# Patient Record
Sex: Male | Born: 1940
Health system: Southern US, Community
[De-identification: ages and names within clinical notes are randomized; demographics above are authoritative.]

## PROBLEM LIST (undated history)

## (undated) DIAGNOSIS — I10 Essential (primary) hypertension: Secondary | ICD-10-CM

## (undated) DIAGNOSIS — E785 Hyperlipidemia, unspecified: Secondary | ICD-10-CM

## (undated) DIAGNOSIS — I639 Cerebral infarction, unspecified: Secondary | ICD-10-CM

## (undated) DIAGNOSIS — R0683 Snoring: Secondary | ICD-10-CM

## (undated) DIAGNOSIS — I4891 Unspecified atrial fibrillation: Secondary | ICD-10-CM

## (undated) DIAGNOSIS — Z9289 Personal history of other medical treatment: Secondary | ICD-10-CM

## (undated) DIAGNOSIS — C44211 Basal cell carcinoma of skin of unspecified ear and external auricular canal: Secondary | ICD-10-CM

## (undated) DIAGNOSIS — G459 Transient cerebral ischemic attack, unspecified: Secondary | ICD-10-CM

## (undated) DIAGNOSIS — I499 Cardiac arrhythmia, unspecified: Secondary | ICD-10-CM

## (undated) DIAGNOSIS — I4892 Unspecified atrial flutter: Secondary | ICD-10-CM

## (undated) DIAGNOSIS — I2510 Atherosclerotic heart disease of native coronary artery without angina pectoris: Secondary | ICD-10-CM

## (undated) DIAGNOSIS — IMO0001 Reserved for inherently not codable concepts without codable children: Secondary | ICD-10-CM

## (undated) DIAGNOSIS — M1990 Unspecified osteoarthritis, unspecified site: Secondary | ICD-10-CM

## (undated) HISTORY — DX: Hyperlipidemia, unspecified: E78.5

## (undated) HISTORY — DX: Transient cerebral ischemic attack, unspecified: G45.9

## (undated) HISTORY — DX: Essential (primary) hypertension: I10

## (undated) HISTORY — DX: Unspecified atrial fibrillation: I48.91

## (undated) HISTORY — DX: Basal cell carcinoma of skin of unspecified ear and external auricular canal: C44.211

## (undated) HISTORY — PX: COLONOSCOPY: SHX174

## (undated) HISTORY — PX: CATARACT EXTRACTION, BILATERAL: SHX1313

## (undated) HISTORY — PX: OTHER SURGICAL HISTORY: SHX169

## (undated) HISTORY — DX: Unspecified atrial flutter: I48.92

## (undated) HISTORY — PX: KNEE ARTHROSCOPY: SUR90

## (undated) HISTORY — DX: Personal history of other medical treatment: Z92.89

## (undated) HISTORY — DX: Atherosclerotic heart disease of native coronary artery without angina pectoris: I25.10

## (undated) HISTORY — PX: EYE SURGERY: SHX253

## (undated) HISTORY — PX: HEMORRHOID SURGERY: SHX153

## (undated) HISTORY — DX: Snoring: R06.83

---

## 2001-05-30 ENCOUNTER — Encounter: Admission: RE | Admit: 2001-05-30 | Discharge: 2001-05-30 | Payer: Self-pay | Admitting: Family Medicine

## 2001-05-30 ENCOUNTER — Encounter: Payer: Self-pay | Admitting: Family Medicine

## 2001-06-04 ENCOUNTER — Ambulatory Visit (HOSPITAL_COMMUNITY): Admission: RE | Admit: 2001-06-04 | Discharge: 2001-06-04 | Payer: Self-pay | Admitting: Family Medicine

## 2004-09-28 ENCOUNTER — Inpatient Hospital Stay (HOSPITAL_COMMUNITY): Admission: EM | Admit: 2004-09-28 | Discharge: 2004-09-30 | Payer: Self-pay | Admitting: Emergency Medicine

## 2004-09-29 ENCOUNTER — Encounter (INDEPENDENT_AMBULATORY_CARE_PROVIDER_SITE_OTHER): Payer: Self-pay | Admitting: Cardiology

## 2005-01-07 ENCOUNTER — Emergency Department (HOSPITAL_COMMUNITY): Admission: EM | Admit: 2005-01-07 | Discharge: 2005-01-07 | Payer: Self-pay | Admitting: Emergency Medicine

## 2007-10-02 ENCOUNTER — Ambulatory Visit (HOSPITAL_COMMUNITY): Admission: RE | Admit: 2007-10-02 | Discharge: 2007-10-02 | Payer: Self-pay | Admitting: Cardiology

## 2008-12-19 ENCOUNTER — Inpatient Hospital Stay (HOSPITAL_COMMUNITY): Admission: EM | Admit: 2008-12-19 | Discharge: 2008-12-23 | Payer: Self-pay | Admitting: Emergency Medicine

## 2008-12-19 ENCOUNTER — Ambulatory Visit: Payer: Self-pay | Admitting: Internal Medicine

## 2008-12-22 ENCOUNTER — Encounter: Payer: Self-pay | Admitting: Cardiovascular Disease

## 2009-02-03 DIAGNOSIS — I10 Essential (primary) hypertension: Secondary | ICD-10-CM | POA: Insufficient documentation

## 2009-02-03 DIAGNOSIS — I480 Paroxysmal atrial fibrillation: Secondary | ICD-10-CM | POA: Insufficient documentation

## 2009-02-03 DIAGNOSIS — I4892 Unspecified atrial flutter: Secondary | ICD-10-CM

## 2009-02-05 ENCOUNTER — Ambulatory Visit: Payer: Self-pay | Admitting: Internal Medicine

## 2009-10-25 ENCOUNTER — Telehealth (INDEPENDENT_AMBULATORY_CARE_PROVIDER_SITE_OTHER): Payer: Self-pay | Admitting: *Deleted

## 2010-01-29 ENCOUNTER — Emergency Department (HOSPITAL_COMMUNITY): Admission: EM | Admit: 2010-01-29 | Discharge: 2010-01-29 | Payer: Self-pay | Admitting: Emergency Medicine

## 2010-06-01 ENCOUNTER — Ambulatory Visit (HOSPITAL_COMMUNITY)
Admission: RE | Admit: 2010-06-01 | Discharge: 2010-06-01 | Payer: Self-pay | Source: Home / Self Care | Attending: Otolaryngology | Admitting: Otolaryngology

## 2010-06-06 LAB — HEPATIC FUNCTION PANEL
ALT: 20 U/L (ref 0–53)
AST: 16 U/L (ref 0–37)
Albumin: 4.3 g/dL (ref 3.5–5.2)
Alkaline Phosphatase: 50 U/L (ref 39–117)
Bilirubin, Direct: 0.2 mg/dL (ref 0.0–0.3)
Indirect Bilirubin: 0.9 mg/dL (ref 0.3–0.9)
Total Bilirubin: 1.1 mg/dL (ref 0.3–1.2)
Total Protein: 7 g/dL (ref 6.0–8.3)

## 2010-06-06 LAB — SURGICAL PCR SCREEN
MRSA, PCR: NEGATIVE
Staphylococcus aureus: NEGATIVE

## 2010-06-06 LAB — BASIC METABOLIC PANEL
BUN: 15 mg/dL (ref 6–23)
BUN: 17 mg/dL (ref 6–23)
CO2: 31 mEq/L (ref 19–32)
CO2: 27 mEq/L (ref 19–32)
Calcium: 10.2 mg/dL (ref 8.4–10.5)
Calcium: 9.5 mg/dL (ref 8.4–10.5)
Chloride: 102 mEq/L (ref 96–112)
Chloride: 102 mEq/L (ref 96–112)
Creatinine, Ser: 1.18 mg/dL (ref 0.4–1.5)
Creatinine, Ser: 1.2 mg/dL (ref 0.4–1.5)
GFR calc Af Amer: >60 mL/min (ref >60)
GFR calc Af Amer: >60 mL/min (ref >60)
GFR calc non Af Amer: >60 mL/min (ref >60)
GFR calc non Af Amer: >60 mL/min (ref >60)
Glucose, Bld: 78 mg/dL (ref 70–99)
Glucose, Bld: 109 mg/dL — ABNORMAL HIGH (ref 70–99)
Potassium: 5.9 mEq/L — ABNORMAL HIGH (ref 3.5–5.1)
Potassium: 3.9 mEq/L (ref 3.5–5.1)
Sodium: 139 mEq/L (ref 135–145)
Sodium: 139 mEq/L (ref 135–145)

## 2010-06-06 LAB — CBC
HCT: 46 % (ref 39.0–52.0)
Hemoglobin: 15.2 g/dL (ref 13.0–17.0)
MCH: 31.9 pg (ref 26.0–34.0)
MCHC: 33 g/dL (ref 30.0–36.0)
MCV: 96.6 fL (ref 78.0–100.0)
Platelets: 263 10*3/uL (ref 150–400)
RBC: 4.76 MIL/uL (ref 4.22–5.81)
RDW: 13 % (ref 11.5–15.5)
WBC: 6.1 10*3/uL (ref 4.0–10.5)

## 2010-06-09 NOTE — Op Note (Signed)
NAMEANTRELL, Erik [name]              ACCOUNT NO.:  [id number]  MEDICAL RECORD NO.:  [id number]          PATIENT TYPE:  AMB  LOCATION:  SDS                          FACILITY:  [facility]  PHYSICIAN:  Zola Button T. Lazarus Salines, M.D. DATE OF BIRTH:  1940/06/01  DATE OF PROCEDURE:  06/01/2010 DATE OF DISCHARGE:  06/01/2010                              OPERATIVE REPORT   PREOPERATIVE DIAGNOSIS:  Basal cell carcinoma, right ear.  POSTOPERATIVE DIAGNOSIS:  Basal cell carcinoma, right ear.  PROCEDURE PERFORMED:  Multiple stage reexcision, basal cell carcinoma, right ear.  SURGEON:  Gloris Manchester. Raliegh Scobie, MD  ANESTHESIA:  General orotracheal.  BLOOD LOSS:  Minimal.  COMPLICATIONS:  None.  FINDINGS:  A flat well-healed area consistent with a prior excision with healing by secondary intention in the inferior right conchal bowl.  At the original excision, positive margins were noted hence the indication for today's re-excision.  A generous re-excision was performed through skin cartilage and perichondrium.  Frozen section margins were sent at 11 o'clock, 8 o'clock and 4 o'clock.  The margins were positive at 11 o'clock and 4 o'clock and re-excision were performed at both of these sites.  A repeat margin at 11 and 4 o'clock was negative at 11 and positive 4.  Another re-excision margin was obtained and additional frozen section margins were obtained at 3 o'clock and 4 o'clock, both of which were negative.  PROCEDURE:  With the patient in a comfortable supine position, general orotracheal anesthesia was induced without difficulty.  At an appropriate level, the patient was placed in a slight sitting position with the head rotated to the left for access to the right ear.  A routine time-out was performed and the identifying initials were noted. Xylocaine 1% with 1:100,000 epinephrine, 4.5 mL total was infiltrated deep to the conchal bowl for intraoperative hemostasis.  Several minutes were allowed for this  to take effect.  A sterile preparation and draping with chlorhexidine soap was performed in the standard fashion.  The findings were as described above.  The visible lesion was excised with approximately 6-mm margin at all directions carried down through skin, perichondrium cartilage and through the second layer perichondrium into the deep soft tissues.  The specimen was tagged with orienting sutures and was removed and sent off for permanent interpretation.  Thin frozen section margins at the skin edges were obtained at the 11 o'clock, 8 o'clock and 4 o'clock positions.  These returned as positive at 11 and 4 o'clock and another 4-5 mm excision margin was taken at both of these areas.  Additional frozen section margins were obtained negative at 11 and once again positive at 4 o'clock.  An additional 3-4 mm was excised in this area and two additional frozen section margins at 3 and 4 o'clock position were sampled and were negative by frozen section.  This completed the excision.  The cartilage edges were trimmed to allow more rapid healing. Electrocautery was used on one small arterial pumper at the anterior inferior conchal bowl and on several small oozing sites.  Hemostasis was observed.  The edges of the wound were tucked together in several areas with  buried 4-0 chromic suture, again to encourage more prompt healing. The final defect was roughly 3 x 3 cm.  Decision was made to go ahead and allow this heal by secondary intention again.  Adaptic gauze was placed against the raw surfaces and completely covered with bacitracin ointment.  A cotton ball was placed on top of this to catch any drainage.  At this point, the procedure was completed.  The patient was returned to Anesthesia, awakened, extubated, and transferred to recovery in stable condition.  COMMENTS:  A 70 year old white male status post a primary excision of a biopsy-proven basal cell carcinoma of the right conchal bowl  in the office.  With positive margins, it was elected to come back to the main OR under general anesthesia, at that time, we would be allowed to do frozen section sampling as needed to achieve a complete excision.  This was accomplished as above.  We will await the final pathologic interpretation.  Once again, we will allow this to heal by secondary intention with routine attention to analgesics and wound hygiene.     Gloris Manchester. Lazarus Salines, M.D.     KTW/MEDQ  D:  06/01/2010  T:  06/02/2010  Job:  [id number]  cc:   L. Lupe Carney, M.D.  Electronically Signed by Flo Shanks M.D. on 06/09/2010 12:11:17 PM

## 2010-06-21 NOTE — Progress Notes (Signed)
  Phone Note From Other Clinic   Caller: Wanda[facility] Initial call taken by: KM    Faxed Ablation over to [phone] [facility]  October 25, 2009 3:15 PM

## 2010-07-19 ENCOUNTER — Encounter: Payer: Self-pay | Admitting: Internal Medicine

## 2010-07-26 ENCOUNTER — Telehealth (INDEPENDENT_AMBULATORY_CARE_PROVIDER_SITE_OTHER): Payer: Self-pay | Admitting: *Deleted

## 2010-07-26 ENCOUNTER — Encounter (HOSPITAL_COMMUNITY)
Admission: RE | Admit: 2010-07-26 | Discharge: 2010-07-26 | Disposition: A | Discharge status: Home / Self Care | Payer: Medicare Other | Source: Ambulatory Visit | Attending: Otolaryngology | Admitting: Otolaryngology

## 2010-07-26 DIAGNOSIS — R9431 Abnormal electrocardiogram [ECG] [EKG]: Secondary | ICD-10-CM | POA: Insufficient documentation

## 2010-07-26 DIAGNOSIS — Z01812 Encounter for preprocedural laboratory examination: Secondary | ICD-10-CM | POA: Insufficient documentation

## 2010-07-26 DIAGNOSIS — Z01810 Encounter for preprocedural cardiovascular examination: Secondary | ICD-10-CM | POA: Insufficient documentation

## 2010-07-26 LAB — COMPREHENSIVE METABOLIC PANEL
ALT: 20 U/L (ref 0–53)
AST: 25 U/L (ref 0–37)
Albumin: 3.8 g/dL (ref 3.5–5.2)
Alkaline Phosphatase: 50 U/L (ref 39–117)
BUN: 19 mg/dL (ref 6–23)
CO2: 28 mEq/L (ref 19–32)
Calcium: 9.5 mg/dL (ref 8.4–10.5)
Chloride: 103 mEq/L (ref 96–112)
Creatinine, Ser: 1.07 mg/dL (ref 0.4–1.5)
GFR calc Af Amer: >60 mL/min (ref >60)
GFR calc non Af Amer: >60 mL/min (ref >60)
Glucose, Bld: 95 mg/dL (ref 70–99)
Potassium: 5.5 mEq/L — ABNORMAL HIGH (ref 3.5–5.1)
Sodium: 138 mEq/L (ref 135–145)
Total Bilirubin: 1.7 mg/dL — ABNORMAL HIGH (ref 0.3–1.2)
Total Protein: 6.8 g/dL (ref 6.0–8.3)

## 2010-07-26 LAB — SURGICAL PCR SCREEN
MRSA, PCR: NEGATIVE
Staphylococcus aureus: NEGATIVE

## 2010-07-26 LAB — CBC
HCT: 42.1 % (ref 39.0–52.0)
Hemoglobin: 14.8 g/dL (ref 13.0–17.0)
MCH: 33.5 pg (ref 26.0–34.0)
MCHC: 35.2 g/dL (ref 30.0–36.0)
MCV: 95.2 fL (ref 78.0–100.0)
Platelets: 269 10*3/uL (ref 150–400)
RBC: 4.42 MIL/uL (ref 4.22–5.81)
RDW: 12.3 % (ref 11.5–15.5)
WBC: 8.4 10*3/uL (ref 4.0–10.5)

## 2010-07-28 ENCOUNTER — Ambulatory Visit (HOSPITAL_COMMUNITY)
Admission: RE | Admit: 2010-07-28 | Discharge: 2010-07-28 | Disposition: A | Discharge status: Home / Self Care | Payer: Medicare Other | Source: Ambulatory Visit | Attending: Otolaryngology | Admitting: Otolaryngology

## 2010-07-28 ENCOUNTER — Other Ambulatory Visit: Payer: Self-pay | Admitting: Otolaryngology

## 2010-07-28 DIAGNOSIS — F1010 Alcohol abuse, uncomplicated: Secondary | ICD-10-CM | POA: Insufficient documentation

## 2010-07-28 DIAGNOSIS — I10 Essential (primary) hypertension: Secondary | ICD-10-CM | POA: Insufficient documentation

## 2010-07-28 DIAGNOSIS — K219 Gastro-esophageal reflux disease without esophagitis: Secondary | ICD-10-CM | POA: Insufficient documentation

## 2010-07-28 DIAGNOSIS — C44211 Basal cell carcinoma of skin of unspecified ear and external auricular canal: Secondary | ICD-10-CM | POA: Insufficient documentation

## 2010-07-28 DIAGNOSIS — I4892 Unspecified atrial flutter: Secondary | ICD-10-CM | POA: Insufficient documentation

## 2010-07-28 LAB — BASIC METABOLIC PANEL
BUN: 16 mg/dL (ref 6–23)
CO2: 27 mEq/L (ref 19–32)
Calcium: 9.6 mg/dL (ref 8.4–10.5)
Chloride: 106 mEq/L (ref 96–112)
Creatinine, Ser: 1.14 mg/dL (ref 0.4–1.5)
GFR calc Af Amer: >60 mL/min (ref >60)
GFR calc non Af Amer: >60 mL/min (ref >60)
Glucose, Bld: 99 mg/dL (ref 70–99)
Potassium: 4.9 mEq/L (ref 3.5–5.1)
Sodium: 139 mEq/L (ref 135–145)

## 2010-08-02 NOTE — Op Note (Signed)
Erik [name], Erik [name]              ACCOUNT NO.:  [id number]  MEDICAL RECORD NO.:  [id number]           PATIENT TYPE:  O  LOCATION:  SDSC                         FACILITY:  [facility]  PHYSICIAN:  Sekai Nayak T. Lazarus Salines, M.D. DATE OF BIRTH:  1940-07-25  DATE OF PROCEDURE:  07/28/2010 DATE OF DISCHARGE:                              OPERATIVE REPORT   PREOPERATIVE DIAGNOSIS:  Residual basal cell carcinoma, right pinna.  POSTOPERATIVE DIAGNOSIS:  Residual basal cell carcinoma, right pinna.  PROCEDURE PERFORMED:  Serial re-excision, complex, right conchal bowl lesion with island flap reconstruction.  One-stage was performed today.  SURGEON:  Gloris Manchester. Cherylin Waguespack, MD  ANESTHESIA:  General orotracheal.  BLOOD LOSS:  Minimal.  COMPLICATIONS:  None.  FINDINGS:  Old scar secondary to secondary intention healing in the inferior conchal bowl.  No obvious tumor.  Frozen sections at the margin of the excision at the anterior and posterior skin margins and in several places at the deep margins, all were read as negative.  The permanent final specimen was sent for permanent interpretation, which is pending.  A through-and-through excision from the conchal bowl to the postauricular skin, closed with an island flap.  PROCEDURE:  With the patient in a comfortable supine position, general orotracheal anesthesia was induced without difficulty.  At an appropriate level, the patient was turned 90 degrees away from anesthesia and placed in a semi-sitting position.  Xylocaine 0.5% with 1:200,000 epinephrine, 8 mL total was infiltrated beneath the conchal bowl for intraoperative anesthesia and hemostasis.  Several minutes were allowed for this to take effect.  A sterile preparation and draping of the right ear was accomplished in the standard fashion.  The previous pathology imaging maps were studied.  The lesion itself was studied with no gross recurrent tumor.  The excision was planned and superior and  inferior skin margins were harvested for frozen section. The excision was carried down just below the tragus across the incisura intertragica up to and including the entire antitragus and down into the flesh of the lobule and carried down into the conchal bowl through the cartilage.  The excision was extended through the posterior skin. Margins were taken deeply at the skin and at the soft tissue anteriorly. The specimen was marked with Vicryl sutures and sent for permanent interpretation.  Upon recognizing that there was a full-thickness excision, the posterior skin surface was trimmed up and skin margins were also sent for frozen section.  There was miscommunication between the surgical suite and the pathology suite and the specimens were not initially processed for frozen section. This was recognized and remedied.  After approximately 1 hour and 15 minutes, frozen sections finally did return as all benign.  Hemostasis was observed at this point.  A preparations were made for closure.  It was felt that the anterior portion was going to be left to heal by secondary intention as it approached the external meatus.  More posteriorly, the skin island was planned on the postauricular skin undermined around its edges.  The skin island was sutured to the anterior surface of the pinna circumferentially approximately 2 cm in diameter.  The postauricular  skin on the pinna was then sewn down to the postauricular skin over the mastoid again with 4-0 chromic suture.  This tethered the inferior conchal bowl to some degree, but a good closure with no perforation was accomplished.  An area approximately 1.5 x 0.5-cm was left to close by secondary intention just outside of the external meatus.  Small amount of cautery was used for hemostasis.  At this point, the procedure was completed.  Bacitracin ointment was applied and the cotton ball was placed in the conchal bowl as well as some bacitracin  ointment posteriorly.  The drapes were removed and the patient was returned to Anesthesia, awakened, extubated, and transferred to recovery in stable condition.  COMMENT:  A 70 year old white male with biopsy-proven basal cell carcinoma, underwent a serial excision 2 months ago going through three stages and finally achieving negative frozen section margins.  However, permanent section margin was returned positive at the 4 o'clock position necessitating today's re-excision.  He has a basically morpheaform basal cell carcinoma.  A single excision today with multiple frozen section controls was accomplished and all frozen sections were negative with the describer as above.  Anticipate routine postoperative recovery with attention to ice, elevation, analgesia, and wound care.  Given low anticipated risk of postanesthetic or postsurgical complications, feeling outpatient venue is appropriate.     Gloris Manchester. Lazarus Salines, M.D.     KTW/MEDQ  D:  07/28/2010  T:  07/29/2010  Job:  [id number]  cc:   Doylene Canning. Ladona Ridgel, MD L. Lupe Carney, M.D.  Electronically Signed by Flo Shanks M.D. on 08/02/2010 08:04:33 AM

## 2010-08-02 NOTE — Letter (Signed)
Summary: [facility]  [facility]   Imported By: Marylou Mccoy 07/29/2010 08:41:28  _____________________________________________________________________  External Attachment:    Type:   Image     Comment:   External Document

## 2010-08-02 NOTE — Progress Notes (Signed)
Summary: Records Request  Faxed OV & EKG to [facility] at [facility] ([phone]). Debby Freiberg  July 26, 2010 12:40 PM

## 2010-08-04 LAB — POCT I-STAT, CHEM 8
BUN: 19 mg/dL (ref 6–23)
Calcium, Ion: 1.16 mmol/L (ref 1.12–1.32)
Chloride: 104 mEq/L (ref 96–112)
Creatinine, Ser: 1.4 mg/dL (ref 0.4–1.5)
Glucose, Bld: 112 mg/dL — ABNORMAL HIGH (ref 70–99)
HCT: 45 % (ref 39.0–52.0)
Hemoglobin: 15.3 g/dL (ref 13.0–17.0)
Potassium: 5 mEq/L (ref 3.5–5.1)
Sodium: 138 mEq/L (ref 135–145)
TCO2: 30 mmol/L (ref 0–100)

## 2010-08-04 LAB — POCT CARDIAC MARKERS
CKMB, poc: 2.4 ng/mL (ref 1.0–8.0)
Myoglobin, poc: 79.3 ng/mL (ref 12–200)
Troponin i, poc: <0.05 ng/mL (ref 0.00–0.09)

## 2010-08-04 LAB — MAGNESIUM: Magnesium: 1.8 mg/dL (ref 1.5–2.5)

## 2010-08-27 LAB — BASIC METABOLIC PANEL
BUN: 18 mg/dL (ref 6–23)
CO2: 30 mEq/L (ref 19–32)
Calcium: 8.7 mg/dL (ref 8.4–10.5)
Chloride: 103 mEq/L (ref 96–112)
Creatinine, Ser: 1.3 mg/dL (ref 0.4–1.5)
GFR calc Af Amer: >60 mL/min (ref >60)
GFR calc non Af Amer: 55 mL/min — ABNORMAL LOW (ref >60)
Glucose, Bld: 112 mg/dL — ABNORMAL HIGH (ref 70–99)
Potassium: 4.8 mEq/L (ref 3.5–5.1)
Sodium: 137 mEq/L (ref 135–145)

## 2010-08-27 LAB — CBC
HCT: 35.5 % — ABNORMAL LOW (ref 39.0–52.0)
HCT: 40.9 % (ref 39.0–52.0)
Hemoglobin: 12.3 g/dL — ABNORMAL LOW (ref 13.0–17.0)
Hemoglobin: 14.5 g/dL (ref 13.0–17.0)
MCHC: 34.8 g/dL (ref 30.0–36.0)
MCHC: 35.4 g/dL (ref 30.0–36.0)
MCV: 98.9 fL (ref 78.0–100.0)
MCV: 99.8 fL (ref 78.0–100.0)
Platelets: 222 10*3/uL (ref 150–400)
Platelets: 275 10*3/uL (ref 150–400)
RBC: 3.56 MIL/uL — ABNORMAL LOW (ref 4.22–5.81)
RBC: 4.14 MIL/uL — ABNORMAL LOW (ref 4.22–5.81)
RDW: 13.2 % (ref 11.5–15.5)
RDW: 13.3 % (ref 11.5–15.5)
WBC: 7.1 10*3/uL (ref 4.0–10.5)
WBC: 7.8 10*3/uL (ref 4.0–10.5)

## 2010-08-27 LAB — CARDIAC PANEL(CRET KIN+CKTOT+MB+TROPI)
CK, MB: 2.7 ng/mL (ref 0.3–4.0)
CK, MB: 2.7 ng/mL (ref 0.3–4.0)
Relative Index: INVALID (ref 0.0–2.5)
Relative Index: INVALID (ref 0.0–2.5)
Total CK: 86 U/L (ref 7–232)
Total CK: 91 U/L (ref 7–232)
Troponin I: 0.01 ng/mL (ref 0.00–0.06)
Troponin I: 0.02 ng/mL (ref 0.00–0.06)

## 2010-08-27 LAB — PROTIME-INR
INR: 1 (ref 0.00–1.49)
Prothrombin Time: 13 seconds (ref 11.6–15.2)

## 2010-08-28 LAB — POCT CARDIAC MARKERS: Troponin i, poc: <0.05 ng/mL (ref 0.00–0.09)

## 2010-08-28 LAB — CBC
HCT: 47.4 % (ref 39.0–52.0)
Hemoglobin: 16.5 g/dL (ref 13.0–17.0)
MCHC: 34.8 g/dL (ref 30.0–36.0)
RDW: 13.5 % (ref 11.5–15.5)

## 2010-08-28 LAB — TSH: TSH: 0.977 u[IU]/mL (ref 0.350–4.500)

## 2010-08-28 LAB — DIFFERENTIAL
Basophils Absolute: 0 10*3/uL (ref 0.0–0.1)
Basophils Relative: 1 % (ref 0–1)
Eosinophils Absolute: 0.1 10*3/uL (ref 0.0–0.7)
Eosinophils Relative: 1 % (ref 0–5)
Monocytes Absolute: 0.5 10*3/uL (ref 0.1–1.0)

## 2010-08-28 LAB — TROPONIN I: Troponin I: 0.02 ng/mL (ref 0.00–0.06)

## 2010-08-28 LAB — BASIC METABOLIC PANEL
BUN: 17 mg/dL (ref 6–23)
CO2: 25 mEq/L (ref 19–32)
Calcium: 9.3 mg/dL (ref 8.4–10.5)
Chloride: 101 mEq/L (ref 96–112)
Creatinine, Ser: 1.08 mg/dL (ref 0.4–1.5)
GFR calc Af Amer: >60 mL/min (ref >60)
GFR calc non Af Amer: >60 mL/min (ref >60)
Glucose, Bld: 133 mg/dL — ABNORMAL HIGH (ref 70–99)
Potassium: 4.1 mEq/L (ref 3.5–5.1)
Sodium: 134 mEq/L — ABNORMAL LOW (ref 135–145)

## 2010-08-28 LAB — D-DIMER, QUANTITATIVE: D-Dimer, Quant: <0.22 ug/mL-FEU (ref 0.00–0.48)

## 2010-08-28 LAB — CK TOTAL AND CKMB (NOT AT ARMC): CK, MB: 3.3 ng/mL (ref 0.3–4.0)

## 2010-08-28 LAB — PROTIME-INR: Prothrombin Time: 13.2 seconds (ref 11.6–15.2)

## 2010-09-21 ENCOUNTER — Encounter: Payer: Self-pay | Admitting: Internal Medicine

## 2010-09-22 ENCOUNTER — Encounter: Payer: Self-pay | Admitting: Internal Medicine

## 2010-09-22 ENCOUNTER — Ambulatory Visit (INDEPENDENT_AMBULATORY_CARE_PROVIDER_SITE_OTHER): Payer: Medicare Other | Admitting: Internal Medicine

## 2010-09-22 VITALS — BP 140/80 | HR 72 | Ht 71.0 in | Wt 208.0 lb

## 2010-09-22 DIAGNOSIS — I10 Essential (primary) hypertension: Secondary | ICD-10-CM

## 2010-09-22 DIAGNOSIS — R0789 Other chest pain: Secondary | ICD-10-CM

## 2010-09-22 DIAGNOSIS — I4892 Unspecified atrial flutter: Secondary | ICD-10-CM

## 2010-09-22 DIAGNOSIS — I4891 Unspecified atrial fibrillation: Secondary | ICD-10-CM

## 2010-09-22 NOTE — Progress Notes (Signed)
HPI Mr. Bufkin returns today for followup. He has a history of atrial flutter and is status post catheter ablation. He has atrial flutter ablation which has been fairly well controlled on Rhythmol. He also has hypertension for which he is on medical therapy. The patient complains of increasing dyspnea with exertion. He also has intermittent chest discomfort. He denies syncope. He states that he is unable to do as much activity because of the above symptoms. Today he brings a lot with him which demonstrates that he is out of rhythm some by his report.The patient thinks that his atrial fibrillation has been less now that he is not drinking as much alcohol. No Known Allergies   Current Outpatient Prescriptions  Medication Sig Dispense Refill  . amLODipine (NORVASC) 5 MG tablet Take 5 mg by mouth daily.        Marland Kitchen aspirin 81 MG chewable tablet Chew 81 mg by mouth daily.        Marland Kitchen diltiazem (CARDIZEM) 60 MG tablet Take 60 mg by mouth 3 (three) times daily as needed.        Marland Kitchen lisinopril-hydrochlorothiazide (PRINZIDE,ZESTORETIC) 20-12.5 MG per tablet Take 1 tablet by mouth daily.        Marland Kitchen omeprazole (PRILOSEC) 20 MG capsule Take 20 mg by mouth daily.        . propafenone (RYTHMOL) 225 MG tablet Take 225 mg by mouth every 8 (eight) hours.           Past Medical History  Diagnosis Date  . Atrial flutter   . Dyslipidemia   . Hypertension   . Atrial fibrillation     ROS:   All systems reviewed and negative except as noted in the HPI.   Past Surgical History  Procedure Date  . Knee arthroscopy     bilateral  . Atrial flutter ablation   . Atrial ablation surgery     12/22/2008     No family history on file.   History   Social History  . Marital Status: Married    Spouse Name: N/A    Number of Children: N/A  . Years of Education: N/A   Occupational History  . Not on file.   Social History Main Topics  . Smoking status: Never Smoker   . Smokeless tobacco: Not on file  . Alcohol  Use: 1.0 oz/week    2 drink(s) per week     2 drinks per night  . Drug Use: No  . Sexually Active: Not on file   Other Topics Concern  . Not on file   Social History Narrative  . No narrative on file     BP 140/80  Pulse 72  Ht 5\' 11"  (1.803 m)  Wt 208 lb (94.348 kg)  BMI 29.01 kg/m2  Physical Exam:  Well appearing NAD HEENT: Unremarkable Neck:  No JVD, no thyromegally Lymphatics:  No adenopathy Back:  No CVA tenderness Lungs:  Clear HEART:  Regular bradycardia, no murmurs, no rubs, no clicks Abd:  Flat, positive bowel sounds, no organomegally, no rebound, no guarding Ext:  2 plus pulses, no edema, no cyanosis, no clubbing Skin:  No rashes no nodules Neuro:  CN II through XII intact, motor grossly intact  EKG Sinus bradycardia. Nonspecific T wave abnormality.   Assess/Plan:

## 2010-09-22 NOTE — Assessment & Plan Note (Signed)
He does not appear to have had any symptomatic atrial flutter since his last ablation. Will follow.

## 2010-09-22 NOTE — Assessment & Plan Note (Signed)
His blood pressure appears to be fairly well controlled. I have asked that he maintain a low-salt diet.

## 2010-09-22 NOTE — Patient Instructions (Signed)
Your physician has requested that you have en exercise stress myoview. For further information please visit [email or web address] Please follow instruction sheet, as given.  Will base follow up on study

## 2010-09-22 NOTE — Assessment & Plan Note (Signed)
This appears to be a new problem. I've asked that the patient undergo exercise stress testing. It's been over 6 years since he has had this performed. Because of his antiarrhythmic drug therapy, I do not think a standard stress test will be of value as his medications will affect the results of ischemia evaluation during regular stress testing

## 2010-09-22 NOTE — Assessment & Plan Note (Signed)
He appears to be maintaining sinus rhythm for the most part. He does have symptomatic breakthroughs of atrial fibrillation. He ultimately may require a switch to a different antiarrhythmic drug.

## 2010-09-28 ENCOUNTER — Inpatient Hospital Stay (HOSPITAL_COMMUNITY)
Admission: EM | Admit: 2010-09-28 | Discharge: 2010-09-29 | DRG: 287 | Disposition: A | Discharge status: Home / Self Care | Payer: Medicare Other | Source: Ambulatory Visit | Attending: Cardiology | Admitting: Cardiology

## 2010-09-28 ENCOUNTER — Ambulatory Visit (INDEPENDENT_AMBULATORY_CARE_PROVIDER_SITE_OTHER): Payer: Medicare Other | Admitting: Physician Assistant

## 2010-09-28 ENCOUNTER — Encounter: Payer: Self-pay | Admitting: Physician Assistant

## 2010-09-28 ENCOUNTER — Ambulatory Visit (HOSPITAL_BASED_OUTPATIENT_CLINIC_OR_DEPARTMENT_OTHER): Payer: Medicare Other | Admitting: Radiology

## 2010-09-28 ENCOUNTER — Inpatient Hospital Stay (HOSPITAL_COMMUNITY): Payer: Medicare Other

## 2010-09-28 ENCOUNTER — Encounter: Payer: Self-pay | Admitting: Internal Medicine

## 2010-09-28 VITALS — BP 152/78 | HR 48 | Ht 71.0 in | Wt 208.0 lb

## 2010-09-28 DIAGNOSIS — K219 Gastro-esophageal reflux disease without esophagitis: Secondary | ICD-10-CM | POA: Diagnosis present

## 2010-09-28 DIAGNOSIS — E785 Hyperlipidemia, unspecified: Secondary | ICD-10-CM | POA: Diagnosis present

## 2010-09-28 DIAGNOSIS — I4891 Unspecified atrial fibrillation: Secondary | ICD-10-CM | POA: Diagnosis present

## 2010-09-28 DIAGNOSIS — Z7982 Long term (current) use of aspirin: Secondary | ICD-10-CM

## 2010-09-28 DIAGNOSIS — Z85828 Personal history of other malignant neoplasm of skin: Secondary | ICD-10-CM

## 2010-09-28 DIAGNOSIS — I4892 Unspecified atrial flutter: Secondary | ICD-10-CM

## 2010-09-28 DIAGNOSIS — R0789 Other chest pain: Secondary | ICD-10-CM

## 2010-09-28 DIAGNOSIS — I10 Essential (primary) hypertension: Secondary | ICD-10-CM

## 2010-09-28 DIAGNOSIS — R9439 Abnormal result of other cardiovascular function study: Secondary | ICD-10-CM | POA: Insufficient documentation

## 2010-09-28 DIAGNOSIS — R0602 Shortness of breath: Secondary | ICD-10-CM | POA: Diagnosis present

## 2010-09-28 DIAGNOSIS — R0989 Other specified symptoms and signs involving the circulatory and respiratory systems: Secondary | ICD-10-CM

## 2010-09-28 LAB — CARDIAC PANEL(CRET KIN+CKTOT+MB+TROPI)
CK, MB: 5.1 ng/mL — ABNORMAL HIGH (ref 0.3–4.0)
Relative Index: 3 — ABNORMAL HIGH (ref 0.0–2.5)

## 2010-09-28 LAB — CBC
HCT: 45.8 % (ref 39.0–52.0)
MCV: 96.4 fL (ref 78.0–100.0)
RDW: 12.3 % (ref 11.5–15.5)
WBC: 7.1 10*3/uL (ref 4.0–10.5)

## 2010-09-28 LAB — COMPREHENSIVE METABOLIC PANEL
ALT: 20 U/L (ref 0–53)
AST: 19 U/L (ref 0–37)
Albumin: 4.2 g/dL (ref 3.5–5.2)
Alkaline Phosphatase: 65 U/L (ref 39–117)
Glucose, Bld: 87 mg/dL (ref 70–99)
Potassium: 4.1 mEq/L (ref 3.5–5.1)
Sodium: 138 mEq/L (ref 135–145)
Total Protein: 7.7 g/dL (ref 6.0–8.3)

## 2010-09-28 LAB — PROTIME-INR: INR: 0.96 (ref 0.00–1.49)

## 2010-09-28 LAB — DIFFERENTIAL
Eosinophils Relative: 1 % (ref 0–5)
Lymphs Abs: 1.7 10*3/uL (ref 0.7–4.0)
Monocytes Relative: 8 % (ref 3–12)

## 2010-09-28 MED ORDER — TECHNETIUM TC 99M TETROFOSMIN IV KIT
11.0000 | PACK | Freq: Once | INTRAVENOUS | Status: AC | PRN
  Administered 2010-09-28: 11 via INTRAVENOUS

## 2010-09-28 MED ORDER — REGADENOSON 0.4 MG/5ML IV SOLN
0.4000 mg | Freq: Once | INTRAVENOUS | Status: AC
  Administered 2010-09-28: 0.4 mg via INTRAVENOUS

## 2010-09-28 MED ORDER — TECHNETIUM TC 99M TETROFOSMIN IV KIT
33.0000 | PACK | Freq: Once | INTRAVENOUS | Status: AC | PRN
  Administered 2010-09-28: 33 via INTRAVENOUS

## 2010-09-28 NOTE — Progress Notes (Signed)
[facility] SITE 3 NUCLEAR MED [address] [place] [address] [phone]  Cardiology Nuclear Med Study  Erik [name] is a 70 y.o. male [id number] May 16, 1941   Nuclear Med Background Indication for Stress Test:  Evaluation for Ischemia History: 08/10 Ablation, 05/06 Echo: EF 55-65%  and '07 [facility] Myocardial Perfusion Study: unknown results Cardiac Risk Factors: Family History - CAD, Hypertension and Lipids  Symptoms:  Chest Pain, DOE, Fatigue, Fatigue with Exertion and Palpitations   Nuclear Pre-Procedure Caffeine/Decaff Intake:  7:00pm NPO After: 7:00pm   Lungs:  clear IV 0.9% NS with Angio Cath:  18g  IV Site: R Antecubital  IV Started by:  Stanton Kidney, EMT-P  Chest Size (in):  46 Cup Size: n/a  Height: 5\' 11"  (1.803 m)  Weight:  205 lb (92.987 kg)  BMI:  Body mass index is 28.59 kg/(m^2). Tech Comments:  This patient walked for 6:15 and had chest tightness, numbness, and joint pain 8/10 during the test. DOD Dr. Daiva Nakayama consultated and stated the patient needed to be admitted and cathed. S.[name] to finish that process.    Nuclear Med Study 1 or 2 day study: 1 day  Stress Test Type:  Treadmill/Lexiscan  Reading MD: Marca Ancona, MD  Order Authorizing Provider:  G. Ladona Ridgel, MD  Resting Radionuclide: Technetium 45m Tetrofosmin  Resting Radionuclide Dose: 11 mCi   Stress Radionuclide:  Technetium 78m Tetrofosmin  Stress Radionuclide Dose: 33 mCi           Stress Protocol Rest HR: 51 Stress HR: 93  Rest BP: 129/74 Stress BP: 159/69  Exercise Time (min): 6:15 METS: 6.7   Predicted Max HR: 151 bpm % Max HR: 60.26 bpm Rate Pressure Product: 57846   Dose of Adenosine (mg):  n/a Dose of Lexiscan: 0.4 mg  Dose of Atropine (mg): n/a Dose of Dobutamine: n/a mcg/kg/min (at max HR)  Stress Test Technologist: Milana Na, EMT-P  Nuclear Technologist:  Domenic Polite, CNMT     Rest Procedure:  Myocardial perfusion imaging was performed at  rest 45 minutes following the intravenous administration of Technetium 25m Tetrofosmin. Rest ECG: Sinus Bradycardia  Stress Procedure:  The patient received IV Lexiscan 0.4 mg over 15-seconds with concurrent low level exercise and then Technetium 38m Tetrofosmin was injected at 30-seconds while the patient continued walking one more minute.  There were no significant changes and rare pacs/ occ pvcs with Lexiscan.  Quantitative spect images were obtained after a 45-minute delay. Stress ECG: No significant change from baseline ECG  QPS Raw Data Images:  Normal; no motion artifact; normal heart/lung ratio. Stress Images:  Small apical perfusion defect.  Rest Images:  Small apical perfusion defect, less marked than stress.  Subtraction (SDS):  Small partially reversible apical perfusion defect.  Transient Ischemic Dilatation (Normal <1.22):  0.95 Lung/Heart Ratio (Normal <0.45):  0.30  Quantitative Gated Spect Images QGS EDV:  116 ml QGS ESV:  32 ml QGS cine images:  Normal wall motion. QGS EF: 72%  Impression Exercise Capacity:  Lexiscan with low level exercise. BP Response:  Normal blood pressure response. Clinical Symptoms:  Chest tightness ECG Impression:  No significant ST segment change suggestive of ischemia. Comparison with Prior Nuclear Study: No images to compare  Overall Impression:  Small partially reversible apical perfusion defect may represent a small area of ischemia.  Overall low risk study.  Patient did have chest pain with exercise but no ECG changes.   Benino Korinek [employer]

## 2010-09-28 NOTE — Assessment & Plan Note (Signed)
   Maintaining NSR. 

## 2010-09-28 NOTE — Progress Notes (Addendum)
History of Present Illness: Primary Electrophysiologist:  Dr. Lewayne [name]  Erik [name] is a 70 y.o. male with a h/o AFlutter, s/p RFCA and HTN who recently saw Dr. Ladona [name] for follow up and noted exertional chest pain and dyspnea.  He was set up for a stress myoview today.  He had poor exercise tolerance and had to be switched to a Lexiscan due to chest pain and dyspnea.  His images reveal infero-apical ischemia with EF 72%.  He is still having chest pain and dyspnea and is added on to my schedule for admission to the hospital.    He has had problems with chronic chest pain for several months now.  He does note worsening with exertion.  Most notably, he has significant DOE and really describes NYHA class 3 symptoms.  No orthopnea, PND or edema.  No syncope.  He is still mildly SOB now and is having 1/10 chest pain.  We will put him on O2 and give him SL NTG and have him admitted to [facility] today via ambulance with plans for cath tomorrow.    Past Medical History  Diagnosis Date  . Atrial flutter     s/p RFCA  . Dyslipidemia   . Hypertension   . Atrial fibrillation     Rythmol Rx  . Basal cell carcinoma of ear   . GERD (gastroesophageal reflux disease)     Past Surgical History  Procedure Date  . Knee arthroscopy     bilateral  . Atrial flutter ablation   . Basal cell cancer resection   . Hemorrhoid surgery     Current Outpatient Prescriptions  Medication Sig Dispense Refill  . amLODipine (NORVASC) 5 MG tablet Take 5 mg by mouth daily.        Marland Kitchen aspirin 81 MG chewable tablet Chew 81 mg by mouth daily.        Marland Kitchen diltiazem (CARDIZEM) 60 MG tablet Take 60 mg by mouth 3 (three) times daily as needed.        Marland Kitchen lisinopril-hydrochlorothiazide (PRINZIDE,ZESTORETIC) 20-12.5 MG per tablet Take 1 tablet by mouth daily.        Marland Kitchen omeprazole (PRILOSEC) 20 MG capsule Take 20 mg by mouth daily.        . propafenone (RYTHMOL) 225 MG tablet Take 225 mg by mouth every 8 (eight) hours.         No  current facility-administered medications for this visit.   Facility-Administered Medications Ordered in Other Visits  Medication Dose Route Frequency Provider Last Rate Last Dose  . regadenoson (LEXISCAN) injection SOLN 0.4 mg  0.4 mg Intravenous Once Erik Bunting, MD   0.4 mg at 09/28/10 1410  . technetium tetrofosmin (TC-MYOVIEW) injection 11 milli Curie  11 milli Curie Intravenous Once PRN Erik Ancona, MD   11 milli Curie at 09/28/10 1210  . technetium tetrofosmin (TC-MYOVIEW) injection 33 milli Curie  33 milli Curie Intravenous Once PRN Erik Ancona, MD   33 milli Curie at 09/28/10 1400    No Known Allergies  History  Substance Use Topics  . Smoking status: Former Games developer  . Smokeless tobacco: Never Used  . Alcohol Use: 7.0 oz/week    14 drink(s) per week     2 drinks per night    Family History  Problem Relation Age of Onset  . Hypertension Mother   . Stroke Mother     ROS:  Please see HPI.  He denies fevers, chills, cough, melena, hematochezia, vomiting, diarrhea, dysphagia or odynophagia.  All other systems reviewed and negative.  Vital Signs: BP 152/78  Pulse 48  Ht 5\' 11"  (1.803 m)  Wt 208 lb (94.348 kg)  BMI 29.01 kg/m2  SpO2 98%  PHYSICAL EXAM: Well nourished, well developed, in no acute distress HEENT: normal Neck: no JVD Endocrine: no thyromegaly Lymph: no cervical or supraclavicular adenopathy Vascular: No carotid bruits, bilat. FA 2+ without bruits, DP/PT 2+ bilat Cardiac:  normal S1, S2; RRR; no murmur Lungs:  clear to auscultation bilaterally, no wheezing, rhonchi or rales Abd: soft, nontender, no hepatomegaly, no bruits Ext: no clubbing, cyanosis or edema Skin: warm and dry Neuro:  CNs 2-12 intact, no focal abnormalities noted Psych: normal affect  EKG:  From stress test demonstrates sinus bradycardia, HR 48 without ST-T wave changes during exercise.  ASSESSMENT AND PLAN:    I evaluated this patient with Erik [name].  He had a stress test  today with inferoapical ischemia.  He has ongoing chest pain.    PE Lungs:  Clear COR:  RRR, no murmur Abd:  Positive BS  EXT:  No edema  EKG no acute ST T wave changes  Plan  Agree with all findings as above and plan below. He will be admitted for cardiac cath.  Erik [name]

## 2010-09-28 NOTE — Assessment & Plan Note (Signed)
With his abnormal stress test and ongoing symptoms this is concerning for unstable angina.  He will be placed on O2 and given SL NTG.  He will be admitted to [facility] via ambulance today with plans for cardiac cath tomorrow.  Risks and benefits of cardiac catheterization have been discussed with the patient.  These include bleeding, infection, kidney damage, stroke, heart attack, death.  The patient understands these risks and is willing to proceed.  Continue current medications.  Add high dose statin, heparin, NTG gtt.  Cycle cardiac enzymes.

## 2010-09-28 NOTE — Assessment & Plan Note (Signed)
Continue current medications. 

## 2010-09-28 NOTE — Assessment & Plan Note (Signed)
Check FLP and add statin.

## 2010-09-29 DIAGNOSIS — R079 Chest pain, unspecified: Secondary | ICD-10-CM

## 2010-09-29 LAB — CBC
HCT: 39.4 % (ref 39.0–52.0)
MCH: 33.8 pg (ref 26.0–34.0)
MCHC: 35.5 g/dL (ref 30.0–36.0)
MCV: 95.2 fL (ref 78.0–100.0)
RDW: 12.3 % (ref 11.5–15.5)

## 2010-09-29 LAB — CARDIAC PANEL(CRET KIN+CKTOT+MB+TROPI)
CK, MB: 3.9 ng/mL (ref 0.3–4.0)
Relative Index: 3.1 — ABNORMAL HIGH (ref 0.0–2.5)
Relative Index: 2.9 — ABNORMAL HIGH (ref 0.0–2.5)
Troponin I: <0.3 ng/mL (ref <0.30)
Troponin I: <0.3 ng/mL (ref <0.30)

## 2010-09-29 LAB — LIPID PANEL: VLDL: 50 mg/dL — ABNORMAL HIGH (ref 0–40)

## 2010-09-29 LAB — POCT ACTIVATED CLOTTING TIME: Activated Clotting Time: 111 seconds

## 2010-09-29 NOTE — Progress Notes (Signed)
COPY ROUTED TO DR. Ladona Ridgel.Mirna Mires

## 2010-10-04 ENCOUNTER — Encounter: Payer: Self-pay | Admitting: Internal Medicine

## 2010-10-04 NOTE — Consult Note (Signed)
Erik [name], Erik [name]              ACCOUNT NO.:  [id number]   MEDICAL RECORD NO.:  [id number]          PATIENT TYPE:  INP   LOCATION:  4712                         FACILITY:  [facility]   PHYSICIAN:  Noralyn Pick. Eden Emms, MD, FACCDATE OF BIRTH:  1941-02-24   DATE OF CONSULTATION:  12/22/2008  DATE OF DISCHARGE:                                 CONSULTATION   A 70 year old patient pre-ablation.   Transesophageal echo was done to rule out left atrial appendage clot.   The patient was sedated with 50 mcg fentanyl and 5 mg of Versed.   Using digital technique, an Omniplane probe was advanced into esophagus  without incident.  Left ventricular cavity size and function were  normal.  EF was 60%.  Mitral valve was mildly thickened with mild MR.  There was moderate left atrial enlargement, mild right atrial  enlargement, right ventricle was normal, aortic valve was trileaflet  with mild aortic insufficiency, and aortic root was normal.  The atrial  septum was intact with no evidence of PFO or ASD.  Left atrial appendage  was moderately dilated.  There was trivial spontaneous contrast.  There  was no evidence of thrombus.  Left atrial appendage was well visualized  in orthogonal planes.   Imaging of the aorta showed no significant debris.   FINAL IMPRESSION:  1. No left atrial appendage clot.  2. Normal left ventricle, ejection fraction 60%.  3. Mild mitral regurgitation.  4. Moderate left atrial enlargement and mild right atrial enlargement.  5. Mild aortic insufficiency.  6. No atrial septal defect.  7. No aortic debris.   The patient tolerated the procedure well.  He will proceed with ablation  procedure with Dr. Ladona Ridgel.      Noralyn Pick. Eden Emms, MD, [facility]  Electronically Signed     PCN/MEDQ  D:  12/22/2008  T:  12/23/2008  Job:  [id number]   cc:   Francisca December, M.D.

## 2010-10-04 NOTE — Discharge Summary (Signed)
Erik [name], Erik [name]              ACCOUNT NO.:  [id number]   MEDICAL RECORD NO.:  [id number]          PATIENT TYPE:  INP   LOCATION:  4712                         FACILITY:  [facility]   PHYSICIAN:  Francisca December, M.D.  DATE OF BIRTH:  02-Dec-1940   DATE OF ADMISSION:  12/19/2008  DATE OF DISCHARGE:  12/23/2008                               DISCHARGE SUMMARY   DISCHARGE DIAGNOSES:  1. Paroxysmal atrial flutter.  2. Paroxysmal atrial fibrillation.  3. Hypertension.  4. Dyslipidemia.  5. Status post bilateral knee arthroscopies.   COURSE IN THE HOSPITAL:  Please see the history and physical dictated  December 19, 2008, by Dr. Armanda Magic for details.  Briefly, the patient is  a 70 year old man with a history of prior paroxysmal atrial  fibrillation, who presented to an urgent care because of tachy  palpitation.  He was found to be in atrial flutter and transported to  [facility].  He was subsequently admitted for control of rapid  ventricular response and atrial flutter.  Initially, he was begun on a  Cardizem drip after a bolus of 10 mg with plans for EP consultation.   This was obtained and kindly provided by Dr. Ladona Ridgel on December 21, 2008.  The decision was made to proceed with atrial flutter ablation.  This was  performed without complication on December 22, 2008, and as of the morning  of December 23, 2008, the patient was up, ambulatory, and ready for  discharge.   PERTINENT LABORATORY DATA:  On admission, the hemoglobin was 16.5,  hematocrit 47.4, white blood cell count 7300, platelets 256,000.  Serum  electrolytes were normal.  Blood sugar 133 falling to 112.  BUN and  creatinine normal at 17 and 1.08 respectively.  Cardiac enzymes x3 were  negative.  No other significant laboratory findings.  At the time of  discharge, his hemoglobin had fallen to 12.3 g with a hematocrit of  35.5.  On December 20, 2008, it had been 14.5 and 40.9 respectively.   PROCEDURES PERFORMED:  1.  Transesophageal echocardiogram.  2. Atrial flutter ablation.   DISCHARGE MEDICATIONS:  1. Amlodipine 5 mg p.o. daily.  2. Aspirin 325 mg once daily (increased).  3. Lisinopril/hydrochlorothiazide 20/12.5 one daily.  4. Propafenone 225 mg 3 times daily.   DISCHARGE FOLLOWUP:  The patient has an appointment with myself on  January 13, 2009, at 4:30 p.m. and an appointment with Dr. Ladona Ridgel on  February 05, 2009, at 3:15 p.m.   DISCHARGE INSTRUCTIONS:  The patient is to keep the groin clean and wash  with soap and water daily.  Bandage can be removed later today or  tomorrow.  Apply Band-Aid following.  There are no restrictions on his  activities.  Avoid heavy lifting for 3-5 days that is greater than 20  pounds.      Francisca December, M.D.  Electronically Signed     JHE/MEDQ  D:  12/23/2008  T:  12/23/2008  Job:  [id number]   cc:   Doylene Canning. Ladona Ridgel, MD

## 2010-10-04 NOTE — Op Note (Signed)
Erik [name], Erik [name]              ACCOUNT NO.:  [id number]   MEDICAL RECORD NO.:  [id number]          PATIENT TYPE:  INP   LOCATION:  4712                         FACILITY:  [facility]   PHYSICIAN:  Doylene Canning. Ladona Ridgel, MD    DATE OF BIRTH:  1940/12/16   DATE OF PROCEDURE:  12/22/2008  DATE OF DISCHARGE:                               OPERATIVE REPORT   PROCEDURE PERFORMED:  Electrophysiologic study and RF catheter ablation  of atrial flutter.   INTRODUCTION:  The patient is a 70 year old man who has a history of  AFib that has been well controlled on propafenone therapy, who presented  to the hospital with atrial flutter and rapid ventricular response.  Initially rate control was attempted, but this was quite difficult  secondary to his persistent rapid ventricular response, and he is now  referred for catheter ablation.  Now the patient has undergone  transesophageal echo, which demonstrated no left atrial appendage  thrombus.   PROCEDURE:  After informed consent was obtained, the patient was taken  to the diagnostic EP lab in fasting state.  After usual preparation and  draping, intravenous fentanyl and midazolam was given for sedation along  with intravenous Valium.  A 6-French flexible catheter was inserted  percutaneously into the right jugular vein and advanced to coronary  sinus.  A 7-French 20-pole halo catheter was inserted percutaneously  into the right femoral vein and advanced to the right atrium.  A 5-  French quadripolar catheter was inserted percutaneously in the right  femoral vein and advanced to the His bundle region.  After measurement  of the basic intervals, mapping was carried out demonstrating typical  counterclockwise tricuspid annular reentrant atrial flutter.  At this  point, a 7-French quadripolar ablation catheter was maneuvered into the  atrial flutter isthmus.  A total of 7 RF energy applications were  delivered, which resulted in the termination of atrial  flutter and the  creation of bidirectional isthmus block.  The patient was observed for  approximately 45 minutes.  During this time, rapid ventricular pacing  was carried out from the RV apex demonstrating VA dissociation at 600  milliseconds.  Programmed ventricular stimulation was carried out from  the RV apex demonstrating VA dissociation at 600 milliseconds.  Rapid  atrial pacing was carried out from the coronary sinus and high right  atrium demonstrating an AV Wenckebach cycle length of 450 milliseconds.  During rapid atrial pacing, the PR interval was less than the RR  interval and there was no inducible SVT.  Finally programmed atrial  stimulation was carried out from the coronary sinus and the high right  atrium had a base drive cycle length of 478 milliseconds and stepwise  decreased down to 380 milliseconds where the AV node ERP was observed.  During programmed atrial stimulation, there were no AH jumps and no echo  beats.  At this point, the catheters were removed.  Hemostasis was  assured and the patient was returned to his room in satisfactory  condition.   COMPLICATIONS:  There were no immediate procedure complications.   RESULTS:  A.  Baseline  ECG:  Baseline ECG demonstrates atrial flutter  with a controlled ventricular response.  The atrial flutter cycle length  was 200 milliseconds.  B.  The HV interval was 50 milliseconds.  The QRS duration was 90  milliseconds.  C.  Rapid ventricular pacing:  Rapid ventricular pacing was carried out  following ablation demonstrated VA dissociation of 600 milliseconds.  D.  Programmed ventricular stimulation:  Programmed ventricular  stimulation was carried out from the RV apex demonstrating VA  dissociation at 600 milliseconds.  E.  Rapid atrial pacing:  Rapid atrial pacing was carried out from the  coronary sinus as well as the high right atrium at pacing cycle length  of 500 milliseconds and stepwise decreased down to 450  milliseconds  where AV Wenckebach was observed.  During rapid atrial pacing, the PR  interval was less than the RR interval and there was no inducible SVT.  F.  Programmed atrial stimulation:  Programmed atrial stimulation was  carried out from the coronary sinus and high right atrium at base drive  cycle length of 045 milliseconds.  The S1-S2 intervals stepwise  decreased down to 380 milliseconds where the AV node ERP was observed.  During programmed atrial stimulation, there were no AH jumps and no echo  beats.  G.  Arrhythmias observed:  1. Atrial flutter.  Initiation was present at the time of EP study.      Duration was sustained.  Cycle length was 200 milliseconds.      Termination was catheter ablation.      a.     RF energy application:  Total of 7 RF energy applications       were delivered to the usual atrial flutter isthmus resulting in       creation of atrial flutter isthmus block and with restoration of       sinus rhythm.          I. Mapping:  Mapping of the atrial flutter isthmus              demonstrated a fairly small atrial flutter isthmus with              usual orientation.   CONCLUSION:  The study demonstrates successful electrophysiologic study  and RF catheter ablation of typical atrial flutter with a total of 7 RF  energy applications delivered with usual atrial flutter isthmus  resulting in termination of atrial flutter, restoration of sinus rhythm,  and creation of bidirectional block in the atrial flutter isthmus.      Doylene Canning. Ladona Ridgel, MD  Electronically Signed     GWT/MEDQ  D:  12/23/2008  T:  12/23/2008  Job:  [id number]   cc:   Francisca December, M.D.

## 2010-10-04 NOTE — H&P (Signed)
Erik [name], Erik [name]              ACCOUNT NO.:  [id number]   MEDICAL RECORD NO.:  [id number]          PATIENT TYPE:  EMS   LOCATION:  MAJO                         FACILITY:  [facility]   PHYSICIAN:  Armanda Magic, M.D.     DATE OF BIRTH:  [birth year]   DATE OF ADMISSION:  12/19/2008  DATE OF DISCHARGE:                              HISTORY & PHYSICAL   CHIEF COMPLAINTS:  Palpitations atrial flutter with rapid ventricular  response.   HISTORY OF PRESENT ILLNESS:  This is a 70 year old white male with a  history of hypertension, paroxysmal atrial fibrillation, dyslipidemia,  who has refused to be on Coumadin in the past, who presented to the  emergency room with complaints of palpitations.  Earlier in the day, he  had talked to the cardiologist on-call because he noted his heart rate  was in the 112-120 range and was instructed to Urgent Care where he was  found to be in atrial flutter with rapid ventricular response and was  subsequently sent to the emergency room.  He is asymptomatic except for  feeling a sensation of palpitations.  He is not on Coumadin and has  refused to take in the past.  He is on propafenone.   PAST MEDICAL HISTORY:  1. Paroxysmal atrial fibrillation.  2. Hypertension.  3. Dyslipidemia.  4. The patient states he had a heart cath at some point in the past,      which showed normal coronary arteries, although there is no      documentation of this.   ALLERGIES:  None.   PAST SURGICAL HISTORY:  1. Status post bilateral knee arthroscopies.  2. Laser eye surgery.   SOCIAL HISTORY:  He is married.  He denies tobacco use.  He does drink  alcohol at least 2 drinks per night or more.  He denies any IV drug use.   FAMILY HISTORY:  His mother had a history of AFib with CVA.   MEDICATIONS:  1. Amlodipine 5 mg daily.  2. Aspirin 81 mg a day.  3. Lisinopril HCT 20/12.5 mg daily.  4. Propafenone 225 mg b.i.d.   PHYSICAL EXAMINATION:  VITAL SIGNS:  Blood pressure  116/80, heart rate  112-120, respirations 16.  He is afebrile.  GENERAL:  He is well-developed, well-nourished white male in no acute  distress.  HEENT:  Benign.  NECK:  Supple without lymphadenopathy.  No bruits.  Carotid upstrokes +2  bilaterally.  LUNGS:  Clear to auscultation throughout.  HEART:  Irregularly irregular.  No murmurs, rubs, or gallops.  Normal S1  and S2.  ABDOMEN:  Soft, nontender, nondistended with active bowel sounds.  No  hepatosplenomegaly.  EXTREMITIES:  No cyanosis, erythema, or edema.   LABORATORY DATA:  BMET is pending.  White cell count 7.3, hemoglobin  16.5, hematocrit 47.4, platelet count 256.   Chest x-ray shows no acute disease.  EKG shows atrial flutter with RVR,  heart rate 112-130 beats per minute, cannot rule out old inferior wall  MI.   ASSESSMENT:  1. New-onset atrial flutter of questionable duration, on propafenone.      The  patient is adamant about not being on Coumadin, long term.  At      this point, we are unsure of the duration of his atrial flutter.  2. History of paroxysmal atrial fibrillation.  3. Hypertension.  4. Dyslipidemia.   PLAN:  We will admit to telemetry bed, check cardiac enzymes q.8 h. x3,  start IV Cardizem drip after a bolus of 10 mg and start drip at 5 mg per  hour and titrate to keep heart rate less than 100.  Since the patient  does not want to be on Coumadin and he has a [place] score between 1-2, we  would recommend EP consult to consider ablation of atrial flutter.  In  the meantime, we will start subcu Lovenox per Pharmacy for AFib.  Hold  amlodipine and continue propafenone.      Armanda Magic, M.D.  Electronically Signed     TT/MEDQ  D:  12/19/2008  T:  12/20/2008  Job:  [id number]   cc:   Francisca December, M.D.

## 2010-10-04 NOTE — Consult Note (Signed)
[name], [name]              ACCOUNT NO.:  [id number]   MEDICAL RECORD NO.:  [id number]           PATIENT TYPE:   LOCATION:                                 FACILITY:   PHYSICIAN:  Doylene Canning. Ladona Ridgel, MD    DATE OF BIRTH:  March 12, 1941   DATE OF CONSULTATION:  12/21/2008  DATE OF DISCHARGE:                                 CONSULTATION   REQUESTING PHYSICIAN:  Francisca December, MD   INDICATION FOR CONSULTATION:  Evaluation of atrial flutter in a patient  with history of atrial fibrillation, who has been maintained on  propafenone therapy.   HISTORY OF PRESENT ILLNESS:  The patient is a 70 year old male with a  history of hypertension, paroxysmal AFib, and dyslipidemia.  The patient  has refused Coumadin in the past (his [place] score is really 1).  The  patient presented to the emergency room after having palpitations which  had begun several hours earlier.  Unlike his symptoms when he has atrial  fibrillation, these were fairly regular in his pulse and when he got to  the emergency room, he was found to be in atrial flutter with a rapid  ventricular response.  He was hospitalized and was begun initially on  rate control therapy.  He was continued on his propafenone for AFib.  His atrial flutter persisted and was quite difficult to rate control.  He is now referred for additional evaluation.  He has had no syncope.  The patient does admit to drinking alcohol in excess.  He denies active  chest pain.   His past medical history is also notable for hypertension, dyslipidemia,  and apparently he has normal coronary arteries.  He is status post  bilateral knee arthroscopy in the past.  He also has a history of eye  surgery.   SOCIAL HISTORY:  The patient is married.  He denies tobacco use.  He  drinks several alcoholic beverages per day.  He denies any recreational  drug use.   His family history is notable for mother with a stroke.   REVIEW OF SYSTEMS:  All systems were reviewed and  negative except as  noted in the HPI.   PHYSICAL EXAMINATION:  GENERAL:  He is a pleasant, well-appearing 40-  year-old man.  He looks somewhat younger than his stated age.  VITAL SIGNS:  Blood pressure was 116/82, the pulse was 100 and  irregular, the respirations were 18, temperature was 98.  HEENT:  Normocephalic and atraumatic.  Pupils are equal and round.  Oropharynx moist.  Sclerae anicteric.  NECK:  No jugular distention and no thyromegaly.  Trachea is midline.  Carotid is 2+ and symmetric.  LUNGS:  Clear bilaterally to auscultation.  No wheezes, rales, or  rhonchi are present.  There is no increased work of breathing.  CARDIOVASCULAR:  An irregular tachycardia with normal S1 and S2.  I did  not appreciate any murmurs.  ABDOMEN:  Soft, nontender.  There is no organomegaly.  Bowel sounds are  present.  No rebound or guarding.  EXTREMITIES:  No cyanosis, clubbing, or edema.  The pulses were  2+ and  symmetric.  NEUROLOGIC:  Alert and oriented x3.  Cranial nerves intact.  Strength  5/5 and symmetric.   EKG demonstrates typical atrial flutter with a variable ventricular  response at a rate of 100-110 beats per minute.   IMPRESSION:  1. Atrial flutter occurring, on propafenone.  2. Atrial fibrillation which has been fairly well controlled on      propafenone therapy.  3. Hypertension.   DISCUSSION:  I have discussed treatment options with Erik [name].  His  atrial flutter is sustained.  One option would be to proceed with  discontinue cardioversion.  Second option would be to proceed with  flutter ablation.  We will plan on obtaining a transesophageal echo to  rule out left atrial appendage thrombus and if there is none, it would  be safe to proceed with flutter ablation.  The risks, benefits, goals,  and expectation of the procedure have been discussed with the patient  and he would like to proceed in this fashion.  This will be scheduled at  the earliest possible convenient  time.      Doylene Canning. Ladona Ridgel, MD  Electronically Signed     GWT/MEDQ  D:  12/23/2008  T:  12/23/2008  Job:  [id number]   cc:   Francisca December, M.D.

## 2010-10-05 ENCOUNTER — Ambulatory Visit (INDEPENDENT_AMBULATORY_CARE_PROVIDER_SITE_OTHER): Payer: Medicare Other | Admitting: Physician Assistant

## 2010-10-05 ENCOUNTER — Encounter: Payer: Self-pay | Admitting: Physician Assistant

## 2010-10-05 DIAGNOSIS — R9389 Abnormal findings on diagnostic imaging of other specified body structures: Secondary | ICD-10-CM | POA: Insufficient documentation

## 2010-10-05 DIAGNOSIS — E785 Hyperlipidemia, unspecified: Secondary | ICD-10-CM

## 2010-10-05 DIAGNOSIS — R0602 Shortness of breath: Secondary | ICD-10-CM

## 2010-10-05 DIAGNOSIS — I4892 Unspecified atrial flutter: Secondary | ICD-10-CM

## 2010-10-05 DIAGNOSIS — I4891 Unspecified atrial fibrillation: Secondary | ICD-10-CM

## 2010-10-05 DIAGNOSIS — I2510 Atherosclerotic heart disease of native coronary artery without angina pectoris: Secondary | ICD-10-CM | POA: Insufficient documentation

## 2010-10-05 NOTE — Assessment & Plan Note (Signed)
He is in atrial fibrillation with rapid ventricular rate.  He has symptoms like this every one to 3 months.  It generally last less than 24 hours.  He usually abates with taking diltiazem.  He does have a CHADS2-VASc score of 2 with hypertension and age 70.  I discussed with him the importance of anticoagulation for stroke prevention.  I will place him on Pradaxa 150 mg twice a day with his history of normal renal function.  He will take his diltiazem today.  He will present back in follow up to see the nurse in 2 days to have an EKG.  If he feels worse over the next 48 hours he should go to the emergency room.  I will bring him back in follow up with Dr. Ladona Ridgel in the next couple of weeks.

## 2010-10-05 NOTE — Assessment & Plan Note (Signed)
He has minimal coronary plaque and normal LV function.  His chest symptoms and shortness of breath did not seem to be related to cardiac disease.  He does have a significant smoking history.  His symptoms are quite frustrating for him.  I have recommended that we go ahead and refer him to pulmonology for further evaluation.

## 2010-10-05 NOTE — Patient Instructions (Signed)
Your physician recommends that you schedule a follow-up appointment in: 2-3 WEEKS WITH DR. Ladona Ridgel AS PER SCOTT [name], PA-C  Your physician recommends that you return for lab work in: 2 WEEKS CBC W/DIFF 786.05  Your physician recommends that you return for lab work in: 8 WEEKS FASTING LIVER AND LIPID PANEL 272.4  A chest x-ray takes a picture of the organs and structures inside the chest, including the heart, lungs, and blood vessels 786.05 THE CXR ALSO NEEDS TO HAVE NIPPLE MARKERS AS PER SCOTT [name], PA-C. This test can show several things, including, whether the heart is enlarges; whether fluid is building up in the lungs; and whether pacemaker / defibrillator leads are still in place.   You have been referred to PULMONOLOGY FOR DYSPNEA 786.05  Your physician has recommended you make the following change in your medication: STOP TAKING ASPIRIN; TODAY TAKE A DILTIAZEM 60 MG WHEN YOU GET HOME. START PRAVASTATIN 10 MG 1 TAB @ NIGHT AS PER SCOTT [name], PA-C  CALL us BY Friday TO LET us KNOW IF YOU FEEL LIKE YOU HAVE GONE IN RHYTHM AND IF NOT WE WOULD LIKE FOR YOU TO COME IN Friday 10/07/10 FOR ANOTHER EKG WITH THE NURSE.

## 2010-10-05 NOTE — Assessment & Plan Note (Signed)
He had bilateral shadows and lower lung fields on his chest x-ray.  These looked like nipple shadows.  He needs a repeat chest x-ray with nipple markers to further assess.  I will arrange this.

## 2010-10-05 NOTE — Progress Notes (Signed)
History of Present Illness: Primary Electrophysiologist:  Dr. Lewayne Bunting  Erik [name] is a 70 y.o. male with a h/o AFlutter, s/p RFCA and HTN who recently saw Dr. Ladona Ridgel for follow up and noted exertional chest pain and dyspnea.  He was set up for a stress myoview.  He had poor exercise tolerance and had to be switched to a Lexiscan due to chest pain and dyspnea.  His images revealed infero-apical ischemia with EF 72%.  We admitted him to the hospital for ongoing chest pain.  His cath demonstrated normal LVF with EF 65% and minimal coronary plaque with pLAD 25% and D1 25%.  He presents for follow up.  He continues to have chest discomfort and shortness of breath.  This symptom has gradually gotten worse over the last several years.  He did smoke for 20 years at 1-2 packs per day.  He denies any sudden changes in his symptoms.  He denies any wheezing or cough.  He denies any relation to meals.  He denies dysphagia or odynophagia.  He denies vomiting.  He states that he went out of rhythm around 6 AM this morning.  He did miss all of his medications yesterday which includes Rythmol.  He is supposed to take diltiazem 3 times a day as needed for palpitations.  He has not taken that yet today.  He denies any change in his shortness of breath or chest symptoms with feelings of being out of rhythm.  He denies syncope.  Past Medical History  Diagnosis Date  . Atrial flutter     s/p RFCA  . Dyslipidemia   . Hypertension   . Atrial fibrillation     Rythmol Rx  . Basal cell carcinoma of ear   . GERD (gastroesophageal reflux disease)   . CAD (coronary artery disease)     a. cath 5/12: pLAD 25%, D1 25%, EF 65 % (done 2/2 abnormal myoview with inferapical  ischemia)    Past Surgical History  Procedure Date  . Knee arthroscopy     bilateral  . Atrial flutter ablation   . Basal cell cancer resection   . Hemorrhoid surgery     Current Outpatient Prescriptions  Medication Sig Dispense Refill  .  amLODipine (NORVASC) 5 MG tablet Take 5 mg by mouth daily.        Marland Kitchen diltiazem (CARDIZEM) 60 MG tablet Take 60 mg by mouth 3 (three) times daily as needed.        Marland Kitchen lisinopril-hydrochlorothiazide (PRINZIDE,ZESTORETIC) 20-12.5 MG per tablet Take 1 tablet by mouth daily.        Marland Kitchen omeprazole (PRILOSEC) 20 MG capsule Take 20 mg by mouth daily.        . propafenone (RYTHMOL) 225 MG tablet Take 225 mg by mouth every 8 (eight) hours.        Marland Kitchen DISCONTD: aspirin 81 MG chewable tablet Chew 81 mg by mouth daily.          No Known Allergies  History  Substance Use Topics  . Smoking status: Former Games developer  . Smokeless tobacco: Never Used  . Alcohol Use: 7.0 oz/week    14 drink(s) per week     2 drinks per night    Vital Signs: BP 116/62  Pulse 124  Ht 5\' 11"  (1.803 m)  Wt 203 lb 6.4 oz (92.262 kg)  BMI 28.37 kg/m2  PHYSICAL EXAM: Well nourished, well developed, in no acute distress HEENT: normal Neck: no JVD Cardiac:  normal S1,  S2; Irregularly irregular rhythm Lungs:  clear to auscultation bilaterally, no wheezing, rhonchi or rales Abd: soft, nontender, no hepatomegaly, no bruits Ext: no clubbing, cyanosis or edema Skin: warm and dry Neuro:  CNs 2-12 intact, no focal abnormalities noted  EKG:  Atrial fibrillation, heart rate 105, left axis deviation, no ischemic changes  ASSESSMENT AND PLAN:

## 2010-10-05 NOTE — Assessment & Plan Note (Signed)
With minimal coronary plaque, he really should be on a statin.  I will place him on low-dose pravastatin 10 mg q.h.s. And followup of lipids and LFTs in 6-8 weeks.  Of note his cholesterol was 181, triglycerides 252, HDL 44 and LDL 87 with normal LFTs during his recent hospitalization.

## 2010-10-05 NOTE — Assessment & Plan Note (Signed)
As noted, he has minimal coronary plaque.  I will stop his aspirin given that he is starting on Pradaxa now.

## 2010-10-06 ENCOUNTER — Telehealth: Payer: Self-pay | Admitting: Internal Medicine

## 2010-10-06 NOTE — Telephone Encounter (Signed)
Pt back in rhythm .

## 2010-10-06 NOTE — Telephone Encounter (Signed)
Per pt calling.  Pt went back into rhythm.

## 2010-10-07 ENCOUNTER — Emergency Department (HOSPITAL_COMMUNITY): Payer: Medicare Other

## 2010-10-07 ENCOUNTER — Inpatient Hospital Stay (HOSPITAL_COMMUNITY)
Admission: EM | Admit: 2010-10-07 | Discharge: 2010-10-08 | DRG: 310 | Disposition: A | Discharge status: Home / Self Care | Payer: Medicare Other | Attending: Cardiology | Admitting: Cardiology

## 2010-10-07 DIAGNOSIS — R002 Palpitations: Secondary | ICD-10-CM

## 2010-10-07 DIAGNOSIS — I4891 Unspecified atrial fibrillation: Secondary | ICD-10-CM

## 2010-10-07 DIAGNOSIS — R0789 Other chest pain: Secondary | ICD-10-CM | POA: Diagnosis present

## 2010-10-07 DIAGNOSIS — K219 Gastro-esophageal reflux disease without esophagitis: Secondary | ICD-10-CM | POA: Diagnosis present

## 2010-10-07 DIAGNOSIS — E785 Hyperlipidemia, unspecified: Secondary | ICD-10-CM | POA: Diagnosis present

## 2010-10-07 DIAGNOSIS — Z7982 Long term (current) use of aspirin: Secondary | ICD-10-CM

## 2010-10-07 DIAGNOSIS — I10 Essential (primary) hypertension: Secondary | ICD-10-CM | POA: Diagnosis present

## 2010-10-07 LAB — CBC
HCT: 43 % (ref 39.0–52.0)
Platelets: 251 10*3/uL (ref 150–400)
RBC: 4.44 MIL/uL (ref 4.22–5.81)
RDW: 12.4 % (ref 11.5–15.5)
WBC: 7 10*3/uL (ref 4.0–10.5)

## 2010-10-07 LAB — BASIC METABOLIC PANEL
BUN: 27 mg/dL — ABNORMAL HIGH (ref 6–23)
Calcium: 9.9 mg/dL (ref 8.4–10.5)
GFR calc non Af Amer: 47 mL/min — ABNORMAL LOW (ref >60)
Glucose, Bld: 93 mg/dL (ref 70–99)
Sodium: 138 mEq/L (ref 135–145)

## 2010-10-07 LAB — DIFFERENTIAL
Basophils Absolute: 0.1 10*3/uL (ref 0.0–0.1)
Eosinophils Relative: 1 % (ref 0–5)
Lymphocytes Relative: 35 % (ref 12–46)
Neutro Abs: 3.9 10*3/uL (ref 1.7–7.7)
Neutrophils Relative %: 55 % (ref 43–77)

## 2010-10-07 LAB — CK TOTAL AND CKMB (NOT AT ARMC)
CK, MB: 4.9 ng/mL — ABNORMAL HIGH (ref 0.3–4.0)
Relative Index: 4.2 — ABNORMAL HIGH (ref 0.0–2.5)

## 2010-10-07 LAB — MRSA PCR SCREENING: MRSA by PCR: NEGATIVE

## 2010-10-07 NOTE — Telephone Encounter (Signed)
Pt in ED now with a fib.Erik [name] this am around 5:00.  Was seen here on Weds and called yesterday with problems.  When called back rate was back to normal.  Now in the ED and wife wants to know what will be done.  Had cath last week and it was normal.  A fib is happening a lot and she is very concerned and tearful.

## 2010-10-07 NOTE — Telephone Encounter (Signed)
Dr Antoine Poche saw patient and admitted him per [facility]

## 2010-10-07 NOTE — Telephone Encounter (Signed)
Please call Trish and have someone come down to ED and see him while he is there.

## 2010-10-07 NOTE — Telephone Encounter (Signed)
As per Rosann Auerbach she saw Mr.Juncaj hours ago today. Danielle Rankin

## 2010-10-08 LAB — BASIC METABOLIC PANEL
Chloride: 104 mEq/L (ref 96–112)
GFR calc Af Amer: >60 mL/min (ref >60)
Potassium: 4.1 mEq/L (ref 3.5–5.1)

## 2010-10-09 ENCOUNTER — Telehealth: Payer: Self-pay | Admitting: Cardiology

## 2010-10-09 NOTE — Telephone Encounter (Signed)
Error

## 2010-10-10 ENCOUNTER — Telehealth: Payer: Self-pay | Admitting: Internal Medicine

## 2010-10-10 ENCOUNTER — Telehealth: Payer: Self-pay | Admitting: Nurse Practitioner

## 2010-10-10 NOTE — Telephone Encounter (Signed)
Erik [name] wife called on Saturday night (5/19) stating that pt. Was just d/c'd from hospital on Pradaxa but that pharmacy is requesting prior authorization.  Pt. Has Pradaxa samples that were provided to him in the office prior to admission.  I rec. That pt. Cont. To use his pradaxa samples and to contact the office on Monday morning to obtain authorization for pradaxa.

## 2010-10-10 NOTE — Telephone Encounter (Signed)
Pt aware appointment on 10/27/10 8:45 to see Dr Johney Frame

## 2010-10-10 NOTE — Telephone Encounter (Signed)
Spoke with patient  He is having some nausea (feels like his stomach is upset) with the Pradaxa  Will cx his lab appointment on 10/19/10  And change his appointment from 6/6 with Dr Ladona Ridgel to Dr Johney Frame on 10/27/10 Patient is very confused about his medications and some form that was given to him when he saw Lorin Picket  I have asked him to fill out forms and bring with him when to his appointment

## 2010-10-10 NOTE — Telephone Encounter (Signed)
Per pt calling, does patient need to keep lab appointment since he was recently admit. Refill praxada 150 mg. [facility] [address] [phone].

## 2010-10-13 ENCOUNTER — Encounter: Payer: Self-pay | Admitting: Pulmonary Disease

## 2010-10-14 NOTE — Discharge Summary (Signed)
Erik [name], YEPIZ              ACCOUNT NO.:  [id number]  MEDICAL RECORD NO.:  [id number]           PATIENT TYPE:  I  LOCATION:  2919                         FACILITY:  [facility]  PHYSICIAN:  Hillis Range, MD       DATE OF BIRTH:  05-02-1941  DATE OF ADMISSION:  10/07/2010 DATE OF DISCHARGE:  10/08/2010                              DISCHARGE SUMMARY   ADMISSION DIAGNOSIS:  Paroxysmal atrial fibrillation.  SECONDARY DIAGNOSES: 1. Hypertension. 2. Hyperlipidemia. 3. Hyperlipidemia. 4. Gastroesophageal reflux disease. 5. Atypical chest pain.  PROCEDURES:  None.  CONSULTATIONS:  Electrophysiology.  HISTORY OF PRESENT ILLNESS:  [name] is a pleasant 70 year old gentleman with a history of paroxysmal atrial fibrillation who is admitted with symptomatic atrial fibrillation.  He was initially diagnosed with atrial fibrillation in 2006.  He has failed medical therapy with Rythmol partially due to noncompliance with t.i.d. dosing. He has previously declined Coumadin and Pradaxa therapy.  He now presents with palpitations, fatigue, and shortness of breath.  HOSPITAL COURSE:  [name] was admitted to the Cardiology Service for symptomatic atrial fibrillation.  He appeared to be well rate controlled during his hospital stay.  He was evaluated by Electrophysiology who felt that t.i.d. dosing of Rhythmol was contributing to noncompliance and he was therefore switched to flecainide 100 mg twice daily.  He was observed on telemetry to have no ventricular arrhythmias.  He had rate-controlled atrial fibrillation but did not convert to sinus rhythm.  He was placed on Pradaxa 150 mg twice daily for stroke prevention.  Plans were made that if his atrial fibrillation did not terminate then he can return as an outpatient for elective TEE-guided cardioversion.  I had a long discussion with the patient was had regarding therapeutic strategies for atrial fibrillation including both  medicine and catheter-based therapies long-term.  Risks, benefits and alternatives to atrial fibrillation ablation were also discussed at length.  The patient felt that AFib ablation was a strategy that he would like to pursue.  He will therefore be initiated on Pradaxa for 4 weeks and then return electively in the outpatient setting for afebrile ablation.  The importance of complete alcohol cessation was also discussed at length today.  DISPOSITION:  Home.  DISCHARGE CONDITION:  Good.  DISCHARGE DIET:  Cardiac prudent.  DISCHARGE INSTRUCTIONS:  Increase activities as tolerated.  The patient is instructed to contact our office if he remains in atrial fibrillation next week to have TEE-guided cardioversion pursued at that time.  DISCHARGE MEDICATIONS: 1. Pradaxa 150 mg twice daily. 2. Flecainide 100 mg twice daily. 3. Metrazol 20 mg daily. 4. Lisinopril/hydrochlorothiazide 20/12.5 mg daily. 5. Cardizem 60 mg q.8 h. P.r.n. 6. Aspirin is discontinued. 7. Propafenone is discontinued. 8. Amlodipine 5 mg daily.  FOLLOWUP:  The patient will follow up in my office in 3-4 weeks.  He will contact us if he remains in atrial fibrillation for TEE-guided cardioversion.  He will be scheduled for atrial fibrillation ablation in 4-6 weeks.     Hillis Range, MD     JA/MEDQ  D:  10/08/2010  T:  10/08/2010  Job:  [id number]  cc:  Rollene Rotunda, MD, [facility] Doylene Canning. Ladona Ridgel, MD L. Lupe Carney, M.D.  Electronically Signed by Hillis Range MD on 10/14/2010 05:54:50 PM

## 2010-10-14 NOTE — Consult Note (Signed)
NAMEGONSALO, Erik [name]              ACCOUNT NO.:  [id number]  MEDICAL RECORD NO.:  [id number]           [name] TYPE:  I  LOCATION:  2919                         FACILITY:  [facility]  PHYSICIAN:  Hillis Range, MD       DATE OF BIRTH:  Oct 17, 1940  DATE OF CONSULTATION:  10/07/2010 DATE OF DISCHARGE:                                CONSULTATION   REQUESTING PHYSICIAN:  Rollene Rotunda, MD, [facility]  REASON FOR CONSULTATION:  Refractory atrial fibrillation.  HISTORY OF PRESENT ILLNESS:  Erik [name] is a pleasant 70 year old gentleman with a history of paroxysmal atrial fibrillation who is admitted with symptomatic atrial fibrillation.  Erik [name] reports being initially diagnosed with atrial fibrillation in May 2006.  He was treated initially with metoprolol and Coumadin.  He continued to have intermittent episodes of atrial fibrillation as well as atrial flutter and was subsequently evaluated by Dr. Lewayne Bunting.  He underwent cavotricuspid isthmus ablation by Dr. Lewayne Bunting on December 27, 2008. Despite adequate treatment for atrial flutter, he has continued to have symptomatic episodes of atrial fibrillation.  He was therefore recently evaluated by Dr. Ladona Ridgel and placed on Rhythmol 225 mg t.i.d.  Erik [name] reports ongoing episodes of atrial fibrillation despite Rhythmol and reports symptoms of fatigue, decreased exercise tolerance, palpitations, and shortness of breath.  He admits that he frequently forgets to take doses of Rhythmol which is prescribed for him to take 3 times daily.  He feels that noncompliance to some degree with Rhythmol may be partially responsible for recurrence of episodes.  He has also had difficulty with Coumadin in Erik past due to labile INRs.  Today, he reports symptoms of chest discomfort and palpitations.  He therefore presents for further evaluation and is found to have recurrent atrial fibrillation.  Most recently on May 10 of this year, he underwent  left heart catheterization for atypical chest pain and was found to have no significant coronary artery disease at that time.  His ejection fraction was 65%.  Presently, he is resting comfortably and is without complaint.  PAST MEDICAL HISTORY: 1. Atrial fibrillation and atrial flutter (as above). 2. Hypertension. 3. Hyperlipidemia. 4. GERD. 5. 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  PAST SURGICAL HISTORY: 1. Basal cell carcinoma removed from Erik right ear. 2. Knee surgery. 3. Hemorrhoidal surgery. 4. Eye surgery.  MEDICATIONS:  Reviewed in [facility].  ALLERGIES:  No known drug allergies.  SOCIAL HISTORY:  Erik [name] lives in [place] and is retired from [employer].  He has a remote history of tobacco.  He drinks 2-3 drinks several times per week.  He denies drug use.  FAMILY HISTORY:  Notable for mother who had prior stroke.  REVIEW OF SYSTEMS:  All systems are reviewed and negative except as outlined in Erik HPI above.  PHYSICAL EXAMINATION:  Telemetry reveals atrial fibrillation with an average rate of 66 beats per minute. VITAL SIGNS:  Blood pressure 123/70, heart rate 67, respirations 18, and saturations 96% on room air. GENERAL:  Erik [name] is a well-appearing male in no acute distress.  He is alert and oriented  x3. HEENT:  Normocephalic and atraumatic.  Sclerae clear.  Conjunctivae pink.  Oropharynx clear. NECK:  Supple.  No JVD, lymphadenopathy, or bruits. LUNGS:  Clear to auscultation bilaterally. HEART:  Regular, tachycardic rhythm.  No murmurs, rubs, or gallops. GI:  Soft, nontender, and nondistended.  Positive bowel sounds. EXTREMITIES:  No clubbing, cyanosis, or edema. SKIN:  No ecchymoses or lacerations. MUSCULOSKELETAL:  No deformity or atrophy. PSYCHIATRIC:  Erik [name] does appear to have difficulty with short-term memory recall and has difficulty concentrating during his exam today.  LABORATORY DATA:  Hematocrit 43, platelets  251.  Potassium 4.5 and creatinine 1.49.  Chest x-ray reveals no acute airspace disease.  Echocardiogram from May 11 reveals an ejection fraction of 55-65% with mild asymmetric septal hypertrophy with no significant valvular abnormalities.  His left atrial size is 44 mm.  Left heart catheterization as described above.  IMPRESSION:  Erik [name] is a pleasant 70 year old gentleman with a history of atrial fibrillation and atrial flutter.  He has previously undergone cavotricuspid isthmus ablation by Dr. Ladona Ridgel.  He now presents with symptomatic paroxysmal atrial fibrillation despite medical therapy with Rhythmol.  Therapeutic strategies for atrial fibrillation including both medicine and catheter-based therapies were discussed at length.  I think that we should initiate anticoagulation at this time.  I would therefore start Erik [name] on Pradaxa 150 mg b.i.d.  His creatinine is slightly above baseline at this time and I think we should follow this during his hospital stay to make sure that he is on Erik appropriate dose of Pradaxa.  In addition, I have stopped Rhythmol.  I think with his t.i.d. dosing this may be difficult for Erik [name] who has some difficulties with memory recall.  I would therefore place him on twice daily flecainide 100 mg twice daily.  In addition, we discussed risks, benefits, and alternatives to EP study and radiofrequency ablation for atrial fibrillation.  He understands that Erik risk include but are not limited to stroke, bleeding, vascular damage, perforation, tamponade, damage to Erik heart, esophagus, lungs, and pulmonary vein stenosis.  He accepts these risks and wishes to proceed.  We will therefore plan to discharge Erik [name] and bring him back electively in about 4 weeks once he has been adequately anticoagulated with Pradaxa.  Our office will arrange for TEE prior to Erik ablation and I would like to see Erik [name] in 1 or 2 weeks prior to his  ablation procedure for further outpatient consultation.     Hillis Range, MD     JA/MEDQ  D:  10/07/2010  T:  10/08/2010  Job:  [id number]  cc:   Rollene Rotunda, MD, [facility]el[facility] Fear. Ladona Ridgel, MD  Electronically Signed by Hillis Range MD on 10/14/2010 05:54:45 PM

## 2010-10-17 ENCOUNTER — Other Ambulatory Visit: Payer: Self-pay | Admitting: *Deleted

## 2010-10-17 MED ORDER — DABIGATRAN ETEXILATE MESYLATE 150 MG PO CAPS: 150.0000 mg | ORAL_CAPSULE | Freq: Two times a day (BID) | ORAL | Status: DC

## 2010-10-20 ENCOUNTER — Telehealth: Payer: Self-pay | Admitting: Internal Medicine

## 2010-10-20 NOTE — Telephone Encounter (Signed)
Pt's ablation is scheduled for 11/11/10  Pt aware and will have labs drawn on 6/15  At 8:30

## 2010-10-24 ENCOUNTER — Ambulatory Visit (INDEPENDENT_AMBULATORY_CARE_PROVIDER_SITE_OTHER): Payer: Medicare Other | Admitting: Pulmonary Disease

## 2010-10-24 ENCOUNTER — Encounter: Payer: Self-pay | Admitting: Pulmonary Disease

## 2010-10-24 VITALS — BP 118/70 | HR 46 | Temp 97.7°F | Ht 70.5 in | Wt 204.8 lb

## 2010-10-24 DIAGNOSIS — R0602 Shortness of breath: Secondary | ICD-10-CM

## 2010-10-24 NOTE — Patient Instructions (Signed)
Will set up for breathing tests, and see you back to discuss results.  Will try to find prior studies for comparison.

## 2010-10-24 NOTE — Assessment & Plan Note (Addendum)
The pt has doe that has been progressive over the last one year, and is significantly impacting his QOL.  He has had a recent negative cardiac w/u, but does have atrial arrhythmia that may be impacting things?  He has a h/o tobacco use, and raises the question of possible copd.  His cxr is clear, and his lung fields are clear today on exam.  At this point, he needs to have full pfts for evaluation.  Will see him back to discuss further.

## 2010-10-24 NOTE — Progress Notes (Signed)
  Subjective:    Patient ID: Erik [name], male    DOB: [birth year], 70 y.o.   MRN: [id number]  HPI The pt is a 69y/o male who I have been asked to see for dyspnea.  He has been having breathing issues for 3-4 yrs, and feels his symptoms have been progressive.  The past one year it has been a lot worse.  He feels "dizzy" with the sob at times.  He describes a less than 2 block doe at a moderate pace on flat ground, and will get winded bringing groceries in from the car.  He has the most issues with carrying loads.  He denies any cough, congestion, or mucus.  He has not had issues with LE edema.  He describes the "constant feeling of a softball in center of chest".  His most recent cxr last month was ok, and he has had a negative cath last month as well.  His echo showed mild LA enlargement and nl LV fxn.  He has had pfts in the past, but can't remember when or where.     Review of Systems  Constitutional: Negative for fever and unexpected weight change.  HENT: Negative for ear pain, nosebleeds, congestion, sore throat, rhinorrhea, sneezing, trouble swallowing, dental problem, postnasal drip and sinus pressure.   Eyes: Negative for redness and itching.  Respiratory: Positive for shortness of breath. Negative for cough, chest tightness and wheezing.   Cardiovascular: Positive for chest pain and palpitations. Negative for leg swelling.  Gastrointestinal: Negative for nausea and vomiting.  Genitourinary: Negative for dysuria.  Musculoskeletal: Negative for joint swelling.  Skin: Negative for rash.  Neurological: Negative for headaches.  Hematological: Does not bruise/bleed easily.  Psychiatric/Behavioral: Negative for dysphoric mood. The patient is not nervous/anxious.        Objective:   Physical Exam Constitutional:  Well developed, no acute distress  HENT:  Nares patent without discharge, but narrowed bilat.  Oropharynx without exudate, palate and uvula are normal  Eyes:  Perrla, eomi,  no scleral icterus  Neck:  No JVD, no TMG  Cardiovascular:  Normal rate, regular rhythm, no rubs or gallops.  No murmurs        Intact distal pulses  Pulmonary :  Normal breath sounds, no stridor or respiratory distress   No rales, rhonchi, or wheezing  Abdominal:  Soft, nondistended, bowel sounds present.  No tenderness noted.   Musculoskeletal:  trace lower extremity edema noted.  Lymph Nodes:  No cervical lymphadenopathy noted  Skin:  No cyanosis noted  Neurologic:  Alert, appropriate, moves all 4 extremities without obvious deficit.         Assessment & Plan:

## 2010-10-26 ENCOUNTER — Encounter: Payer: Self-pay | Admitting: *Deleted

## 2010-10-26 ENCOUNTER — Ambulatory Visit (INDEPENDENT_AMBULATORY_CARE_PROVIDER_SITE_OTHER): Payer: Medicare Other | Admitting: Internal Medicine

## 2010-10-26 ENCOUNTER — Ambulatory Visit: Payer: Medicare Other | Admitting: Internal Medicine

## 2010-10-26 ENCOUNTER — Encounter: Payer: Self-pay | Admitting: Internal Medicine

## 2010-10-26 ENCOUNTER — Other Ambulatory Visit (INDEPENDENT_AMBULATORY_CARE_PROVIDER_SITE_OTHER): Payer: Medicare Other | Admitting: *Deleted

## 2010-10-26 VITALS — BP 115/70 | HR 49 | Ht 71.0 in | Wt 203.0 lb

## 2010-10-26 DIAGNOSIS — I10 Essential (primary) hypertension: Secondary | ICD-10-CM

## 2010-10-26 DIAGNOSIS — E785 Hyperlipidemia, unspecified: Secondary | ICD-10-CM

## 2010-10-26 DIAGNOSIS — I4891 Unspecified atrial fibrillation: Secondary | ICD-10-CM

## 2010-10-26 DIAGNOSIS — R0989 Other specified symptoms and signs involving the circulatory and respiratory systems: Secondary | ICD-10-CM

## 2010-10-26 NOTE — Assessment & Plan Note (Signed)
Stable No change required today  

## 2010-10-26 NOTE — Patient Instructions (Signed)

## 2010-10-26 NOTE — Assessment & Plan Note (Signed)
The patient has symptomatic paroxysmal atrial fibrillation.  He has failed medical therapy with rhythmol and flecainide.  He is appropriately anticoagulated with pradaxa.   Therapeutic strategies for afib including medicine and ablation were discussed in detail with the patient today. Risk, benefits, and alternatives to EP study and radiofrequency ablation for afib were also discussed in detail today. These risks include but are not limited to stroke, bleeding, vascular damage, tamponade, perforation, damage to the esophagus, lungs, and other structures, pulmonary vein stenosis, worsening renal function, and death. The patient understands these risk and wishes to proceed.  We will therefore proceed with catheter ablation at the next available time.

## 2010-10-26 NOTE — Progress Notes (Signed)
The patient presents today for routine electrophysiology followup.  Since last being seen by me at [facility], the patient reports doing very well.  He continues to have occasional episodes of afib despite medical therapy with flecainide.  He has previously failed medical therapy with rhythmol.  He is tolerating pradaxa.  Today, he denies symptoms of palpitations, chest pain, shortness of breath, orthopnea, PND, lower extremity edema, dizziness, presyncope, syncope, or neurologic sequela.  The patient feels that he is tolerating medications without difficulties and is otherwise without complaint today.   Past Medical History  Diagnosis Date  . Atrial flutter     s/p RFCA  by GT  . Dyslipidemia   . Hypertension   . Atrial fibrillation     Paroxysmal  . Basal cell carcinoma of ear   . GERD (gastroesophageal reflux disease)   . CAD (coronary artery disease)     a. cath 5/12: pLAD 25%, D1 25%, EF 65 % (done 2/2 abnormal myoview with inferapical  ischemia)   Past Surgical History  Procedure Date  . Knee arthroscopy     bilateral  . Atrial flutter ablation     by GT  . Basal cell cancer resection   . Hemorrhoid surgery     Current Outpatient Prescriptions  Medication Sig Dispense Refill  . dabigatran (PRADAXA) 150 MG CAPS Take 1 capsule (150 mg total) by mouth every 12 (twelve) hours.  60 capsule  3  . diltiazem (CARDIZEM) 60 MG tablet Take 60 mg by mouth 3 (three) times daily as needed.       . flecainide (TAMBOCOR) 100 MG tablet Take 100 mg by mouth 2 (two) times daily.       Marland Kitchen lisinopril-hydrochlorothiazide (PRINZIDE,ZESTORETIC) 20-12.5 MG per tablet Take 1 tablet by mouth daily.        Marland Kitchen NITROSTAT 0.4 MG SL tablet as needed.      Marland Kitchen omeprazole (PRILOSEC) 20 MG capsule Take 20 mg by mouth daily.        Marland Kitchen DISCONTD: amLODipine (NORVASC) 5 MG tablet Take 5 mg by mouth daily.          No Known Allergies  History   Social History  . Marital Status: Married    Spouse Name: N/A   Number of Children: 1  . Years of Education: N/A   Occupational History  . 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 Smoking status: Former Smoker -- 1.5 packs/day for 20 years    Types: Cigarettes    Quit date: 05/23/1975  . Smokeless tobacco: Never Used  . Alcohol Use: 7.0 oz/week    14 drink(s) per week     2 drinks per night  . Drug Use: No  . Sexually Active: Not on file   Other Topics Concern  . Not on file   Social History Narrative  . No narrative on file    Family History  Problem Relation Age of Onset  . Hypertension Mother   . Stroke Mother   . Cancer Father   . Arthritis Mother     ROS-  All systems are reviewed and are negative except as outlined in the HPI above  Physical Exam: Filed Vitals:   10/26/10 0839  BP: 115/70  Pulse: 49  Height: 5\' 11"  (1.803 m)  Weight: 203 lb (92.08 kg)    GEN- The patient is well appearing, alert and oriented x 3 today.   Head- normocephalic,  atraumatic Eyes-  Sclera clear, conjunctiva pink Ears- hearing intact Oropharynx- clear Neck- supple, no JVP Lymph- no cervical lymphadenopathy Lungs- Clear to ausculation bilaterally, normal work of breathing Heart- Regular rate and rhythm, no murmurs, rubs or gallops, PMI not laterally displaced GI- soft, NT, ND, + BS Extremities- no clubbing, cyanosis, or edema MS- no significant deformity or atrophy Skin- no rash or lesion Psych- euthymic mood, full affect Neuro- strength and sensation are intact  Assessment and Plan:

## 2010-10-27 ENCOUNTER — Encounter: Payer: Self-pay | Admitting: Internal Medicine

## 2010-10-27 ENCOUNTER — Ambulatory Visit: Payer: Medicare Other | Admitting: Internal Medicine

## 2010-10-27 ENCOUNTER — Other Ambulatory Visit: Payer: Medicare Other | Admitting: *Deleted

## 2010-10-27 NOTE — Telephone Encounter (Signed)
error 

## 2010-10-31 ENCOUNTER — Ambulatory Visit (HOSPITAL_COMMUNITY)
Admission: RE | Admit: 2010-10-31 | Discharge: 2010-10-31 | Disposition: A | Discharge status: Home / Self Care | Payer: Medicare Other | Source: Ambulatory Visit | Attending: Pulmonary Disease | Admitting: Pulmonary Disease

## 2010-10-31 DIAGNOSIS — R0602 Shortness of breath: Secondary | ICD-10-CM | POA: Insufficient documentation

## 2010-11-03 ENCOUNTER — Encounter: Payer: Self-pay | Admitting: Pulmonary Disease

## 2010-11-04 ENCOUNTER — Telehealth: Payer: Self-pay | Admitting: Internal Medicine

## 2010-11-04 ENCOUNTER — Other Ambulatory Visit (INDEPENDENT_AMBULATORY_CARE_PROVIDER_SITE_OTHER): Payer: Medicare Other | Admitting: *Deleted

## 2010-11-04 DIAGNOSIS — R0602 Shortness of breath: Secondary | ICD-10-CM

## 2010-11-04 DIAGNOSIS — I4891 Unspecified atrial fibrillation: Secondary | ICD-10-CM

## 2010-11-04 LAB — CBC WITH DIFFERENTIAL/PLATELET
Basophils Relative: 0.9 % (ref 0.0–3.0)
Eosinophils Relative: 1.4 % (ref 0.0–5.0)
HCT: 41.5 % (ref 39.0–52.0)
Lymphs Abs: 1.9 10*3/uL (ref 0.7–4.0)
MCV: 98 fl (ref 78.0–100.0)
Monocytes Absolute: 0.5 10*3/uL (ref 0.1–1.0)
Platelets: 237 10*3/uL (ref 150.0–400.0)
WBC: 5.7 10*3/uL (ref 4.5–10.5)

## 2010-11-04 LAB — BASIC METABOLIC PANEL
BUN: 17 mg/dL (ref 6–23)
Creatinine, Ser: 1.2 mg/dL (ref 0.4–1.5)
GFR: 65.51 mL/min (ref >60.00)
Potassium: 5 mEq/L (ref 3.5–5.1)

## 2010-11-04 LAB — PROTIME-INR: Prothrombin Time: 11.3 s (ref 10.2–12.4)

## 2010-11-04 NOTE — Telephone Encounter (Signed)
Walk In pt Form " Pt Dropped off Pt Assistance Forms" sent to Message Nurse  11/04/10/km

## 2010-11-07 MED ORDER — FLECAINIDE ACETATE 100 MG PO TABS: 100.0000 mg | ORAL_TABLET | Freq: Two times a day (BID) | ORAL | Status: DC

## 2010-11-07 NOTE — Telephone Encounter (Signed)
Flecainide 100 mg. [facility] [address]

## 2010-11-08 ENCOUNTER — Encounter: Payer: Self-pay | Admitting: Pulmonary Disease

## 2010-11-08 ENCOUNTER — Ambulatory Visit (INDEPENDENT_AMBULATORY_CARE_PROVIDER_SITE_OTHER): Payer: Medicare Other | Admitting: Pulmonary Disease

## 2010-11-08 ENCOUNTER — Telehealth: Payer: Self-pay | Admitting: Internal Medicine

## 2010-11-08 VITALS — BP 94/54 | HR 53 | Temp 98.1°F | Ht 71.0 in | Wt 206.0 lb

## 2010-11-08 DIAGNOSIS — R0602 Shortness of breath: Secondary | ICD-10-CM

## 2010-11-08 NOTE — Telephone Encounter (Signed)
Left message with the street address of the pharmacy we sent prescription to

## 2010-11-08 NOTE — Assessment & Plan Note (Signed)
The pt has normal lung exam, clear cxr, and now normal pfts.  I do not see anything obvious from a pulmonary standpoint to cause his significant doe.  He is scheduled for an ablation procedure this week, and I think we should see how he feels after that.  If he continues to have doe that is greatly interfering with his QOL, would proceed with CPST.  The pt is agreeable to this approach.

## 2010-11-08 NOTE — Progress Notes (Signed)
  Subjective:    Patient ID: Erik [name], male    DOB: 06-May-1941, 70 y.o.   MRN: [id number]  HPI The pt comes in today for f/u of his recent pfts, for evaluation of doe.  He was found to have no obstruction, no restriction, and a normal DLCO.  I have reviewed the study at length with him, and answered all of his questions.    Review of Systems  Constitutional: Negative for fever and unexpected weight change.  HENT: Negative for ear pain, nosebleeds, congestion, sore throat, rhinorrhea, sneezing, trouble swallowing, dental problem, postnasal drip and sinus pressure.   Eyes: Negative for redness and itching.  Respiratory: Positive for chest tightness and shortness of breath. Negative for cough and wheezing.   Cardiovascular: Negative for palpitations and leg swelling.  Gastrointestinal: Negative for nausea and vomiting.  Genitourinary: Negative for dysuria.  Musculoskeletal: Negative for joint swelling.  Skin: Negative for rash.  Neurological: Positive for headaches.  Hematological: Bruises/bleeds easily.  Psychiatric/Behavioral: Negative for dysphoric mood. The patient is not nervous/anxious.        Objective:   Physical Exam Wd male in nad Chest clear LE without edema, no cyanosis  Alert, oriented, moves all 4        Assessment & Plan:

## 2010-11-08 NOTE — Telephone Encounter (Signed)
Pt was on cardizem dr allred changed it to flecinide  to [facility] [facility] there needs today prior to his surgery pt told wife he requested this again yesterday, i don't see that call

## 2010-11-08 NOTE — Patient Instructions (Signed)
Let's see how your breathing is doing after your ablation procedure this week.  If you are not satisfied with how things are going, please call so we can set up for a cardiopulmonary exercise test. Work on weight loss and conditioning.

## 2010-11-09 NOTE — Cardiovascular Report (Signed)
  NAMEWARD, BOISSONNEAULT NO.:  [id number]  MEDICAL RECORD NO.:  [id number]           PATIENT TYPE:  LOCATION:                                 FACILITY:  PHYSICIAN:  Rollene Rotunda, MD, FACCDATE OF BIRTH:  January 19, 1941  DATE OF PROCEDURE:  09/29/2010 DATE OF DISCHARGE:                           CARDIAC CATHETERIZATION   PRIMARY CARE PROVIDER:  Elsworth Soho, MD  CARDIOLOGIST:  Doylene Canning. Ladona Ridgel, MD  PROCEDURE:  Left heart catheterization/coronary arteriography.  INDICATIONS:  Evaluate the patient with chest pain and apical ischemia on Cardiolite.  PROCEDURE NOTE:  Left heart catheterization was performed via the right femoral artery.  The vessel was cannulated using anterior wall puncture. A #5-French arterial sheath was inserted via the modified Seldinger technique.  Preformed Judkins and pigtail catheter were placed.  The patient tolerated the procedure well and left the lab in stable condition.  RESULTS:  Hemodynamics:  LV 123/12, AO 120/66. Coronaries:  Left main was normal.  The LAD had a long proximal 25% stenosis.  First diagonal had proximal long 25% stenosis.  The circumflex was normal.  First obtuse marginal was small and normal.  The second obtuse marginal was moderate sized and normal.  The right coronary artery was dominant and normal.  There was a PDA which was small and normal.  The posterolateral was small and normal.  Left ventriculogram was obtained in the RAO projection.  The EF was 65% with normal wall motion.  CONCLUSION:  Minimal coronary plaque.  PLAN:  No further cardiovascular testing is suggested.  The patient will follow up with his primary care physician for evaluation of nonanginal chest discomfort.     Rollene Rotunda, MD, [facility]     JH/MEDQ  D:  09/29/2010  T:  09/30/2010  Job:  [id number]  cc:   L. Lupe Carney, M.D. Doylene Canning. Ladona Ridgel, MD  Electronically Signed by Rollene Rotunda MD [facility] on 11/09/2010 11:39:28 AM

## 2010-11-09 NOTE — Discharge Summary (Signed)
  [name], [name]              ACCOUNT NO.:  [id number]  MEDICAL RECORD NO.:  [id number]           PATIENT TYPE:  O  LOCATION:  [id number]                     FACILITY:  [facility]  PHYSICIAN:  Rollene Rotunda, MD, FACCDATE OF BIRTH:  [birth year]  DATE OF ADMISSION:  09/28/2010 DATE OF DISCHARGE:                              DISCHARGE SUMMARY   PROCEDURES: 1. Cardiac catheterization. 2. Coronary arteriogram. 3. Left ventriculogram.  PRIMARY FINAL DISCHARGE DIAGNOSIS:  Chest pain and shortness of breath, no significant coronary artery disease at catheterization.  SECONDARY DIAGNOSES: 1. History of atrial flutter, status post radiofrequency catheter     ablation. 2. 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 3. History of atrial fibrillation, on Rythmol. 4. Hypertension. 5. Dyslipidemia. 6. Gastroesophageal reflux disease. 7. Status post eye surgery. 8. Family history of cerebrovascular accident in his mother.  TIME AT DISCHARGE:  33 minutes.  HOSPITAL COURSE:  [name] is a 70 year old male with no previous history of critical coronary artery disease.  He had chest pain and was seen in the office.  A stress test showed inferoapical ischemia and he was having recurrent chest pain, so he was admitted for further evaluation.  Cardiac enzymes were negative for MI except for a minimally elevated initial CK-MB of 169/5.1 with an index of 3.0.  A lipid profile showed triglycerides 252, HDL 44, LDL 87.  TSH was within normal limits.  A chest x-ray was completed, but results are not available at the time of dictation.  On Sep 29, 2010, he was taken to the Cath Lab.  He had 25% lesions in the LAD and diagonal.  He had no significant disease in any other arteries and his EF was 65%.  Dr. Antoine Poche felt that he had minimal coronary plaque and no further cardiac workup was indicated for chest pain at this time.  Postprocedure, Mr.  [name] is ambulating without chest pain or shortness of breath.  He is considered stable for discharge, to follow up as an outpatient.  DISCHARGE INSTRUCTIONS: 1. His activity level to be increased gradually. 2. He is to call our office for problems with the cath site. 3. He is encouraged to stick to a low-sodium heart-healthy diet. 4. He is to follow up with Tereso Newcomer for Dr. Ladona Ridgel on Oct 05, 2010     at 2:30 and with Dr. Clovis Riley as needed.  DISCHARGE MEDICATIONS: 1. Lisinopril HCT 20/12.5 daily. 2. Propafenone 225 mg t.i.d. 3. Amlodipine 5 mg a day. 4. Aspirin 81 mg a day. 5. Omeprazole 20 mg a day. 6. Cardizem 60 mg q.8 h. p.r.n. as prior to admission.     Theodore Demark, PA-C   ______________________________ Rollene Rotunda, MD, [facility]    RB/MEDQ  D:  09/29/2010  T:  09/30/2010  Job:  [id number]  cc:   L. Lupe Carney, M.D.  Electronically Signed by Theodore Demark PA-C on 10/19/2010 11:21:39 AM Electronically Signed by Rollene Rotunda MD [facility] on 11/09/2010 11:39:34 AM

## 2010-11-09 NOTE — H&P (Signed)
Erik [name], Erik [name]              ACCOUNT NO.:  [id number]  MEDICAL RECORD NO.:  [id number]           PATIENT TYPE:  I  LOCATION:  2919                         FACILITY:  [facility]  PHYSICIAN:  Rollene Rotunda, MD, FACCDATE OF BIRTH:  16-Mar-1941  DATE OF ADMISSION:  10/07/2010 DATE OF DISCHARGE:                             HISTORY & PHYSICAL   PRIMARY CARE PHYSICIAN:  L. Lupe Carney, MD  PRIMARY CARDIOLOGIST:  Doylene Canning. Ladona Ridgel, MD  CHIEF COMPLAINT:  Palpitations/atrial fibrillation.  HISTORY OF PRESENT ILLNESS:  Erik [name] is a 70 year old male with a history of PAF.  He was diagnosed also with atrial flutter in 2010 and had an ablation for this.  Since then, he has been maintained on the Rythmol, but notes frequent brief tachy palpitations.  The episodes last from seconds to hours, but always resolve.  He has a p.r.n. prescription for diltiazem 60 mg which he takes occasionally.  On Oct 05, 2010, he felt his heart out of rhythm.  He was seen in the office then and was noted to be in AFib with a rate of 105.  He was encouraged to take his diltiazem as prescribed and follow up in the office today with an EKG to be sure he was back in rhythm.  He took his extra diltiazem several times yesterday, and at one point he documented his heart rate to be in the 50s but knew it was still irregular.  He has had some dizziness as well as a jittery feeling.  He has been a little weak with exertion.  He has had a headache.  He has had some additional dyspnea on exertion over his baseline, and when he is very short of breath and breathing very fast, he will feel some numbness in his thighs.  He has chest pain for which he had to a cardiac catheterization on Sep 29, 2010, that showed nonobstructive disease and that chest pain has never resolved.  He feels his shortness of breath and chest pain are at their previous baseline now.  PAST MEDICAL HISTORY: 1. History of PAF. 2. History of chest  pain with cardiac catheterization on Sep 29, 2010,     showing LAD and first diagonal 25%, no other significant disease     and EF 65% with normal wall motion. 3. Anticoagulation with aspirin only secondary to the patient refusing     Coumadin in the past secondary to increased bleeding risk and     problems with managing it when he was on it before. 4. Atrial flutter status post radiofrequency catheter ablation. 5. Hypertension. 6. Dyslipidemia. 7. Gastroesophageal reflux disease. 8. Family history of atrial fibrillation and CVA in his mother.  SURGICAL HISTORY:  He is status post removal of a basal cell carcinoma from his right ear as well as knee surgery, hemorrhoid surgery, eye surgery, and cardiac cath.  ALLERGIES:  No known drug allergies.  MEDICATIONS: 1. Lisinopril HCT 20/12.5 daily. 2. Propafenone 225 mg t.i.d. 3. Amlodipine 5 mg a day. 4. Aspirin 81 mg a day. 5. Omeprazole 20 mg a day. 6. Cardizem 60 mg q.8 h. p.r.n. 7. Pravachol  10 mg daily. 8. Pradaxa 150 mg b.i.d. (not filled yet), prescriptions given to him     on Oct 05, 2010, by [facility].  SOCIAL HISTORY:  He lives in [place] with his significant other.  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  He has a greater than 30 pack-year history of tobacco use but quit more than 30 years ago.  He drinks one or two drinks a day.  He denies drug use.  FAMILY HISTORY:  His mother died at 12 with a history of AFib and a CVA and his father died at 35 with cancer but no heart disease.  He has one sister and she has no heart disease.  REVIEW OF SYSTEMS:  He has occasional arthralgias.  He has had some problems with reflux symptoms but no melena.  The chest pain is a central chest pain that is substernal and a pressure and feels like "brick" on his chest.  It is constant and never resolved.  He has chronic dyspnea on exertion and feels that he gets very short of breath with significant exertion.   Full 14-point review of systems is otherwise negative except as stated in the HPI.  PHYSICAL EXAMINATION:  VITAL SIGNS:  Temperature is 97.8, blood pressure 108/56, heart rate 111, respiratory rate 20, O2 saturation 94% on room air. GENERAL:  He is a well-developed, well-nourished white male in no acute distress at rest. HEENT:  Normal. NECK:  There is no lymphadenopathy, thyromegaly, bruit, or JVD noted. CVA:  Heart is irregular in rate and rhythm with an S1 and S2 and no significant murmur, rub, or gallop is noted.  Distal pulses are intact in all four extremities. LUNGS:  Clear to auscultation bilaterally. SKIN:  No rashes or lesions are noted. ABDOMEN:  Soft and nontender with active bowel sounds. EXTREMITIES:  There is no cyanosis, clubbing, or edema noted. MUSCULOSKELETAL:  There is no joint deformity or effusions and no spine or CVA tenderness. NEUROLOGIC:  He is alert and oriented.  Cranial nerves II through XII grossly intact.  Chest x-ray two-view repeated because of previous nipple shadows was without significant abnormalities.  EKG is atrial fibrillation, rate 96, with no acute ischemic changes.  LABORATORY VALUES:  Sodium 138, potassium 4.5, chloride 100, CO2 28, BUN 27, creatinine 1.49, glucose 93.  Hemoglobin 15.1, hematocrit 43, WBC 7.0, platelets 251.  CK-MB 118/4.9 with an index of 4.2 and a troponin-I of less than 0.3.  IMPRESSION:  Erik [name] was seen today by Dr. Antoine Poche, the patient evaluated and the data reviewed.  He has recurrent palpitations that are atrial fibrillation but no new chest pain.  He has no new shortness of breath.  He is failing propafenone and he has not wanted to take Coumadin in the past because when he was on it previously he had problems maintaining a therapeutic level.  He was prescribed Pradaxa but never got the prescription filled because he did not know the cost and was told it was high.    Atrial fibrillation:  Consider  Tikosyn versus atrial fibrillation ablation.  Dr. Johney Frame has been contacted and asked to see the patient for consideration of different options.  The propafenone has been discontinued.  He is being started on heparin.  He has been rate controlled with intravenous Cardizem.  His BUN and creatinine are slightly elevated today compared to precatheterization levels from Sep 28, 2010.  He is being hydrated and we will follow his renal  function closely.  His systolic blood pressure dropped briefly into the 80s after being started on the Cardizem, but he has responded well to IV fluids and his HCT as well as his lisinopril are on hold.     Theodore Demark, PA-C   ______________________________ Rollene Rotunda, MD, [facility]    RB/MEDQ  D:  10/07/2010  T:  10/07/2010  Job:  [id number]  Electronically Signed by Theodore Demark PA-C on 10/13/2010 11:27:49 AM Electronically Signed by Rollene Rotunda MD [facility] on 11/09/2010 11:39:42 AM

## 2010-11-10 ENCOUNTER — Ambulatory Visit (HOSPITAL_COMMUNITY)
Admission: RE | Admit: 2010-11-10 | Discharge: 2010-11-12 | Disposition: A | Discharge status: Home / Self Care | Payer: Medicare Other | Source: Ambulatory Visit | Attending: Cardiovascular Disease | Admitting: Cardiovascular Disease

## 2010-11-10 ENCOUNTER — Encounter: Payer: Self-pay | Admitting: Pulmonary Disease

## 2010-11-10 DIAGNOSIS — F1010 Alcohol abuse, uncomplicated: Secondary | ICD-10-CM | POA: Insufficient documentation

## 2010-11-10 DIAGNOSIS — I4891 Unspecified atrial fibrillation: Secondary | ICD-10-CM

## 2010-11-10 DIAGNOSIS — I10 Essential (primary) hypertension: Secondary | ICD-10-CM | POA: Insufficient documentation

## 2010-11-10 DIAGNOSIS — Z23 Encounter for immunization: Secondary | ICD-10-CM | POA: Insufficient documentation

## 2010-11-11 ENCOUNTER — Ambulatory Visit (HOSPITAL_COMMUNITY): Admission: RE | Admit: 2010-11-11 | Payer: Medicare Other | Source: Ambulatory Visit | Admitting: Internal Medicine

## 2010-11-11 LAB — POCT ACTIVATED CLOTTING TIME
Activated Clotting Time: 287 seconds
Activated Clotting Time: 165 seconds

## 2010-11-12 ENCOUNTER — Inpatient Hospital Stay (HOSPITAL_COMMUNITY)
Admission: EM | Admit: 2010-11-12 | Discharge: 2010-11-14 | DRG: 069 | Disposition: A | Discharge status: Home / Self Care | Payer: Medicare Other | Attending: Internal Medicine | Admitting: Internal Medicine

## 2010-11-12 ENCOUNTER — Emergency Department (HOSPITAL_COMMUNITY): Payer: Medicare Other

## 2010-11-12 DIAGNOSIS — Z85828 Personal history of other malignant neoplasm of skin: Secondary | ICD-10-CM

## 2010-11-12 DIAGNOSIS — I4891 Unspecified atrial fibrillation: Secondary | ICD-10-CM | POA: Diagnosis present

## 2010-11-12 DIAGNOSIS — I2510 Atherosclerotic heart disease of native coronary artery without angina pectoris: Secondary | ICD-10-CM | POA: Diagnosis present

## 2010-11-12 DIAGNOSIS — G459 Transient cerebral ischemic attack, unspecified: Principal | ICD-10-CM | POA: Diagnosis present

## 2010-11-12 DIAGNOSIS — Z23 Encounter for immunization: Secondary | ICD-10-CM

## 2010-11-12 DIAGNOSIS — K219 Gastro-esophageal reflux disease without esophagitis: Secondary | ICD-10-CM | POA: Diagnosis present

## 2010-11-12 DIAGNOSIS — I10 Essential (primary) hypertension: Secondary | ICD-10-CM | POA: Diagnosis present

## 2010-11-12 DIAGNOSIS — Z7901 Long term (current) use of anticoagulants: Secondary | ICD-10-CM

## 2010-11-12 DIAGNOSIS — F1010 Alcohol abuse, uncomplicated: Secondary | ICD-10-CM | POA: Diagnosis present

## 2010-11-12 DIAGNOSIS — E785 Hyperlipidemia, unspecified: Secondary | ICD-10-CM | POA: Diagnosis present

## 2010-11-12 DIAGNOSIS — Z79899 Other long term (current) drug therapy: Secondary | ICD-10-CM

## 2010-11-12 LAB — URINALYSIS, ROUTINE W REFLEX MICROSCOPIC
Ketones, ur: NEGATIVE mg/dL
Leukocytes, UA: NEGATIVE
Nitrite: NEGATIVE
pH: 5.5 (ref 5.0–8.0)

## 2010-11-12 LAB — CBC
HCT: 38.4 % — ABNORMAL LOW (ref 39.0–52.0)
Platelets: 207 10*3/uL (ref 150–400)
RDW: 11.8 % (ref 11.5–15.5)
WBC: 11.2 10*3/uL — ABNORMAL HIGH (ref 4.0–10.5)

## 2010-11-12 LAB — COMPREHENSIVE METABOLIC PANEL
Alkaline Phosphatase: 45 U/L (ref 39–117)
BUN: 14 mg/dL (ref 6–23)
CO2: 25 mEq/L (ref 19–32)
Chloride: 99 mEq/L (ref 96–112)
Creatinine, Ser: 1.13 mg/dL (ref 0.50–1.35)
GFR calc non Af Amer: >60 mL/min (ref >60)
Glucose, Bld: 108 mg/dL — ABNORMAL HIGH (ref 70–99)
Total Bilirubin: 1 mg/dL (ref 0.3–1.2)

## 2010-11-12 LAB — PROTIME-INR
INR: 1.2 (ref 0.00–1.49)
Prothrombin Time: 15.5 seconds — ABNORMAL HIGH (ref 11.6–15.2)

## 2010-11-12 LAB — DIFFERENTIAL
Basophils Absolute: 0 10*3/uL (ref 0.0–0.1)
Basophils Relative: 0 % (ref 0–1)
Eosinophils Absolute: 0 10*3/uL (ref 0.0–0.7)
Eosinophils Relative: 0 % (ref 0–5)
Lymphocytes Relative: 15 % (ref 12–46)

## 2010-11-12 LAB — APTT: aPTT: 48 seconds — ABNORMAL HIGH (ref 24–37)

## 2010-11-13 ENCOUNTER — Inpatient Hospital Stay (HOSPITAL_COMMUNITY): Payer: Medicare Other

## 2010-11-13 DIAGNOSIS — I4891 Unspecified atrial fibrillation: Secondary | ICD-10-CM

## 2010-11-13 LAB — HEMOGLOBIN A1C
Hgb A1c MFr Bld: 5.8 % — ABNORMAL HIGH (ref <5.7)
Mean Plasma Glucose: 120 mg/dL — ABNORMAL HIGH (ref <117)

## 2010-11-13 LAB — APTT: aPTT: 60 seconds — ABNORMAL HIGH (ref 24–37)

## 2010-11-13 LAB — LIPID PANEL: Cholesterol: 158 mg/dL (ref 0–200)

## 2010-11-13 MED ORDER — GADOBENATE DIMEGLUMINE 529 MG/ML IV SOLN
20.0000 mL | Freq: Once | INTRAVENOUS | Status: AC
  Administered 2010-11-13: 20 mL via INTRAVENOUS

## 2010-11-13 NOTE — Discharge Summary (Signed)
  Erik [name], COATS NO.:  [id number]  MEDICAL RECORD NO.:  [id number]  LOCATION:  2918                         FACILITY:  [facility]  PHYSICIAN:  Hillis Range, MD       DATE OF BIRTH:  July 07, 1940  DATE OF ADMISSION:  11/10/2010 DATE OF DISCHARGE:  11/12/2010                              DISCHARGE SUMMARY   ADMISSION DIAGNOSIS:  Atrial fibrillation.  SECONDARY DIAGNOSES: 1. Hypertension. 2. Gastroesophageal reflux disease. 3. Moderate alcohol consumption. 4. Hyperlipidemia.  PROCEDURES:  EP study and radiofrequency ablation for atrial fibrillation.  CONSULTATION:  None.  HISTORY OF PRESENT ILLNESS:  Erik [name] is a pleasant 70 year old gentleman with a history of paroxysmal atrial fibrillation.  He previously underwent cavotricuspid isthmus ablation by Dr. Ladona Ridgel. Unfortunately, he has developed recurrent symptomatic atrial fibrillation.  He has failed medical therapy with Rythmol and diltiazem. He now presents for EP study and radiofrequency ablation of atrial fibrillation.  HOSPITAL COURSE:  Informed written consent was obtained and the patient was brought to the electrophysiology lab in the fasting state.  He presented to the electrophysiology lab in normal sinus rhythm.  He underwent successful electrical isolation and anatomical encircling of the right superior pulmonary vein, right middle pulmonary vein, right inferior pulmonary vein, left superior pulmonary vein, and left inferior pulmonary vein using radiofrequency current.  He also underwent electrical isolation of the superior vena cava.  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  Isoproterenol was infused and no arrhythmias were observed.  The patient was therefore observed overnight without early apparent complication.  At time of discharge, the patient was alert, ambulatory and otherwise without complaint.  DISPOSITION:   Home.  DISCHARGE CONDITION:  Good.  DISCHARGE DIET:  Cardiac prudent.  DISCHARGE INSTRUCTIONS:  Increase activities as tolerated.  Complete alcohol cessation was also advised.  DISCHARGE MEDICATIONS: 1. Pradaxa 150 mg twice daily. 2. Flecainide 100 mg twice daily. 3. Omeprazole 20 mg daily. 4. Lisinopril/hydrochlorothiazide 20/12.5 mg daily. 5. Diltiazem 60 mg q.8 h p.r.n. 6. Amlodipine 5 mg daily.  FOLLOWUP:  The patient will follow up with Dr. Johney Frame in 12 weeks.     Hillis Range, MD     JA/MEDQ  D:  11/12/2010  T:  11/12/2010  Job:  [id number]  cc:   Doylene Canning. Ladona Ridgel, MD L. Lupe Carney, M.D.  Electronically Signed by Hillis Range MD on 11/13/2010 04:55:26 PM

## 2010-11-13 NOTE — Op Note (Signed)
NAMEARISTON, GRANDISON NO.:  [id number]  MEDICAL RECORD NO.:  [id number]  LOCATION:  2918                         FACILITY:  [facility]  PHYSICIAN:  Hillis Range, MD       DATE OF BIRTH:  Dec 11, 1940  DATE OF PROCEDURE: DATE OF DISCHARGE:                              OPERATIVE REPORT   SURGEON:  Hillis Range, MD  PREPROCEDURE DIAGNOSIS:  Paroxysmal atrial fibrillation.  POSTPROCEDURE DIAGNOSIS:  Paroxysmal atrial fibrillation.  PROCEDURES: 1. Comprehensive electrophysiologic study. 2. Coronary sinus pacing and recording. 3. Three-D mapping of supraventricular tachycardia. 4. Radiofrequency ablation of supraventricular tachycardia. 5. Arterial blood pressure monitoring. 6. Intracardiac echocardiography. 7. Transseptal puncture. 8. Pulmonary vein venography. 9. Transseptal puncture on intact septum. 10.Isoproterenol infusion. 11.Catheter placement within the superior vena cava.  INTRODUCTION:  Mr. Gell is a pleasant 70 year old gentleman with a history of symptomatic paroxysmal atrial fibrillation.  He previously underwent a cavotricuspid isthmus ablation by Dr. Ladona Ridgel. Unfortunately, he has developed recurrent symptomatic atrial fibrillation.  He has failed medical therapy with Rythmol and diltiazem. He therefore presents today for EP study and radiofrequency ablation.  DESCRIPTION OF PROCEDURE:  Informed written consent was obtained and the patient was brought to the electrophysiology lab in the fasting state. He was adequately sedated with intravenous medications as outlined in the anesthesia report.  The patient's right and left groins were prepped and draped in the usual sterile fashion by the EP lab staff.  Using a percutaneous Seldinger technique, two 7-French and one 8-French hemostasis sheaths were placed in the right common femoral vein.  A 4- [place] hemostasis sheath was placed in the right common femoral artery for blood pressure monitoring.   An 11-French hemostasis sheath was placed in the left common femoral vein.  A 7-French decapolar coronary sinus catheter was introduced through the right common femoral vein and advanced into the coronary sinus for recording and pacing from this location.  A 6-French quadripolar Josephson catheter was introduced through the right common femoral vein and advanced into the right ventricle for recording and pacing.  This catheter was then pulled back to the His bundle location.  The patient presented to the electrophysiology lab in normal sinus rhythm.  His PR interval measured 257 milliseconds with a QRS duration of 101 milliseconds and a QT interval of 480 milliseconds.  His AH interval measured 150 milliseconds with an HV interval of 54 milliseconds.  Ventricular pacing was performed which revealed VA dissociation when pacing at a basic cycle length of 600 milliseconds.  A 10-French Biosense Webster AcuNav intracardiac echocardiography catheter was introduced through the left common femoral vein and advanced into the right atrium.  Intracardiac echocardiography was performed which revealed a moderate-sized left atrium.  The patient was noted to have moderate-sized pulmonary veins with no evidence of pulmonary vein stenosis.  A very small right middle pulmonary vein was also observed arising off a separate trunk just below the right superior pulmonary vein.  The middle right common femoral vein sheath was exchanged for an 8.5 [place] SL2 transseptal sheath and transseptal access was achieved in a standard fashion using a Brockenbrough needle under biplane fluoroscopy with intracardiac echocardiography confirmation of the transseptal puncture.  The patient was noted to have a rather thick fossa ovalis, however, transseptal access was achieved without difficulty.  Once transseptal access was achieved, heparin was administered intravenously and intra-arterially in order to maintain an ACT of  greater than 350 seconds throughout the procedure.  A 6-French multipurpose angiographic catheter with guidewire was introduced through the transseptal sheath and positioned over the mouth of the pulmonary veins.  Pulmonary venograms were performed by hand injection of nonionic contrast and demonstrated moderate-sized pulmonary veins with no evidence of pulmonary vein stenosis.  The angiographic catheter was then removed.  The His bundle catheter was removed and in its place a 3.5 mm Biosense Webster EZ steer ThermoCool ablation catheter was advanced into the right atrium.  The transseptal sheath was pulled back into the IVC over a guidewire.  The ablation catheter was advanced across the transseptal hole using the wire as a guide.  The transseptal sheath was then re-advanced over the guidewire into the left atrium.  A duodecapolar circular mapping catheter was introduced through the transseptal sheath and positioned over the mouth of the pulmonary veins.  Three-dimensional electroanatomical mapping was performed using CARTO technology.  CARTO-Sound was used to register an intracardiac echocardiography image to the left atrium.  The patient then underwent successful sequential electrical isolation and anatomical encircling of the right superior pulmonary vein, right middle pulmonary vein, right inferior pulmonary vein, left superior pulmonary vein, and left inferior pulmonary vein using radiofrequency current with the circular mapping catheter as a guide.  Following ablation, isoproterenol was infused up to 20 mcg per minute with no inducible atrial tachycardia, atrial fibrillation, or atrial flutter observed. Isoproterenol was therefore allowed to wash out.  The circular mapping catheter and ablation catheters were both pulled back into the right atrium.  The circular mapping catheter was then positioned within the superior vena cava just at the junction of the SVC and right atrium.   A series of radiofrequency applications were delivered in a circular fashion at the junction of the superior vena cava and right atrium in order to isolate the superior vena cava.  Prior to each ablation lesion pacing was performed from the distal ablation electrode at maximum output with fluoroscopic visualization to confirm that diaphragmatic stimulation was not present.  No lesions were delivered over areas where diaphragmatic stimulation could be observed.  In addition, fluoroscopic visualization of the diaphragm was performed during ablation.  In this fashion, the superior vena cava was successfully electrically isolated. Differential atrial pacing was then performed from the low lateral right atrium which confirmed complete bidirectional cavotricuspid isthmus block from a prior ablation procedure with a stimulus to earliest atrial activation recorded bidirectional across the isthmus measuring 170 milliseconds.  Following ablation, the AH interval measured 141 milliseconds with an HV interval of 59 milliseconds.  Rapid atrial pacing was performed which revealed an AV Wenckebach cycle length of 470 milliseconds.  Atrial pacing was continued down to a cycle length of 300 milliseconds with no sustained arrhythmias observed.  The procedure was therefore considered completed.  All catheters were removed and the sheaths were aspirated and flushed.  The patient was transferred to the recovery area for sheath removal per protocol.  A limited bedside transthoracic echocardiogram revealed no pericardial effusion.  There were no early apparent complications.  CONCLUSIONS: 1. Sinus rhythm upon presentation. 2. Successful electrical isolation and anatomical encircling of the     right superior pulmonary vein, right middle pulmonary vein, right     inferior pulmonary vein, left  superior pulmonary vein, and left     inferior pulmonary veins using radiofrequency current. 3. Electrical isolation  of the superior vena cava was performed. 4. Cavotricuspid isthmus block was confirmed to be present from a     prior ablation procedure. 5. No sustained arrhythmias following ablation both on and off of     isoproterenol. 6. No early apparent complications.     Hillis Range, MD     JA/MEDQ  D:  11/11/2010  T:  11/12/2010  Job:  [id number]  cc:   Doylene Canning. Ladona Ridgel, MD L. Lupe Carney, M.D.  Electronically Signed by Hillis Range MD on 11/13/2010 04:55:23 PM

## 2010-11-14 ENCOUNTER — Other Ambulatory Visit: Payer: Medicare Other | Admitting: *Deleted

## 2010-11-14 DIAGNOSIS — G459 Transient cerebral ischemic attack, unspecified: Secondary | ICD-10-CM

## 2010-11-16 MED ORDER — GADOBENATE DIMEGLUMINE 529 MG/ML IV SOLN: 20.0000 mL | Freq: Once | INTRAVENOUS | Status: DC

## 2010-11-18 ENCOUNTER — Ambulatory Visit (INDEPENDENT_AMBULATORY_CARE_PROVIDER_SITE_OTHER): Payer: Medicare Other | Admitting: Internal Medicine

## 2010-11-18 ENCOUNTER — Encounter: Payer: Self-pay | Admitting: Internal Medicine

## 2010-11-18 ENCOUNTER — Other Ambulatory Visit: Payer: Medicare Other | Admitting: *Deleted

## 2010-11-18 VITALS — BP 114/68 | HR 101 | Resp 16 | Ht 71.0 in | Wt 202.0 lb

## 2010-11-18 DIAGNOSIS — G459 Transient cerebral ischemic attack, unspecified: Secondary | ICD-10-CM | POA: Insufficient documentation

## 2010-11-18 DIAGNOSIS — Z8673 Personal history of transient ischemic attack (TIA), and cerebral infarction without residual deficits: Secondary | ICD-10-CM | POA: Insufficient documentation

## 2010-11-18 DIAGNOSIS — I4891 Unspecified atrial fibrillation: Secondary | ICD-10-CM

## 2010-11-18 NOTE — Patient Instructions (Signed)
Your physician recommends that you schedule a follow-up appointment in: 3 weeks with Dr. Allred  

## 2010-11-18 NOTE — Assessment & Plan Note (Signed)
Doing well now s/p ablation.  He is having ERAF.   We will continue flecainide and diltiazem which he takes prn.   Continue ASA and pradaxa. The importance of compliance with medicines and ETOH avoidance were stressed today.

## 2010-11-18 NOTE — Assessment & Plan Note (Signed)
Stable No change required today  

## 2010-11-18 NOTE — Progress Notes (Signed)
The patient presents today for post op followup.  He underwent uneventful PVI 11/11/10.  Unfortunately, he had TIA with expressive aphasia 11/12/10.  CT/MRI were unrevealing and symptoms completely resolved within 45 minutes.  He was observed and did well.  He did have ERAF during his hospitalization.  ASA has been added to his pradaxa.  He reports doing well since that time.  HE reports occasional palpitations with afib. Today, he denies symptoms of chest pain, shortness of breath, orthopnea, PND, lower extremity edema, dizziness, presyncope, syncope, or neurologic sequela.  The patient feels that he is tolerating medications without difficulties and is otherwise without complaint today.   Past Medical History  Diagnosis Date  . Atrial flutter     s/p RFCA  by GT  . Dyslipidemia   . Hypertension   . Atrial fibrillation     Paroxysmal, s/p PVI 11/11/10  . Basal cell carcinoma of ear   . GERD (gastroesophageal reflux disease)   . CAD (coronary artery disease)     a. cath 5/12: pLAD 25%, D1 25%, EF 65 % (done 2/2 abnormal myoview with inferapical  ischemia)  . TIA (transient ischemic attack)     11/12/10 post afib ablation   Past Surgical History  Procedure Date  . Knee arthroscopy     bilateral  . Atrial flutter ablation     by GT  . Basal cell cancer resection   . Hemorrhoid surgery     Current Outpatient Prescriptions  Medication Sig Dispense Refill  . acetaminophen (TYLENOL) 325 MG tablet Take 650 mg by mouth every 6 (six) hours as needed.        Marland Kitchen amLODipine (NORVASC) 5 MG tablet Take 5 mg by mouth daily.        Marland Kitchen aspirin 81 MG tablet Take 81 mg by mouth daily.        . dabigatran (PRADAXA) 150 MG CAPS Take 1 capsule (150 mg total) by mouth every 12 (twelve) hours.  60 capsule  3  . diltiazem (CARDIZEM) 60 MG tablet Take 60 mg by mouth 3 (three) times daily as needed.       . flecainide (TAMBOCOR) 100 MG tablet Take 1 tablet (100 mg total) by mouth 2 (two) times daily.  60 tablet   12  . lisinopril-hydrochlorothiazide (PRINZIDE,ZESTORETIC) 20-12.5 MG per tablet Take 1 tablet by mouth daily.        Marland Kitchen NITROSTAT 0.4 MG SL tablet as needed.      Marland Kitchen omeprazole (PRILOSEC) 20 MG capsule Take 20 mg by mouth daily.        Marland Kitchen DISCONTD: amLODipine (NORVASC) 5 MG tablet Take 5 mg by mouth daily.         No current facility-administered medications for this visit.   Facility-Administered Medications Ordered in Other Visits  Medication Dose Route Frequency Provider Last Rate Last Dose  . gadobenate dimeglumine (MULTIHANCE) injection 20 mL  20 mL Intravenous Once Medication Radiologist        No Known Allergies  History   Social History  . Marital Status: Married    Spouse Name: N/A    Number of Children: 1  . Years of Education: N/A   Occupational History  . 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 Smoking status: Former Smoker -- 1.5 packs/day for 20 years    Types: Cigarettes    Quit date: 05/23/1975  . Smokeless tobacco: Never Used  . Alcohol  Use: 7.0 oz/week    14 drink(s) per week     2 drinks per night  . Drug Use: No  . Sexually Active: Not on file   Other Topics Concern  . Not on file   Social History Narrative  . No narrative on file    Family History  Problem Relation Age of Onset  . Hypertension Mother   . Stroke Mother   . Cancer Father   . Arthritis Mother    Physical Exam: Filed Vitals:   11/18/10 0840  BP: 114/68  Pulse: 101  Resp: 16  Height: 5\' 11"  (1.803 m)  Weight: 202 lb (91.627 kg)    GEN- The patient is well appearing, alert and oriented x 3 today.   Head- normocephalic, atraumatic Eyes-  Sclera clear, conjunctiva pink Ears- hearing intact Oropharynx- clear Neck- supple, no JVP Lymph- no cervical lymphadenopathy Lungs- Clear to ausculation bilaterally, normal work of breathing Heart- irregular rate and rhythm, no murmurs, rubs or gallops, PMI not laterally displaced GI- soft,  NT, ND, + BS Extremities- no clubbing, cyanosis, or edema, groins without hematoma/ bruit MS- no significant deformity or atrophy Skin- no rash or lesion Psych- euthymic mood, full affect Neuro- strength and sensation are intact  ekg today reveals afib, V rate 100 bpm, otherwise unremarkable  Assessment and Plan:

## 2010-11-18 NOTE — Assessment & Plan Note (Signed)
No residual defects Continue ASA and pradaxa.

## 2010-11-22 NOTE — Progress Notes (Signed)
Addended by: Judithe Modest D on: 11/22/2010 12:02 PM   Modules accepted: Orders

## 2010-12-01 ENCOUNTER — Encounter: Payer: Self-pay | Admitting: Internal Medicine

## 2010-12-07 ENCOUNTER — Encounter: Payer: Self-pay | Admitting: Internal Medicine

## 2010-12-07 ENCOUNTER — Ambulatory Visit (INDEPENDENT_AMBULATORY_CARE_PROVIDER_SITE_OTHER): Payer: Medicare Other | Admitting: Internal Medicine

## 2010-12-07 DIAGNOSIS — R0602 Shortness of breath: Secondary | ICD-10-CM

## 2010-12-07 DIAGNOSIS — I4891 Unspecified atrial fibrillation: Secondary | ICD-10-CM

## 2010-12-07 NOTE — Assessment & Plan Note (Signed)
Unclear etiology This preceeds his ablation Cath 5/12 reveals no obstructive CAD Consider CPX upon return.

## 2010-12-07 NOTE — Patient Instructions (Signed)
Your physician recommends that you schedule a follow-up appointment in: 2 months with Dr Allred  

## 2010-12-07 NOTE — Progress Notes (Signed)
The patient presents today for post op followup.  He has done well since his last visit to our clinic.  His afib has resolved.  He denies any further neurologic concerns.  He continues to have SOB and atypical chest pains.  He reports that this is stable and has not worsened over the past 2 years.  He underwent cath 5/12 which revealed nonobstructive disease. Today, he denies symptoms of orthopnea, PND, lower extremity edema, dizziness, presyncope, syncope, or neurologic sequela.  The patient feels that he is tolerating medications without difficulties and is otherwise without complaint today.   Past Medical History  Diagnosis Date  . Atrial flutter     s/p RFCA  by GT  . Dyslipidemia   . Hypertension   . Atrial fibrillation     Paroxysmal, s/p PVI 11/11/10  . Basal cell carcinoma of ear   . GERD (gastroesophageal reflux disease)   . CAD (coronary artery disease)     a. cath 5/12: pLAD 25%, D1 25%, EF 65 % (done 2/2 abnormal myoview with inferapical  ischemia)  . TIA (transient ischemic attack)     11/12/10 post afib ablation   Past Surgical History  Procedure Date  . Knee arthroscopy     bilateral  . Atrial flutter ablation     by GT  . Basal cell cancer resection   . Hemorrhoid surgery     Current Outpatient Prescriptions  Medication Sig Dispense Refill  . acetaminophen (TYLENOL) 325 MG tablet Take 650 mg by mouth every 6 (six) hours as needed.        Marland Kitchen amLODipine (NORVASC) 5 MG tablet Take 5 mg by mouth daily.        Marland Kitchen aspirin 81 MG tablet Take 81 mg by mouth daily.        . dabigatran (PRADAXA) 150 MG CAPS Take 1 capsule (150 mg total) by mouth every 12 (twelve) hours.  60 capsule  3  . diltiazem (CARDIZEM) 60 MG tablet Take 60 mg by mouth 3 (three) times daily as needed.       . flecainide (TAMBOCOR) 100 MG tablet Take 1 tablet (100 mg total) by mouth 2 (two) times daily.  60 tablet  12  . lisinopril-hydrochlorothiazide (PRINZIDE,ZESTORETIC) 20-12.5 MG per tablet Take 1 tablet  by mouth daily.        Marland Kitchen NITROSTAT 0.4 MG SL tablet as needed.      Marland Kitchen omeprazole (PRILOSEC) 20 MG capsule Take 20 mg by mouth daily.         No current facility-administered medications for this visit.   Facility-Administered Medications Ordered in Other Visits  Medication Dose Route Frequency Provider Last Rate Last Dose  . gadobenate dimeglumine (MULTIHANCE) injection 20 mL  20 mL Intravenous Once Medication Radiologist        No Known Allergies  History   Social History  . Marital Status: Married    Spouse Name: N/A    Number of Children: 1  . Years of Education: N/A   Occupational History  . 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 Smoking status: Former Smoker -- 1.5 packs/day for 20 years    Types: Cigarettes    Quit date: 05/23/1975  . Smokeless tobacco: Never Used  . Alcohol Use: 7.0 oz/week    14 drink(s) per week     2 drinks per night  . Drug Use: No  . Sexually Active: Not on file  Other Topics Concern  . Not on file   Social History Narrative  . No narrative on file    Family History  Problem Relation Age of Onset  . Hypertension Mother   . Stroke Mother   . Cancer Father   . Arthritis Mother    Physical Exam: Filed Vitals:   12/07/10 1102  BP: 113/69  Pulse: 48  Height: 5\' 11"  (1.803 m)  Weight: 202 lb (91.627 kg)    GEN- The patient is well appearing, alert and oriented x 3 today.   Head- normocephalic, atraumatic Eyes-  Sclera clear, conjunctiva pink Ears- hearing intact Oropharynx- clear Neck- supple, no JVP Lymph- no cervical lymphadenopathy Lungs- Clear to ausculation bilaterally, normal work of breathing Heart- regular rate and rhythm, no murmurs, rubs or gallops, PMI not laterally displaced GI- soft, NT, ND, + BS Extremities- no clubbing, cyanosis, or edema MS- no significant deformity or atrophy Skin- no rash or lesion Psych- euthymic mood, full affect Neuro- strength and sensation  are intact  ekg today reveals sinus bradycardia 48 bpm, otherwise normal ekg  Assessment and Plan:

## 2010-12-07 NOTE — Assessment & Plan Note (Signed)
Doing well s/p ablation 6/12 without further episodes No changes as this time We may stop flecainide if no further afib upon return Continue pradaxa.

## 2010-12-12 NOTE — Discharge Summary (Signed)
Erik, [name] NO.:  [id number]  MEDICAL RECORD NO.:  [id number]  LOCATION:  3004                         FACILITY:  [facility]  PHYSICIAN:  Hillis Range, MD       DATE OF BIRTH:  October 30, 1940  DATE OF ADMISSION:  11/12/2010 DATE OF DISCHARGE:  11/14/2010                              DISCHARGE SUMMARY   PRIMARY CARE PHYSICIAN:  L. Lupe Carney, MD  ELECTROPHYSIOLOGIST:  Hillis Range, MD  PRIMARY DIAGNOSIS:  Transient ischemic attack.  SECONDARY DIAGNOSES: 1. Atrial fibrillation, status post atrial fibrillation ablation on November 12, 2010 by Dr. Johney Frame. 2. Atrial flutter - status post atrial flutter ablation in August 2010     by Dr. Ladona Ridgel. 3. Anticoagulation with Pradaxa. 4. Hypertension. 5. GERD. 6. Dyslipidemia. 7. Basal cell carcinoma of the ear. 8. Status post catheterization in May 2012,     which demonstrated ejection fraction of 65% with nonobstructive     coronary artery disease.  ALLERGIES:  The patient has no known drug allergies.  PROCEDURES THIS ADMISSION: 1. MRI and MRA of the brain and head on November 12, 2010.  This     demonstrated no evidence of acute infarction or other reversible     processes.  MRA of the neck demonstrated normal anatomy without any     stenosis or other vascular pathology.  LABORATORY DATA THIS ADMISSION:  Hemoglobin A1c 5.8.  Total cholesterol 158, triglycerides 105, HDL 48, LDL of 89.  White count 11.2, hemoglobin 13.3, platelets 207.  Potassium 4.2, BUN 14, creatinine 1.13, AST 12, ALT 11.  BRIEF HISTORY OF PRESENT ILLNESS:  Mr. Cuny is a 70 year old male with a longstanding history of atrial arrhythmias status post flutter ablation in 2010 by Dr. Ladona Ridgel who subsequently developed atrial fibrillation.  He failed medical therapy with Rythmol.  He was tried on flecainide and continued to have occasional episodes of atrial fibrillation despite this therapy.  He was then anticoagulated with Pradaxa.  The  patient was evaluated by Dr. Johney Frame in the outpatient setting for treatment options of his atrial fibrillation.  Atrial fibrillation ablation was recommended and carried out on November 11, 2010, by Dr. Johney Frame.  The patient was monitored overnight and was discharged home on the morning of November 12, 2010.  On the afternoon of November 12, 2010, the patient developed transient expressive aphasia and presented to [facility] Emergency Room for evaluation.  HOSPITAL COURSE:  The patient was admitted for observation on November 12, 2010, of his expressive aphasia.  It was felt that he had a TIA. Neurology was consulted who recommended workup with MRI and MRA.  This was obtained and demonstrated no new acute abnormality.  The patient's symptoms of expressive aphasia resolved by the time he was evaluated in the emergency room and did not return during hospitalization.  The patient was examined by Dr. Johney Frame on November 15, 2010.  He did have some transient hypotension.  Dr. Johney Frame did a limited transthoracic echocardiogram, which demonstrated with no effusion and no wall motion abnormalities. Plan is we will hold the patient's lisinopril/hydrochlorothiazide for 3 days and then resume.  The patient also demonstrated recurrent atrial  fibrillation post ablation.  Dr. Johney Frame recommended resuming previous cardiac medications and adding aspirin 81 mg daily.  He was considered to be stable for discharge on November 14, 2010 with early follow up with Dr. Johney Frame.  DISCHARGE INSTRUCTIONS: 1. Increase activity slowly. 2. Keep incisions clean and dry.  FOLLOWUP APPOINTMENTS:  Dr. Johney Frame on November 18, 2010, at 8:30 a.m. with a BMET at that time.  DISCHARGE MEDICATIONS: 1. Aspirin 81 mg daily - this is a new prescription for the patient. 2. Tylenol 325 mg 2 tablets every 4 hours as needed. 3. Nitroglycerin sublingual 0.4 mg as needed. 4. Amlodipine 5 mg daily. 5. Diltiazem 60 mg every hours. 6. Lisinopril/hydrochlorothiazide  20/12.5 mg 1 tablet daily - the     patient should hold this medication for 3 days. 7. Omeprazole 20 mg daily. 8. Pradaxa 150 mg twice daily. 9. Flecainide 100 mg twice daily.  DISPOSITION:  The patient was seen and examined by Dr. Johney Frame on November 15, 2010, considered stable for discharge.  DURATION OF DISCHARGE ENCOUNTER:  35 minutes.     Gypsy Balsam, RN,BSN   ______________________________ Hillis Range, MD    AS/MEDQ  D:  11/14/2010  T:  11/15/2010  Job:  [id number]  cc:   L. Lupe Carney, M.D. Doylene Canning. Ladona Ridgel, MD  Electronically Signed by Gypsy Balsam RNBSN on 11/17/2010 02:36:29 PM Electronically Signed by Hillis Range MD on 12/12/2010 09:57:49 AM

## 2010-12-23 ENCOUNTER — Encounter: Payer: Self-pay | Admitting: Internal Medicine

## 2010-12-29 ENCOUNTER — Other Ambulatory Visit: Payer: Self-pay | Admitting: *Deleted

## 2010-12-29 MED ORDER — AMLODIPINE BESYLATE 5 MG PO TABS: 5.0000 mg | ORAL_TABLET | Freq: Every day | ORAL | Status: DC

## 2011-02-01 ENCOUNTER — Ambulatory Visit (INDEPENDENT_AMBULATORY_CARE_PROVIDER_SITE_OTHER): Payer: Medicare Other | Admitting: Internal Medicine

## 2011-02-01 ENCOUNTER — Encounter: Payer: Self-pay | Admitting: Internal Medicine

## 2011-02-01 DIAGNOSIS — I10 Essential (primary) hypertension: Secondary | ICD-10-CM

## 2011-02-01 DIAGNOSIS — R0602 Shortness of breath: Secondary | ICD-10-CM

## 2011-02-01 DIAGNOSIS — I4891 Unspecified atrial fibrillation: Secondary | ICD-10-CM

## 2011-02-01 MED ORDER — LISINOPRIL 20 MG PO TABS: 20.0000 mg | ORAL_TABLET | Freq: Every day | ORAL | Status: DC

## 2011-02-01 NOTE — Assessment & Plan Note (Signed)
Given dizziness, will stop hctz He will follow his BP

## 2011-02-01 NOTE — Progress Notes (Signed)
The patient presents today for EP followup.  He has done well since his last visit to our clinic.  He has had no further afib.  He denies any further neurologic concerns.  He continues to have SOB and atypical chest pains.  He reports that this is stable and has not worsened over the past 2 years.  He underwent cath 5/12 which revealed nonobstructive disease.  He has noticed frequent orthostatic dizziness. Today, he denies symptoms of orthopnea, PND, lower extremity edema, presyncope, syncope, or neurologic sequela.  The patient feels that he is tolerating medications without difficulties and is otherwise without complaint today.   Past Medical History  Diagnosis Date  . Atrial flutter     s/p RFCA  by GT  . Dyslipidemia   . Hypertension   . Atrial fibrillation     Paroxysmal, s/p PVI 11/11/10  . Basal cell carcinoma of ear   . GERD (gastroesophageal reflux disease)   . CAD (coronary artery disease)     a. cath 5/12: pLAD 25%, D1 25%, EF 65 % (done 2/2 abnormal myoview with inferapical  ischemia)  . TIA (transient ischemic attack)     11/12/10 post afib ablation   Past Surgical History  Procedure Date  . Knee arthroscopy     bilateral  . Atrial flutter ablation     by GT  . Basal cell cancer resection   . Hemorrhoid surgery     Current Outpatient Prescriptions  Medication Sig Dispense Refill  . acetaminophen (TYLENOL) 325 MG tablet Take 650 mg by mouth every 6 (six) hours as needed.        Marland Kitchen amLODipine (NORVASC) 5 MG tablet Take 1 tablet (5 mg total) by mouth daily.  30 tablet  6  . aspirin 81 MG tablet Take 81 mg by mouth daily.        . dabigatran (PRADAXA) 150 MG CAPS Take 1 capsule (150 mg total) by mouth every 12 (twelve) hours.  60 capsule  3  . diltiazem (CARDIZEM) 60 MG tablet Take 60 mg by mouth 3 (three) times daily as needed.       Marland Kitchen NITROSTAT 0.4 MG SL tablet as needed.      Marland Kitchen omeprazole (PRILOSEC) 20 MG capsule Take 20 mg by mouth daily.        Marland Kitchen lisinopril  (PRINIVIL,ZESTRIL) 20 MG tablet Take 1 tablet (20 mg total) by mouth daily.  30 tablet  11   No current facility-administered medications for this visit.   Facility-Administered Medications Ordered in Other Visits  Medication Dose Route Frequency Provider Last Rate Last Dose  . gadobenate dimeglumine (MULTIHANCE) injection 20 mL  20 mL Intravenous Once Medication Radiologist        No Known Allergies  History   Social History  . Marital Status: Married    Spouse Name: N/A    Number of Children: 1  . Years of Education: N/A   Occupational History  . 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 Smoking status: Former Smoker -- 1.5 packs/day for 20 years    Types: Cigarettes    Quit date: 05/23/1975  . Smokeless tobacco: Never Used  . Alcohol Use: 7.0 oz/week    14 drink(s) per week     2 drinks per night  . Drug Use: No  . Sexually Active: Not on file   Other Topics Concern  . Not on file   Social History  Narrative  . No narrative on file    Family History  Problem Relation Age of Onset  . Hypertension Mother   . Stroke Mother   . Cancer Father   . Arthritis Mother    Physical Exam: Filed Vitals:   02/01/11 1007  BP: 118/78  Pulse: 52  Height: 5\' 11"  (1.803 m)  Weight: 206 lb (93.441 kg)    GEN- The patient is well appearing, alert and oriented x 3 today.   Head- normocephalic, atraumatic Eyes-  Sclera clear, conjunctiva pink Ears- hearing intact Oropharynx- clear Neck- supple, no JVP Lymph- no cervical lymphadenopathy Lungs- Clear to ausculation bilaterally, normal work of breathing Heart- regular rate and rhythm, no murmurs, rubs or gallops, PMI not laterally displaced GI- soft, NT, ND, + BS Extremities- no clubbing, cyanosis, or edema MS- no significant deformity or atrophy Skin- no rash or lesion Psych- euthymic mood, full affect Neuro- strength and sensation are intact  ekg today reveals sinus bradycardia  52 bpm, PR 204, LAD, otherwise normal ekg  Assessment and Plan:

## 2011-02-01 NOTE — Assessment & Plan Note (Signed)
Unclear etiology This preceeds his ablation Cath 5/12 reveals no obstructive CAD Consider CPX, will defer further management to Dr Ladona Ridgel.

## 2011-02-01 NOTE — Patient Instructions (Signed)
Your physician recommends that you schedule a follow-up appointment in: 3 months with Dr Ladona Ridgel  Your physician has recommended you make the following change in your medication: stop Flecainide and Stop HCTZ  Will still need to take Lisinopril 20 mg daiy

## 2011-02-01 NOTE — Assessment & Plan Note (Signed)
Doing well s/p PVI without recurrence of afib Stop flecainide today Continue pradaxa

## 2011-02-07 ENCOUNTER — Encounter: Payer: Self-pay | Admitting: Pulmonary Disease

## 2011-02-07 ENCOUNTER — Ambulatory Visit (INDEPENDENT_AMBULATORY_CARE_PROVIDER_SITE_OTHER): Payer: Medicare Other | Admitting: Pulmonary Disease

## 2011-02-07 VITALS — BP 92/60 | HR 60 | Temp 97.5°F | Ht 71.0 in | Wt 208.0 lb

## 2011-02-07 DIAGNOSIS — R0602 Shortness of breath: Secondary | ICD-10-CM

## 2011-02-07 NOTE — Patient Instructions (Signed)
Will schedule for cardiopulmonary exercise test.  Will call you once results available.

## 2011-02-07 NOTE — Progress Notes (Signed)
  Subjective:    Patient ID: Erik [name], male    DOB: [birth year], 70 y.o.   MRN: [id number]  HPI The patient comes in today for followup of persistent dyspnea.  He has had x-rays and pulmonary function studies with no obvious pulmonary abnormalities being found.  He has been evaluated by cardiology, and they have not found a specific etiology for his dyspnea.  He comes in today for further evaluation.  He continues to have significant dyspnea on exertion that is impacting his quality of life, but denies any cough or congestion.  He does admit recurrence of his atrial fibrillation starting yesterday.   Review of Systems  Constitutional: Negative for fever and unexpected weight change.  HENT: Negative for ear pain, nosebleeds, congestion, sore throat, rhinorrhea, sneezing, trouble swallowing, dental problem, postnasal drip and sinus pressure.   Eyes: Negative for redness and itching.  Respiratory: Positive for chest tightness, shortness of breath and wheezing. Negative for cough.   Cardiovascular: Negative for palpitations and leg swelling.  Gastrointestinal: Negative for nausea and vomiting.  Genitourinary: Negative for dysuria.  Musculoskeletal: Negative for joint swelling.  Skin: Negative for rash.  Neurological: Negative for headaches.  Hematological: Does not bruise/bleed easily.  Psychiatric/Behavioral: Negative for dysphoric mood. The patient is not nervous/anxious.        Objective:   Physical Exam Well-developed male in no acute distress Nose without discharge or purulence noted Chest totally clear to auscultation, no wheezes Cardiac with regular rate and rhythm Lower extremities without edema, no cyanosis Alert and orient, moves all 4 extremities.       Assessment & Plan:

## 2011-02-07 NOTE — Assessment & Plan Note (Signed)
The patient has persistent dyspnea on exertion of unknown etiology.  He has had a negative pulmonary and cardiac workup thus far, and therefore I would recommend a cardiopulmonary exercise test.  Patient is agreeable.  Will arrange followup once the results are available.

## 2011-02-08 ENCOUNTER — Telehealth: Payer: Self-pay | Admitting: Internal Medicine

## 2011-02-08 NOTE — Telephone Encounter (Signed)
Pt saw Dr. Johney Frame on the 12th and was taken off flecinide and 4days later he went back into a-fib and he started back on his flecinide and he saw Dr. Shelle Iron and patient is set up for a CPX test nest week just wanted to let you know what was going on.

## 2011-02-15 ENCOUNTER — Ambulatory Visit (HOSPITAL_COMMUNITY): Payer: Medicare Other | Attending: Pulmonary Disease

## 2011-02-15 DIAGNOSIS — R0609 Other forms of dyspnea: Secondary | ICD-10-CM | POA: Insufficient documentation

## 2011-02-15 DIAGNOSIS — R1310 Dysphagia, unspecified: Secondary | ICD-10-CM | POA: Insufficient documentation

## 2011-02-15 DIAGNOSIS — R0602 Shortness of breath: Secondary | ICD-10-CM

## 2011-02-15 DIAGNOSIS — R0989 Other specified symptoms and signs involving the circulatory and respiratory systems: Secondary | ICD-10-CM | POA: Insufficient documentation

## 2011-02-28 DIAGNOSIS — R0602 Shortness of breath: Secondary | ICD-10-CM

## 2011-04-25 ENCOUNTER — Telehealth: Payer: Self-pay | Admitting: Internal Medicine

## 2011-04-25 ENCOUNTER — Ambulatory Visit (INDEPENDENT_AMBULATORY_CARE_PROVIDER_SITE_OTHER): Payer: Medicare Other | Admitting: Internal Medicine

## 2011-04-25 ENCOUNTER — Encounter: Payer: Self-pay | Admitting: Internal Medicine

## 2011-04-25 DIAGNOSIS — I4589 Other specified conduction disorders: Secondary | ICD-10-CM

## 2011-04-25 DIAGNOSIS — I4891 Unspecified atrial fibrillation: Secondary | ICD-10-CM

## 2011-04-25 DIAGNOSIS — I10 Essential (primary) hypertension: Secondary | ICD-10-CM

## 2011-04-25 NOTE — Progress Notes (Signed)
HPI [name] returns today for followup. He is a 70 yo man with a h/o atrial flutter s/p ablation, with a history of shortness of breath and documented chronotropic incompetence. The patient underwent catheter ablation of atrial fibrillation as well. Initially he was off of flecainide, but this drug was restarted when he returned to atrial flutter ablation. The patient has been stable since. He has had no palpitations. He does have dyspnea with exertion and under cardiopulmonary stress testing was found to have a maximum heart rate of 85 beats per minute. Since then the patient has improved. His heart failure symptoms are now class II. 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 He denies syncope or peripheral edema. He denies cough or hemoptysis. No Known Allergies   Current Outpatient Prescriptions  Medication Sig Dispense Refill  . acetaminophen (TYLENOL) 325 MG tablet Take 650 mg by mouth every 6 (six) hours as needed.        Marland Kitchen amLODipine (NORVASC) 5 MG tablet Take 1 tablet (5 mg total) by mouth daily.  30 tablet  6  . dabigatran (PRADAXA) 150 MG CAPS Take 1 capsule (150 mg total) by mouth every 12 (twelve) hours.  60 capsule  3  . flecainide (TAMBOCOR) 100 MG tablet Take 100 mg by mouth daily.        Marland Kitchen lisinopril (PRINIVIL,ZESTRIL) 20 MG tablet Take 1 tablet (20 mg total) by mouth daily.  30 tablet  11  . NITROSTAT 0.4 MG SL tablet as needed.       No current facility-administered medications for this visit.   Facility-Administered Medications Ordered in Other Visits  Medication Dose Route Frequency Provider Last Rate Last Dose  . gadobenate dimeglumine (MULTIHANCE) injection 20 mL  20 mL Intravenous Once Medication Radiologist         Past Medical History  Diagnosis Date  . Atrial flutter     s/p RFCA  by GT  . Dyslipidemia   . Hypertension   . Atrial fibrillation     Paroxysmal, s/p PVI 11/11/10  . Basal cell carcinoma of ear   . GERD (gastroesophageal  reflux disease)   . CAD (coronary artery disease)     a. cath 5/12: pLAD 25%, D1 25%, EF 65 % (done 2/2 abnormal myoview with inferapical  ischemia)  . TIA (transient ischemic attack)     11/12/10 post afib ablation    ROS:   All systems reviewed and negative except as noted in the HPI.   Past Surgical History  Procedure Date  . Knee arthroscopy     bilateral  . Atrial flutter ablation     by GT  . Basal cell cancer resection   . Hemorrhoid surgery      Family History  Problem Relation Age of Onset  . Hypertension Mother   . Stroke Mother   . Cancer Father   . Arthritis Mother      History   Social History  . Marital Status: Married    Spouse Name: N/A    Number of Children: 1  . Years of Education: N/A   Occupational History  . 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 Smoking status: Former Smoker -- 1.5 packs/day for 20 years    Types: Cigarettes    Quit date: 05/23/1975  . Smokeless tobacco: Never Used  . Alcohol Use: 7.0 oz/week    14 drink(s) per week  2 drinks per night  . Drug Use: No  . Sexually Active: Not on file   Other Topics Concern  . Not on file   Social History Narrative  . No narrative on file     BP 110/60  Pulse 56  Ht 5\' 11"  (1.803 m)  Wt 93.441 kg (206 lb)  BMI 28.73 kg/m2  Physical Exam:  Well appearing 70 year old man, NAD HEENT: Unremarkable Neck:  No JVD, no thyromegally Lymphatics:  No adenopathy Back:  No CVA tenderness Lungs:  Clear with no wheezes, rales, or rhonchi. HEART:  Regular rate rhythm, no murmurs, no rubs, no clicks Abd:  soft, positive bowel sounds, no organomegally, no rebound, no guarding Ext:  2 plus pulses, no edema, no cyanosis, no clubbing Skin:  No rashes no nodules Neuro:  CN II through XII intact, motor grossly intact  Assess/Plan:

## 2011-04-25 NOTE — Patient Instructions (Signed)
Your physician wants you to follow-up in: 6 months with Dr Taylor You will receive a reminder letter in the mail two months in advance. If you don't receive a letter, please call our office to schedule the follow-up appointment.  

## 2011-04-25 NOTE — Telephone Encounter (Signed)
New problem Pt said he wanted to talk you about Dr Teddy Spike test results. He forgot to ask while he was here.

## 2011-04-25 NOTE — Telephone Encounter (Signed)
Called patient back and let know Dr Ladona Ridgel wants to do nothing at this point because he is doing so well.  If things change he is to call us and we can discuss options at that time

## 2011-04-26 ENCOUNTER — Encounter: Payer: Self-pay | Admitting: Internal Medicine

## 2011-04-26 DIAGNOSIS — I4589 Other specified conduction disorders: Secondary | ICD-10-CM | POA: Insufficient documentation

## 2011-04-26 NOTE — Assessment & Plan Note (Signed)
Today, the patient appears to be minimally symptomatic. I have recommended a period of watchful waiting.

## 2011-04-26 NOTE — Assessment & Plan Note (Signed)
The patient appears to be maintaining sinus rhythm after catheter ablation and resumption of flecainide therapy. He denies palpitations. We'll continue his current medications.

## 2011-04-26 NOTE — Assessment & Plan Note (Signed)
Blood pressure today is well controlled. He will maintain a low-sodium diet and continue his medical therapy

## 2011-05-04 ENCOUNTER — Other Ambulatory Visit: Payer: Self-pay | Admitting: Family Medicine

## 2011-05-19 ENCOUNTER — Other Ambulatory Visit: Payer: Self-pay

## 2011-05-19 MED ORDER — DABIGATRAN ETEXILATE MESYLATE 150 MG PO CAPS: 150.0000 mg | ORAL_CAPSULE | Freq: Two times a day (BID) | ORAL | Status: DC

## 2011-05-19 NOTE — Telephone Encounter (Signed)
Requested Prescriptions   Signed Prescriptions Disp Refills  . dabigatran (PRADAXA) 150 MG CAPS 60 capsule 6    Sig: Take 1 capsule (150 mg total) by mouth every 12 (twelve) hours.    Authorizing Provider: Lewayne Bunting    Ordering User: Lacie Scotts   E-scribe to [facility].

## 2011-08-15 ENCOUNTER — Other Ambulatory Visit: Payer: Self-pay | Admitting: *Deleted

## 2011-08-15 MED ORDER — AMLODIPINE BESYLATE 5 MG PO TABS: 5.0000 mg | ORAL_TABLET | Freq: Every day | ORAL | Status: DC

## 2011-10-25 ENCOUNTER — Telehealth: Payer: Self-pay | Admitting: Internal Medicine

## 2011-10-25 DIAGNOSIS — I4891 Unspecified atrial fibrillation: Secondary | ICD-10-CM

## 2011-10-25 MED ORDER — FLECAINIDE ACETATE 100 MG PO TABS: 100.0000 mg | ORAL_TABLET | Freq: Two times a day (BID) | ORAL | Status: DC

## 2011-10-25 NOTE — Telephone Encounter (Signed)
New msg Pt called and said his heart rate has gone up today to 124. He wants to talk to a nurse.

## 2011-10-25 NOTE — Telephone Encounter (Signed)
Pt calls today b/c he has been "feeling terrible" since Sunday. Sunday:  "heart was jumping all over the place" Monday:         BP 86/47                  HR 61-81  "feel terrible" Tuesday:        BP 96/77 & 96/67    HR 114-115 Wednesday:   BP 94/75 & 98/76    HR 122-124  Pt has not taken flecainide, amlodipine, or lisinopril today. Dr. Ladona Ridgel has reviewed and recommended pt take 2 Flecainide this am and 1 this pm Starting tomorrow: 100 mg Flecainide bid Stop starting today: Amlodipine Stop starting today: Lisinopril  Pt is aware and has taken 2 Flecainide (200mg ) while he was on the phone with me. New prescription called in. Pt also due for 59month follow-up with Dr. Ladona Ridgel. Appt scheduled for 11/17/11 at 1000am. Pt is aware. Advised pt is continued to feel worse to call 911. Mylo Red RN

## 2011-10-27 ENCOUNTER — Telehealth: Payer: Self-pay | Admitting: Internal Medicine

## 2011-10-27 NOTE — Telephone Encounter (Signed)
Patient called states he is still having fast heart beat 112 to 123 beats/min.B/P low ranging 102/72,111/89,88/70.States he is tired no energy.Spoke with Dr.Taylor he advised to continue Flecainide 100 mg twice daily,hold Amlodipine,decrease Lisinopril 20 mg 1/2 tablet daily.Dr.Taylor advised if heart beat continues to be elevated go to [facility] ER.States patient's heart beat is out of rhythm and only option is to shock heart back in rhythm.Keep scheduled appointment with Dr.Taylor 11/17/11.

## 2011-10-27 NOTE — Telephone Encounter (Signed)
Fu msg Pt said his heart rate is still high today. He wants to know what else he can do

## 2011-10-30 ENCOUNTER — Emergency Department (HOSPITAL_COMMUNITY)
Admission: EM | Admit: 2011-10-30 | Discharge: 2011-10-30 | Disposition: A | Discharge status: Home / Self Care | Payer: Medicare Other | Attending: Emergency Medicine | Admitting: Emergency Medicine

## 2011-10-30 ENCOUNTER — Encounter (HOSPITAL_COMMUNITY): Payer: Self-pay | Admitting: Emergency Medicine

## 2011-10-30 ENCOUNTER — Other Ambulatory Visit: Payer: Self-pay

## 2011-10-30 ENCOUNTER — Emergency Department (HOSPITAL_COMMUNITY): Payer: Medicare Other

## 2011-10-30 DIAGNOSIS — I2510 Atherosclerotic heart disease of native coronary artery without angina pectoris: Secondary | ICD-10-CM | POA: Insufficient documentation

## 2011-10-30 DIAGNOSIS — I480 Paroxysmal atrial fibrillation: Secondary | ICD-10-CM | POA: Insufficient documentation

## 2011-10-30 DIAGNOSIS — Z79899 Other long term (current) drug therapy: Secondary | ICD-10-CM | POA: Insufficient documentation

## 2011-10-30 DIAGNOSIS — Z7901 Long term (current) use of anticoagulants: Secondary | ICD-10-CM | POA: Insufficient documentation

## 2011-10-30 DIAGNOSIS — I4892 Unspecified atrial flutter: Secondary | ICD-10-CM | POA: Insufficient documentation

## 2011-10-30 DIAGNOSIS — K219 Gastro-esophageal reflux disease without esophagitis: Secondary | ICD-10-CM | POA: Insufficient documentation

## 2011-10-30 DIAGNOSIS — G459 Transient cerebral ischemic attack, unspecified: Secondary | ICD-10-CM | POA: Insufficient documentation

## 2011-10-30 DIAGNOSIS — Z85828 Personal history of other malignant neoplasm of skin: Secondary | ICD-10-CM | POA: Insufficient documentation

## 2011-10-30 DIAGNOSIS — I10 Essential (primary) hypertension: Secondary | ICD-10-CM | POA: Insufficient documentation

## 2011-10-30 DIAGNOSIS — E785 Hyperlipidemia, unspecified: Secondary | ICD-10-CM | POA: Insufficient documentation

## 2011-10-30 DIAGNOSIS — Z8673 Personal history of transient ischemic attack (TIA), and cerebral infarction without residual deficits: Secondary | ICD-10-CM | POA: Insufficient documentation

## 2011-10-30 LAB — BASIC METABOLIC PANEL
CO2: 21 mEq/L (ref 19–32)
Calcium: 9.5 mg/dL (ref 8.4–10.5)
Creatinine, Ser: 1.3 mg/dL (ref 0.50–1.35)
GFR calc non Af Amer: 54 mL/min — ABNORMAL LOW (ref >90)
Glucose, Bld: 99 mg/dL (ref 70–99)

## 2011-10-30 LAB — CBC
Hemoglobin: 16 g/dL (ref 13.0–17.0)
MCH: 32.5 pg (ref 26.0–34.0)
MCHC: 34.5 g/dL (ref 30.0–36.0)
MCV: 94.1 fL (ref 78.0–100.0)
RBC: 4.93 MIL/uL (ref 4.22–5.81)

## 2011-10-30 LAB — CARDIAC PANEL(CRET KIN+CKTOT+MB+TROPI): Relative Index: INVALID (ref 0.0–2.5)

## 2011-10-30 NOTE — Discharge Instructions (Signed)
Dr. Jenel Lucks office will call you tomorrow morning with an appointment time for your echocardiogram.

## 2011-10-30 NOTE — ED Notes (Signed)
Pt received to POD 2 with c/o palpitations onset last Sunday with shortness of breath on exertion, chest heaviness, dizziness. Pt is A/A/O x 4, skin is warm and dry, respiration is even and unlabored. Pt was changed into a hospital, attached to the cardiac monitor. Dr. Karma Ganja is at the bedside.

## 2011-10-30 NOTE — ED Provider Notes (Signed)
History     CSN: [id number]  Arrival date & time 10/30/11  4098   First MD Initiated Contact with Patient 10/30/11 [phone]      Chief Complaint  Patient presents with  . Palpitations    (Consider location/radiation/quality/duration/timing/severity/associated sxs/prior treatment) HPI Pt with hx of atrial flutter presents with c/o palpitations.  He has been measuring HR and last week it was 120s.  Yesterday and today has been in 90s.  He does feel generally fatigued.  No fever/chills.  No sob or nausea. Feels heart pounding "thumping and jumping".  Feels HR increase with exertion.  Pt was instructed by cardiologist to increase his flecainide dose, but he states this has not helped at all.  Did not take any of his meds yet today.  There are no other associated systemic symptoms.  There are no other alleviating or modifying factors.   Past Medical History  Diagnosis Date  . Atrial flutter     s/p RFCA  by GT  . Dyslipidemia   . Hypertension   . Atrial fibrillation     Paroxysmal, s/p PVI 11/11/10  . Basal cell carcinoma of ear   . GERD (gastroesophageal reflux disease)   . CAD (coronary artery disease)     a. cath 5/12: pLAD 25%, D1 25%, EF 65 % (done 2/2 abnormal myoview with inferapical  ischemia)  . TIA (transient ischemic attack)     11/12/10 post afib ablation    Past Surgical History  Procedure Date  . Knee arthroscopy     bilateral  . Atrial flutter ablation     by GT  . Basal cell cancer resection   . Hemorrhoid surgery   . Eye surgery   . Tee without cardioversion 10/31/2011    Procedure: TRANSESOPHAGEAL ECHOCARDIOGRAM (TEE);  Surgeon: Dolores Patty, MD;  Location: [facility] ENDOSCOPY;  Service: Cardiovascular;  Laterality: N/A;  . Cardioversion 10/31/2011    Procedure: CARDIOVERSION;  Surgeon: Dolores Patty, MD;  Location: [facility] ENDOSCOPY;  Service: Cardiovascular;  Laterality: N/A;    Family History  Problem Relation Age of Onset  . Hypertension Mother   . Stroke  Mother   . Cancer Father   . Arthritis Mother     History  Substance Use Topics  . Smoking status: Former Smoker -- 1.5 packs/day for 20 years    Types: Cigarettes    Quit date: 05/23/1975  . Smokeless tobacco: Never Used  . Alcohol Use: 7.0 oz/week    14 drink(s) per week     2 drinks per night      Review of Systems ROS reviewed and all otherwise negative except for mentioned in HPI  Allergies  Review of patient's allergies indicates no known allergies.  Home Medications   Current Outpatient Rx  Name Route Sig Dispense Refill  . ACETAMINOPHEN 325 MG PO TABS Oral Take 650 mg by mouth every 6 (six) hours as needed. For pain    . DABIGATRAN ETEXILATE MESYLATE 150 MG PO CAPS Oral Take 1 capsule (150 mg total) by mouth every 12 (twelve) hours. 60 capsule 6  . FLECAINIDE ACETATE 100 MG PO TABS Oral Take 1 tablet (100 mg total) by mouth 2 (two) times daily. 60 tablet 6  . LISINOPRIL 20 MG PO TABS Oral Take 10 mg by mouth 2 (two) times daily.    Marland Kitchen NITROSTAT 0.4 MG SL SUBL  as needed.      BP 134/89  Pulse 98  Temp 98.2 F (36.8 C) (Oral)  Resp 16  SpO2 98% Vitals reviewed Physical Exam Physical Examination: General appearance - alert, well appearing, and in no distress Mental status - alert, oriented to person, place, and time Eyes - no conjunctival injection or scleral icterus Mouth - mucous membranes moist, pharynx normal without lesions Chest - clear to auscultation, no wheezes, rales or rhonchi, symmetric air entry Heart - normal rate, regular rhythm, normal S1, S2, no murmurs, rubs, clicks or gallops Abdomen - soft, nontender, nondistended, no masses or organomegaly Extremities - peripheral pulses normal, no pedal edema, no clubbing or cyanosis Skin - normal coloration and turgor, no rashes  ED Course  Procedures (including critical care time)   Date: 10/30/2011  Rate: 93  Rhythm: atrial flutter with variable AV block  QRS Axis: right  Intervals:  indeterminate  ST/T Wave abnormalities: indeterminate  Conduction Disutrbances:none  Narrative Interpretation:   Old EKG Reviewed: changes noted  9:52 AM d/w [facility] cardiology- they will see patient for consultation in the ED  4:03 PM continuing to await cardiology consult- repaged Cardmaster at approx 2:30pm and she is aware of patient and states he is on their list.  Pt continues to remain stable.   Labs Reviewed  BASIC METABOLIC PANEL - Abnormal; Notable for the following:    GFR calc non Af Amer 54 (*)     GFR calc Af Amer 63 (*)     All other components within normal limits  CARDIAC PANEL(CRET KIN+CKTOT+MB+TROPI) - Abnormal; Notable for the following:    CK, MB 4.6 (*)     All other components within normal limits  CBC  MAGNESIUM  LAB REPORT - SCANNED   No results found.   1. Atrial flutter       MDM  Pt presenting with c/o palpitations.  He has a hx of atrial flutter- states he has been feeling weak and is very aware of his palpitations and changes in heart rate.  Labs, EKG obtained.  Pt normotensive and having no chest pain and denies syncope.  Cardiology consulted and disposition will be based upon their recommendations.          Ethelda Chick, MD 11/01/11 1050

## 2011-10-30 NOTE — Consult Note (Signed)
 ELECTROPHYSIOLOGY CONSULT NOTE  Patient ID: Erik [name] MRN: [id number], DOB/AGE: 01/21/1941   Admit date: 10/30/2011 Date of Consult: 10/30/2011  Primary Physician: Lewis Mitchell, MD Primary Electrophysiologist: Gregg Taylor, MD Reason for Consultation: Palpitations  History of Present Illness Erik [name] is a 70 year old gentleman with history of atrial flutter s/p CTI ablation by Dr [name], atrial fibrillation s/p PVI June 2012 by Dr Jearlean [name] and HTN who presents to the ED with palpitations. He reports frequent palpitations, feeling "thumping and jumping" in his chest x 1 week. He has measured his pulse and states it was running as high as 124. He reports fatigue but no other associated symptoms. He denies chest pain or shortness of breath. He denies dizziness, near syncope or syncope. He was instructed to increase his flecainide to 100mg BID but this has not helped. He is not certain that he has been fully compliant with pradaxa recently.  Past Medical History  Diagnosis Date  . Atrial flutter     s/p RFCA  by GT  . Dyslipidemia   . Hypertension   . Atrial fibrillation     Paroxysmal, s/p PVI 11/11/10  . Basal cell carcinoma of ear   . GERD (gastroesophageal reflux disease)   . CAD (coronary artery disease)     a. cath 5/12: pLAD 25%, D1 25%, EF 65 % (done 2/2 abnormal myoview with inferapical  ischemia)  . TIA (transient ischemic attack)     11/12/10 post afib ablation    Past Surgical History  Procedure Date  . Knee arthroscopy     bilateral  . Atrial flutter ablation     by GT  . Basal cell cancer resection   . Hemorrhoid surgery     Allergies/Intolerances No Known Allergies  Current Home Medications    acetaminophen 325 MG tablet   Commonly known as: TYLENOL      dabigatran 150 MG Caps   Commonly known as: PRADAXA   Take 1 capsule (150 mg total) by mouth every 12 (twelve) hours.      flecainide 100 MG tablet   Commonly known as: TAMBOCOR   Take 1 tablet  (100 mg total) by mouth 2 (two) times daily.      lisinopril 20 MG tablet   Commonly known as: PRINIVIL,ZESTRIL      nitroglycerin 0.4 MG SL tablet   Generic drug: nitroGLYCERIN      Family History Problem Relation Age of Onset  . Hypertension Mother   . Stroke Mother   . Cancer Father   . Arthritis Mother     Social History Social History  . Marital Status: Married    Spouse Name: N/A    Number of Children: 1  . Years of Education: N/A   Occupational History  . 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 Smoking status: Former Smoker -- 1.5 packs/day for 20 years    Types: Cigarettes    Quit date: 05/23/1975  . Smokeless tobacco: Never Used  . Alcohol Use: 7.0 oz/week    14 drink(s) per week     2 drinks per night  . Drug Use: No  . Sexually Active: Not on file   Review of Systems General:  No chills, fever, night sweats or weight changes  Cardiovascular:  No chest pain, dyspnea on exertion, edema, orthopnea, palpitations, paroxysmal nocturnal dyspnea Dermatological: No rash, lesions or masses Respiratory: No cough, dyspnea Urologic: No hematuria,   dysuria Abdominal:   No nausea, vomiting, diarrhea, bright red blood per rectum, melena, or hematemesis Neurologic:  No visual changes, weakness, changes in mental status All other systems reviewed and are otherwise negative except as noted above.  Physical Exam Blood pressure 138/90, pulse 91, temperature 98.2 F (36.8 C), temperature source Oral, resp. rate 10, SpO2 99.00%.  General: Well developed, well appearing 70 year old male in no acute distress. HEENT: Normocephalic, atraumatic. EOMs intact. Sclera nonicteric. Oropharynx clear.  Neck: Supple without bruits. No JVD. Lungs:  Respirations regular and unlabored, CTA bilaterally. Heart: iRRR. S1, S2 present. No murmurs, rub, S3 or S4. Abdomen: Soft, non-tender, non-distended. BS present x 4 quadrants. No hepatosplenomegaly.    Extremities: No clubbing, cyanosis or edema. DP/PT/Radials 2+ and equal bilaterally. Psych: Normal affect. Neuro: Alert and oriented X 3. Moves all extremities spontaneously.   Labs  Basename 10/30/11 1014  CKTOTAL 88  CKMB 4.6*  TROPONINI <0.30   Lab Results  Component Value Date   WBC 7.5 10/30/2011   HGB 16.0 10/30/2011   HCT 46.4 10/30/2011   MCV 94.1 10/30/2011   PLT 277 10/30/2011    Lab 10/30/11 1014  NA 135  K 4.4  CL 101  CO2 21  BUN 22  CREATININE 1.30  CALCIUM 9.5  PROT --  BILITOT --  ALKPHOS --  ALT --  AST --  GLUCOSE 99   No components found with this basename: MAGNESIUM No results found for this basename: TSH,T4TOTAL,FREET3,T3FREE,THYROIDAB in the last 72 hours  Radiology/Studies Dg Chest 2 View  10/30/2011  *RADIOLOGY REPORT*  Clinical Data: Palpitations.  CHEST - 2 VIEW  Comparison: 10/07/2010  Findings: The lungs are clear without focal infiltrate, edema, pneumothorax or pleural effusion. The cardiopericardial silhouette is within normal limits for size. Imaged bony structures of the thorax are intact.  IMPRESSION: Stable.  No acute cardiopulmonary process.  Original Report Authenticated By: ERIC A. MANSELL, M.D.   12-lead ECG shows atypical atrial flutter  Assessment and Plan 1. Atypical atrial flutter - symptomatic; on flecainide and Pradaxa; discussed treatment options with Erik [name]; consider DCCV to restore sinus rhythm  Signed, EDMISTEN, BROOKE, PA-C 10/30/2011, 1:55 PM   I have seen, examined the patient, and reviewed the above assessment and plan.  The patient is well known to me.  He now presents with tachypalpitations and is in atypical atrial flutter with a controlled ventricular rate (90s).  He is "not certain" that he has been fully compliant with his pradaxa.  He is clear that he took it yesterday but thinks he may have missed several doses over the past few weeks.  Given his history of TIA and questionable compliance with pradaxa  recently, I think that he will require TEE prior to cardioversion.  Unfortunately, our endoscopy lab has closed for the day. I will therefore have him scheduled for elective TEE guided cardioversion tomorrow. Risks of the procedure are discussed and he is willing to proceed.  He will continue pradaxa and flecainide in the interim.  I have been very clear that he should take both doses of pradaxa today and also take his dose as scheduled tomorrow am. As he is clinically very stable and wants to go home, he can be safely discharged from the ER this evening.  My office will call with the timing of his TEE guided cardioversion in the am.  He is instructed to be NPO after midnight.  I anticipate that he will continue pradaxa and flecainide 100mg BID   following cardioversion tomorrow.  Co Sign: Eames Dibiasio, MD 10/30/2011 4:30 PM  

## 2011-10-30 NOTE — ED Provider Notes (Signed)
Dr. Johney Frame has seen the patient and plans to cardiovert him. However, he may have missed a dose of the back since we will be discharged and have a TEE done as an outpatient tomorrow.  Dione Booze, MD 10/30/11 [phone]

## 2011-10-30 NOTE — ED Notes (Signed)
Palpitations x 8 days  Cards  Dr is taylor has hx of a fib has had some sob.

## 2011-10-30 NOTE — ED Notes (Addendum)
Dr. Johney Frame came to see the patient. Patient took 1 tablet of his Pradaxa per Dr. Eliot Ford order.

## 2011-10-31 ENCOUNTER — Encounter (HOSPITAL_COMMUNITY): Payer: Self-pay

## 2011-10-31 ENCOUNTER — Other Ambulatory Visit: Payer: Self-pay | Admitting: Cardiology

## 2011-10-31 ENCOUNTER — Encounter (HOSPITAL_COMMUNITY)
Admission: AD | Disposition: A | Discharge status: Hospice | Payer: Self-pay | Source: Ambulatory Visit | Attending: Internal Medicine

## 2011-10-31 ENCOUNTER — Ambulatory Visit (HOSPITAL_COMMUNITY)
Admission: AD | Admit: 2011-10-31 | Discharge: 2011-10-31 | Disposition: A | Discharge status: Hospice | Payer: Medicare Other | Source: Ambulatory Visit | Attending: Internal Medicine | Admitting: Internal Medicine

## 2011-10-31 ENCOUNTER — Encounter (HOSPITAL_COMMUNITY): Payer: Self-pay | Admitting: Critical Care Medicine

## 2011-10-31 DIAGNOSIS — Z8673 Personal history of transient ischemic attack (TIA), and cerebral infarction without residual deficits: Secondary | ICD-10-CM | POA: Insufficient documentation

## 2011-10-31 DIAGNOSIS — I4892 Unspecified atrial flutter: Secondary | ICD-10-CM

## 2011-10-31 DIAGNOSIS — E785 Hyperlipidemia, unspecified: Secondary | ICD-10-CM | POA: Insufficient documentation

## 2011-10-31 DIAGNOSIS — I2510 Atherosclerotic heart disease of native coronary artery without angina pectoris: Secondary | ICD-10-CM | POA: Insufficient documentation

## 2011-10-31 DIAGNOSIS — K219 Gastro-esophageal reflux disease without esophagitis: Secondary | ICD-10-CM | POA: Insufficient documentation

## 2011-10-31 DIAGNOSIS — I10 Essential (primary) hypertension: Secondary | ICD-10-CM | POA: Insufficient documentation

## 2011-10-31 HISTORY — PX: TEE WITHOUT CARDIOVERSION: SHX5443

## 2011-10-31 HISTORY — PX: CARDIOVERSION: SHX1299

## 2011-10-31 SURGERY — ECHOCARDIOGRAM, TRANSESOPHAGEAL
Anesthesia: Moderate Sedation

## 2011-10-31 SURGERY — CARDIOVERSION
Anesthesia: Monitor Anesthesia Care

## 2011-10-31 MED ORDER — FENTANYL CITRATE 0.05 MG/ML IJ SOLN
INTRAMUSCULAR | Status: DC | PRN
  Administered 2011-10-31 (×3): 25 ug via INTRAVENOUS

## 2011-10-31 MED ORDER — BUTAMBEN-TETRACAINE-BENZOCAINE 2-2-14 % EX AERO
INHALATION_SPRAY | CUTANEOUS | Status: DC | PRN
  Administered 2011-10-31: 2 via TOPICAL

## 2011-10-31 MED ORDER — MIDAZOLAM HCL 10 MG/2ML IJ SOLN
INTRAMUSCULAR | Status: AC
  Filled 2011-10-31: qty 2

## 2011-10-31 MED ORDER — FENTANYL CITRATE 0.05 MG/ML IJ SOLN
INTRAMUSCULAR | Status: AC
  Filled 2011-10-31: qty 2

## 2011-10-31 MED ORDER — SODIUM CHLORIDE 0.45 % IV SOLN: INTRAVENOUS | Status: DC

## 2011-10-31 MED ORDER — MIDAZOLAM HCL 10 MG/2ML IJ SOLN: 10.0000 mg | Freq: Once | INTRAMUSCULAR | Status: DC

## 2011-10-31 MED ORDER — MIDAZOLAM HCL 10 MG/2ML IJ SOLN
INTRAMUSCULAR | Status: DC | PRN
  Administered 2011-10-31 (×4): 2 mg via INTRAVENOUS

## 2011-10-31 MED ORDER — BENZOCAINE 20 % MT SOLN
1.0000 "application " | OROMUCOSAL | Status: DC | PRN
  Filled 2011-10-31: qty 57

## 2011-10-31 MED ORDER — FENTANYL CITRATE 0.05 MG/ML IJ SOLN: 250.0000 ug | Freq: Once | INTRAMUSCULAR | Status: DC

## 2011-10-31 NOTE — CV Procedure (Signed)
     DIRECT CURRENT CARDIOVERSION  NAME:  Erik [name]   MRN: [id number] DOB:  [birth year]   ADMIT DATE: 10/31/2011   INDICATIONS: Atrial flutter   PROCEDURE:   Informed consent was obtained prior to the procedure. The risks, benefits and alternatives for the procedure were discussed and the patient comprehended these risks. Once an appropriate time out was taken, the patient had the defibrillator pads placed in the anterior and posterior position.  TEE confirmed no evidence of LAA clot.   Once an appropriate level of sedation was achieved, the patient received a single biphasic, synchronized 150J shock with prompt conversion to sinus rhythm. No apparent complications.   CONLCUSION:   1.  Succesful DC-CV

## 2011-10-31 NOTE — CV Procedure (Addendum)
    TRANSESOPHAGEAL ECHOCARDIOGRAM   NAME:  Erik [name]   MRN: [id number] DOB:  1940/10/25   ADMIT DATE: 10/31/2011  INDICATIONS:   PROCEDURE:   Informed consent was obtained prior to the procedure. The risks, benefits and alternatives for the procedure were discussed and the patient comprehended these risks.  Risks include, but are not limited to, cough, sore throat, vomiting, nausea, somnolence, esophageal and stomach trauma or perforation, bleeding, low blood pressure, aspiration, pneumonia, infection, trauma to the teeth and death.    After a procedural time-out, the patient was given 11 mg versed and 100 mcg fentanyl for moderate sedation.  The oropharynx was anesthetized cetacaine spray.  The transesophageal probe was inserted in the esophagus and stomach without difficulty and multiple views were obtained.      COMPLICATIONS:    There were no immediate complications.  FINDINGS:  LEFT VENTRICLE: EF = 50-55% No regional wall motion abnormalities.   RIGHT VENTRICLE: Normal  LEFT ATRIUM: Dilated  LEFT ATRIAL APPENDAGE: No clot. Emptying velocity 105 cm/s  RIGHT ATRIUM: Normal  AORTIC VALVE:  Trileaflet. No AI/AS  MITRAL VALVE:  Normal. Mild MR  TRICUSPID VALVE: Normal No TR  PULMONIC VALVE: Not well visualized  INTERATRIAL SEPTUM: Mildly anuerysmal. No PFO by Doppler or bubble study  PERICARDIUM: Trivial effusion   DESCENDING AORTA: Mild plaque

## 2011-10-31 NOTE — Interval H&P Note (Signed)
History and Physical Interval Note:  10/31/2011 3:46 PM  Coda C Tirone  has presented today for surgery, with the diagnosis of a-flutter  The various methods of treatment have been discussed with the patient and family. After consideration of risks, benefits and other options for treatment, the patient has consented to  Procedure(s) (LRB): TRANSESOPHAGEAL ECHOCARDIOGRAM (TEE) (N/A) CARDIOVERSION (N/A) as a surgical intervention .  The patients' history has been reviewed, patient examined, no change in status, stable for surgery.  I have reviewed the patients' chart and labs.  Questions were answered to the patient's satisfaction.    Patient reports taking Pradaxa bid for at least past 2 days. Missed dose this am but we gave him a dose about 3 hours prior to procedure. Discussed need for ongoing bid therapy for minimum of one month post procedure to minimize risk of stroke.  Jomel Whittlesey

## 2011-10-31 NOTE — H&P (View-Only) (Signed)
ELECTROPHYSIOLOGY CONSULT NOTE  Patient ID: Erik [name] MRN: [id number], DOB/AGE: 1940-11-26   Admit date: 10/30/2011 Date of Consult: 10/30/2011  Primary Physician: Melba Coon, MD Primary Electrophysiologist: Lewayne Bunting, MD Reason for Consultation: Palpitations  History of Present Illness Erik [name] is a 71 year old gentleman with history of atrial flutter s/p CTI ablation by Dr Ladona Ridgel, atrial fibrillation s/p PVI June 2012 by Dr Johney Frame and HTN who presents to the ED with palpitations. He reports frequent palpitations, feeling "thumping and jumping" in his chest x 1 week. He has measured his pulse and states it was running as high as 124. He reports fatigue but no other associated symptoms. He denies chest pain or shortness of breath. He denies dizziness, near syncope or syncope. He was instructed to increase his flecainide to 100mg  BID but this has not helped. He is not certain that he has been fully compliant with pradaxa recently.  Past Medical History  Diagnosis Date  . Atrial flutter     s/p RFCA  by GT  . Dyslipidemia   . Hypertension   . Atrial fibrillation     Paroxysmal, s/p PVI 11/11/10  . Basal cell carcinoma of ear   . GERD (gastroesophageal reflux disease)   . CAD (coronary artery disease)     a. cath 5/12: pLAD 25%, D1 25%, EF 65 % (done 2/2 abnormal myoview with inferapical  ischemia)  . TIA (transient ischemic attack)     11/12/10 post afib ablation    Past Surgical History  Procedure Date  . Knee arthroscopy     bilateral  . Atrial flutter ablation     by GT  . Basal cell cancer resection   . Hemorrhoid surgery     Allergies/Intolerances No Known Allergies  Current Home Medications    acetaminophen 325 MG tablet   Commonly known as: TYLENOL      dabigatran 150 MG Caps   Commonly known as: PRADAXA   Take 1 capsule (150 mg total) by mouth every 12 (twelve) hours.      flecainide 100 MG tablet   Commonly known as: TAMBOCOR   Take 1 tablet  (100 mg total) by mouth 2 (two) times daily.      lisinopril 20 MG tablet   Commonly known as: PRINIVIL,ZESTRIL      nitroglycerin 0.4 MG SL tablet   Generic drug: nitroGLYCERIN      Family History Problem Relation Age of Onset  . Hypertension Mother   . Stroke Mother   . Cancer Father   . Arthritis Mother     Social History Social History  . Marital Status: Married    Spouse Name: N/A    Number of Children: 1  . Years of Education: N/A   Occupational History  . 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 Smoking status: Former Smoker -- 1.5 packs/day for 20 years    Types: Cigarettes    Quit date: 05/23/1975  . Smokeless tobacco: Never Used  . Alcohol Use: 7.0 oz/week    14 drink(s) per week     2 drinks per night  . Drug Use: No  . Sexually Active: Not on file   Review of Systems General:  No chills, fever, night sweats or weight changes  Cardiovascular:  No chest pain, dyspnea on exertion, edema, orthopnea, palpitations, paroxysmal nocturnal dyspnea Dermatological: No rash, lesions or masses Respiratory: No cough, dyspnea Urologic: No hematuria,  dysuria Abdominal:   No nausea, vomiting, diarrhea, bright red blood per rectum, melena, or hematemesis Neurologic:  No visual changes, weakness, changes in mental status All other systems reviewed and are otherwise negative except as noted above.  Physical Exam Blood pressure 138/90, pulse 91, temperature 98.2 F (36.8 C), temperature source Oral, resp. rate 10, SpO2 99.00%.  General: Well developed, well appearing 71 year old male in no acute distress. HEENT: Normocephalic, atraumatic. EOMs intact. Sclera nonicteric. Oropharynx clear.  Neck: Supple without bruits. No JVD. Lungs:  Respirations regular and unlabored, CTA bilaterally. Heart: iRRR. S1, S2 present. No murmurs, rub, S3 or S4. Abdomen: Soft, non-tender, non-distended. BS present x 4 quadrants. No hepatosplenomegaly.    Extremities: No clubbing, cyanosis or edema. DP/PT/Radials 2+ and equal bilaterally. Psych: Normal affect. Neuro: Alert and oriented X 3. Moves all extremities spontaneously.   Labs  Basename 10/30/11 1014  CKTOTAL 88  CKMB 4.6*  TROPONINI <0.30   Lab Results  Component Value Date   WBC 7.5 10/30/2011   HGB 16.0 10/30/2011   HCT 46.4 10/30/2011   MCV 94.1 10/30/2011   PLT 277 10/30/2011    Lab 10/30/11 1014  NA 135  K 4.4  CL 101  CO2 21  BUN 22  CREATININE 1.30  CALCIUM 9.5  PROT --  BILITOT --  ALKPHOS --  ALT --  AST --  GLUCOSE 99   No components found with this basename: MAGNESIUM No results found for this basename: TSH,T4TOTAL,FREET3,T3FREE,THYROIDAB in the last 72 hours  Radiology/Studies Dg Chest 2 View  10/30/2011  *RADIOLOGY REPORT*  Clinical Data: Palpitations.  CHEST - 2 VIEW  Comparison: 10/07/2010  Findings: The lungs are clear without focal infiltrate, edema, pneumothorax or pleural effusion. The cardiopericardial silhouette is within normal limits for size. Imaged bony structures of the thorax are intact.  IMPRESSION: Stable.  No acute cardiopulmonary process.  Original Report Authenticated By: ERIC A. MANSELL, M.D.   12-lead ECG shows atypical atrial flutter  Assessment and Plan 1. Atypical atrial flutter - symptomatic; on flecainide and Pradaxa; discussed treatment options with Erik [name]; consider DCCV to restore sinus rhythm  Signed, EDMISTEN, BROOKE, PA-C 10/30/2011, 1:55 PM   I have seen, examined the patient, and reviewed the above assessment and plan.  The patient is well known to me.  He now presents with tachypalpitations and is in atypical atrial flutter with a controlled ventricular rate (90s).  He is "not certain" that he has been fully compliant with his pradaxa.  He is clear that he took it yesterday but thinks he may have missed several doses over the past few weeks.  Given his history of TIA and questionable compliance with pradaxa  recently, I think that he will require TEE prior to cardioversion.  Unfortunately, our endoscopy lab has closed for the day. I will therefore have him scheduled for elective TEE guided cardioversion tomorrow. Risks of the procedure are discussed and he is willing to proceed.  He will continue pradaxa and flecainide in the interim.  I have been very clear that he should take both doses of pradaxa today and also take his dose as scheduled tomorrow am. As he is clinically very stable and wants to go home, he can be safely discharged from the ER this evening.  My office will call with the timing of his TEE guided cardioversion in the am.  He is instructed to be NPO after midnight.  I anticipate that he will continue pradaxa and flecainide 100mg  BID  following cardioversion tomorrow.  Co Sign: Hillis Range, MD 10/30/2011 4:30 PM

## 2011-10-31 NOTE — Progress Notes (Signed)
  Echocardiogram Echocardiogram Transesophageal has been performed.  Jorje Guild [facility] 10/31/2011, 4:29 PM

## 2011-10-31 NOTE — Interval H&P Note (Signed)
History and Physical Interval Note:  10/31/2011 1:15 PM  Erik [name]  has presented today for surgery, with the diagnosis of a-flutter  The various methods of treatment have been discussed with the patient and family. After consideration of risks, benefits and other options for treatment, the patient has consented to  Procedure(s) (LRB): TRANSESOPHAGEAL ECHOCARDIOGRAM (TEE) (N/A) CARDIOVERSION (N/A) as a surgical intervention .  The patients' history has been reviewed, patient examined, no change in status, stable for surgery.  I have reviewed the patients' chart and labs.  Questions were answered to the patient's satisfaction.     Katielynn Horan

## 2011-11-01 ENCOUNTER — Encounter (HOSPITAL_COMMUNITY): Payer: Self-pay | Admitting: Internal Medicine

## 2011-11-17 ENCOUNTER — Ambulatory Visit (INDEPENDENT_AMBULATORY_CARE_PROVIDER_SITE_OTHER): Payer: Medicare Other | Admitting: Internal Medicine

## 2011-11-17 ENCOUNTER — Encounter: Payer: Self-pay | Admitting: Internal Medicine

## 2011-11-17 VITALS — BP 127/73 | HR 73 | Ht 71.0 in | Wt 200.0 lb

## 2011-11-17 DIAGNOSIS — I10 Essential (primary) hypertension: Secondary | ICD-10-CM

## 2011-11-17 DIAGNOSIS — I4891 Unspecified atrial fibrillation: Secondary | ICD-10-CM

## 2011-11-17 NOTE — Patient Instructions (Addendum)
Your physician wants you to follow-up in: 6 months with Dr Taylor You will receive a reminder letter in the mail two months in advance. If you don't receive a letter, please call our office to schedule the follow-up appointment.  

## 2011-11-17 NOTE — Progress Notes (Signed)
HPI Mr. Grandmaison returns today for followup. He is a very pleasant 71 year old man with a history of paroxysmal atrial fibrillation who is been fairly well controlled with flecainide. He is anticoagulated. The patient has multiple complaints. When he moves his head a certain way, his vision changes. His wife who is with him today states that he snores. He has chronic dyspnea. No Known Allergies   Current Outpatient Prescriptions  Medication Sig Dispense Refill  . acetaminophen (TYLENOL) 325 MG tablet Take 650 mg by mouth every 6 (six) hours as needed. For pain      . dabigatran (PRADAXA) 150 MG CAPS Take 1 capsule (150 mg total) by mouth every 12 (twelve) hours.  60 capsule  6  . flecainide (TAMBOCOR) 100 MG tablet Take 1 tablet (100 mg total) by mouth 2 (two) times daily.  60 tablet  6  . lisinopril (PRINIVIL,ZESTRIL) 20 MG tablet Take 10 mg by mouth 2 (two) times daily.      Marland Kitchen NITROSTAT 0.4 MG SL tablet as needed.       No current facility-administered medications for this visit.   Facility-Administered Medications Ordered in Other Visits  Medication Dose Route Frequency Provider Last Rate Last Dose  . gadobenate dimeglumine (MULTIHANCE) injection 20 mL  20 mL Intravenous Once Medication Radiologist, MD         Past Medical History  Diagnosis Date  . Atrial flutter     s/p RFCA  by GT  . Dyslipidemia   . Hypertension   . Atrial fibrillation     Paroxysmal, s/p PVI 11/11/10  . Basal cell carcinoma of ear   . GERD (gastroesophageal reflux disease)   . CAD (coronary artery disease)     a. cath 5/12: pLAD 25%, D1 25%, EF 65 % (done 2/2 abnormal myoview with inferapical  ischemia)  . TIA (transient ischemic attack)     11/12/10 post afib ablation    ROS:   All systems reviewed and negative except as noted in the HPI.   Past Surgical History  Procedure Date  . Knee arthroscopy     bilateral  . Atrial flutter ablation     by GT  . Basal cell cancer resection   . Hemorrhoid  surgery   . Eye surgery   . Tee without cardioversion 10/31/2011    Procedure: TRANSESOPHAGEAL ECHOCARDIOGRAM (TEE);  Surgeon: Dolores Patty, MD;  Location: [facility] ENDOSCOPY;  Service: Cardiovascular;  Laterality: N/A;  . Cardioversion 10/31/2011    Procedure: CARDIOVERSION;  Surgeon: Dolores Patty, MD;  Location: [facility] ENDOSCOPY;  Service: Cardiovascular;  Laterality: N/A;     Family History  Problem Relation Age of Onset  . Hypertension Mother   . Stroke Mother   . Cancer Father   . Arthritis Mother      History   Social History  . Marital Status: Married    Spouse Name: N/A    Number of Children: 1  . Years of Education: N/A   Occupational History  . 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 Smoking status: Former Smoker -- 1.5 packs/day for 20 years    Types: Cigarettes    Quit date: 05/23/1975  . Smokeless tobacco: Never Used  . Alcohol Use: 7.0 oz/week    14 drink(s) per week     2 drinks per night  . Drug Use: No  . Sexually Active: Not on file   Other Topics Concern  .  Not on file   Social History Narrative  . No narrative on file     BP 127/73  Pulse 73  Ht 5\' 11"  (1.803 m)  Wt 200 lb (90.719 kg)  BMI 27.89 kg/m2  Physical Exam:  Well appearing NAD HEENT: Unremarkable Neck:  No JVD, no thyromegally Lungs:  Clear with no wheezes, rales, or rhonchi. HEART:  Regular rate rhythm, no murmurs, no rubs, no clicks Abd:  soft, positive bowel sounds, no organomegally, no rebound, no guarding Ext:  2 plus pulses, no edema, no cyanosis, no clubbing Skin:  No rashes no nodules Neuro:  CN II through XII intact, motor grossly intact  EKG Normal sinus rhythm.  Assess/Plan:

## 2011-11-17 NOTE — Assessment & Plan Note (Signed)
His blood pressure appears to be well-controlled. I have encouraged him to maintain a low-sodium diet however he refuses.

## 2011-11-17 NOTE — Assessment & Plan Note (Signed)
He is maintaining normal sinus rhythm. He will continue flecainide. He'll continue systemic anticoagulation. I've recommended a sleep study but he refuses. I've asked the patient to stop drinking alcohol but he refuses.

## 2011-12-26 ENCOUNTER — Other Ambulatory Visit: Payer: Self-pay

## 2011-12-26 MED ORDER — DABIGATRAN ETEXILATE MESYLATE 150 MG PO CAPS: 150.0000 mg | ORAL_CAPSULE | Freq: Two times a day (BID) | ORAL | Status: DC

## 2012-02-22 ENCOUNTER — Other Ambulatory Visit: Payer: Self-pay | Admitting: *Deleted

## 2012-02-22 MED ORDER — LISINOPRIL 20 MG PO TABS: 20.0000 mg | ORAL_TABLET | Freq: Every day | ORAL | Status: DC

## 2012-02-27 ENCOUNTER — Other Ambulatory Visit: Payer: Self-pay | Admitting: Dermatology

## 2012-07-15 ENCOUNTER — Other Ambulatory Visit: Payer: Self-pay | Admitting: Cardiology

## 2012-07-15 DIAGNOSIS — I4891 Unspecified atrial fibrillation: Secondary | ICD-10-CM

## 2012-07-15 MED ORDER — FLECAINIDE ACETATE 100 MG PO TABS: 100.0000 mg | ORAL_TABLET | Freq: Two times a day (BID) | ORAL | Status: DC

## 2012-08-11 ENCOUNTER — Other Ambulatory Visit: Payer: Self-pay | Admitting: Internal Medicine

## 2012-09-26 ENCOUNTER — Telehealth: Payer: Self-pay | Admitting: Internal Medicine

## 2012-09-26 NOTE — Telephone Encounter (Signed)
New problem    Pt is having knee sx on 5/23 and need to know if he can come off his Pradaxa. Please fax info to [phone] Attn: Lynden Ang and call pt also.

## 2012-09-26 NOTE — Telephone Encounter (Signed)
Will forward to [facility] and Dr. Ladona Ridgel for ok.

## 2012-09-30 NOTE — Telephone Encounter (Signed)
New problem    Upcoming knee surgery     Please advise on praxada 150 mg .    Dr. Turner Daniels office # [phone]  / attention Olegario Messier blum [phone]- fax number

## 2012-09-30 NOTE — Telephone Encounter (Signed)
Message sent to Dr.Taylor for advice.

## 2012-10-01 ENCOUNTER — Telehealth: Payer: Self-pay | Admitting: Internal Medicine

## 2012-10-01 NOTE — Telephone Encounter (Signed)
Received request from Nurse.   To: Dr.Rowan Fax number: [phone] Attention:Kathy Obie Dredge He may Hold Pradaxa Prior to Surgery   10/01/12/KM

## 2012-10-01 NOTE — Telephone Encounter (Signed)
Faxed to Kathy  

## 2012-10-01 NOTE — Telephone Encounter (Signed)
He may stop Pradaxa the day prior to surgery and restart the day after surgery with history of TIA.

## 2012-10-02 ENCOUNTER — Telehealth: Payer: Self-pay | Admitting: Internal Medicine

## 2012-10-02 NOTE — Telephone Encounter (Signed)
Patients wife aware

## 2012-10-02 NOTE — Telephone Encounter (Signed)
Follow-up:    Patient's wife was calling in about the patient's medication instructions for his upcoming knee surgery.  Please call back.

## 2012-10-02 NOTE — Telephone Encounter (Signed)
lmom with Dr Lubertha Basque recommendations of holding the Pradaxa the day prior to surgery and restarting the day after

## 2012-10-07 ENCOUNTER — Telehealth: Payer: Self-pay

## 2012-10-07 NOTE — Telephone Encounter (Signed)
Noted the instructions from GT was faxed to [facility] by nurse Lovey Newcomer as follows:  Deliah Boston, RN at 10/01/2012 10:35 AM   Status: Signed            Faxed to Neville Route, MD at 10/01/2012 9:51 AM    Status: Signed             He may stop Pradaxa the day prior to surgery and restart the day after surgery with history of TIA.        Charna Elizabeth, LPN at 1/61/0960 3:32 PM    Status: Signed             Message sent to Dr.Taylor for advice.        Sherrilyn Rist at 09/30/2012 3:23 PM    Status: Signed             New problem  Upcoming knee surgery  Please advise on praxada 150 mg .  Dr. Turner Daniels office # [phone] / attention Olegario Messier blum [phone]- fax number        Left a message with this nurse extension to speak to Olegario Messier to advise instructions that was faxed, also noted pt wife made aware of instructions as well via KL

## 2012-10-07 NOTE — Telephone Encounter (Signed)
Olegario Messier called and states that patient is scheduled for surgery Left knee othroscopy on 10/11/2012, choice anesthesia-MAC.    Requesting patient be off of Pradaxia this week.  Please advise.  Olegario Messier call back [phone].

## 2012-10-08 NOTE — Telephone Encounter (Signed)
Spoke to Erik [name] and gave instructions, she requested we fax her the instructions to [phone], faxed office notes with instructions to office manually

## 2012-10-10 NOTE — Telephone Encounter (Signed)
lmom to return my call

## 2012-10-10 NOTE — Telephone Encounter (Signed)
Spoke with patient's wife, she understand how to hold medication

## 2012-10-10 NOTE — Telephone Encounter (Signed)
Follow-up:    Patient's wife called in because the patient is upset about his medication and wanted her to call in to double check to make sure he can be off of his medication.  Please call back.

## 2012-10-10 NOTE — Telephone Encounter (Signed)
Follow-up:    Patient's wife returned your call.  She is in a meeting please call back at about 10:00am.  Please call back.

## 2012-10-17 ENCOUNTER — Other Ambulatory Visit: Payer: Self-pay | Admitting: *Deleted

## 2012-10-17 DIAGNOSIS — I4891 Unspecified atrial fibrillation: Secondary | ICD-10-CM

## 2012-10-17 MED ORDER — LISINOPRIL 20 MG PO TABS: 20.0000 mg | ORAL_TABLET | Freq: Every day | ORAL | Status: DC

## 2012-10-17 MED ORDER — FLECAINIDE ACETATE 100 MG PO TABS: 100.0000 mg | ORAL_TABLET | Freq: Two times a day (BID) | ORAL | Status: DC

## 2012-10-17 NOTE — Telephone Encounter (Signed)
Fax Received. Refill Completed. Deandrae Wajda Chowoe (R.M.A)   

## 2012-11-12 ENCOUNTER — Encounter: Payer: Self-pay | Admitting: Physician Assistant

## 2012-11-12 ENCOUNTER — Ambulatory Visit (INDEPENDENT_AMBULATORY_CARE_PROVIDER_SITE_OTHER): Payer: Medicare Other | Admitting: Physician Assistant

## 2012-11-12 VITALS — BP 132/80 | HR 49 | Ht 71.0 in | Wt 213.0 lb

## 2012-11-12 DIAGNOSIS — R0989 Other specified symptoms and signs involving the circulatory and respiratory systems: Secondary | ICD-10-CM

## 2012-11-12 DIAGNOSIS — I10 Essential (primary) hypertension: Secondary | ICD-10-CM

## 2012-11-12 DIAGNOSIS — I4891 Unspecified atrial fibrillation: Secondary | ICD-10-CM

## 2012-11-12 DIAGNOSIS — R0683 Snoring: Secondary | ICD-10-CM

## 2012-11-12 DIAGNOSIS — I2510 Atherosclerotic heart disease of native coronary artery without angina pectoris: Secondary | ICD-10-CM

## 2012-11-12 LAB — CBC WITH DIFFERENTIAL/PLATELET
Basophils Absolute: 0 10*3/uL (ref 0.0–0.1)
Eosinophils Relative: 0.7 % (ref 0.0–5.0)
Hemoglobin: 15.8 g/dL (ref 13.0–17.0)
Lymphocytes Relative: 28.4 % (ref 12.0–46.0)
Monocytes Relative: 8.1 % (ref 3.0–12.0)
Neutro Abs: 5 10*3/uL (ref 1.4–7.7)
RBC: 4.7 Mil/uL (ref 4.22–5.81)
RDW: 12.4 % (ref 11.5–14.6)
WBC: 8 10*3/uL (ref 4.5–10.5)

## 2012-11-12 LAB — BASIC METABOLIC PANEL
BUN: 22 mg/dL (ref 6–23)
GFR: 63.25 mL/min (ref >60.00)
Potassium: 5.1 mEq/L (ref 3.5–5.1)
Sodium: 137 mEq/L (ref 135–145)

## 2012-11-12 MED ORDER — DABIGATRAN ETEXILATE MESYLATE 150 MG PO CAPS: 150.0000 mg | ORAL_CAPSULE | Freq: Two times a day (BID) | ORAL | Status: DC

## 2012-11-12 NOTE — Patient Instructions (Addendum)
LABS TODAY:  CBC & BMET  Your physician has recommended that you have a sleep study. This test records several body functions during sleep, including: brain activity, eye movement, oxygen and carbon dioxide blood levels, heart rate and rhythm, breathing rate and rhythm, the flow of air through your mouth and nose, snoring, body muscle movements, and chest and belly movement.  Your physician wants you to follow-up in: 6 months with Dr. Ladona Ridgel.  You will receive a reminder letter in the mail two months in advance. If you don't receive a letter, please call our office to schedule the follow-up appointment.

## 2012-11-12 NOTE — Progress Notes (Signed)
[address] [place], [place]  [address] Phone: [phone] Fax:  [phone]  Date:  11/12/2012   ID:  Erik [name], DOB 05/10/1941, MRN [id number]  PCP:  Benita Stabile, MD  Cardiologist:  Dr. Lewayne Bunting     History of Present Illness: Erik [name] is a 72 y.o. male who returns for f/u.    He has h/o AFib/Flutter, s/p RFCA and HTN.  Lexiscan Myoview 5/12:  infero-apical ischemia with EF 72%.  He was admitted to the hospital for ongoing chest pain. LHC 5/12:  normal LVF with EF 65% and minimal coronary plaque with pLAD 25% and D1 25%.  He has failed Rhythmol and flecainide Rx.  He ultimately underwent PVI 10/2010 with Dr. Hillis Range.  He had TIA symptoms 24 hours later that completely resolved.  He underwent TEE-DCCV for AFlutter with RVR in 10/2011.  He has been kept on flecainide.  TEE 10/2011:  EF 50-55%, mild MR, LAE, atrial septal aneurysm, trivial eff.  Last seen by Dr. Lewayne Bunting in 10/2011.    Since last seen, he is doing well.  Has an occasional "flutter."  This has been a persistent symptom for years. He has chronic DOE.  This is unchanged.  Actually, he feels it is improving.  He describes NYHA Class II-IIb symptoms.  No orthopnea, PND, edema.  No syncope.  No rapid palpitations.    Labs (6/12):  K 4.2, Cr 1.13, ALT 11, LDL 89, TSH 1.345 Labs (6/13):  K 4.4, Cr 1.30, Hgb 16  Wt Readings from Last 3 Encounters:  11/12/12 213 lb (96.616 kg)  11/17/11 200 lb (90.719 kg)  10/31/11 205 lb (92.987 kg)     Past Medical History  Diagnosis Date  . Atrial flutter     s/p RFCA  by GT  . Dyslipidemia   . Hypertension   . Atrial fibrillation     Paroxysmal, s/p PVI 11/11/10  . Basal cell carcinoma of ear   . GERD (gastroesophageal reflux disease)   . CAD (coronary artery disease)     a. cath 5/12: pLAD 25%, D1 25%, EF 65 % (done 2/2 abnormal myoview with inferapical  ischemia)  . TIA (transient ischemic attack)     11/12/10 post afib ablation     Current Outpatient Prescriptions  Medication Sig Dispense Refill  . acetaminophen (TYLENOL) 325 MG tablet Take 650 mg by mouth every 6 (six) hours as needed. For pain      . flecainide (TAMBOCOR) 100 MG tablet Take 1 tablet (100 mg total) by mouth 2 (two) times daily.  60 tablet  1  . lisinopril (PRINIVIL,ZESTRIL) 20 MG tablet Take 1 tablet (20 mg total) by mouth daily.  30 tablet  1  . NITROSTAT 0.4 MG SL tablet as needed.      Marland Kitchen PRADAXA 150 MG CAPS take 1 capsule by mouth every 12 hours  60 capsule  6   No current facility-administered medications for this visit.   Facility-Administered Medications Ordered in Other Visits  Medication Dose Route Frequency Provider Last Rate Last Dose  . gadobenate dimeglumine (MULTIHANCE) injection 20 mL  20 mL Intravenous Once Medication Radiologist, MD        Allergies:   No Known Allergies  Social History:  The patient  reports that he quit smoking about 37 years ago. His smoking use included Cigarettes. He has a 30 pack-year smoking history. He has never used smokeless tobacco. He reports that he drinks about  7.0 ounces of alcohol per week. He reports that he does not use illicit drugs.   ROS:  Please see the history of present illness.   No melena, hematochezia.  He reports a snoring hx.   All other systems reviewed and negative.   PHYSICAL EXAM: VS:  BP 132/80  Pulse 49  Ht 5\' 11"  (1.803 m)  Wt 213 lb (96.616 kg)  BMI 29.72 kg/m2 Well nourished, well developed, in no acute distress HEENT: normal Neck: no JVD at 90 degrees Cardiac:  normal S1, S2; RRR; no murmur Lungs:  clear to auscultation bilaterally, no wheezing, rhonchi or rales Abd: soft, nontender, no hepatomegaly Ext: no edema Skin: warm and dry Neuro:  CNs 2-12 intact, no focal abnormalities noted  EKG:  Sinus bradycardia, HR 49, PR 218, left axis deviation, no significant change when compared to prior tracing  ASSESSMENT AND PLAN:  1. Atrial Fibrillation/Flutter:   Maintaining NSR.  Continue Flecainide and Pradaxa.  Check BMET and CBC. 2. CAD:  No angina.  Minimal plaque on prior cardiac cath.  No ASA as he is on Pradaxa. 3. Hypertension:  Controlled.  4. Hyperlipidemia:  He is off statin Rx for unclear reasons.  5. Snoring:  With atrial arrhythmias, OSA should be ruled out.  This has been recommended by Dr. Lewayne Bunting in the past.  Patient has previously refused but is now agreeable.  Arrange sleep study.  6. Disposition:  F/u with Dr. Lewayne Bunting in 6 mos.   Signed, Tereso Newcomer, PA-C  11/12/2012 12:53 PM

## 2012-11-14 ENCOUNTER — Telehealth: Payer: Self-pay | Admitting: *Deleted

## 2012-11-14 NOTE — Telephone Encounter (Signed)
Message copied by Burnell Blanks on Thu Nov 14, 2012  8:35 AM ------      Message from: [place], [place] T      Created: Tue Nov 12, 2012  5:13 PM       Labs ok      Continue with current treatment plan.      Tereso Newcomer, PA-C        11/12/2012 5:13 PM ------

## 2012-11-14 NOTE — Telephone Encounter (Signed)
Advised patient of lab results  

## 2012-12-09 ENCOUNTER — Ambulatory Visit (HOSPITAL_BASED_OUTPATIENT_CLINIC_OR_DEPARTMENT_OTHER): Payer: Medicare Other | Attending: Physician Assistant | Admitting: Radiology

## 2012-12-09 VITALS — Ht 71.0 in | Wt 208.0 lb

## 2012-12-09 DIAGNOSIS — G4733 Obstructive sleep apnea (adult) (pediatric): Secondary | ICD-10-CM

## 2012-12-09 DIAGNOSIS — R259 Unspecified abnormal involuntary movements: Secondary | ICD-10-CM | POA: Insufficient documentation

## 2012-12-09 DIAGNOSIS — R0683 Snoring: Secondary | ICD-10-CM

## 2012-12-09 DIAGNOSIS — G4730 Sleep apnea, unspecified: Secondary | ICD-10-CM | POA: Insufficient documentation

## 2012-12-09 DIAGNOSIS — G4710 Hypersomnia, unspecified: Secondary | ICD-10-CM | POA: Insufficient documentation

## 2012-12-09 DIAGNOSIS — I4891 Unspecified atrial fibrillation: Secondary | ICD-10-CM

## 2012-12-12 DIAGNOSIS — R259 Unspecified abnormal involuntary movements: Secondary | ICD-10-CM

## 2012-12-12 DIAGNOSIS — G4710 Hypersomnia, unspecified: Secondary | ICD-10-CM

## 2012-12-12 DIAGNOSIS — G4730 Sleep apnea, unspecified: Secondary | ICD-10-CM

## 2012-12-13 NOTE — Procedures (Signed)
[name], Erik [name]              ACCOUNT NO.:  [id number]  MEDICAL RECORD NO.:  [id number]          PATIENT TYPE:  OUT  LOCATION:  SLEEP CENTER                 FACILITY[facility]  [facility]  PHYSICIAN:  Barbaraann Share, MD,FCCPDATE OF BIRTH:  09/05/1940  DATE OF STUDY:  12/09/2012                           NOCTURNAL POLYSOMNOGRAM  REFERRING PHYSICIAN:  Tereso Newcomer, PA-C  LOCATION:  Sleep Lab.  INDICATION FOR STUDY:  Hypersomnia with sleep apnea.  EPWORTH SLEEPINESS SCORE:  3.  MEDICATIONS:  SLEEP ARCHITECTURE:  The patient had a total sleep time of 289 minutes with decreased slow wave sleep and adequate quantity of REM.  Sleep onset latency was prolonged at 102 minutes and REM onset was normal at 39 minutes.  Sleep efficiency was moderately reduced at 71%.  RESPIRATORY DATA:  The patient was found to have 1 central apnea, no obstructive apneas, and no obstructive hypopneas, giving him an apnea- hypopnea index of only 0.2 events per hour.  There was moderate snoring noted throughout.  The patient did not meet split night criteria secondary to the small numbers of events.  OXYGEN DATA:  There was transient O2 desaturation as low as 93% with the patient's obstructive events.  CARDIAC DATA:  No clinically significant arrhythmias were noted.  MOVEMENTS/PARASOMNIAS:  The patient was found to have 299 leg jerks, with 5 per hour resulting in arousal or awakening.  There were no abnormal behaviors seen.  IMPRESSION/RECOMMENDATIONS: 1. Small numbers of events, which do not meet the AHI criteria for     sleep-disordered breathing.  The patient did have moderate snoring     and should be encouraged to work on weight loss as well as     positional therapy. 2. Very large numbers of leg jerks, with 5 per hour resulting in     arousal or awakening.  This is very suspicious for     a primary movement disorder of sleep such as the periodic limb     movement disorder or the restless legs syndrome.   Clinical     correlation is suggested.     Barbaraann Share, MD,FCCP Diplomate, American Board of Sleep Medicine    [facility]  D:  12/12/2012 14:53:00  T:  12/13/2012 03:02:01  Job:  [id number]

## 2012-12-20 ENCOUNTER — Encounter: Payer: Self-pay | Admitting: Physician Assistant

## 2012-12-20 ENCOUNTER — Telehealth: Payer: Self-pay | Admitting: Physician Assistant

## 2012-12-20 NOTE — Telephone Encounter (Signed)
Sleep study neg for significant OSA. He did have a lot of leg jerking during sleep. May have restless leg syndrome. Recommend he f/u with PCP for evaluation and management of possible restless leg syndrome.   Tereso Newcomer, PA-C   12/20/2012 4:35 PM

## 2012-12-23 ENCOUNTER — Other Ambulatory Visit: Payer: Self-pay

## 2012-12-23 DIAGNOSIS — I4891 Unspecified atrial fibrillation: Secondary | ICD-10-CM

## 2012-12-23 MED ORDER — FLECAINIDE ACETATE 100 MG PO TABS: 100.0000 mg | ORAL_TABLET | Freq: Two times a day (BID) | ORAL | Status: DC

## 2012-12-24 ENCOUNTER — Other Ambulatory Visit: Payer: Self-pay | Admitting: *Deleted

## 2012-12-24 MED ORDER — LISINOPRIL 20 MG PO TABS: 20.0000 mg | ORAL_TABLET | Freq: Every day | ORAL | Status: DC

## 2012-12-25 NOTE — Telephone Encounter (Signed)
Received call back from patient Sleep study results given.Patient advised to follow up with PCP about possible restless leg syndrome.

## 2012-12-25 NOTE — Telephone Encounter (Signed)
Patient called no answer.LMTC. 

## 2012-12-25 NOTE — Telephone Encounter (Signed)
Hi Ladies,  I am out of the office all week over here at [facility]. Would someone see if they can call this pt with results and recommendations. I thank you all for all the help you all have given while I have been out of the office.

## 2013-04-23 ENCOUNTER — Telehealth: Payer: Self-pay | Admitting: *Deleted

## 2013-04-23 ENCOUNTER — Telehealth: Payer: Self-pay | Admitting: Internal Medicine

## 2013-04-23 NOTE — Telephone Encounter (Signed)
Patient called for pradaxa samples. Samples provided to patient.

## 2013-04-23 NOTE — Telephone Encounter (Signed)
ERROR

## 2013-06-04 ENCOUNTER — Emergency Department (HOSPITAL_COMMUNITY): Payer: Medicare Other

## 2013-06-04 ENCOUNTER — Encounter (HOSPITAL_COMMUNITY): Payer: Self-pay | Admitting: Emergency Medicine

## 2013-06-04 ENCOUNTER — Emergency Department (HOSPITAL_COMMUNITY)
Admission: EM | Admit: 2013-06-04 | Discharge: 2013-06-04 | Disposition: A | Discharge status: Home / Self Care | Payer: Medicare Other | Attending: Emergency Medicine | Admitting: Emergency Medicine

## 2013-06-04 DIAGNOSIS — W1809XA Striking against other object with subsequent fall, initial encounter: Secondary | ICD-10-CM | POA: Insufficient documentation

## 2013-06-04 DIAGNOSIS — Z87891 Personal history of nicotine dependence: Secondary | ICD-10-CM | POA: Insufficient documentation

## 2013-06-04 DIAGNOSIS — W19XXXA Unspecified fall, initial encounter: Secondary | ICD-10-CM

## 2013-06-04 DIAGNOSIS — Z862 Personal history of diseases of the blood and blood-forming organs and certain disorders involving the immune mechanism: Secondary | ICD-10-CM | POA: Insufficient documentation

## 2013-06-04 DIAGNOSIS — I4891 Unspecified atrial fibrillation: Secondary | ICD-10-CM | POA: Insufficient documentation

## 2013-06-04 DIAGNOSIS — I10 Essential (primary) hypertension: Secondary | ICD-10-CM | POA: Insufficient documentation

## 2013-06-04 DIAGNOSIS — Z7901 Long term (current) use of anticoagulants: Secondary | ICD-10-CM | POA: Insufficient documentation

## 2013-06-04 DIAGNOSIS — Y939 Activity, unspecified: Secondary | ICD-10-CM | POA: Insufficient documentation

## 2013-06-04 DIAGNOSIS — Z8639 Personal history of other endocrine, nutritional and metabolic disease: Secondary | ICD-10-CM | POA: Insufficient documentation

## 2013-06-04 DIAGNOSIS — Y929 Unspecified place or not applicable: Secondary | ICD-10-CM | POA: Insufficient documentation

## 2013-06-04 DIAGNOSIS — I2510 Atherosclerotic heart disease of native coronary artery without angina pectoris: Secondary | ICD-10-CM | POA: Insufficient documentation

## 2013-06-04 DIAGNOSIS — Z8673 Personal history of transient ischemic attack (TIA), and cerebral infarction without residual deficits: Secondary | ICD-10-CM | POA: Insufficient documentation

## 2013-06-04 DIAGNOSIS — S2239XA Fracture of one rib, unspecified side, initial encounter for closed fracture: Secondary | ICD-10-CM

## 2013-06-04 DIAGNOSIS — Z85828 Personal history of other malignant neoplasm of skin: Secondary | ICD-10-CM | POA: Insufficient documentation

## 2013-06-04 DIAGNOSIS — Z8719 Personal history of other diseases of the digestive system: Secondary | ICD-10-CM | POA: Insufficient documentation

## 2013-06-04 DIAGNOSIS — S2249XA Multiple fractures of ribs, unspecified side, initial encounter for closed fracture: Secondary | ICD-10-CM | POA: Insufficient documentation

## 2013-06-04 DIAGNOSIS — Z79899 Other long term (current) drug therapy: Secondary | ICD-10-CM | POA: Insufficient documentation

## 2013-06-04 LAB — CBC
HEMATOCRIT: 45.2 % (ref 39.0–52.0)
Hemoglobin: 15.5 g/dL (ref 13.0–17.0)
MCH: 33.3 pg (ref 26.0–34.0)
MCHC: 34.3 g/dL (ref 30.0–36.0)
MCV: 97 fL (ref 78.0–100.0)
PLATELETS: 249 10*3/uL (ref 150–400)
RBC: 4.66 MIL/uL (ref 4.22–5.81)
RDW: 12.4 % (ref 11.5–15.5)
WBC: 13.8 10*3/uL — AB (ref 4.0–10.5)

## 2013-06-04 LAB — POCT I-STAT, CHEM 8
BUN: 19 mg/dL (ref 6–23)
CALCIUM ION: 1.21 mmol/L (ref 1.13–1.30)
Chloride: 102 mEq/L (ref 96–112)
Creatinine, Ser: 1 mg/dL (ref 0.50–1.35)
Glucose, Bld: 94 mg/dL (ref 70–99)
HEMATOCRIT: 49 % (ref 39.0–52.0)
HEMOGLOBIN: 16.7 g/dL (ref 13.0–17.0)
POTASSIUM: 5.1 meq/L (ref 3.7–5.3)
Sodium: 139 mEq/L (ref 137–147)
TCO2: 28 mmol/L (ref 0–100)

## 2013-06-04 LAB — SAMPLE TO BLOOD BANK

## 2013-06-04 LAB — PROTIME-INR
INR: 1.05 (ref 0.00–1.49)
Prothrombin Time: 13.5 seconds (ref 11.6–15.2)

## 2013-06-04 LAB — CG4 I-STAT (LACTIC ACID): Lactic Acid, Venous: 1.23 mmol/L (ref 0.5–2.2)

## 2013-06-04 MED ORDER — HYDROCODONE-ACETAMINOPHEN 5-325 MG PO TABS: 1.0000 | ORAL_TABLET | ORAL | Status: DC | PRN

## 2013-06-04 MED ORDER — HYDROCODONE-ACETAMINOPHEN 5-325 MG PO TABS
1.0000 | ORAL_TABLET | Freq: Once | ORAL | Status: DC
  Filled 2013-06-04: qty 1

## 2013-06-04 MED ORDER — MORPHINE SULFATE 4 MG/ML IJ SOLN
4.0000 mg | Freq: Once | INTRAMUSCULAR | Status: AC
  Administered 2013-06-04: 4 mg via INTRAVENOUS
  Filled 2013-06-04: qty 1

## 2013-06-04 MED ORDER — IOHEXOL 300 MG/ML  SOLN
100.0000 mL | Freq: Once | INTRAMUSCULAR | Status: AC | PRN
Start: 2013-06-04 — End: 2013-06-04
  Administered 2013-06-04: 100 mL via INTRAVENOUS

## 2013-06-04 MED ORDER — SODIUM CHLORIDE 0.9 % IV BOLUS (SEPSIS)
1000.0000 mL | Freq: Once | INTRAVENOUS | Status: AC
  Administered 2013-06-04: 1000 mL via INTRAVENOUS

## 2013-06-04 MED ORDER — HYDROMORPHONE HCL PF 1 MG/ML IJ SOLN
1.0000 mg | INTRAMUSCULAR | Status: DC | PRN
  Filled 2013-06-04: qty 1

## 2013-06-04 MED ORDER — HYDROCODONE-ACETAMINOPHEN 5-325 MG PO TABS
2.0000 | ORAL_TABLET | Freq: Once | ORAL | Status: AC
  Administered 2013-06-04: 2 via ORAL
  Filled 2013-06-04: qty 1

## 2013-06-04 MED ORDER — ONDANSETRON HCL 4 MG/2ML IJ SOLN
4.0000 mg | Freq: Once | INTRAMUSCULAR | Status: AC
  Administered 2013-06-04: 4 mg via INTRAVENOUS
  Filled 2013-06-04: qty 2

## 2013-06-04 NOTE — ED Notes (Signed)
Pt slipped and fell on ice this am. Hit back of head and left side. Pt have excruciating pain to left rib cage. Pain with deep breath.

## 2013-06-04 NOTE — ED Notes (Signed)
Remains in triage waiting area.  Updated on wait for treatment room. 

## 2013-06-04 NOTE — ED Notes (Signed)
Pt given IS, and demonstrated use. At pt bedside.

## 2013-06-04 NOTE — ED Provider Notes (Signed)
CSN: [id number]     Arrival date & time 06/04/13  1237 History   First MD Initiated Contact with Patient 06/04/13 1540     Chief Complaint  Patient presents with  . Fall  . Rib Injury   (Consider location/radiation/quality/duration/timing/severity/associated sxs/prior Treatment) Patient is a 73 y.o. male presenting with fall and trauma.  Fall Associated symptoms include chest pain (posterior chest wall pain). Pertinent negatives include no abdominal pain, chills, coughing, fever, headaches, nausea, neck pain, rash, sore throat or vomiting.  Trauma Mechanism of injury: fall Injury location: head/neck and torso Injury location detail: L chest and back   Fall:      Fall occurred: down stairs      Height of fall: 3      Impact surface: concrete      Suspicion of alcohol use: no      Suspicion of drug use: no  EMS/PTA data:      Ambulatory at scene: yes      Blood loss: none      Responsiveness: alert      Loss of consciousness: no  Current symptoms:      Associated symptoms:            Reports chest pain (posterior chest wall pain).            Denies abdominal pain, back pain, headache, loss of consciousness, nausea, neck pain, seizures and vomiting.    Past Medical History  Diagnosis Date  . Atrial flutter     s/p RFCA  by GT  . Dyslipidemia   . Hypertension   . Atrial fibrillation     Paroxysmal, s/p PVI 11/11/10  . Basal cell carcinoma of ear   . GERD (gastroesophageal reflux disease)   . CAD (coronary artery disease)     a. cath 5/12: pLAD 25%, D1 25%, EF 65 % (done 2/2 abnormal myoview with inferapical  ischemia)  . TIA (transient ischemic attack)     11/12/10 post afib ablation  . Snoring     a. sleep study 11/2012 neg for OSA; increased leg movement suspicious for primary movement d/o (ie restless leg syndrome)    Past Surgical History  Procedure Laterality Date  . Knee arthroscopy      bilateral  . Atrial flutter ablation      by GT  . Basal cell cancer  resection    . Hemorrhoid surgery    . Eye surgery    . Tee without cardioversion  10/31/2011    Procedure: TRANSESOPHAGEAL ECHOCARDIOGRAM (TEE);  Surgeon: Jolaine Artist, MD;  Location: [facility] ENDOSCOPY;  Service: Cardiovascular;  Laterality: N/A;  . Cardioversion  10/31/2011    Procedure: CARDIOVERSION;  Surgeon: Jolaine Artist, MD;  Location: [facility] ENDOSCOPY;  Service: Cardiovascular;  Laterality: N/A;   Family History  Problem Relation Age of Onset  . Hypertension Mother   . Stroke Mother   . Cancer Father   . Arthritis Mother    History  Substance Use Topics  . Smoking status: Former Smoker -- 1.50 packs/day for 20 years    Types: Cigarettes    Quit date: 05/23/1975  . Smokeless tobacco: Never Used  . Alcohol Use: 7.0 oz/week    14 drink(s) per week     Comment: 2 drinks per night    Review of Systems  Constitutional: Negative for fever and chills.  HENT: Negative for sore throat.   Eyes: Negative for pain.  Respiratory: Negative for cough and shortness of breath.  Cardiovascular: Positive for chest pain (posterior chest wall pain).  Gastrointestinal: Negative for nausea, vomiting and abdominal pain.  Genitourinary: Negative for dysuria and flank pain.  Musculoskeletal: Negative for back pain and neck pain.  Skin: Negative for rash.  Neurological: Negative for seizures, loss of consciousness and headaches.    Allergies  Review of patient's allergies indicates no known allergies.  Home Medications   Current Outpatient Rx  Name  Route  Sig  Dispense  Refill  . acetaminophen (TYLENOL) 325 MG tablet   Oral   Take 650 mg by mouth every 6 (six) hours as needed. For pain         . Chlorpheniramine-DM (CORICIDIN COUGH/COLD) 4-30 MG TABS   Oral   Take 1 tablet by mouth daily as needed (for cold).         . dabigatran (PRADAXA) 150 MG CAPS   Oral   Take 1 capsule (150 mg total) by mouth every 12 (twelve) hours.   180 capsule   3   . flecainide (TAMBOCOR) 100  MG tablet   Oral   Take 1 tablet (100 mg total) by mouth 2 (two) times daily.   60 tablet   5   . lisinopril (PRINIVIL,ZESTRIL) 20 MG tablet   Oral   Take 1 tablet (20 mg total) by mouth daily.   30 tablet   6     NEED APPOINTMENT [id number]   . HYDROcodone-acetaminophen (NORCO/VICODIN) 5-325 MG per tablet   Oral   Take 1-2 tablets by mouth every 4 (four) hours as needed for moderate pain or severe pain.   90 tablet   0   . NITROSTAT 0.4 MG SL tablet   Sublingual   Place 0.4 mg under the tongue as needed for chest pain.           BP 141/67  Pulse 56  Temp(Src) 98 F (36.7 C) (Oral)  Resp 16  SpO2 95% Physical Exam  Constitutional: He is oriented to person, place, and time. He appears well-developed and well-nourished. No distress.  HENT:  Head: Normocephalic and atraumatic.    Eyes: Pupils are equal, round, and reactive to light.  Neck: Normal range of motion.  Cardiovascular: Normal rate and regular rhythm.   Pulmonary/Chest: Effort normal and breath sounds normal. No respiratory distress. He exhibits bony tenderness (R posterior chest).  Abdominal: Soft. He exhibits no distension. There is no tenderness.  Musculoskeletal: Normal range of motion.       Cervical back: He exhibits no bony tenderness and no deformity.       Thoracic back: He exhibits no bony tenderness and no deformity.       Lumbar back: He exhibits no bony tenderness and no deformity.  Neurological: He is alert and oriented to person, place, and time.  Skin: Skin is warm. He is not diaphoretic.    ED Course  Procedures (including critical care time) Labs Review Labs Reviewed  CBC - Abnormal; Notable for the following:    WBC 13.8 (*)    All other components within normal limits  PROTIME-INR  POCT I-STAT, CHEM 8  CG4 I-STAT (LACTIC ACID)  SAMPLE TO BLOOD BANK   Imaging Review Dg Ribs Unilateral W/chest Left  06/04/2013   CLINICAL DATA:  Fall.  Rib pain.  EXAM: LEFT RIBS AND CHEST -  3+ VIEW  COMPARISON:  10/30/2011  FINDINGS: Tears of the left lateral sixth and seventh ribs, mildly displaced. No underlying lung contusion. No pneumothorax or pleural effusion  is evident. There is mild left lung base reticular opacity that is likely atelectasis.  IMPRESSION: Mildly displaced fractures of the left lateral sixth and seventh ribs. No pneumothorax, pleural effusion or lung contusion.   Electronically Signed   By: Lajean Manes M.D.   On: 06/04/2013 13:51   Ct Head Wo Contrast  06/04/2013   CLINICAL DATA:  Patient slipped and fell on ice this morning hitting the back of this had an the left side.  EXAM: CT HEAD WITHOUT CONTRAST  CT CERVICAL SPINE WITHOUT CONTRAST  TECHNIQUE: Multidetector CT imaging of the head and cervical spine was performed following the standard protocol without intravenous contrast. Multiplanar CT image reconstructions of the cervical spine were also generated.  COMPARISON:  11/12/2010  FINDINGS: CT HEAD FINDINGS  The ventricles are normal in size and configuration.  There are no parenchymal masses or mass effect. No evidence of a cortical infarct. Minimal white matter hypoattenuation is noted consistent with chronic microvascular ischemic change.  No extra-axial masses or abnormal fluid collections.  There is no intracranial hemorrhage.  No skull fracture. Visualized sinuses and mastoid air cells are clear.  CT CERVICAL SPINE FINDINGS  No fracture or spondylolisthesis. Moderate loss of disc height at C5-C6 and mild to moderate loss of disc height at C6-C7. Remaining cervical discs are well preserved. Mild facet degenerative changes are noted. There is no significant central stenosis or neural foraminal narrowing.  Surrounding soft tissues are unremarkable.  There is a small pneumothorax at the left apex. No rib fracture is seen on the included field of view.  IMPRESSION: HEAD CT:  No acute intracranial abnormality.  No skull fracture.  CERVICAL CT:  No cervical fracture or  spondylolisthesis.  Small left apical pneumothorax.   Electronically Signed   By: Lajean Manes M.D.   On: 06/04/2013 16:56   Ct Chest W Contrast  06/04/2013   CLINICAL DATA:  73 year old who fell on ice earlier today, striking the back of the head and the left side. Severe left chest pain. Rib fractures on earlier chest imaging.  EXAM: CT CHEST, ABDOMEN, AND PELVIS WITH CONTRAST  TECHNIQUE: Multidetector CT imaging of the chest, abdomen and pelvis was performed following the standard protocol during bolus administration of intravenous contrast.  CONTRAST:  133mL OMNIPAQUE IOHEXOL 300 MG/ML  SOLN IV.  COMPARISON:  No prior CT.  FINDINGS: CT CHEST FINDINGS  Bone window images demonstrate mildly displaced fractures involving the left posterolateral 6th and 7th ribs. No other fractures identified involving the bony thorax. Mild diffuse thoracic spondylosis.  No evidence of left pneumothorax. Small left pleural effusion/hemothorax. Atelectasis in the left lower lobe and lingula. Dependent atelectasis in the right lower lobe, as expected. Mild dependent atelectasis in the posterior right middle lobe. No evidence of pulmonary contusion.  No evidence of mediastinal hematoma. Heart mildly enlarged with left ventricular enlargement. No visible coronary atherosclerosis. No visible atherosclerosis involving the thoracic aorta.  No significant mediastinal, hilar, or axillary lymphadenopathy. Visualized thyroid gland unremarkable.  CT ABDOMEN AND PELVIS FINDINGS  Bone window images demonstrate no fractures involving the lumbar spine or the bony pelvis. Moderate to severe multifactorial spinal stenosis at L4-5. Ankylosis of the left sacroiliac joint. Mild degenerative changes in both hips.  No evidence of acute traumatic injury to the abdominal or pelvic visceral. Mild diffuse hepatic steatosis and subcentimeter cyst in the posterior segment right lobe of liver; no significant hepatic parenchymal abnormality. Normal appearing  spleen, pancreas, adrenal glands, and gallbladder. No biliary ductal  dilation. Mild cortical thinning involving both kidneys, subcentimeter cyst in the anterior mid right kidney, but no significant abnormalities otherwise involving either kidney. Mild distal abdominal aortic atherosclerosis without aneurysm. No significant lymphadenopathy.  Stomach decompressed and unremarkable. Normal appearing small bowel. Sigmoid colon diverticulosis without evidence of acute diverticulitis. Moderate to large stool burden in the ascending colon and cecum. Normal appendix in the right upper pelvis. No free intraperitoneal fluid or blood.  Urinary bladder decompressed and unremarkable. Moderate to marked prostate gland enlargement, the gland measuring approximately 5.6 x 5.8 x 5.4 cm. Normal seminal vesicles. Phleboliths low in the left side of the pelvis.  IMPRESSION: CHEST:  1. Fractures involving the left posterolateral 6th and 7th ribs. 2. No pneumothorax. 3. Very small left pleural effusion/hemothorax. 4. No evidence of lung contusion. Mild left lower lobe and lingular atelectasis. Expected dependent atelectasis in the right lower lobe and right middle lobe. 5. No evidence of mediastinal hematoma. 6. Mild cardiomegaly with left ventricular enlargement.  ABDOMEN/PELVIS:  1. No acute traumatic injury to the abdominal or pelvic visceral. 2. No fractures involving the lumbar spine or bony pelvis. 3. Mild diffuse hepatic steatosis. 4. Mild cortical thinning involving both kidneys. 5. Sigmoid colon diverticulosis without evidence of acute diverticulitis. 6. Moderate to marked prostate gland enlargement. 7. Moderate to severe multifactorial spinal stenosis at L4-5.   Electronically Signed   By: Evangeline Dakin M.D.   On: 06/04/2013 19:09   Ct Cervical Spine Wo Contrast  06/04/2013   CLINICAL DATA:  Patient slipped and fell on ice this morning hitting the back of this had an the left side.  EXAM: CT HEAD WITHOUT CONTRAST  CT  CERVICAL SPINE WITHOUT CONTRAST  TECHNIQUE: Multidetector CT imaging of the head and cervical spine was performed following the standard protocol without intravenous contrast. Multiplanar CT image reconstructions of the cervical spine were also generated.  COMPARISON:  11/12/2010  FINDINGS: CT HEAD FINDINGS  The ventricles are normal in size and configuration.  There are no parenchymal masses or mass effect. No evidence of a cortical infarct. Minimal white matter hypoattenuation is noted consistent with chronic microvascular ischemic change.  No extra-axial masses or abnormal fluid collections.  There is no intracranial hemorrhage.  No skull fracture. Visualized sinuses and mastoid air cells are clear.  CT CERVICAL SPINE FINDINGS  No fracture or spondylolisthesis. Moderate loss of disc height at C5-C6 and mild to moderate loss of disc height at C6-C7. Remaining cervical discs are well preserved. Mild facet degenerative changes are noted. There is no significant central stenosis or neural foraminal narrowing.  Surrounding soft tissues are unremarkable.  There is a small pneumothorax at the left apex. No rib fracture is seen on the included field of view.  IMPRESSION: HEAD CT:  No acute intracranial abnormality.  No skull fracture.  CERVICAL CT:  No cervical fracture or spondylolisthesis.  Small left apical pneumothorax.   Electronically Signed   By: Lajean Manes M.D.   On: 06/04/2013 16:56   Ct Abdomen Pelvis W Contrast  06/04/2013   CLINICAL DATA:  73 year old who fell on ice earlier today, striking the back of the head and the left side. Severe left chest pain. Rib fractures on earlier chest imaging.  EXAM: CT CHEST, ABDOMEN, AND PELVIS WITH CONTRAST  TECHNIQUE: Multidetector CT imaging of the chest, abdomen and pelvis was performed following the standard protocol during bolus administration of intravenous contrast.  CONTRAST:  133mL OMNIPAQUE IOHEXOL 300 MG/ML  SOLN IV.  COMPARISON:  No  prior CT.  FINDINGS: CT  CHEST FINDINGS  Bone window images demonstrate mildly displaced fractures involving the left posterolateral 6th and 7th ribs. No other fractures identified involving the bony thorax. Mild diffuse thoracic spondylosis.  No evidence of left pneumothorax. Small left pleural effusion/hemothorax. Atelectasis in the left lower lobe and lingula. Dependent atelectasis in the right lower lobe, as expected. Mild dependent atelectasis in the posterior right middle lobe. No evidence of pulmonary contusion.  No evidence of mediastinal hematoma. Heart mildly enlarged with left ventricular enlargement. No visible coronary atherosclerosis. No visible atherosclerosis involving the thoracic aorta.  No significant mediastinal, hilar, or axillary lymphadenopathy. Visualized thyroid gland unremarkable.  CT ABDOMEN AND PELVIS FINDINGS  Bone window images demonstrate no fractures involving the lumbar spine or the bony pelvis. Moderate to severe multifactorial spinal stenosis at L4-5. Ankylosis of the left sacroiliac joint. Mild degenerative changes in both hips.  No evidence of acute traumatic injury to the abdominal or pelvic visceral. Mild diffuse hepatic steatosis and subcentimeter cyst in the posterior segment right lobe of liver; no significant hepatic parenchymal abnormality. Normal appearing spleen, pancreas, adrenal glands, and gallbladder. No biliary ductal dilation. Mild cortical thinning involving both kidneys, subcentimeter cyst in the anterior mid right kidney, but no significant abnormalities otherwise involving either kidney. Mild distal abdominal aortic atherosclerosis without aneurysm. No significant lymphadenopathy.  Stomach decompressed and unremarkable. Normal appearing small bowel. Sigmoid colon diverticulosis without evidence of acute diverticulitis. Moderate to large stool burden in the ascending colon and cecum. Normal appendix in the right upper pelvis. No free intraperitoneal fluid or blood.  Urinary bladder  decompressed and unremarkable. Moderate to marked prostate gland enlargement, the gland measuring approximately 5.6 x 5.8 x 5.4 cm. Normal seminal vesicles. Phleboliths low in the left side of the pelvis.  IMPRESSION: CHEST:  1. Fractures involving the left posterolateral 6th and 7th ribs. 2. No pneumothorax. 3. Very small left pleural effusion/hemothorax. 4. No evidence of lung contusion. Mild left lower lobe and lingular atelectasis. Expected dependent atelectasis in the right lower lobe and right middle lobe. 5. No evidence of mediastinal hematoma. 6. Mild cardiomegaly with left ventricular enlargement.  ABDOMEN/PELVIS:  1. No acute traumatic injury to the abdominal or pelvic visceral. 2. No fractures involving the lumbar spine or bony pelvis. 3. Mild diffuse hepatic steatosis. 4. Mild cortical thinning involving both kidneys. 5. Sigmoid colon diverticulosis without evidence of acute diverticulitis. 6. Moderate to marked prostate gland enlargement. 7. Moderate to severe multifactorial spinal stenosis at L4-5.   Electronically Signed   By: Evangeline Dakin M.D.   On: 06/04/2013 19:09    EKG Interpretation   None       MDM   1. Fall   2. Rib fractures    73 yo M presents s/p fall down stairs, landing on his L back, head.   Patient on pradaxa. Will obtain CT head/neck given meds, age, trauma.   Scans demonstrate rib fractures. Initially, there was small pneumothorax on CT cervical spine films. Because of this and the rib fractures, concern for additional injuries. CT abd/pelvis/chest performed, with no other acute pathologies. On CT chest, pneumothorax already gone. The patient's only traumatic injuries were 2 consecutive rib fractures. The pneumothorax resolves in our between CT scans. I spoke with the trauma surgeon on call, and asked if a small pneumothorax now resolved what necessitated admission. I agree with his assessment, which is that the pneumothorax has resolved, and the patient has good  incentive spirometry numbers inadequate pain  control, he may be discharged to home with close followup and constrict return precautions. I am in agreement with this plan. I also spoke with the radiologist confirmed that there was no evidence of further pneumothorax on the CT chest.  Upon reassessment of the patient, he feels this pain is better controlled. He is able to pull approximately 1500 cc on his incentive spirometer. He is able to cough, though with some pain when he coughs. I discussed at length with the patient the importance of taking adequate pain medication to be able to cough and to pull over 1500 cc on maintenance spirometer. The patient and the wife acknowledged this as a goal. I discussed with the patient strict return precautions, including fever, nausea, vomiting, increasing chest pain, hemoptysis. Patient agrees to return to the emergency room if he has increasing concerning symptoms. The wife also in agreement with this plan. Patient is discharged to home in stable condition with prescription for narcotic pain medications. Strict return precautions given. Patient was seen and evaluated by my attending physician, Dr. Stark Jock  Patient seen and evaluated by myself and my attending, Dr. Stark Jock.     Freddi Che, MD 06/05/13 [phone]

## 2013-06-06 NOTE — ED Provider Notes (Signed)
I saw and evaluated the patient, reviewed the resident's note and I agree with the findings and plan.  Patient is a 73 year old male who presents after a fall from slipping on ice.  He complains of severe pain in the left chest.  He also struck the back of his head and takes pradaxa.  He denies headache.    On exam, vitals are stable and he is afebrile.  Heart is rrr and lungs are clear and equal.  Abdomen is benign and neuro exam is non-focal.  Workup reveals rib fractures but no ptx.  He was also sent for ct of the head and neck given his anticoagulant use.  These were negative with the exception of what appeared to be a small apical ptx. As he continues with severe pain, additional studies were obtained, including ct of the chest, abd, and pelvis.  These showed no additional injuries and the ptx seen on the c-spine ct was not visible on the chest ct.  This was discussed with trauma who feels as though the patient is appropriate for discharge.      Veryl Speak, MD 06/06/13 [phone]

## 2013-07-21 ENCOUNTER — Other Ambulatory Visit: Payer: Self-pay

## 2013-07-21 DIAGNOSIS — I4891 Unspecified atrial fibrillation: Secondary | ICD-10-CM

## 2013-07-21 MED ORDER — FLECAINIDE ACETATE 100 MG PO TABS: 100.0000 mg | ORAL_TABLET | Freq: Two times a day (BID) | ORAL | Status: DC

## 2013-09-03 ENCOUNTER — Other Ambulatory Visit: Payer: Self-pay

## 2013-09-03 MED ORDER — LISINOPRIL 20 MG PO TABS: 20.0000 mg | ORAL_TABLET | Freq: Every day | ORAL | Status: DC

## 2013-11-25 ENCOUNTER — Other Ambulatory Visit: Payer: Self-pay | Admitting: *Deleted

## 2013-11-25 MED ORDER — DABIGATRAN ETEXILATE MESYLATE 150 MG PO CAPS: 150.0000 mg | ORAL_CAPSULE | Freq: Two times a day (BID) | ORAL | Status: DC

## 2013-12-15 ENCOUNTER — Other Ambulatory Visit: Payer: Self-pay | Admitting: Internal Medicine

## 2014-01-01 ENCOUNTER — Telehealth: Payer: Self-pay | Admitting: Physician Assistant

## 2014-01-01 ENCOUNTER — Other Ambulatory Visit: Payer: Self-pay

## 2014-01-01 ENCOUNTER — Other Ambulatory Visit: Payer: Self-pay | Admitting: Physician Assistant

## 2014-01-01 MED ORDER — DABIGATRAN ETEXILATE MESYLATE 150 MG PO CAPS: 150.0000 mg | ORAL_CAPSULE | Freq: Two times a day (BID) | ORAL | Status: DC

## 2014-01-01 NOTE — Telephone Encounter (Signed)
Talked to the [employer], given patient 60 tabs of pradaxa which would last him 30 days for a-fib. Patient will need follow up with Dr. Lovena Le if need more refill.  Hilbert Corrigan PA Pager: [phone]

## 2014-01-02 ENCOUNTER — Telehealth: Payer: Self-pay | Admitting: *Deleted

## 2014-01-02 NOTE — Telephone Encounter (Signed)
pt notified about needing f/u per Brynda Rim. PA, Pt asked why, I explained this would be a yrly f/u as well as further refills since not seen since last year. Pt said ok to 9/2 3:40 SW/GT.Marland KitchenMarland Kitchen

## 2014-01-02 NOTE — Telephone Encounter (Signed)
Please arrange FU with Dr. Cristopher Peru or me. Richardson Dopp, PA-C   01/02/2014 1:13 AM

## 2014-01-18 ENCOUNTER — Encounter: Payer: Self-pay | Admitting: *Deleted

## 2014-01-21 ENCOUNTER — Ambulatory Visit (INDEPENDENT_AMBULATORY_CARE_PROVIDER_SITE_OTHER): Payer: Medicare Other | Admitting: Physician Assistant

## 2014-01-21 ENCOUNTER — Encounter: Payer: Self-pay | Admitting: Physician Assistant

## 2014-01-21 VITALS — BP 137/67 | HR 56 | Ht 71.0 in | Wt 209.0 lb

## 2014-01-21 DIAGNOSIS — I4891 Unspecified atrial fibrillation: Secondary | ICD-10-CM

## 2014-01-21 DIAGNOSIS — R0602 Shortness of breath: Secondary | ICD-10-CM

## 2014-01-21 DIAGNOSIS — I482 Chronic atrial fibrillation, unspecified: Secondary | ICD-10-CM

## 2014-01-21 DIAGNOSIS — K921 Melena: Secondary | ICD-10-CM

## 2014-01-21 DIAGNOSIS — I10 Essential (primary) hypertension: Secondary | ICD-10-CM

## 2014-01-21 DIAGNOSIS — I2510 Atherosclerotic heart disease of native coronary artery without angina pectoris: Secondary | ICD-10-CM

## 2014-01-21 NOTE — Progress Notes (Signed)
Cardiology Office Note    Date:  01/21/2014   ID:  Erik [name], DOB [birth year], MRN [id number]  PCP:  Donnie Coffin, MD  Cardiologist:  Dr. Cristopher Peru     History of Present Illness: Erik [name] is a 73 y.o. male with a hx of AFib/Flutter, s/p RFCA and HTN.    He has failed Rhythmol and flecainide Rx. He ultimately underwent PVI 10/2010 with Dr. Thompson Grayer. He had TIA symptoms 24 hours later that completely resolved. He underwent TEE-DCCV for AFlutter with RVR in 10/2011. He has been kept on flecainide.  Last seen here 10/2012.  He returns for FU.    He has a long hx of dyspnea ("5-10 years") that is largely unchanged.  He is NYHA 2b-3.  He had a CPX test in 2012.  This suggested chronotropic incompetence.  However, when seen back by Dr. Cristopher Peru his symptoms were improved.   His dyspnea seems to be worse at some times than others.  He fell in 05/2013 and broke several ribs.  He continues to have L chest pain related to this.  He denies orthopnea, PND, edema.  He denies syncope.     Studies:  - LHC 5/12: normal LVF with EF 65% and minimal coronary plaque with pLAD 25% and D1 25%.  - TEE 10/2011: EF 50-55%, mild MR, LAE, atrial septal aneurysm, trivial eff.  Carlton Adam Myoview 5/12: infero-apical ischemia with EF 72%.   Recent Labs/Images: 06/04/2013: Creatinine 1.00; Hemoglobin 16.7; Potassium 5.1   Wt Readings from Last 3 Encounters:  01/21/14 209 lb (94.802 kg)  12/09/12 208 lb (94.348 kg)  11/12/12 213 lb (96.616 kg)     Past Medical History  Diagnosis Date  . Atrial flutter     s/p RFCA  by GT  . Dyslipidemia   . Hypertension   . Atrial fibrillation     Paroxysmal, s/p PVI 11/11/10  . Basal cell carcinoma of ear   . GERD (gastroesophageal reflux disease)   . CAD (coronary artery disease)     a. cath 5/12: pLAD 25%, D1 25%, EF 65 % (done 2/2 abnormal myoview with inferapical  ischemia)  . TIA (transient ischemic attack)     11/12/10 post afib ablation  .  Snoring     a. sleep study 11/2012 neg for OSA; increased leg movement suspicious for primary movement d/o (ie restless leg syndrome)     Current Outpatient Prescriptions  Medication Sig Dispense Refill  . dabigatran (PRADAXA) 150 MG CAPS capsule Take 1 capsule (150 mg total) by mouth every 12 (twelve) hours.  60 capsule  0  . flecainide (TAMBOCOR) 100 MG tablet take 1 tablet by mouth twice a day PLEASE MAKE APPOINTMENT FOR FURTHER REFILLS  60 tablet  0  . lisinopril (PRINIVIL,ZESTRIL) 20 MG tablet Take 1 tablet (20 mg total) by mouth daily.  30 tablet  3  . NITROSTAT 0.4 MG SL tablet Place 0.4 mg under the tongue as needed for chest pain.        No current facility-administered medications for this visit.   Facility-Administered Medications Ordered in Other Visits  Medication Dose Route Frequency Provider Last Rate Last Dose  . gadobenate dimeglumine (MULTIHANCE) injection 20 mL  20 mL Intravenous Once Medication Radiologist, MD         Allergies:   Review of patient's allergies indicates no known allergies.   Social History:  The patient  reports that he quit smoking about 38 years ago. His  smoking use included Cigarettes. He has a 30 pack-year smoking history. He has never used smokeless tobacco. He reports that he drinks about 7 ounces of alcohol per week. He reports that he does not use illicit drugs.   Family History:  The patient's family history includes Arthritis in his mother; Cancer in his father; Hypertension in his mother; Stroke in his mother. There is no history of Heart attack.   ROS:  Please see the history of present illness.   He notes recent episode of BRBPR.  He has a lot of constipation.   All other systems reviewed and negative.   PHYSICAL EXAM: VS:  BP 137/67  Pulse 56  Ht 5\' 11"  (1.803 m)  Wt 209 lb (94.802 kg)  BMI 29.16 kg/m2 Well nourished, well developed, in no acute distress HEENT: normal Neck: no JVD Cardiac:  normal S1, S2; RRR; no murmur Lungs:   clear to auscultation bilaterally, no wheezing, rhonchi or rales Abd: soft, nontender, no hepatomegaly Ext:  Trace bilateral LE edema Skin: warm and dry Neuro:  CNs 2-12 intact, no focal abnormalities noted  EKG:  Sinus brady, HR 56, NSSTTW changes     ASSESSMENT AND PLAN:  1. Shortness of breath: This has been a chronic symptom for quite some time. He has had multiple tests in the past. There may be some slight worsening of his symptoms over time. He does not look acutely volume overloaded on exam. I will check a basic metabolic panel, BNP. I will also arrange an echocardiogram to assess his right heart pressures and diastolic function.  Plan follow up with Dr. Lovena Le. There was a suggestion of chronotropic incompetence in the past. Question if he requires further testing. I will defer this to Dr. Lovena Le. 2. Chronic atrial fibrillation: Maintaining NSR. Continue flecainide, Pradaxa. Check follow up CBC, basic metabolic panel. 3. Hematochezia: Patient had recent bright red blood per rectum. Check CBC. Followup with primary care. 4. Coronary artery disease: He had minimal CAD a cardiac catheterization in the recent past. No angina. He is not on aspirin as he is on Pradaxa. 5. HYPERTENSION: Controlled.    Disposition:  FU with Dr. Cristopher Peru in 3 mos.    Signed, Versie Starks, MHS 01/21/2014 4:06 PM    [facility] [place], [place], [place]  [address] Phone: [phone]; Fax: [phone]

## 2014-01-21 NOTE — Patient Instructions (Signed)
Your physician recommends that you continue on your current medications as directed. Please refer to the Current Medication list given to you today.  LAB WORK TODAY; BMET, CBC W/DIFF, BNP  Your physician has requested that you have an echocardiogram. Echocardiography is a painless test that uses sound waves to create images of your heart. It provides your doctor with information about the size and shape of your heart and how well your heart's chambers and valves are working. This procedure takes approximately one hour. There are no restrictions for this procedure.  PER SCOTT [name], PAC FOR YOU TO FOLLOW UP WITH PRIMARY CARE ABOUT BLOOD IN YOUR STOOL  Your physician recommends that you schedule a follow-up appointment in: 05/05/14 @ 2:15 WITH DR. Lovena Le

## 2014-01-22 ENCOUNTER — Other Ambulatory Visit: Payer: Self-pay | Admitting: Internal Medicine

## 2014-01-22 LAB — CBC WITH DIFFERENTIAL/PLATELET
Basophils Absolute: 0 10*3/uL (ref 0.0–0.1)
Basophils Relative: 0.3 % (ref 0.0–3.0)
EOS PCT: 1.1 % (ref 0.0–5.0)
Eosinophils Absolute: 0.1 10*3/uL (ref 0.0–0.7)
HEMATOCRIT: 44.5 % (ref 39.0–52.0)
HEMOGLOBIN: 15.1 g/dL (ref 13.0–17.0)
LYMPHS PCT: 25 % (ref 12.0–46.0)
Lymphs Abs: 2.1 10*3/uL (ref 0.7–4.0)
MCHC: 34 g/dL (ref 30.0–36.0)
MCV: 97.9 fl (ref 78.0–100.0)
MONOS PCT: 6.5 % (ref 3.0–12.0)
Monocytes Absolute: 0.5 10*3/uL (ref 0.1–1.0)
NEUTROS ABS: 5.6 10*3/uL (ref 1.4–7.7)
Neutrophils Relative %: 67.1 % (ref 43.0–77.0)
Platelets: 302 10*3/uL (ref 150.0–400.0)
RBC: 4.55 Mil/uL (ref 4.22–5.81)
RDW: 13 % (ref 11.5–15.5)
WBC: 8.3 10*3/uL (ref 4.0–10.5)

## 2014-01-22 LAB — BRAIN NATRIURETIC PEPTIDE: Pro B Natriuretic peptide (BNP): 84 pg/mL (ref 0.0–100.0)

## 2014-01-22 LAB — BASIC METABOLIC PANEL
BUN: 25 mg/dL — ABNORMAL HIGH (ref 6–23)
CALCIUM: 9.6 mg/dL (ref 8.4–10.5)
CO2: 30 mEq/L (ref 19–32)
Chloride: 101 mEq/L (ref 96–112)
Creatinine, Ser: 1.2 mg/dL (ref 0.4–1.5)
GFR: 62.44 mL/min (ref >60.00)
Glucose, Bld: 80 mg/dL (ref 70–99)
POTASSIUM: 5.1 meq/L (ref 3.5–5.1)
SODIUM: 136 meq/L (ref 135–145)

## 2014-01-23 ENCOUNTER — Telehealth: Payer: Self-pay | Admitting: *Deleted

## 2014-01-23 ENCOUNTER — Other Ambulatory Visit: Payer: Self-pay

## 2014-01-23 MED ORDER — LISINOPRIL 20 MG PO TABS: 20.0000 mg | ORAL_TABLET | Freq: Every day | ORAL | Status: DC

## 2014-01-23 NOTE — Telephone Encounter (Signed)
pt notified about lab results with verbal understanding after explaining to pt sevral times of what the lab test meant.

## 2014-01-27 ENCOUNTER — Encounter: Payer: Self-pay | Admitting: Physician Assistant

## 2014-01-27 ENCOUNTER — Telehealth: Payer: Self-pay | Admitting: *Deleted

## 2014-01-27 ENCOUNTER — Ambulatory Visit (HOSPITAL_COMMUNITY): Payer: Medicare Other | Attending: Physician Assistant

## 2014-01-27 DIAGNOSIS — I059 Rheumatic mitral valve disease, unspecified: Secondary | ICD-10-CM | POA: Diagnosis not present

## 2014-01-27 DIAGNOSIS — I2510 Atherosclerotic heart disease of native coronary artery without angina pectoris: Secondary | ICD-10-CM | POA: Diagnosis not present

## 2014-01-27 DIAGNOSIS — I079 Rheumatic tricuspid valve disease, unspecified: Secondary | ICD-10-CM | POA: Diagnosis not present

## 2014-01-27 DIAGNOSIS — I517 Cardiomegaly: Secondary | ICD-10-CM | POA: Diagnosis not present

## 2014-01-27 DIAGNOSIS — C4491 Basal cell carcinoma of skin, unspecified: Secondary | ICD-10-CM | POA: Insufficient documentation

## 2014-01-27 DIAGNOSIS — I4891 Unspecified atrial fibrillation: Secondary | ICD-10-CM | POA: Insufficient documentation

## 2014-01-27 DIAGNOSIS — E785 Hyperlipidemia, unspecified: Secondary | ICD-10-CM | POA: Insufficient documentation

## 2014-01-27 DIAGNOSIS — G459 Transient cerebral ischemic attack, unspecified: Secondary | ICD-10-CM | POA: Diagnosis not present

## 2014-01-27 DIAGNOSIS — R0602 Shortness of breath: Secondary | ICD-10-CM | POA: Diagnosis not present

## 2014-01-27 DIAGNOSIS — I4892 Unspecified atrial flutter: Secondary | ICD-10-CM | POA: Diagnosis not present

## 2014-01-27 DIAGNOSIS — Z87891 Personal history of nicotine dependence: Secondary | ICD-10-CM | POA: Diagnosis not present

## 2014-01-27 DIAGNOSIS — I10 Essential (primary) hypertension: Secondary | ICD-10-CM | POA: Diagnosis not present

## 2014-01-27 NOTE — Progress Notes (Signed)
2D Echo completed. 01/27/2014

## 2014-01-27 NOTE — Telephone Encounter (Signed)
pt notified about echo results, EF 55-60 % normal LV function, normal diastolic function. Pt said he has been SOB of a for a long time now. I advised maybe call PCP to discuss further. Pt said ok and thank you.

## 2014-02-10 ENCOUNTER — Other Ambulatory Visit: Payer: Self-pay | Admitting: Family Medicine

## 2014-02-23 ENCOUNTER — Other Ambulatory Visit: Payer: Self-pay

## 2014-02-23 MED ORDER — DABIGATRAN ETEXILATE MESYLATE 150 MG PO CAPS: 150.0000 mg | ORAL_CAPSULE | Freq: Two times a day (BID) | ORAL | Status: DC

## 2014-02-23 MED ORDER — FLECAINIDE ACETATE 100 MG PO TABS: 100.0000 mg | ORAL_TABLET | Freq: Two times a day (BID) | ORAL | Status: DC

## 2014-04-20 ENCOUNTER — Encounter (HOSPITAL_COMMUNITY): Payer: Self-pay | Admitting: Emergency Medicine

## 2014-04-20 ENCOUNTER — Emergency Department (HOSPITAL_COMMUNITY)
Admission: EM | Admit: 2014-04-20 | Discharge: 2014-04-20 | Disposition: A | Discharge status: Home / Self Care | Payer: Medicare Other | Attending: Emergency Medicine | Admitting: Emergency Medicine

## 2014-04-20 ENCOUNTER — Emergency Department (HOSPITAL_COMMUNITY): Payer: Medicare Other

## 2014-04-20 ENCOUNTER — Telehealth: Payer: Self-pay | Admitting: Internal Medicine

## 2014-04-20 DIAGNOSIS — Z79899 Other long term (current) drug therapy: Secondary | ICD-10-CM | POA: Insufficient documentation

## 2014-04-20 DIAGNOSIS — I480 Paroxysmal atrial fibrillation: Secondary | ICD-10-CM | POA: Diagnosis not present

## 2014-04-20 DIAGNOSIS — I2510 Atherosclerotic heart disease of native coronary artery without angina pectoris: Secondary | ICD-10-CM | POA: Insufficient documentation

## 2014-04-20 DIAGNOSIS — Z87891 Personal history of nicotine dependence: Secondary | ICD-10-CM | POA: Diagnosis not present

## 2014-04-20 DIAGNOSIS — R002 Palpitations: Secondary | ICD-10-CM | POA: Diagnosis present

## 2014-04-20 DIAGNOSIS — Z8673 Personal history of transient ischemic attack (TIA), and cerebral infarction without residual deficits: Secondary | ICD-10-CM | POA: Diagnosis not present

## 2014-04-20 DIAGNOSIS — Z9889 Other specified postprocedural states: Secondary | ICD-10-CM | POA: Insufficient documentation

## 2014-04-20 DIAGNOSIS — Z8719 Personal history of other diseases of the digestive system: Secondary | ICD-10-CM | POA: Diagnosis not present

## 2014-04-20 DIAGNOSIS — Z85828 Personal history of other malignant neoplasm of skin: Secondary | ICD-10-CM | POA: Insufficient documentation

## 2014-04-20 DIAGNOSIS — I4892 Unspecified atrial flutter: Secondary | ICD-10-CM | POA: Insufficient documentation

## 2014-04-20 DIAGNOSIS — I10 Essential (primary) hypertension: Secondary | ICD-10-CM | POA: Insufficient documentation

## 2014-04-20 DIAGNOSIS — Z7902 Long term (current) use of antithrombotics/antiplatelets: Secondary | ICD-10-CM | POA: Insufficient documentation

## 2014-04-20 DIAGNOSIS — Z8639 Personal history of other endocrine, nutritional and metabolic disease: Secondary | ICD-10-CM | POA: Insufficient documentation

## 2014-04-20 LAB — CBC WITH DIFFERENTIAL/PLATELET
Basophils Absolute: 0.1 10*3/uL (ref 0.0–0.1)
Basophils Relative: 1 % (ref 0–1)
Eosinophils Absolute: 0.1 10*3/uL (ref 0.0–0.7)
Eosinophils Relative: 1 % (ref 0–5)
HCT: 46.3 % (ref 39.0–52.0)
Hemoglobin: 15.9 g/dL (ref 13.0–17.0)
Lymphocytes Relative: 24 % (ref 12–46)
Lymphs Abs: 1.8 10*3/uL (ref 0.7–4.0)
MCH: 33.1 pg (ref 26.0–34.0)
MCHC: 34.3 g/dL (ref 30.0–36.0)
MCV: 96.5 fL (ref 78.0–100.0)
Monocytes Absolute: 0.8 10*3/uL (ref 0.1–1.0)
Monocytes Relative: 11 % (ref 3–12)
Neutro Abs: 4.8 10*3/uL (ref 1.7–7.7)
Neutrophils Relative %: 63 % (ref 43–77)
Platelets: 232 10*3/uL (ref 150–400)
RBC: 4.8 MIL/uL (ref 4.22–5.81)
RDW: 12.3 % (ref 11.5–15.5)
WBC: 7.5 10*3/uL (ref 4.0–10.5)

## 2014-04-20 LAB — BASIC METABOLIC PANEL
Anion gap: 15 (ref 5–15)
BUN: 16 mg/dL (ref 6–23)
CO2: 23 mEq/L (ref 19–32)
Calcium: 8.8 mg/dL (ref 8.4–10.5)
Chloride: 105 mEq/L (ref 96–112)
Creatinine, Ser: 0.91 mg/dL (ref 0.50–1.35)
GFR calc Af Amer: >90 mL/min (ref >90)
GFR calc non Af Amer: 82 mL/min — ABNORMAL LOW (ref >90)
Glucose, Bld: 110 mg/dL — ABNORMAL HIGH (ref 70–99)
Potassium: 4.2 mEq/L (ref 3.7–5.3)
Sodium: 143 mEq/L (ref 137–147)

## 2014-04-20 LAB — I-STAT TROPONIN, ED: Troponin i, poc: 0 ng/mL (ref 0.00–0.08)

## 2014-04-20 MED ORDER — ACETAMINOPHEN 500 MG PO TABS
1000.0000 mg | ORAL_TABLET | Freq: Once | ORAL | Status: AC
  Administered 2014-04-20: 1000 mg via ORAL
  Filled 2014-04-20: qty 2

## 2014-04-20 NOTE — Telephone Encounter (Signed)
New msg   Patient's wife Bryna Colander calling, states she was instructed to call to follow up with Claiborne Billings. Please contact at [phone].

## 2014-04-20 NOTE — Discharge Instructions (Signed)
Return here as needed. Follow up with your Cardiologist for a recheck.

## 2014-04-20 NOTE — ED Provider Notes (Signed)
CSN: [id number]     Arrival date & time 04/20/14  0706 History   First MD Initiated Contact with Patient 04/20/14 0732     Chief Complaint  Patient presents with  . Palpitations     (Consider location/radiation/quality/duration/timing/severity/associated sxs/prior Treatment) HPI Patient presents to the emergency department with a complaint of palpitations that started around 2 AM the patient states that just like his atrial fibrillation.  The patient states that he took his flecainide and nitroglycerin.  The patient states now his symptoms have totally resolved.  Patient states that he did have some tight feeling in his chest during that episode patient that the shortness of breath, nausea, vomiting, diaphoresis, abdominal pain, weakness, dizziness, blurred vision, back pain, neck pain, fever, cough, rash, near syncope or syncope.  The patient states that nothing seemed to make his condition, better or worse Past Medical History  Diagnosis Date  . Atrial flutter     s/p RFCA  by GT  . Dyslipidemia   . Hypertension   . Atrial fibrillation     Paroxysmal, s/p PVI 11/11/10  . Basal cell carcinoma of ear   . GERD (gastroesophageal reflux disease)   . CAD (coronary artery disease)     a. cath 5/12: pLAD 25%, D1 25%, EF 65 % (done 2/2 abnormal myoview with inferapical  ischemia)  . TIA (transient ischemic attack)     11/12/10 post afib ablation  . Snoring     a. sleep study 11/2012 neg for OSA; increased leg movement suspicious for primary movement d/o (ie restless leg syndrome)   . Hx of echocardiogram     Echo (9/15):  Mild LVH, EF 55-60%, no RWMA, normal diast function, mild MR, mod LAE, mildly increased pulmonary pressures (PASP 30 mmHg).   Past Surgical History  Procedure Laterality Date  . Knee arthroscopy      bilateral  . Atrial flutter ablation      by GT  . Basal cell cancer resection    . Hemorrhoid surgery    . Eye surgery    . Tee without cardioversion  10/31/2011   Procedure: TRANSESOPHAGEAL ECHOCARDIOGRAM (TEE);  Surgeon: Jolaine Artist, MD;  Location: [facility] ENDOSCOPY;  Service: Cardiovascular;  Laterality: N/A;  . Cardioversion  10/31/2011    Procedure: CARDIOVERSION;  Surgeon: Jolaine Artist, MD;  [facility] ENDOSCOPY;  Service: Cardiovascular;  Laterality: N/A;   Family History  Problem Relation Age of Onset  . Hypertension Mother   . Stroke Mother   . Cancer Father   . Arthritis Mother   . Heart attack Neg Hx    History  Substance Use Topics  . Smoking status: Former Smoker -- 1.50 packs/day for 20 years    Types: Cigarettes    Quit date: 05/23/1975  . Smokeless tobacco: Never Used  . Alcohol Use: 7.0 oz/week    14 drink(s) per week     Comment: 2 drinks per night    Review of Systems All other systems negative except as documented in the HPI. All pertinent positives and negatives as reviewed in the HPI.   Allergies  Review of patient's allergies indicates no known allergies.  Home Medications   Prior to Admission medications   Medication Sig Start Date End Date Taking? Authorizing Provider  dabigatran (PRADAXA) 150 MG CAPS capsule Take 1 capsule (150 mg total) by mouth every 12 (twelve) hours. 02/23/14  Yes Evans Lance, MD  flecainide (TAMBOCOR) 100 MG tablet Take 1 tablet (100 mg total)  by mouth 2 (two) times daily. 02/23/14  Yes Evans Lance, MD  NITROSTAT 0.4 MG SL tablet Place 0.4 mg under the tongue as needed for chest pain.  10/08/10  Yes Historical Provider, MD  lisinopril (PRINIVIL,ZESTRIL) 20 MG tablet Take 1 tablet (20 mg total) by mouth daily. Patient not taking: Reported on 04/20/2014 01/23/14   Evans Lance, MD   BP 109/71 mmHg  Pulse 50  Temp(Src) 97.6 F (36.4 C) (Oral)  Resp 18  Ht 5\' 11"  (1.803 m)  Wt 200 lb (90.719 kg)  BMI 27.91 kg/m2  SpO2 97% Physical Exam  Constitutional: He is oriented to person, place, and time. He appears well-developed and well-nourished. No distress.  HENT:  Head:  Normocephalic and atraumatic.  Mouth/Throat: Oropharynx is clear and moist.  Eyes: Pupils are equal, round, and reactive to light.  Neck: Normal range of motion. Neck supple.  Cardiovascular: Normal rate, regular rhythm and normal heart sounds.  Exam reveals no gallop and no friction rub.   No murmur heard. Pulmonary/Chest: Effort normal and breath sounds normal. No respiratory distress.  Musculoskeletal: Normal range of motion. He exhibits no edema.  Neurological: He is alert and oriented to person, place, and time. He exhibits normal muscle tone. Coordination normal.  Skin: Skin is warm and dry. No rash noted. No erythema.  Nursing note and vitals reviewed.   ED Course  Procedures (including critical care time) Labs Review Labs Reviewed  BASIC METABOLIC PANEL - Abnormal; Notable for the following:    Glucose, Bld 110 (*)    GFR calc non Af Amer 82 (*)    All other components within normal limits  CBC WITH DIFFERENTIAL  Randolm Idol, ED    Imaging Review Dg Chest 2 View  04/20/2014   CLINICAL DATA:  Atrial fibrillation.  EXAM: CHEST  2 VIEW  COMPARISON:  Chest radiograph June 04, 2013 and chest CT June 04, 2013  FINDINGS: There is scarring in the left base region. There is no edema or consolidation. The heart size and pulmonary vascularity are normal. No adenopathy. There is a small hiatal hernia. There is displaced rib fractures on the left, stable.  IMPRESSION: Scarring left base. No edema or consolidation. Small hiatal hernia. Old displaced rib fractures on left.   Electronically Signed   By: Lowella Grip M.D.   On: 04/20/2014 08:26     EKG Interpretation   Date/Time:  Monday April 20 2014 07:09:50 EST Ventricular Rate:  64 PR Interval:  196 QRS Duration: 82 QT Interval:  406 QTC Calculation: 418 R Axis:   -47 Text Interpretation:  Normal sinus rhythm Left axis deviation Confirmed by  Ashok Cordia  MD, Lennette Bihari ([id number]) on 04/20/2014 7:24:27 AM     Patient has  been stable here in the emergency department and monitored over several hours.  He does not have any dictations or signs of atrial fibrillation is uncontrolled.  The patient is advised to follow-up with his cardiologist to return here as needed.   Brent General, PA-C 04/20/14 [place], MD 04/20/14 [phone]

## 2014-04-20 NOTE — Telephone Encounter (Signed)
Spoke with with patients wife, he was seen in the ER today for Afib.  He had missed several doses of Flecainide during the week and went out of rhythm today.  He told her that she needed to take him to the ER.  He took Diltiazem from 2009 today.  I have asked her to call the office from now on instead of going to the ER.  It did not seem as though he was going very fast and that we may have been able to treat him as an out patient and avoid the ER charge.  She says he had not missed any of his blood thinner so he could have taken extra Flecainide and perhaps converted at home without going to the ER.  She will call next time and was appreciative of my call.  He is feeling fine and had converted by the time he saw the MD in ER

## 2014-04-20 NOTE — ED Notes (Signed)
Pt with palpitations and hx of afib; pt sts feels same; pt denies SOB or CP; pt sts took cardiazem and nitro at home

## 2014-04-20 NOTE — ED Notes (Signed)
Declined W/C at D/C and was escorted to lobby by RN. 

## 2014-04-30 ENCOUNTER — Telehealth: Payer: Self-pay | Admitting: Internal Medicine

## 2014-04-30 NOTE — Telephone Encounter (Signed)
Advised patient to keep follow up as scheduled

## 2014-04-30 NOTE — Telephone Encounter (Signed)
New msg  Pt informed of 3 month f/u appt on 05/05/14, pt wants to know if it is necessary to come because he has spoken with nurse about afib a few days ago. Please contact at [phone]

## 2014-05-05 ENCOUNTER — Encounter: Payer: Self-pay | Admitting: Internal Medicine

## 2014-05-05 ENCOUNTER — Ambulatory Visit (INDEPENDENT_AMBULATORY_CARE_PROVIDER_SITE_OTHER): Payer: Medicare Other | Admitting: Internal Medicine

## 2014-05-05 VITALS — BP 180/84 | HR 54 | Ht 71.0 in | Wt 212.2 lb

## 2014-05-05 DIAGNOSIS — I483 Typical atrial flutter: Secondary | ICD-10-CM

## 2014-05-05 DIAGNOSIS — I482 Chronic atrial fibrillation, unspecified: Secondary | ICD-10-CM

## 2014-05-05 DIAGNOSIS — I10 Essential (primary) hypertension: Secondary | ICD-10-CM

## 2014-05-05 DIAGNOSIS — G459 Transient cerebral ischemic attack, unspecified: Secondary | ICD-10-CM

## 2014-05-05 MED ORDER — NITROGLYCERIN 0.4 MG SL SUBL: 0.4000 mg | SUBLINGUAL_TABLET | SUBLINGUAL | Status: DC | PRN

## 2014-05-05 NOTE — Assessment & Plan Note (Signed)
His systolic blood pressure is elevated today. The patient states that he takes it at home regularly, and at home, his blood pressure has been well-controlled. For this reason, we will not change his blood pressure medications at this time, he is encouraged to maintain a low-sodium diet.

## 2014-05-05 NOTE — Assessment & Plan Note (Signed)
The patient had a breakthrough episode of what sounds like atrial fibrillation as he was off of his medications. He has restarted flecainide and systemic anticoagulation. He has had no recurrent palpitations. He will continue his current medical therapy. I've warned the patient not to stop his medicines. In addition, he is encouraged to refrain from alcohol use, which she has had problems with in the past.

## 2014-05-05 NOTE — Assessment & Plan Note (Signed)
He is status post ablation, with no recurrent atrial flutter.

## 2014-05-05 NOTE — Assessment & Plan Note (Signed)
He will continue his systemic anticoagulation. He has no evidence of bleeding.

## 2014-05-05 NOTE — Progress Notes (Signed)
HPI Erik [name] returns today for followup. He is a pleasant 73 yo man with a h/o HTN, PAF, remote h/o atrial flutter, s/p ablation, h/o atrial fib, s/p ablation, and non-compliance. He stopped his medication and developed recurrent palpitations with a RVR and presented to the ED where his symptoms resolved just before obtaining an ECG. He has done well in the interim. He is certain that at home his blood pressure is well controlled and denies any heart failure symptoms. 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 No syncope. No Known Allergies   Current Outpatient Prescriptions  Medication Sig Dispense Refill  . dabigatran (PRADAXA) 150 MG CAPS capsule Take 1 capsule (150 mg total) by mouth every 12 (twelve) hours. 60 capsule 6  . flecainide (TAMBOCOR) 100 MG tablet Take 1 tablet (100 mg total) by mouth 2 (two) times daily. 60 tablet 6  . nitroGLYCERIN (NITROSTAT) 0.4 MG SL tablet Place 1 tablet (0.4 mg total) under the tongue every 5 (five) minutes as needed for chest pain (MAX 3 TABLETS). 25 tablet 11   No current facility-administered medications for this visit.   Facility-Administered Medications Ordered in Other Visits  Medication Dose Route Frequency Provider Last Rate Last Dose  . gadobenate dimeglumine (MULTIHANCE) injection 20 mL  20 mL Intravenous Once Medication Radiologist, MD         Past Medical History  Diagnosis Date  . Atrial flutter     s/p RFCA  by GT  . Dyslipidemia   . Hypertension   . Atrial fibrillation     Paroxysmal, s/p PVI 11/11/10  . Basal cell carcinoma of ear   . GERD (gastroesophageal reflux disease)   . CAD (coronary artery disease)     a. cath 5/12: pLAD 25%, D1 25%, EF 65 % (done 2/2 abnormal myoview with inferapical  ischemia)  . TIA (transient ischemic attack)     11/12/10 post afib ablation  . Snoring     a. sleep study 11/2012 neg for OSA; increased leg movement suspicious for primary movement d/o (ie restless leg syndrome)   . Hx of  echocardiogram     Echo (9/15):  Mild LVH, EF 55-60%, no RWMA, normal diast function, mild MR, mod LAE, mildly increased pulmonary pressures (PASP 30 mmHg).    ROS:   All systems reviewed and negative except as noted in the HPI.   Past Surgical History  Procedure Laterality Date  . Knee arthroscopy      bilateral  . Atrial flutter ablation      by GT  . Basal cell cancer resection    . Hemorrhoid surgery    . Eye surgery    . Tee without cardioversion  10/31/2011    Procedure: TRANSESOPHAGEAL ECHOCARDIOGRAM (TEE);  Surgeon: Jolaine Artist, MD;  Location: [facility] ENDOSCOPY;  Service: Cardiovascular;  Laterality: N/A;  . Cardioversion  10/31/2011    Procedure: CARDIOVERSION;  Surgeon: Jolaine Artist, MD;  Location: [facility] ENDOSCOPY;  Service: Cardiovascular;  Laterality: N/A;     Family History  Problem Relation Age of Onset  . Hypertension Mother   . Stroke Mother   . Cancer Father   . Arthritis Mother   . Heart attack Neg Hx      History   Social History  . Marital Status: Married    Spouse Name: N/A    Number of Children: 1  . Years of Education: N/A   Occupational History  . 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 Smoking status: Former Smoker -- 1.50 packs/day for 20 years    Types: Cigarettes    Quit date: 05/23/1975  . Smokeless tobacco: Never Used  . Alcohol Use: 7.0 oz/week    14 drink(s) per week     Comment: 2 drinks per night  . Drug Use: No  . Sexual Activity: Not on file   Other Topics Concern  . Not on file   Social History Narrative     BP 180/84 mmHg  Pulse 54  Ht 5\' 11"  (1.803 m)  Wt 212 lb 3.2 oz (96.253 kg)  BMI 29.61 kg/m2  Physical Exam:  Well appearing 73 year old man, NAD HEENT: Unremarkable Neck:  No JVD, no thyromegally Lymphatics:  No adenopathy Back:  No CVA tenderness Lungs:  Clear, clear with no wheezes, rales, or rhonchi. HEART:  Regular rate rhythm, no murmurs, no rubs,  no clicks Abd:  soft, positive bowel sounds, no organomegally, no rebound, no guarding Ext:  2 plus pulses, no edema, no cyanosis, no clubbing Skin:  No rashes no nodules Neuro:  CN II through XII intact, motor grossly intact  EKG - normal sinus rhythm with sinus bradycardia and left axis deviation.   Assess/Plan:

## 2014-05-05 NOTE — Patient Instructions (Signed)
Your physician wants you to follow-up in: 6 months wit Dr. Knox Saliva will receive a reminder letter in the mail two months in advance. If you don't receive a letter, please call our office to schedule the follow-up appointment.  Check your Blood pressure at home if systolic Blood pressure > 135 call us at [phone]

## 2014-05-07 ENCOUNTER — Telehealth: Payer: Self-pay | Admitting: Physician Assistant

## 2014-05-07 NOTE — Telephone Encounter (Signed)
Patient with h/o HTN, PAF and a-flutter s/p ablation for both paged after hour cardiology for SBP ranging in 150-200s. I have instructed the patient to restarted on his lisinopril 10 mg BID (20mg  daily, split in half, take in twice a day) if SBP >160. It appears even when he saw Dr. Lovena Le 2 days ago, his BP was in 180s. I have instructed him to document daily BP, if consistently high, will permanently restart BP. For now, he says he has enough lisinopril left over from before.  Hilbert Corrigan PA Pager: [phone]

## 2014-06-25 ENCOUNTER — Other Ambulatory Visit: Payer: Self-pay

## 2014-06-25 MED ORDER — DABIGATRAN ETEXILATE MESYLATE 150 MG PO CAPS: 150.0000 mg | ORAL_CAPSULE | Freq: Two times a day (BID) | ORAL | Status: DC

## 2014-06-25 MED ORDER — FLECAINIDE ACETATE 100 MG PO TABS: 100.0000 mg | ORAL_TABLET | Freq: Two times a day (BID) | ORAL | Status: DC

## 2014-08-07 ENCOUNTER — Ambulatory Visit
Admission: RE | Admit: 2014-08-07 | Discharge: 2014-08-07 | Disposition: A | Discharge status: Home / Self Care | Payer: Medicare Other | Source: Ambulatory Visit | Attending: Physician Assistant | Admitting: Physician Assistant

## 2014-08-07 ENCOUNTER — Other Ambulatory Visit: Payer: Self-pay | Admitting: Physician Assistant

## 2014-08-07 DIAGNOSIS — R0602 Shortness of breath: Secondary | ICD-10-CM

## 2014-09-01 ENCOUNTER — Ambulatory Visit (INDEPENDENT_AMBULATORY_CARE_PROVIDER_SITE_OTHER): Payer: Medicare Other | Admitting: Pulmonary Disease

## 2014-09-01 ENCOUNTER — Encounter (INDEPENDENT_AMBULATORY_CARE_PROVIDER_SITE_OTHER): Payer: Self-pay

## 2014-09-01 ENCOUNTER — Encounter: Payer: Self-pay | Admitting: Pulmonary Disease

## 2014-09-01 VITALS — BP 106/70 | HR 64 | Temp 97.1°F | Ht 71.0 in | Wt 212.0 lb

## 2014-09-01 DIAGNOSIS — R0600 Dyspnea, unspecified: Secondary | ICD-10-CM

## 2014-09-01 DIAGNOSIS — G458 Other transient cerebral ischemic attacks and related syndromes: Secondary | ICD-10-CM

## 2014-09-01 DIAGNOSIS — G4761 Periodic limb movement disorder: Secondary | ICD-10-CM | POA: Diagnosis not present

## 2014-09-01 DIAGNOSIS — R0683 Snoring: Secondary | ICD-10-CM | POA: Diagnosis not present

## 2014-09-01 DIAGNOSIS — I480 Paroxysmal atrial fibrillation: Secondary | ICD-10-CM

## 2014-09-01 DIAGNOSIS — I2510 Atherosclerotic heart disease of native coronary artery without angina pectoris: Secondary | ICD-10-CM

## 2014-09-01 DIAGNOSIS — R0602 Shortness of breath: Secondary | ICD-10-CM | POA: Diagnosis not present

## 2014-09-01 DIAGNOSIS — I10 Essential (primary) hypertension: Secondary | ICD-10-CM

## 2014-09-01 MED ORDER — PRAMIPEXOLE DIHYDROCHLORIDE 0.25 MG PO TABS: ORAL_TABLET | ORAL | Status: DC

## 2014-09-01 NOTE — Patient Instructions (Signed)
Today we updated your med list in our EPIC system...    Continue your current medications the same...  We decided to try to improve your breathing & allow for a good deep breath by using a "lung exerciser">    In this regard> use the INCENTIVE SPIROMETER every 2-3H during the day (and more often if sitting down watching TV- eg. every hour, or during the commercials).  I implore you to increase your exercise in a formal exercise program (on your own, at the Y, etc) by walking every day and gradually increasing the speed 7 distance...  We are also prescribing MIRAPEX to try about 1-2H prior to bedtime>    Start w/ one tab & increase to 2 tabs after 2 weeks...  Let's plan a follow up visit in 1 month or so.Marland KitchenMarland Kitchen

## 2014-09-20 NOTE — Progress Notes (Signed)
Subjective:    Patient ID: Erik [name], male    DOB: 1941-03-20, 74 y.o.   MRN: [id number]  HPI 74 y/o WM referred by Dr. Donnie Coffin for pulmonary evaluation>>   ~  October 24, 2010:  Pulm Consult w/ [facility]:  The pt is a 74y/o male who I have been asked to see for dyspnea. He has been having breathing issues for 3-4 yrs, and feels his symptoms have been progressive. The past one year it has been a lot worse. He feels "dizzy" with the sob at times. He describes a less than 2 block doe at a moderate pace on flat ground, and will get winded bringing groceries in from the car. He has the most issues with carrying loads. He denies any cough, congestion, or mucus. He has not had issues with LE edema. He describes the "constant feeling of a softball in center of chest". His most recent cxr last month was ok, and he has had a negative cath last month as well. His echo showed mild LA enlargement and nl LV fxn. He has had pfts in the past, but can't remember when or where. Full PFTs showed> WNL including lung volumes and diffusion...  ~  February 07, 2011:  f/u OV w/ [facility]:  The patient comes in today for followup of persistent dyspnea.  He has had x-rays and pulmonary function studies with no obvious pulmonary abnormalities being found.  He has been evaluated by cardiology, and they have not found a specific etiology for his dyspnea.  He comes in today for further evaluation.  He continues to have significant dyspnea on exertion that is impacting his quality of life, but denies any cough or congestion.  He does admit recurrence of his atrial fibrillation starting yesterday.  Cardiopulmonary stress test showed> an inadeq HR response to exercise (chronotropic incompetence)...  ~  September 01, 2014:  Pulm Consult w/ SN:  Pt referred by DrMitchell for on-going symptom of SOB "I can't utilize all my oxygen" states it's been present for yrs and getting worse he thinks... 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  The pt's hx is rather vague & seems similar to prev complaints documented by DrClance in 2012and by Cariology over the yrs (prev notes by DrClance & Cards- all reviewed)...  EXAM reveals Afeb, VSS, O2sat=98% on RA at rest;  HEENT- neg, Mallampati2;  Chest- clear w/o w/r/r, no incr WOB;  Heart- RR w/o m/r/g;  Ext- neg w/o c/c/e...    (NOTE: I have personally reviewed the XRay & Scan images in Epic, and interpreted the PFTs & other studies below)  Cardiac Nuclear study 09/28/10 showed a small partially reversible apical perfusion defect thought to be ischemic; norm wall mption & EF=72%, he had chest  tightness/ CP but no EKG changes...  Cardiac Cath 09/28/10 showed mild non-obstructive CAD w/ 25% stenosis in LAD & Diagonal, EF=65%  Full PFTs 10/31/10 showed FVC=3.79 (85%), FEV1=2.87 (88%), %1sec=76, mid-flows were wnl at 94% predicted; Lung volumes (Rv & TLC) were wnl; DLCO was wnl as well...   Sleep study 12/09/12> mod snoring but no signif OSA; he did have signif leg jerks w/ 5/hr resulting in arousal (c/w PLM disorder or RLS)  CPST 02/15/11 showed inadeq HR response to exercise, chronotropic incompetence & he developed ?AV block & junctional rhythm...  CT Chest 06/04/13 (fell on ice) showed fx left 6th & 7th ribs, sm left effusion vs hemothorax, atelectasis, mild cardiomeg, no  adenopathy...  CXR 08/07/14 showed mild cardiomeg, mult old left rib fxs, sl incr markings w/o acute changes, small HH, multilevelo DDD...  Spirometry 09/01/14 showed FVC=2.84 (62%), FEV1=2.33 (67%), %1sec=82, mid-flows were wnl at 80% predicted; c/w mod restriction but no obstructive dis demonstrated...  Ambulatory Oxygen saturation test> O2sat=96% on RA at rest w/ pulse=52/min;  He walked 3 laps w/ lowest O2 sat=91% w/ pulse=84/min...   IMP/PLAN>>  There does not appear to have been any recent developments to explain DrMitchell's concern as to why the pt suddenly started c/o dyspnea again; by description his SOB seems similar to that described in 2012; his symptoms have surely been multifactorial in etiology w/ cardiac issues as the primary concern given his neg CXR & prev normal PFTs & norm oxygen saturations; now his spirometry appears sl restricted, he has known chronotropic incompetence, PAF, hx Aflutter etc; he is also quite anxious w/ a number of family issues... Regarding his prev work up & data collected- he did not have OSA but had signif leg jerks w/ arousals & this was neveraddressed- I rec trial of MIRAPEX 0.25mg  1-2 Qhs;  In addition he is instructed to concentrate on good deep breaths and given an Chiropodist for this purpose;  Finally we discussed the need for a gradual exercise program...    Past Medical History  Diagnosis Date   Followed by DrTaylor- last seen 12/15 for HBP, PAF, Hx AFlutter s/p ablation, and non-compliance (he stopped meds w/ breakthrough symptoms, using Etoh) & placed back on Lisinopril20, Flecainide100Bid, Pradaxa150Bid...   . Atrial flutter >>      s/p RFCA  by GT  . Dyslipidemia   . Hypertension >>   . Atrial fibrillation >>     Paroxysmal, s/p PVI 11/11/10  . Basal cell carcinoma of ear   . GERD (gastroesophageal reflux disease)   . CAD (coronary artery disease) >>     a. cath 5/12: pLAD 25%, D1 25%, EF 65 % (done 2/2 abnormal myoview with  inferapical  ischemia)  . TIA (transient ischemic attack)     11/12/10 post afib ablation  . Snoring     a. sleep study 11/2012 neg for OSA; increased leg movement suspicious for primary movement d/o (ie restless leg syndrome)   . Hx of echocardiogram     Echo (9/15):  Mild LVH, EF 55-60%, no RWMA, normal diast function, mild MR, mod LAE, mildly increased pulmonary pressures (PASP 30 mmHg).   Past Surgical History  Procedure Laterality Date  . Knee arthroscopy      bilateral  . Atrial flutter ablation      by GT  . Basal cell cancer resection    . Hemorrhoid surgery    . Eye surgery    . Tee without cardioversion  10/31/2011  Procedure: TRANSESOPHAGEAL ECHOCARDIOGRAM (TEE);  Surgeon: Jolaine Artist, MD;  Location: [facility] ENDOSCOPY;  Service: Cardiovascular;  Laterality: N/A;  . Cardioversion  10/31/2011    Procedure: CARDIOVERSION;  Surgeon: Jolaine Artist, MD;  Location: [facility] ENDOSCOPY;  Service: Cardiovascular;  Laterality: N/A;    Patient's Medications >> as of 09/01/14  New Prescriptions  Previous Medications   DABIGATRAN (PRADAXA) 150 MG CAPS CAPSULE    Take 1 capsule (150 mg total) by mouth every 12 (twelve) hours.   FLECAINIDE (TAMBOCOR) 100 MG TABLET    Take 1 tablet (100 mg total) by mouth 2 (two) times daily.   LISINOPRIL (PRINIVIL,ZESTRIL) 20 MG TABLET    10 mg 2 (two) times daily.   NITROGLYCERIN (NITROSTAT) 0.4 MG SL TABLET    Place 1 tablet (0.4 mg total) under the tongue every 5 (five) minutes as needed for chest pain (MAX 3 TABLETS).  Modified Medications   No medications on file  Discontinued Medications   No medications on file   No Known Allergies   Family History  Problem Relation Age of Onset  . Hypertension Mother   . Stroke Mother   . Heart disease Mother   . Arthritis Mother   . Heart attack Neg Hx    History   Social History  . Marital Status: Divorced    Spouse Name: N/A  . Number of Children: 1  . Years of Education: N/A   Occupational  History  . 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 Smoking status: Former Smoker -- 1.50 packs/day for 20 years    Types: Cigarettes    Quit date: 05/23/1975  . Smokeless tobacco: Never Used  . Alcohol Use: 7.0 oz/week    14 drink(s) per week     Comment: 2 drinks per night  . Drug Use: No  . Sexual Activity: Not on file   Other Topics Concern  . Not on file   Social History Narrative    Current Medications, Allergies, Past Medical History, Past Surgical History, Family History, and Social History were reviewed in [employer] record.   Review of Systems  Constitutional: Negative for fever and unexpected weight change.  HENT: Negative for congestion, dental problem, ear pain, nosebleeds, postnasal drip, rhinorrhea, sinus pressure, sneezing, sore throat and trouble swallowing.   Eyes: Negative for redness and itching.  Respiratory: Positive for chest tightness, shortness of breath and wheezing. Negative for cough.   Cardiovascular: Negative for palpitations and leg swelling.  Gastrointestinal: Negative for nausea and vomiting.  Genitourinary: Negative for dysuria.  Musculoskeletal: Negative for joint swelling.  Skin: Negative for rash.  Neurological: Negative for headaches.  Hematological: Does not bruise/bleed easily.  Psychiatric/Behavioral: Negative for dysphoric mood. The patient is not nervous/anxious.       Objective:   Physical Exam  Vital Signs:  Reviewed...  General:  WD, WN,  y/o WM in NAD; alert & oriented; pleasant & cooperative... HEENT:  [place]/AT; Conjunctiva- pink, Sclera- nonicteric, EOM-wnl, PERRLA, EACs-clear, TMs-wnl; NOSE-clear; THROAT-clear & wnl. Neck:  Supple w/ full ROM; no JVD; normal carotid impulses w/o bruits; no thyromegaly or nodules palpated; no lymphadenopathy. Chest:  Clear to P & A; without wheezes, rales, or rhonchi heard. Heart:  Regular Rhythm; norm S1 & S2 without murmurs,  rubs, or gallops detected. Abdomen:  Soft & nontender- no guarding or rebound; normal bowel sounds; no organomegaly or masses palpated. Ext:  Normal ROM; without deformities  or arthritic changes; no varicose veins, venous insuffic, or edema;  Pulses intact w/o bruits. Neuro:  CNs II-XII intact; motor testing normal; sensory testing normal; gait normal & balance OK. Derm:  No lesions noted; no rash etc. Lymph:  No cervical, supraclavicular, axillary, or inguinal adenopathy palpated.     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 Regarding his prev work up & data collected- he did not have OSA but had signif leg jerks w/ arousals & this was never addressed- I rec trial of MIRAPEX 0.25mg  1-2 Qhs;  In addition he is instructed to concentrate on good deep breaths and given an Chiropodist for this purpose;  Finally we discussed the need for a gradual exercise program.   Patient's Medications  New Prescriptions   PRAMIPEXOLE (MIRAPEX) 0.25 MG TABLET    Take 1-2 tablets by mouth at bedtime as directed by physician.  Previous Medications   DABIGATRAN (PRADAXA) 150 MG CAPS CAPSULE    Take 1 capsule (150 mg total) by mouth every 12 (twelve) hours.   FLECAINIDE (TAMBOCOR) 100 MG TABLET    Take 1 tablet (100 mg total) by mouth 2 (two) times daily.    LISINOPRIL (PRINIVIL,ZESTRIL) 20 MG TABLET    10 mg 2 (two) times daily.   NITROGLYCERIN (NITROSTAT) 0.4 MG SL TABLET    Place 1 tablet (0.4 mg total) under the tongue every 5 (five) minutes as needed for chest pain (MAX 3 TABLETS).  Modified Medications   No medications on file  Discontinued Medications   No medications on file

## 2014-10-01 ENCOUNTER — Ambulatory Visit (INDEPENDENT_AMBULATORY_CARE_PROVIDER_SITE_OTHER): Payer: Medicare Other | Admitting: Pulmonary Disease

## 2014-10-01 ENCOUNTER — Encounter: Payer: Self-pay | Admitting: Pulmonary Disease

## 2014-10-01 VITALS — BP 142/76 | HR 57 | Temp 97.1°F | Wt 213.6 lb

## 2014-10-01 DIAGNOSIS — I10 Essential (primary) hypertension: Secondary | ICD-10-CM

## 2014-10-01 DIAGNOSIS — G4761 Periodic limb movement disorder: Secondary | ICD-10-CM

## 2014-10-01 DIAGNOSIS — R0600 Dyspnea, unspecified: Secondary | ICD-10-CM | POA: Diagnosis not present

## 2014-10-01 DIAGNOSIS — I480 Paroxysmal atrial fibrillation: Secondary | ICD-10-CM

## 2014-10-01 DIAGNOSIS — I2510 Atherosclerotic heart disease of native coronary artery without angina pectoris: Secondary | ICD-10-CM

## 2014-10-01 DIAGNOSIS — I2583 Coronary atherosclerosis due to lipid rich plaque: Secondary | ICD-10-CM

## 2014-10-01 NOTE — Progress Notes (Signed)
Subjective:    Patient ID: Erik [name], male    DOB: December 18, 1940, 74 y.o.   MRN: [id number]  HPI 74 y/o WM referred by Dr. Donnie Coffin for pulmonary evaluation>>   ~  October 24, 2010:  Pulm Consult w/ [facility]:  The pt is a 74y/o male who I have been asked to see for dyspnea. He has been having breathing issues for 3-4 yrs, and feels his symptoms have been progressive. The past one year it has been a lot worse. He feels "dizzy" with the sob at times. He describes a less than 2 block doe at a moderate pace on flat ground, and will get winded bringing groceries in from the car. He has the most issues with carrying loads. He denies any cough, congestion, or mucus. He has not had issues with LE edema. He describes the "constant feeling of a softball in center of chest". His most recent cxr last month was ok, and he has had a negative cath last month as well. His echo showed mild LA enlargement and nl LV fxn. He has had pfts in the past, but can't remember when or where. Full PFTs showed> WNL including lung volumes and diffusion...  ~  February 07, 2011:  f/u OV w/ [facility]:  The patient comes in today for followup of persistent dyspnea.  He has had x-rays and pulmonary function studies with no obvious pulmonary abnormalities being found.  He has been evaluated by cardiology, and they have not found a specific etiology for his dyspnea.  He comes in today for further evaluation.  He continues to have significant dyspnea on exertion that is impacting his quality of life, but denies any cough or congestion.  He does admit recurrence of his atrial fibrillation starting yesterday.  Cardiopulmonary stress test showed> an inadeq HR response to exercise (chronotropic incompetence)...  ~  September 01, 2014:  Pulm Consult w/ SN:  Pt referred by DrMitchell for on-going symptom of SOB "I can't utilize all my oxygen" states it's been present for yrs and getting worse he thinks... 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  The pt's hx is rather vague & seems similar to prev complaints documented by DrClance in 2012and by Cariology over the yrs (prev notes by DrClance & Cards- all reviewed)...  EXAM reveals Afeb, VSS, O2sat=98% on RA at rest;  HEENT- neg, Mallampati2;  Chest- clear w/o w/r/r, no incr WOB;  Heart- RR w/o m/r/g;  Ext- neg w/o c/c/e...    (NOTE: I have personally reviewed the XRay & Scan images in Epic, and interpreted the PFTs & other studies below)  Cardiac Nuclear study 09/28/10 showed a small partially reversible apical perfusion defect thought to be ischemic; norm wall mption & EF=72%, he had chest  tightness/ CP but no EKG changes...  Cardiac Cath 09/28/10 showed mild non-obstructive CAD w/ 25% stenosis in LAD & Diagonal, EF=65%  Full PFTs 10/31/10 showed FVC=3.79 (85%), FEV1=2.87 (88%), %1sec=76, mid-flows were wnl at 94% predicted; Lung volumes (Rv & TLC) were wnl; DLCO was wnl as well...   Sleep study 12/09/12> mod snoring but no signif OSA; he did have signif leg jerks w/ 5/hr resulting in arousal (c/w PLM disorder or RLS)  CPST 02/15/11 showed inadeq HR response to exercise, chronotropic incompetence & he developed ?AV block & junctional rhythm...  CT Chest 06/04/13 (fell on ice) showed fx left 6th & 7th ribs, sm left effusion vs hemothorax, atelectasis, mild cardiomeg, no  adenopathy...  CXR 08/07/14 showed mild cardiomeg, mult old left rib fxs, sl incr markings w/o acute changes, small HH, multilevelo DDD...  Spirometry 09/01/14 showed FVC=2.84 (62%), FEV1=2.33 (67%), %1sec=82, mid-flows were wnl at 80% predicted; c/w mod restriction but no obstructive dis demonstrated...  Ambulatory Oxygen saturation test> O2sat=96% on RA at rest w/ pulse=52/min;  He walked 3 laps w/ lowest O2 sat=91% w/ pulse=84/min...   IMP/PLAN>>  There does not appear to have been any recent developments to explain DrMitchell's concern as to why the pt suddenly started c/o dyspnea again; by description his SOB seems similar to that described in 2012; his symptoms have surely been multifactorial in etiology w/ cardiac issues as the primary concern given his neg CXR & prev normal PFTs & norm oxygen saturations; now his spirometry appears sl restricted, he has known chronotropic incompetence, PAF, hx Aflutter etc; he is also quite anxious w/ a number of family issues... Regarding his prev work up & data collected- he did not have OSA but had signif leg jerks w/ arousals & this was never addressed- I rec trial of MIRAPEX 0.25mg  1-2 Qhs;  In addition he is instructed to concentrate on good deep breaths and given an Chiropodist for this purpose;  Finally we discussed the need for a gradual exercise program...  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    Past Medical  History  Diagnosis Date   Followed by DrTaylor- last seen 12/15 for HBP, PAF, Hx AFlutter s/p ablation, and non-compliance (he stopped meds w/ breakthrough symptoms, using Etoh) & placed back on Lisinopril20, Flecainide100Bid, Pradaxa150Bid...   . Atrial flutter >>      s/p RFCA  by GT  . Dyslipidemia   . Hypertension >>   . Atrial fibrillation >>     Paroxysmal, s/p PVI 11/11/10  . Basal cell carcinoma of ear   . GERD (gastroesophageal reflux disease)   . CAD (coronary artery disease) >>     a. cath 5/12: pLAD 25%, D1 25%, EF 65 % (done 2/2 abnormal myoview with inferapical  ischemia)  . TIA (transient ischemic attack)     11/12/10 post afib ablation  . Snoring  a. sleep study 11/2012 neg for OSA; increased leg movement suspicious for primary movement d/o (ie restless leg syndrome)   . Hx of echocardiogram     Echo (9/15):  Mild LVH, EF 55-60%, no RWMA, normal diast function, mild MR, mod LAE, mildly increased pulmonary pressures (PASP 30 mmHg).   Past Surgical History  Procedure Laterality Date  . Knee arthroscopy      bilateral  . Atrial flutter ablation      by GT  . Basal cell cancer resection    . Hemorrhoid surgery    . Eye surgery    . Tee without cardioversion  10/31/2011    Procedure: TRANSESOPHAGEAL ECHOCARDIOGRAM (TEE);  Surgeon: Jolaine Artist, MD;  Location: [facility] ENDOSCOPY;  Service: Cardiovascular;  Laterality: N/A;  . Cardioversion  10/31/2011    Procedure: CARDIOVERSION;  Surgeon: Jolaine Artist, MD;  Location: [facility] ENDOSCOPY;  Service: Cardiovascular;  Laterality: N/A;    Outpatient Encounter Prescriptions as of 10/01/2014  Medication Sig  . dabigatran (PRADAXA) 150 MG CAPS capsule Take 1 capsule (150 mg total) by mouth every 12 (twelve) hours.  . flecainide (TAMBOCOR) 100 MG tablet Take 1 tablet (100 mg total) by mouth 2 (two) times daily.  Marland Kitchen lisinopril (PRINIVIL,ZESTRIL) 20 MG tablet 10 mg 2 (two) times daily.  . nitroGLYCERIN (NITROSTAT) 0.4 MG SL tablet  Place 1 tablet (0.4 mg total) under the tongue every 5 (five) minutes as needed for chest pain (MAX 3 TABLETS).  . pramipexole (MIRAPEX) 0.25 MG tablet Take 1-2 tablets by mouth at bedtime as directed by physician.    No Known Allergies   Current Medications, Allergies, Past Medical History, Past Surgical History, Family History, and Social History were reviewed in [employer] record.   Review of Systems  Constitutional: Negative for fever and unexpected weight change.  HENT: Negative for congestion, dental problem, ear pain, nosebleeds, postnasal drip, rhinorrhea, sinus pressure, sneezing, sore throat and trouble swallowing.   Eyes: Negative for redness and itching.  Respiratory: Positive for chest tightness, shortness of breath and wheezing. Negative for cough.   Cardiovascular: Negative for palpitations and leg swelling.  Gastrointestinal: Negative for nausea and vomiting.  Genitourinary: Negative for dysuria.  Musculoskeletal: Negative for joint swelling.  Skin: Negative for rash.  Neurological: Negative for headaches.  Hematological: Does not bruise/bleed easily.  Psychiatric/Behavioral: Negative for dysphoric mood. The patient is not nervous/anxious.       Objective:   Physical Exam  Vital Signs:  Reviewed...  General:  WD, WN, 74 y/o WM in NAD; alert & oriented; pleasant & cooperative... HEENT:  [place]/AT; Conjunctiva- pink, Sclera- nonicteric, EOM-wnl, PERRLA, EACs-clear, TMs-wnl; NOSE-clear; THROAT-clear & wnl. Neck:  Supple w/ full ROM; no JVD; normal carotid impulses w/o bruits; no thyromegaly or nodules palpated; no lymphadenopathy. Chest:  Clear to P & A; without wheezes, rales, or rhonchi heard. Heart:  Regular Rhythm; norm S1 & S2 without murmurs, rubs, or gallops detected. Abdomen:  Soft & nontender- no guarding or rebound; normal bowel sounds; no organomegaly or masses palpated. Ext:  Normal ROM; without deformities or arthritic changes; no  varicose veins, venous insuffic, or edema;  Pulses intact w/o bruits. Neuro:  CNs II-XII intact; motor testing normal; sensory testing normal; gait normal & balance OK. Derm:  No lesions noted; no rash etc. Lymph:  No cervical, supraclavicular, axillary, or inguinal adenopathy palpated.     Assessment & Plan:    IMP >>  Dyspnea> multifactorial R/O pulmonary restriction PLMD w/ sleep  disruption Cardiac arrhythmias w/ chronotropic incompetence Non-obstructive CAD HBP   PLAN >>  He refused the Mirapex but says his breathing is better w/ exercise at home; he declines meds for the PLMD and will continue exercise at home;  Asked to call for any interval problems, otherw we plan ROV in 3 mo & consider recheck PFT at that time...   Patient's Medications  New Prescriptions   No medications on file  Previous Medications   DABIGATRAN (PRADAXA) 150 MG CAPS CAPSULE    Take 1 capsule (150 mg total) by mouth every 12 (twelve) hours.   FLECAINIDE (TAMBOCOR) 100 MG TABLET    Take 1 tablet (100 mg total) by mouth 2 (two) times daily.   LISINOPRIL (PRINIVIL,ZESTRIL) 20 MG TABLET    10 mg 2 (two) times daily.   NITROGLYCERIN (NITROSTAT) 0.4 MG SL TABLET    Place 1 tablet (0.4 mg total) under the tongue every 5 (five) minutes as needed for chest pain (MAX 3 TABLETS).  Modified Medications   No medications on file  Discontinued Medications   PRAMIPEXOLE (MIRAPEX) 0.25 MG TABLET    Take 1-2 tablets by mouth at bedtime as directed by physician.

## 2014-10-01 NOTE — Patient Instructions (Signed)
Today we updated your med list in our EPIC system...    Continue your current medications the same...  We removed the Mirapex from your med...    If you want to consider a diff med for your leg movement disorder (it disrupts your deep sllep stage)- please let me know...  Keep up the great job w/ your exercise program!!!  Call for any questions...  Let's plan a follow up visit in 34months.Marland KitchenMarland Kitchen

## 2014-12-13 ENCOUNTER — Other Ambulatory Visit: Payer: Self-pay | Admitting: Internal Medicine

## 2014-12-14 ENCOUNTER — Other Ambulatory Visit: Payer: Self-pay

## 2014-12-14 MED ORDER — LISINOPRIL 20 MG PO TABS: 10.0000 mg | ORAL_TABLET | Freq: Two times a day (BID) | ORAL | Status: DC

## 2014-12-30 ENCOUNTER — Telehealth: Payer: Self-pay | Admitting: Internal Medicine

## 2014-12-30 NOTE — Telephone Encounter (Signed)
New message     Request for surgical clearance:  1. What type of surgery is being performed?  Eyelid surgery  2. When is this surgery scheduled? 01-04-15  Are there any medications that need to be held prior to surgery and how long? prodaxa-----can it be held if so how long? 3. Name of physician performing surgery? Dr Lorina Rabon  4. What is your office phone and fax number? Fax [phone]

## 2014-12-30 NOTE — Telephone Encounter (Signed)
Pt has a CrCl of 97 mL/min.  He has a CHADS score of 3, including a history of TIA after an aflutter ablation.  Of note, he has stopped his medications on his own for an extended period of time and had no further issues.  If holding anticoagulation is required, would hold Pradaxa for 1 days based on renal function.  Will fax to Dr. Linton Rump office.

## 2015-01-04 ENCOUNTER — Ambulatory Visit: Payer: Medicare Other | Admitting: Pulmonary Disease

## 2015-01-07 ENCOUNTER — Other Ambulatory Visit: Payer: Self-pay | Admitting: Internal Medicine

## 2015-01-07 ENCOUNTER — Other Ambulatory Visit: Payer: Self-pay | Admitting: *Deleted

## 2015-01-07 MED ORDER — FLECAINIDE ACETATE 100 MG PO TABS
100.0000 mg | ORAL_TABLET | Freq: Two times a day (BID) | ORAL | Status: DC
Start: 2015-01-07 — End: 2015-02-16

## 2015-01-07 MED ORDER — DABIGATRAN ETEXILATE MESYLATE 150 MG PO CAPS: 150.0000 mg | ORAL_CAPSULE | Freq: Two times a day (BID) | ORAL | Status: DC

## 2015-01-12 ENCOUNTER — Telehealth: Payer: Self-pay | Admitting: Internal Medicine

## 2015-01-12 MED ORDER — LISINOPRIL 20 MG PO TABS: 10.0000 mg | ORAL_TABLET | Freq: Two times a day (BID) | ORAL | Status: DC

## 2015-01-12 NOTE — Telephone Encounter (Signed)
New message     Pt called wanting to know why he only got 15 lisinopril pills.  Per refill nurse, pt is overdue for a follow up.  Patient states he is leaving Friday am for [place] and will be gone until sept 20th or so.  Refill nurse could not extend medication. Please call pt and let him know if he can get more rx

## 2015-01-12 NOTE — Telephone Encounter (Signed)
Spoke with patient and let him know I would refill his medication for him today.  Erik [name] is going to call him and schedule his follow up appointment. He says he never received a reminder letter for appointment

## 2015-01-13 ENCOUNTER — Ambulatory Visit (INDEPENDENT_AMBULATORY_CARE_PROVIDER_SITE_OTHER)
Admission: RE | Admit: 2015-01-13 | Discharge: 2015-01-13 | Disposition: A | Discharge status: Home / Self Care | Payer: Medicare Other | Source: Ambulatory Visit | Attending: Pulmonary Disease | Admitting: Pulmonary Disease

## 2015-01-13 ENCOUNTER — Encounter: Payer: Self-pay | Admitting: Pulmonary Disease

## 2015-01-13 ENCOUNTER — Ambulatory Visit (INDEPENDENT_AMBULATORY_CARE_PROVIDER_SITE_OTHER): Payer: Medicare Other | Admitting: Pulmonary Disease

## 2015-01-13 VITALS — BP 116/84 | HR 52 | Temp 98.3°F | Wt 213.4 lb

## 2015-01-13 DIAGNOSIS — I2510 Atherosclerotic heart disease of native coronary artery without angina pectoris: Secondary | ICD-10-CM | POA: Diagnosis not present

## 2015-01-13 DIAGNOSIS — I2583 Coronary atherosclerosis due to lipid rich plaque: Secondary | ICD-10-CM

## 2015-01-13 DIAGNOSIS — R0600 Dyspnea, unspecified: Secondary | ICD-10-CM

## 2015-01-13 DIAGNOSIS — I10 Essential (primary) hypertension: Secondary | ICD-10-CM

## 2015-01-13 DIAGNOSIS — G4761 Periodic limb movement disorder: Secondary | ICD-10-CM | POA: Diagnosis not present

## 2015-01-13 DIAGNOSIS — I480 Paroxysmal atrial fibrillation: Secondary | ICD-10-CM

## 2015-01-13 DIAGNOSIS — I4589 Other specified conduction disorders: Secondary | ICD-10-CM

## 2015-01-13 DIAGNOSIS — G459 Transient cerebral ischemic attack, unspecified: Secondary | ICD-10-CM

## 2015-01-13 NOTE — Patient Instructions (Signed)
Today we updated your med list in our EPIC system...    Continue your current medications the same...  Today we rechecked your pulmonary function test and your CXR...    We will contact you w/ the results when available...   Keep up the good work w/ your progressive exercise program!!!  Good luck w/ the planned trip to [place]- be careful & take your time, don't push too hard...  Call for any questions...  Let's plan a follow up visit in 18mo, sooner if needed for problems.Marland KitchenMarland Kitchen

## 2015-01-13 NOTE — Progress Notes (Signed)
Subjective:    Patient ID: Erik [name], male    DOB: 07/07/1940, 74 y.o.   MRN: [id number]  HPI 74 y/o WM referred by Dr. Donnie Coffin for pulmonary evaluation>>   ~  October 24, 2010:  Pulm Consult w/ [facility]:  The pt is a 74y/o male who I have been asked to see for dyspnea. He has been having breathing issues for 3-4 yrs, and feels his symptoms have been progressive. The past one year it has been a lot worse. He feels "dizzy" with the sob at times. He describes a less than 2 block doe at a moderate pace on flat ground, and will get winded bringing groceries in from the car. He has the most issues with carrying loads. He denies any cough, congestion, or mucus. He has not had issues with LE edema. He describes the "constant feeling of a softball in center of chest". His most recent cxr last month was ok, and he has had a negative cath last month as well. His echo showed mild LA enlargement and nl LV fxn. He has had pfts in the past, but can't remember when or where. Full PFTs showed> WNL including lung volumes and diffusion...  ~  February 07, 2011:  f/u OV w/ [facility]:  The patient comes in today for followup of persistent dyspnea.  He has had x-rays and pulmonary function studies with no obvious pulmonary abnormalities being found.  He has been evaluated by cardiology, and they have not found a specific etiology for his dyspnea.  He comes in today for further evaluation.  He continues to have significant dyspnea on exertion that is impacting his quality of life, but denies any cough or congestion.  He does admit recurrence of his atrial fibrillation starting yesterday.  Cardiopulmonary stress test showed> an inadeq HR response to exercise (chronotropic incompetence)...  ~  September 01, 2014:  Pulm Consult w/ SN:  Pt referred by DrMitchell for on-going symptom of SOB "I can't utilize all my oxygen" states it's been present for yrs and getting worse he thinks... 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  The pt's hx is rather vague & seems similar to prev complaints documented by DrClance in 2012and by Cariology over the yrs (prev notes by DrClance & Cards- all reviewed)...  EXAM reveals Afeb, VSS, O2sat=98% on RA at rest;  HEENT- neg, Mallampati2;  Chest- clear w/o w/r/r, no incr WOB;  Heart- RR w/o m/r/g;  Ext- neg w/o c/c/e...    (NOTE: I have personally reviewed the XRay & Scan images in Epic, and interpreted the PFTs & other studies below)  Cardiac Nuclear study 09/28/10 showed a small partially reversible apical perfusion defect thought to be ischemic; norm wall mption & EF=72%, he had chest  tightness/ CP but no EKG changes...  Cardiac Cath 09/28/10 showed mild non-obstructive CAD w/ 25% stenosis in LAD & Diagonal, EF=65%  Full PFTs 10/31/10 showed FVC=3.79 (85%), FEV1=2.87 (88%), %1sec=76, mid-flows were wnl at 94% predicted; Lung volumes (Rv & TLC) were wnl; DLCO was wnl as well...   Sleep study 12/09/12> mod snoring but no signif OSA; he did have signif leg jerks w/ 5/hr resulting in arousal (c/w PLM disorder or RLS)  CPST 02/15/11 showed inadeq HR response to exercise, chronotropic incompetence & he developed ?AV block & junctional rhythm...  CT Chest 06/04/13 (fell on ice) showed fx left 6th & 7th ribs, sm left effusion vs hemothorax, atelectasis, mild cardiomeg, no  adenopathy...  CXR 08/07/14 showed mild cardiomeg, mult old left rib fxs, sl incr markings w/o acute changes, small HH, multilevelo DDD...  Spirometry 09/01/14 showed FVC=2.84 (62%), FEV1=2.33 (67%), %1sec=82, mid-flows were wnl at 80% predicted; c/w mod restriction but no obstructive dis demonstrated...  Ambulatory Oxygen saturation test 09/01/14> O2sat=96% on RA at rest w/ pulse=52/min;  He walked 3 laps w/ lowest O2 sat=91% w/ pulse=84/min...   IMP/PLAN>>  There does not appear to have been any recent developments to explain DrMitchell's concern as to why the pt suddenly started c/o dyspnea again; by description his SOB seems similar to that described in 2012; his symptoms have surely been multifactorial in etiology w/ cardiac issues as the primary concern given his neg CXR & prev normal PFTs & norm oxygen saturations; now his spirometry appears sl restricted, he has known chronotropic incompetence, PAF, hx Aflutter etc; he is also quite anxious w/ a number of family issues... Regarding his prev work up & data collected- he did not have OSA but had signif leg jerks w/ arousals & this was never addressed- I rec trial of MIRAPEX 0.25mg  1-2 Qhs;  In addition he is instructed to concentrate on good deep breaths and given an Chiropodist for this purpose;  Finally we discussed the need for a gradual exercise program...  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  ~   January 13, 2015:  49mo ROV w/ SN >> Mohd returns for f/u appt prior to planned hunting trip to [place];  He tells me that he has been walking- started slowly & gradually built up & improved he says; he's been using an incline on his treadmill, and hiking using a backpack full on rocks?!?  He has been using his incentive spirometer & "gets it all the way up" he says;  He is vague about his stamina, how he is doing w/ his exercise program, etc;  He recently saw Ortho & reports that he was given shots in his knees and shoulder;  He also recently had right eyelid surg by DrZaldivar...     Dyspnea is multifactorial> CPST 9/12 showed chronotropic incompetence, mild restriction on spirometry after he fell 7 fx  several left ribs; too sedentary & asked to incr exercise program...    Restrictive lung disease by Spirometry ?etiology> NOTE- Full PFT^s were wnl 10/2010; mild restriction 08/2014 on Spirometry; CTChest 06/10/13 (fell on ice) showed fx left 6th & 7th ribs, sm left effusion vs hemothorax, atelectasis, mild cardiomeg, no adenopathy...    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    Hx fall on ice 05/2013 w/ several fx left ribs> aware    CARDS> followed by DrTaylor for HBP, PAF, HxAFlutter- s/p ablation, hx non-compliance w/ meds; on Pradaxa150Bid, Flecainide100Bid, Lisinopril20-1/2Bid EXAM showed Afeb, VSS, O2sat=99% on RA at rest;  HEENT- neg, Mallampati2;  Chest- clear w/o w/r/r, no incr WOB;  Heart- RR w/o m/r/g;  Ext- neg w/o c/c/e...   CXR 01/13/15 showed mild cardiomegaly, essentially clear lungs w/ several left rib fxs, DDD in Tspine, NAD...   Spirometry 01/13/15> FVC=2.95 (64%), FEV1=2.40 (69%), %1sec=81, mid-flows wnl at 88% predicted...   IMP/PLAN>>  Errin is asked to be cautious w/ his planned hunting trip- he has had dyspnea on his treadmill &  walking around town, things will NOT be easier in [place]; no obstructive lung dis 7 no indication for inhalers, other meds; sked to continue his exercise program, use of IS, etc;  He needs to continue regular follow up w/ CARDS (he is currently overdue for his 28mo check up)...    Past Medical History  Diagnosis Date   Followed by DrTaylor- last seen 12/15 for HBP, PAF, Hx AFlutter s/p ablation, and non-compliance (he stopped meds w/ breakthrough symptoms, using Etoh) & placed back on Lisinopril20, Flecainide100Bid, Pradaxa150Bid...   . Atrial flutter >>      s/p RFCA  by GT  . Dyslipidemia   . Hypertension >>   . Atrial fibrillation >>     Paroxysmal, s/p PVI 11/11/10  . Basal cell carcinoma of ear   . GERD (gastroesophageal reflux disease)   . CAD (coronary artery disease) >>     a. cath 5/12: pLAD 25%, D1 25%, EF 65 % (done 2/2 abnormal myoview with inferapical  ischemia)  . TIA (transient ischemic attack)     11/12/10 post afib ablation  . Snoring     a. sleep study 11/2012 neg for OSA; increased leg movement suspicious for primary movement d/o (ie restless leg syndrome)   . Hx of echocardiogram     Echo (9/15):  Mild LVH, EF 55-60%, no RWMA, normal diast function, mild MR, mod LAE, mildly increased pulmonary pressures (PASP 30 mmHg).   Past Surgical History  Procedure Laterality Date  . Knee arthroscopy      bilateral  . Atrial flutter ablation      by GT  . Basal cell cancer resection    . Hemorrhoid surgery    . Eye surgery    . Tee without cardioversion  10/31/2011    Procedure: TRANSESOPHAGEAL ECHOCARDIOGRAM (TEE);  Surgeon: Jolaine Artist, MD;  Location: [facility] ENDOSCOPY;  Service: Cardiovascular;  Laterality: N/A;  . Cardioversion  10/31/2011    Procedure: CARDIOVERSION;  Surgeon: Jolaine Artist, MD;  Location: [facility] ENDOSCOPY;  Service: Cardiovascular;  Laterality: N/A;    Outpatient Encounter Prescriptions as of 01/13/2015  Medication Sig  . dabigatran (PRADAXA) 150  MG CAPS capsule Take 1 capsule (150 mg total) by mouth every 12 (  twelve) hours.  . flecainide (TAMBOCOR) 100 MG tablet Take 1 tablet (100 mg total) by mouth 2 (two) times daily.  Marland Kitchen lisinopril (PRINIVIL,ZESTRIL) 20 MG tablet Take 0.5 tablets (10 mg total) by mouth 2 (two) times daily.  . nitroGLYCERIN (NITROSTAT) 0.4 MG SL tablet Place 1 tablet (0.4 mg total) under the tongue every 5 (five) minutes as needed for chest pain (MAX 3 TABLETS).   Facility-Administered Encounter Medications as of 01/13/2015  Medication  . gadobenate dimeglumine (MULTIHANCE) injection 20 mL    No Known Allergies   Current Medications, Allergies, Past Medical History, Past Surgical History, Family History, and Social History were reviewed in [employer] record.   Review of Systems  Constitutional: Negative for fever and unexpected weight change.  HENT: Negative for congestion, dental problem, ear pain, nosebleeds, postnasal drip, rhinorrhea, sinus pressure, sneezing, sore throat and trouble swallowing.   Eyes: Negative for redness and itching.  Respiratory: Positive for chest tightness, shortness of breath and wheezing. Negative for cough.   Cardiovascular: Negative for palpitations and leg swelling.  Gastrointestinal: Negative for nausea and vomiting.  Genitourinary: Negative for dysuria.  Musculoskeletal: Negative for joint swelling.  Skin: Negative for rash.  Neurological: Negative for headaches.  Hematological: Does not bruise/bleed easily.  Psychiatric/Behavioral: Negative for dysphoric mood. The patient is not nervous/anxious.       Objective:   Physical Exam  Vital Signs:  Reviewed...  General:  WD, WN, 74 y/o WM in NAD; alert & oriented; pleasant & cooperative... HEENT:  [place]/AT; Conjunctiva- pink, Sclera- nonicteric, EOM-wnl, PERRLA, EACs-clear, TMs-wnl; NOSE-clear; THROAT-clear & wnl. Neck:  Supple w/ full ROM; no JVD; normal carotid impulses w/o bruits; no thyromegaly or  nodules palpated; no lymphadenopathy. Chest:  Clear to P & A; without wheezes, rales, or rhonchi heard. Heart:  Regular Rhythm; norm S1 & S2 without murmurs, rubs, or gallops detected. Abdomen:  Soft & nontender- no guarding or rebound; normal bowel sounds; no organomegaly or masses palpated. Ext:  Normal ROM; without deformities or arthritic changes; no varicose veins, venous insuffic, or edema;  Pulses intact w/o bruits. Neuro:  CNs II-XII intact; motor testing normal; sensory testing normal; gait normal & balance OK. Derm:  No lesions noted; no rash etc. Lymph:  No cervical, supraclavicular, axillary, or inguinal adenopathy palpated.     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 8/16> Deshay is asked to be cautious w/ his planned hunting trip- he has had dyspnea on his treadmill & walking around town, things will NOT be easier in [place]; no obstructive lung dis 7 no indication for inhalers, other meds; sked to continue his exercise program, use of IS, etc;  He needs to continue regular follow up w/ CARDS (he is currently overdue for his 49mo check up)   Patient's Medications  New Prescriptions   No medications on file  Previous Medications   DABIGATRAN (PRADAXA) 150 MG CAPS CAPSULE    Take 1 capsule (150 mg total) by mouth every 12 (twelve) hours.   FLECAINIDE (TAMBOCOR) 100 MG TABLET    Take 1  tablet (100 mg total) by mouth 2 (two) times daily.   LISINOPRIL (PRINIVIL,ZESTRIL) 20 MG TABLET    Take 0.5 tablets (10 mg total) by mouth 2 (two) times daily.   NITROGLYCERIN (NITROSTAT) 0.4  MG SL TABLET    Place 1 tablet (0.4 mg total) under the tongue every 5 (five) minutes as needed for chest pain (MAX 3 TABLETS).  Modified Medications   No medications on file  Discontinued Medications   No medications on file

## 2015-02-12 ENCOUNTER — Telehealth: Payer: Self-pay | Admitting: Internal Medicine

## 2015-02-12 MED ORDER — DILTIAZEM HCL 30 MG PO TABS: ORAL_TABLET | ORAL | Status: DC

## 2015-02-12 NOTE — Telephone Encounter (Addendum)
BP 117/78 HR 100.  He woke up with a "fluttering" feeling in his heart.  His normal resting HR is 50.  He has not missed any doses of medications.  He had some old Cardizem but has not taken.  I will call in Cardizem 30 mg ---take 1-2 tablets as needed for fast HR.  He will call back next week if not better and I will have him seen  Aware and agrees with plan

## 2015-02-12 NOTE — Telephone Encounter (Signed)
Pt is out of Rhythm since about 6a, pls advise [phone] has appt 9-27 with Lovena Le

## 2015-02-16 ENCOUNTER — Encounter: Payer: Self-pay | Admitting: Internal Medicine

## 2015-02-16 ENCOUNTER — Ambulatory Visit (INDEPENDENT_AMBULATORY_CARE_PROVIDER_SITE_OTHER): Payer: Medicare Other | Admitting: Internal Medicine

## 2015-02-16 ENCOUNTER — Other Ambulatory Visit: Payer: Self-pay

## 2015-02-16 VITALS — BP 140/72 | HR 55 | Ht 71.0 in | Wt 209.8 lb

## 2015-02-16 DIAGNOSIS — I10 Essential (primary) hypertension: Secondary | ICD-10-CM | POA: Diagnosis not present

## 2015-02-16 DIAGNOSIS — F1010 Alcohol abuse, uncomplicated: Secondary | ICD-10-CM | POA: Diagnosis not present

## 2015-02-16 DIAGNOSIS — I482 Chronic atrial fibrillation, unspecified: Secondary | ICD-10-CM

## 2015-02-16 MED ORDER — FLECAINIDE ACETATE 100 MG PO TABS: 100.0000 mg | ORAL_TABLET | Freq: Two times a day (BID) | ORAL | Status: DC

## 2015-02-16 MED ORDER — DABIGATRAN ETEXILATE MESYLATE 150 MG PO CAPS: 150.0000 mg | ORAL_CAPSULE | Freq: Two times a day (BID) | ORAL | Status: DC

## 2015-02-16 NOTE — Patient Instructions (Signed)
Medication Instructions:  Your physician recommends that you continue on your current medications as directed. Please refer to the Current Medication list given to you today.   Labwork: None ordered   Testing/Procedures: None ordered   Follow-Up: Your physician wants you to follow-up in: 12 months with Dr Knox Saliva will receive a reminder letter in the mail two months in advance. If you don't receive a letter, please call our office to schedule the follow-up appointment.   Any Other Special Instructions Will Be Listed Below (If Applicable).  If heart racing---Start by taking extra Cardizem, if in an hour still racing take the Flecainide

## 2015-02-16 NOTE — Assessment & Plan Note (Signed)
His blood pressure is better today. I suspect it has been high, especially when he consumes ETOH in excess.

## 2015-02-16 NOTE — Assessment & Plan Note (Signed)
He has had some recurrent palpitations. He is a very difficult historian and it is difficult for me to know exactly how much atrial fib he is having. I have asked that he take an extra cardizem and if still out of rhythm, he may take and extra flecainide, up to 3 tabs a day.

## 2015-02-16 NOTE — Assessment & Plan Note (Signed)
He is trying to reduce his consumption but is clearly drinking too much.

## 2015-02-16 NOTE — Progress Notes (Signed)
HPI Erik [name] returns today for followup. He is a pleasant 74 yo man with a h/o HTN, PAF, remote h/o atrial flutter, s/p ablation, h/o atrial fib, s/p ablation, and non-compliance. He went out to [place] and could not walk [place] the [place] 8000 [place]. He has also admitted to using ETOH in excess although he has cut back.  No syncope. He has had recurrent palpitations.   No Known Allergies   Current Outpatient Prescriptions  Medication Sig Dispense Refill  . acetaminophen (TYLENOL) 325 MG tablet Take 650 mg by mouth every 6 (six) hours as needed (pain). Alternates with extra strength Tylenol    . acetaminophen (TYLENOL) 500 MG tablet Take 500 mg by mouth every 6 (six) hours as needed (pain).    . dabigatran (PRADAXA) 150 MG CAPS capsule Take 1 capsule (150 mg total) by mouth every 12 (twelve) hours. 30 capsule 0  . diltiazem (CARDIZEM) 30 MG tablet Take 1-2 tablets by mouth every 6 hours as needed for fast heartrates 60 tablet 1  . flecainide (TAMBOCOR) 100 MG tablet Take 1 tablet (100 mg total) by mouth 2 (two) times daily. 30 tablet 0  . lisinopril (PRINIVIL,ZESTRIL) 20 MG tablet Take 0.5 tablets (10 mg total) by mouth 2 (two) times daily. 45 tablet 1  . LOTEMAX 0.5 % GEL Place 1 drop into the right eye 2 (two) times daily.  0  . nitroGLYCERIN (NITROSTAT) 0.4 MG SL tablet Place 1 tablet (0.4 mg total) under the tongue every 5 (five) minutes as needed for chest pain (MAX 3 TABLETS). 25 tablet 11   No current facility-administered medications for this visit.   Facility-Administered Medications Ordered in Other Visits  Medication Dose Route Frequency Erik [name] Last Rate Last Dose  . gadobenate dimeglumine (MULTIHANCE) injection 20 mL  20 mL Intravenous Once Medication Radiologist, MD         Past Medical History  Diagnosis Date  . Atrial flutter     s/p RFCA  by GT  . Dyslipidemia   . Hypertension   . Atrial fibrillation     Paroxysmal, s/p PVI 11/11/10  . Basal cell  carcinoma of ear   . GERD (gastroesophageal reflux disease)   . CAD (coronary artery disease)     a. cath 5/12: pLAD 25%, D1 25%, EF 65 % (done 2/2 abnormal myoview with inferapical  ischemia)  . TIA (transient ischemic attack)     11/12/10 post afib ablation  . Snoring     a. sleep study 11/2012 neg for OSA; increased leg movement suspicious for primary movement d/o (ie restless leg syndrome)   . Hx of echocardiogram     Echo (9/15):  Mild LVH, EF 55-60%, no RWMA, normal diast function, mild MR, mod LAE, mildly increased pulmonary pressures (PASP 30 mmHg).  Marland Kitchen HTN (hypertension)     ROS:   All systems reviewed and negative except as noted in the HPI.   Past Surgical History  Procedure Laterality Date  . Knee arthroscopy      bilateral  . Atrial flutter ablation      by GT  . Basal cell cancer resection    . Hemorrhoid surgery    . Eye surgery    . Tee without cardioversion  10/31/2011    Procedure: TRANSESOPHAGEAL ECHOCARDIOGRAM (TEE);  Surgeon: Jolaine Artist, MD;  Location: [facility] ENDOSCOPY;  Service: Cardiovascular;  Laterality: N/A;  . Cardioversion  10/31/2011    Procedure: CARDIOVERSION;  Surgeon: Shaune Pascal  Bensimhon, MD;  Location: [facility] ENDOSCOPY;  Service: Cardiovascular;  Laterality: N/A;     Family History  Problem Relation Age of Onset  . Hypertension Mother   . Stroke Mother   . Heart disease Mother   . Arthritis Mother   . Heart attack Neg Hx      Social History   Social History  . Marital Status: Divorced    Spouse Name: N/A  . Number of Children: 1  . Years of Education: N/A   Occupational History  . 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 Smoking status: Former Smoker -- 1.50 packs/day for 20 years    Types: Cigarettes    Quit date: 05/23/1975  . Smokeless tobacco: Never Used  . Alcohol Use: 7.0 oz/week    14 drink(s) per week     Comment: 2 drinks per night  . Drug Use: No  . Sexual Activity: Not on  file   Other Topics Concern  . Not on file   Social History Narrative     BP 140/72 mmHg  Pulse 55  Ht 5\' 11"  (1.803 m)  Wt 209 lb 12.8 oz (95.165 kg)  BMI 29.27 kg/m2  Physical Exam:  Well appearing 74 year old man, NAD HEENT: Unremarkable Neck:  6 cm JVD, no thyromegally Lymphatics:  No adenopathy Back:  No CVA tenderness Lungs:  Clear, with no wheezes, rales, or rhonchi. HEART:  Regular rate rhythm, no murmurs, no rubs, no clicks Abd:  soft, positive bowel sounds, no organomegally, no rebound, no guarding Ext:  2 plus pulses, no edema, no cyanosis, no clubbing Skin:  No rashes no nodules Neuro:  CN II through XII intact, motor grossly intact  EKG - normal sinus rhythm with sinus bradycardia and left axis deviation.   Assess/Plan:

## 2015-02-18 ENCOUNTER — Telehealth: Payer: Self-pay | Admitting: Internal Medicine

## 2015-02-18 NOTE — Telephone Encounter (Signed)
Spoke with patient's wife and let her know to take his morning medications as usual.  Also advised he needs to decrease his ETOH intake as this has a lot to do with the problem.  She says she has told him this also

## 2015-02-18 NOTE — Telephone Encounter (Signed)
New message     Heart went out of rhythm last night.  He did not take his medication last night before going to bed.  At 4am he took his flecainide, at 6:30 he took his diltiazem.  Now that he is off schedule, can he take his morning medication?

## 2015-02-20 ENCOUNTER — Telehealth: Payer: Self-pay | Admitting: Cardiology

## 2015-02-20 NOTE — Telephone Encounter (Signed)
Pt in a fib, has taken one po dilt 30 mg, I have asked her to have him to take in an hour another 30 mg dilt.  His HR is 108.  Reassured that as long as the HR is less than 110 and he has no SOB or chest pain he should be fine.  Though if symptoms occur to call us.  Family was agreeable.

## 2015-02-21 ENCOUNTER — Encounter (HOSPITAL_COMMUNITY): Payer: Self-pay | Admitting: Emergency Medicine

## 2015-02-21 ENCOUNTER — Emergency Department (HOSPITAL_COMMUNITY)
Admission: EM | Admit: 2015-02-21 | Discharge: 2015-02-21 | Disposition: A | Discharge status: Home / Self Care | Payer: Medicare Other | Attending: Emergency Medicine | Admitting: Emergency Medicine

## 2015-02-21 DIAGNOSIS — Z79899 Other long term (current) drug therapy: Secondary | ICD-10-CM | POA: Diagnosis not present

## 2015-02-21 DIAGNOSIS — Z87891 Personal history of nicotine dependence: Secondary | ICD-10-CM | POA: Insufficient documentation

## 2015-02-21 DIAGNOSIS — R002 Palpitations: Secondary | ICD-10-CM | POA: Diagnosis present

## 2015-02-21 DIAGNOSIS — Z8639 Personal history of other endocrine, nutritional and metabolic disease: Secondary | ICD-10-CM | POA: Diagnosis not present

## 2015-02-21 DIAGNOSIS — Z85828 Personal history of other malignant neoplasm of skin: Secondary | ICD-10-CM | POA: Diagnosis not present

## 2015-02-21 DIAGNOSIS — I10 Essential (primary) hypertension: Secondary | ICD-10-CM | POA: Insufficient documentation

## 2015-02-21 DIAGNOSIS — Z8673 Personal history of transient ischemic attack (TIA), and cerebral infarction without residual deficits: Secondary | ICD-10-CM | POA: Diagnosis not present

## 2015-02-21 DIAGNOSIS — I480 Paroxysmal atrial fibrillation: Secondary | ICD-10-CM | POA: Diagnosis not present

## 2015-02-21 DIAGNOSIS — G478 Other sleep disorders: Secondary | ICD-10-CM | POA: Insufficient documentation

## 2015-02-21 DIAGNOSIS — Z8719 Personal history of other diseases of the digestive system: Secondary | ICD-10-CM | POA: Insufficient documentation

## 2015-02-21 DIAGNOSIS — I2510 Atherosclerotic heart disease of native coronary artery without angina pectoris: Secondary | ICD-10-CM | POA: Insufficient documentation

## 2015-02-21 LAB — CBC
HEMATOCRIT: 46.9 % (ref 39.0–52.0)
HEMOGLOBIN: 16 g/dL (ref 13.0–17.0)
MCH: 33.1 pg (ref 26.0–34.0)
MCHC: 34.1 g/dL (ref 30.0–36.0)
MCV: 96.9 fL (ref 78.0–100.0)
Platelets: 255 10*3/uL (ref 150–400)
RBC: 4.84 MIL/uL (ref 4.22–5.81)
RDW: 12.7 % (ref 11.5–15.5)
WBC: 7.9 10*3/uL (ref 4.0–10.5)

## 2015-02-21 LAB — BASIC METABOLIC PANEL
ANION GAP: 7 (ref 5–15)
BUN: 21 mg/dL — ABNORMAL HIGH (ref 6–20)
CO2: 27 mmol/L (ref 22–32)
Calcium: 9.6 mg/dL (ref 8.9–10.3)
Chloride: 104 mmol/L (ref 101–111)
Creatinine, Ser: 1.36 mg/dL — ABNORMAL HIGH (ref 0.61–1.24)
GFR calc Af Amer: 58 mL/min — ABNORMAL LOW (ref >60)
GFR, EST NON AFRICAN AMERICAN: 50 mL/min — AB (ref >60)
GLUCOSE: 114 mg/dL — AB (ref 65–99)
Potassium: 5 mmol/L (ref 3.5–5.1)
Sodium: 138 mmol/L (ref 135–145)

## 2015-02-21 MED ORDER — VALACYCLOVIR HCL 500 MG PO TABS: 1000.0000 mg | ORAL_TABLET | Freq: Once | ORAL | Status: DC

## 2015-02-21 MED ORDER — FLECAINIDE ACETATE 50 MG PO TABS
50.0000 mg | ORAL_TABLET | Freq: Once | ORAL | Status: AC
  Administered 2015-02-21: 50 mg via ORAL
  Filled 2015-02-21: qty 1

## 2015-02-21 NOTE — Discharge Instructions (Signed)
Please read and follow all provided instructions.  Your diagnoses today include:  1. Paroxysmal atrial fibrillation (HCC)     Tests performed today include:  An EKG of your heart  Blood counts and electrolytes  Vital signs. See below for your results today.   Medications prescribed:   None  Take any prescribed medications only as directed.  Follow-up instructions: Please follow-up with your primary care provider as soon as you can for further evaluation of your symptoms.   Increase your dose of flecinide to 150mg  twice a day.   Return instructions:  SEEK IMMEDIATE MEDICAL ATTENTION IF:  You have an attack of chest pain lasting longer than usual, despite rest and treatment with the medications your caregiver has prescribed.   You wake from sleep with chest pain or shortness of breath.  You feel dizzy or faint.  You have any other emergent concerns regarding your health.  Your vital signs today were: BP 118/85 mmHg   Pulse 88   Resp 15   SpO2 97% If your blood pressure (BP) was elevated above 135/85 this visit, please have this repeated by your doctor within one month. --------------

## 2015-02-21 NOTE — ED Provider Notes (Signed)
CSN: [id number]     Arrival date & time 02/21/15  6073 History   First MD Initiated Contact with Patient 02/21/15 [phone]     Chief Complaint  Patient presents with  . Irregular Heart Beat    (Consider location/radiation/quality/duration/timing/severity/associated sxs/prior Treatment) HPI Comments: Patient with history of paroxysmal atrial fibrillation on Pradaxa, diltiazem, flecanide, previous ablations, heavy EtOH use -- presents c/o palpitations and sensation of racing heart. This has been ongoing for the past 3 days. Patient denies chest pain. He has shortness of breath but is at baseline. He has had some mild lightheadedness with standing but no syncope. No symptoms of infection. Patient called cardiologist yesterday and was told to continue with diltiazem and seek medical attention if heart rate was above 110. Maximum heart rate yesterday was 117. Patient last took two 30 milligram diltiazem tablets at 5:20 AM today. Patient states that the palpitation symptoms are making him feel uncomfortable which is why he decided to come to the hospital. The onset of this condition was acute. The course is constant. Aggravating factors: none. Alleviating factors: none.    The history is provided by the patient and medical records.    Past Medical History  Diagnosis Date  . Atrial flutter ([place])     s/p RFCA  by GT  . Dyslipidemia   . Hypertension   . Atrial fibrillation (HCC)     Paroxysmal, s/p PVI 11/11/10  . Basal cell carcinoma of ear   . GERD (gastroesophageal reflux disease)   . CAD (coronary artery disease)     a. cath 5/12: pLAD 25%, D1 25%, EF 65 % (done 2/2 abnormal myoview with inferapical  ischemia)  . TIA (transient ischemic attack)     11/12/10 post afib ablation  . Snoring     a. sleep study 11/2012 neg for OSA; increased leg movement suspicious for primary movement d/o (ie restless leg syndrome)   . Hx of echocardiogram     Echo (9/15):  Mild LVH, EF 55-60%, no RWMA, normal diast  function, mild MR, mod LAE, mildly increased pulmonary pressures (PASP 30 mmHg).  Marland Kitchen HTN (hypertension)    Past Surgical History  Procedure Laterality Date  . Knee arthroscopy      bilateral  . Atrial flutter ablation      by GT  . Basal cell cancer resection    . Hemorrhoid surgery    . Eye surgery    . Tee without cardioversion  10/31/2011    Procedure: TRANSESOPHAGEAL ECHOCARDIOGRAM (TEE);  Surgeon: Jolaine Artist, MD;  Location: [facility] ENDOSCOPY;  Service: Cardiovascular;  Laterality: N/A;  . Cardioversion  10/31/2011    Procedure: CARDIOVERSION;  Surgeon: Jolaine Artist, MD;  Location: [facility] ENDOSCOPY;  Service: Cardiovascular;  Laterality: N/A;   Family History  Problem Relation Age of Onset  . Hypertension Mother   . Stroke Mother   . Heart disease Mother   . Arthritis Mother   . Heart attack Neg Hx    Social History  Substance Use Topics  . Smoking status: Former Smoker -- 1.50 packs/day for 20 years    Types: Cigarettes    Quit date: 05/23/1975  . Smokeless tobacco: Never Used  . Alcohol Use: 7.0 oz/week    14 drink(s) per week     Comment: 2 drinks per night    Review of Systems  Constitutional: Negative for fever.  HENT: Negative for rhinorrhea and sore throat.   Eyes: Negative for redness.  Respiratory: Positive for shortness  of breath (unchanged from baseline). Negative for cough.   Cardiovascular: Positive for palpitations. Negative for chest pain.  Gastrointestinal: Negative for nausea, vomiting, abdominal pain and diarrhea.  Genitourinary: Negative for dysuria.  Musculoskeletal: Negative for myalgias.  Skin: Negative for rash.  Neurological: Positive for light-headedness. Negative for headaches.  Psychiatric/Behavioral: Positive for sleep disturbance.      Allergies  Review of patient's allergies indicates no known allergies.  Home Medications   Prior to Admission medications   Medication Sig Start Date End Date Taking? Authorizing Provider   acetaminophen (TYLENOL) 325 MG tablet Take 650 mg by mouth every 6 (six) hours as needed (pain). Alternates with extra strength Tylenol    Historical Provider, MD  acetaminophen (TYLENOL) 500 MG tablet Take 500 mg by mouth every 6 (six) hours as needed (pain).    Historical Provider, MD  dabigatran (PRADAXA) 150 MG CAPS capsule Take 1 capsule (150 mg total) by mouth every 12 (twelve) hours. 02/16/15   Evans Lance, MD  diltiazem (CARDIZEM) 30 MG tablet Take 1-2 tablets by mouth every 6 hours as needed for fast heartrates 02/12/15   Evans Lance, MD  flecainide (TAMBOCOR) 100 MG tablet Take 1 tablet (100 mg total) by mouth 2 (two) times daily. 02/16/15   Evans Lance, MD  lisinopril (PRINIVIL,ZESTRIL) 20 MG tablet Take 0.5 tablets (10 mg total) by mouth 2 (two) times daily. 01/12/15   Thompson Grayer, MD  LOTEMAX 0.5 % GEL Place 1 drop into the right eye 2 (two) times daily. 02/15/15   Historical Provider, MD  nitroGLYCERIN (NITROSTAT) 0.4 MG SL tablet Place 1 tablet (0.4 mg total) under the tongue every 5 (five) minutes as needed for chest pain (MAX 3 TABLETS). 05/05/14   Evans Lance, MD   BP 123/79 mmHg  Pulse 98  Resp 20  SpO2 100%   Physical Exam  Constitutional: He appears well-developed and well-nourished.  HENT:  Head: Normocephalic and atraumatic.  Mouth/Throat: Mucous membranes are normal. Mucous membranes are not dry.  Eyes: Conjunctivae are normal. Right eye exhibits no discharge. Left eye exhibits no discharge.  Neck: Trachea normal and normal range of motion. Neck supple. Normal carotid pulses and no JVD present. No muscular tenderness present. Carotid bruit is not present. No tracheal deviation present.  Cardiovascular: Normal rate, S1 normal, S2 normal, normal heart sounds and intact distal pulses.  An irregular rhythm present. Exam reveals no distant heart sounds and no decreased pulses.   No murmur heard. Pulmonary/Chest: Effort normal and breath sounds normal. No  respiratory distress. He has no wheezes. He exhibits no tenderness.  Abdominal: Soft. Normal aorta and bowel sounds are normal. There is no tenderness. There is no rebound and no guarding.  Musculoskeletal: He exhibits no edema.  Neurological: He is alert.  Skin: Skin is warm and dry. He is not diaphoretic. No cyanosis. No pallor.  Psychiatric: He has a normal mood and affect.  Nursing note and vitals reviewed.   ED Course  Procedures (including critical care time) Labs Review Labs Reviewed  BASIC METABOLIC PANEL - Abnormal; Notable for the following:    Glucose, Bld 114 (*)    BUN 21 (*)    Creatinine, Ser 1.36 (*)    GFR calc non Af Amer 50 (*)    GFR calc Af Amer 58 (*)    All other components within normal limits  CBC    Imaging Review No results found. I have personally reviewed and evaluated these images and  lab results as part of my medical decision-making.   EKG Interpretation   Date/Time:  Sunday February 21 2015 08:57:13 EDT Ventricular Rate:  106 PR Interval:    QRS Duration: 110 QT Interval:  362 QTC Calculation: 480 R Axis:   -59 Text Interpretation:  recurrent  Atrial flutter with variable A-V block  Left axis deviation Inferior infarct , age undetermined Confirmed by  Maryan Rued  MD, WHITNEY ([id number]) on 02/21/2015 9:58:21 AM       9:24 AM Patient seen and examined. Work-up initiated. HR stays between 90-110.   Vital signs reviewed and are as follows: BP 123/79 mmHg  Pulse 98  Resp 20  SpO2 100%  11:55 AM Labs reviewed. Patient discussed with Dr. Maryan Rued. Spoke with Dr. Haroldine Laws. Reccs: Inc flecanide to 150mg  bid. Offer cardioversion here if patient has not missed any doses of his anticoagulation.  I gave patient and wife these options. He would prefer to try increasing the medication and following up with Dr. Lovena Le this week. He declines cardioversion here. Additional 50mg  ordered here to get patient up to 150mg  and will start 150mg  tonight. Dr.  Haroldine Laws will make Dr. Lovena Le aware that patient was here.   Patient urged to return with worsening symptoms or other concerns. Encouraged to return with elevated heart rate, worsening symptoms, chest pain or shortness of breath. Patient/wife verbalized understanding and agrees with plan.    MDM   Final diagnoses:  Paroxysmal atrial fibrillation ([place])   Patient with symptomatic but rate controlled paroxysmal A. fib. Cardiology consult and treatment as above. Patient does not wish to be cardioverted today. Discharge to home with increased work and I dose and plans for follow-up this coming week. Return instructions as above.    Carlisle Cater, PA-C 02/21/15 [place], MD 02/21/15 [id number]

## 2015-02-21 NOTE — ED Notes (Signed)
Dysrhythmia since last weekend; seen at PCP on Tuesday and started back on diltiazem. Palpitations have continued. Did not sleep well last night; took diltiazem at 0520. Denies a change in events today, but states he is tired of feeling this way, so came to ED for help. Denies CP, N/V, dizziness, or symptoms at this time.

## 2015-02-21 NOTE — ED Notes (Signed)
Patient used urinal and sample is on bedside table; visitor at bedside

## 2015-02-21 NOTE — ED Notes (Signed)
Patient given an urinal to use; visitor standing outside of door for patient privacy

## 2015-02-22 ENCOUNTER — Encounter: Payer: Self-pay | Admitting: Internal Medicine

## 2015-02-22 ENCOUNTER — Other Ambulatory Visit: Payer: Self-pay

## 2015-02-22 ENCOUNTER — Telehealth: Payer: Self-pay | Admitting: Internal Medicine

## 2015-02-22 NOTE — Telephone Encounter (Signed)
Erik [name] at 02/22/2015 10:34 AM     Status: Signed       Expand All Collapse All   New message      Pt was seen at ER yesterday. His medication was changed. Pt seemed confused this am. Wife want to talk to nurse to see if it was his medication making him confused. Please advise       Will add on to see Dr Lovena Le tomorrow at 2:00pm

## 2015-02-22 NOTE — Telephone Encounter (Signed)
This encounter was created in error - please disregard.

## 2015-02-22 NOTE — Telephone Encounter (Addendum)
Per ER:   Pt with hx of PAF on flecainide who presents with afib for the last 4 days. Pt missed a dose of anti-arrhythmic and now not converting to SR. Pt does nto tolerate a.fib well. However rate controlled at this time and labs without acute findings. Pt given option to cardiovert vs increase flecanide per Dr. Enid Derry and pt chose to increase meds and f/u with Dr. Lovena Le.  Will discuss with Dr Lovena Le and call patient back tomorrow

## 2015-02-22 NOTE — Telephone Encounter (Signed)
New Message       Pt's wife calling stating that the pt's heart has been out of rhythm since Thursday and they spoke to the on call doctor over the weekend. They were told to call and get an appt w/ Dr. Lovena Le as soon as possible. Dr. Tanna Furry first available appt is in November. Please call back and advise.

## 2015-02-22 NOTE — Telephone Encounter (Signed)
New message      Pt was seen at ER yesterday.  His medication was changed.  Pt seemed confused this am.  Wife want to talk to nurse to see if it was his medication making him confused.  Please advise

## 2015-02-23 ENCOUNTER — Encounter: Payer: Self-pay | Admitting: Internal Medicine

## 2015-02-23 ENCOUNTER — Ambulatory Visit (INDEPENDENT_AMBULATORY_CARE_PROVIDER_SITE_OTHER): Payer: Medicare Other | Admitting: Internal Medicine

## 2015-02-23 VITALS — BP 128/78 | HR 58 | Ht 71.0 in | Wt 211.8 lb

## 2015-02-23 DIAGNOSIS — I482 Chronic atrial fibrillation, unspecified: Secondary | ICD-10-CM

## 2015-02-23 DIAGNOSIS — F1010 Alcohol abuse, uncomplicated: Secondary | ICD-10-CM | POA: Diagnosis not present

## 2015-02-23 DIAGNOSIS — I10 Essential (primary) hypertension: Secondary | ICD-10-CM

## 2015-02-23 DIAGNOSIS — I2510 Atherosclerotic heart disease of native coronary artery without angina pectoris: Secondary | ICD-10-CM | POA: Diagnosis not present

## 2015-02-23 DIAGNOSIS — I2583 Coronary atherosclerosis due to lipid rich plaque: Secondary | ICD-10-CM

## 2015-02-23 NOTE — Patient Instructions (Addendum)
Medication Instructions:  Your physician recommends that you continue on your current medications as directed. Please refer to the Current Medication list given to you today.   Labwork: Your physician recommends that you return for lab work in: 1 week for a BMP/MAG    Testing/Procedures: None ordered   Follow-Up: Your physician recommends that you schedule a follow-up appointment in: 1 week for an EKG  Your physician recommends that you schedule a follow-up appointment in: 3 months with Dr Lovena Le    Any Other Special Instructions Will Be Listed Below (If Applicable).

## 2015-02-24 NOTE — Assessment & Plan Note (Signed)
He denies anginal symptoms. He will continue his current meds.  

## 2015-02-24 NOTE — Assessment & Plan Note (Signed)
He will be continued to encourage him to stop drinking. Will follow.

## 2015-02-24 NOTE — Assessment & Plan Note (Signed)
He is in NSR on flecainide. I have asked him not to miss his meds and stop ETOH. Because of his increase in dose of flecainide, he will need to return in a week or two and have an ECG.

## 2015-02-24 NOTE — Progress Notes (Signed)
HPI Mr. Lafata returns today for followup. He is a pleasant 74 yo man with a h/o HTN, PAF, remote h/o atrial flutter, s/p ablation, h/o atrial fib, s/p ablation, and non-compliance. He . I saw the patient several days ago and he was having some breakthroughs of his atrial fib, especially when he was drinking ETOH. He was encouraged not to miss his medications and stop drinking which he refuses to do. He had recurrent atrial fib and was seen in the ED. He was told that he can increase his flecainide by taking 150 bid. He has done this although he still admits to occaissionally missing his medications and drinking too much.    No Known Allergies   Current Outpatient Prescriptions  Medication Sig Dispense Refill  . acetaminophen (TYLENOL) 325 MG tablet Take 650 mg by mouth every 6 (six) hours as needed (pain). Alternates with extra strength Tylenol    . acetaminophen (TYLENOL) 500 MG tablet Take 500 mg by mouth every 6 (six) hours as needed (pain).    . dabigatran (PRADAXA) 150 MG CAPS capsule Take 1 capsule (150 mg total) by mouth every 12 (twelve) hours. 30 capsule 11  . diltiazem (CARDIZEM) 30 MG tablet Take 1-2 tablets by mouth every 6 hours as needed for fast heartrates 60 tablet 1  . flecainide (TAMBOCOR) 100 MG tablet Take 150 mg by mouth 2 (two) times daily.    Marland Kitchen lisinopril (PRINIVIL,ZESTRIL) 20 MG tablet Take 0.5 tablets (10 mg total) by mouth 2 (two) times daily. 45 tablet 1  . LOTEMAX 0.5 % GEL Place 1 drop into the right eye 2 (two) times daily.  0  . neomycin-polymyxin b-dexamethasone (MAXITROL) 3.5-10000-0.1 OINT apply to SUTURES 2 TIMES A DAY FOR 3 DAYS THEN DISCONTINUE AND USE VASELINE  0  . nitroGLYCERIN (NITROSTAT) 0.4 MG SL tablet Place 1 tablet (0.4 mg total) under the tongue every 5 (five) minutes as needed for chest pain (MAX 3 TABLETS). 25 tablet 11   No current facility-administered medications for this visit.   Facility-Administered Medications Ordered in Other  Visits  Medication Dose Route Frequency Provider Last Rate Last Dose  . gadobenate dimeglumine (MULTIHANCE) injection 20 mL  20 mL Intravenous Once Medication Radiologist, MD         Past Medical History  Diagnosis Date  . Atrial flutter ([place])     s/p RFCA  by GT  . Dyslipidemia   . Hypertension   . Atrial fibrillation (HCC)     Paroxysmal, s/p PVI 11/11/10  . Basal cell carcinoma of ear   . GERD (gastroesophageal reflux disease)   . CAD (coronary artery disease)     a. cath 5/12: pLAD 25%, D1 25%, EF 65 % (done 2/2 abnormal myoview with inferapical  ischemia)  . TIA (transient ischemic attack)     11/12/10 post afib ablation  . Snoring     a. sleep study 11/2012 neg for OSA; increased leg movement suspicious for primary movement d/o (ie restless leg syndrome)   . Hx of echocardiogram     Echo (9/15):  Mild LVH, EF 55-60%, no RWMA, normal diast function, mild MR, mod LAE, mildly increased pulmonary pressures (PASP 30 mmHg).  Marland Kitchen HTN (hypertension)     ROS:   All systems reviewed and negative except as noted in the HPI.   Past Surgical History  Procedure Laterality Date  . Knee arthroscopy      bilateral  . Atrial flutter ablation  by GT  . Basal cell cancer resection    . Hemorrhoid surgery    . Eye surgery    . Tee without cardioversion  10/31/2011    Procedure: TRANSESOPHAGEAL ECHOCARDIOGRAM (TEE);  Surgeon: Jolaine Artist, MD;  [facility] ENDOSCOPY;  Service: Cardiovascular;  Laterality: N/A;  . Cardioversion  10/31/2011    Procedure: CARDIOVERSION;  Surgeon: Jolaine Artist, MD;  Location: [facility] ENDOSCOPY;  Service: Cardiovascular;  Laterality: N/A;     Family History  Problem Relation Age of Onset  . Hypertension Mother   . Stroke Mother   . Heart disease Mother   . Arthritis Mother   . Heart attack Neg Hx      Social History   Social History  . Marital Status: Divorced    Spouse Name: N/A  . Number of Children: 1  . Years of Education: N/A    Occupational History  . 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 Smoking status: Former Smoker -- 1.50 packs/day for 20 years    Types: Cigarettes    Quit date: 05/23/1975  . Smokeless tobacco: Never Used  . Alcohol Use: 7.0 oz/week    14 drink(s) per week     Comment: 2 drinks per night  . Drug Use: No  . Sexual Activity: Not on file   Other Topics Concern  . Not on file   Social History Narrative     BP 128/78 mmHg  Pulse 58  Ht 5\' 11"  (1.803 m)  Wt 211 lb 12.8 oz (96.072 kg)  BMI 29.55 kg/m2  Physical Exam:  Well appearing 74 year old man, NAD HEENT: Unremarkable Neck:  6 cm JVD, no thyromegally Lymphatics:  No adenopathy Back:  No CVA tenderness Lungs:  Clear, with no wheezes, rales, or rhonchi. HEART:  Regular rate rhythm, no murmurs, no rubs, no clicks Abd:  soft, positive bowel sounds, no organomegally, no rebound, no guarding Ext:  2 plus pulses, no edema, no cyanosis, no clubbing Skin:  No rashes no nodules Neuro:  CN II through XII intact, motor grossly intact  EKG - normal sinus rhythm with sinus bradycardia and left axis deviation.   Assess/Plan:

## 2015-02-24 NOTE — Assessment & Plan Note (Signed)
HIs blood pressure is controlled. He will continue his current meds.

## 2015-03-01 ENCOUNTER — Other Ambulatory Visit (INDEPENDENT_AMBULATORY_CARE_PROVIDER_SITE_OTHER): Payer: Medicare Other | Admitting: *Deleted

## 2015-03-01 ENCOUNTER — Ambulatory Visit (INDEPENDENT_AMBULATORY_CARE_PROVIDER_SITE_OTHER): Payer: Medicare Other

## 2015-03-01 DIAGNOSIS — I482 Chronic atrial fibrillation, unspecified: Secondary | ICD-10-CM

## 2015-03-01 DIAGNOSIS — I2109 ST elevation (STEMI) myocardial infarction involving other coronary artery of anterior wall: Secondary | ICD-10-CM

## 2015-03-01 DIAGNOSIS — I10 Essential (primary) hypertension: Secondary | ICD-10-CM

## 2015-03-01 LAB — BASIC METABOLIC PANEL
BUN: 18 mg/dL (ref 7–25)
CHLORIDE: 103 mmol/L (ref 98–110)
CO2: 26 mmol/L (ref 20–31)
Calcium: 9.2 mg/dL (ref 8.6–10.3)
Creat: 1.24 mg/dL — ABNORMAL HIGH (ref 0.70–1.18)
Glucose, Bld: 81 mg/dL (ref 65–99)
POTASSIUM: 4.4 mmol/L (ref 3.5–5.3)
SODIUM: 138 mmol/L (ref 135–146)

## 2015-03-01 LAB — MAGNESIUM: MAGNESIUM: 1.8 mg/dL (ref 1.5–2.5)

## 2015-03-01 NOTE — Progress Notes (Signed)
1.) Reason for visit: EKG  2.) Name of MD requesting visit: Dr Lovena Le  3.) H&P: The pt was seen in ED on 10/2 for elevated HR of 117 bpm.  4.) ROS related to problem: The pt was advised at the ED on 10/2 to increase Flecainide from 100 mg bid to 150 mg bid due to elevated HR of 117 bpm. The pt had f/u with Dr Lovena Le 2 days later on 10/4 and was advised to come to the office today for EKG.  5.) Assessment and plan per MD: EKG obtained and given to Dr Marlou Porch (DOD) for his review. Per Dr Marlou Porch QRS is ok and the pt is advised to continue current medications. Also, the pt asked if he can drink 1 glass of wine per week. Per Dr Marlou Porch the pt is advised that he may drink 1 glass of wine weekly.

## 2015-03-01 NOTE — Addendum Note (Signed)
Addended by: Eulis Foster on: 03/01/2015 10:23 AM   Modules accepted: Orders

## 2015-03-01 NOTE — Progress Notes (Signed)
1 

## 2015-03-12 ENCOUNTER — Telehealth: Payer: Self-pay | Admitting: Internal Medicine

## 2015-03-12 DIAGNOSIS — I482 Chronic atrial fibrillation, unspecified: Secondary | ICD-10-CM

## 2015-03-12 MED ORDER — FLECAINIDE ACETATE 150 MG PO TABS: 150.0000 mg | ORAL_TABLET | Freq: Two times a day (BID) | ORAL | Status: DC

## 2015-03-12 MED ORDER — FLECAINIDE ACETATE 100 MG PO TABS: 150.0000 mg | ORAL_TABLET | Freq: Two times a day (BID) | ORAL | Status: DC

## 2015-03-12 NOTE — Telephone Encounter (Signed)
New problem    Pt need new prescription for Flecainide 150mg . Pt was told to take 150mg  but when her received his prescription it was only 100mg . Please advise.

## 2015-03-12 NOTE — Telephone Encounter (Signed)
Sent in updated rx, with correct dosage, to pharmacy. They thanked me for helping with this.

## 2015-05-11 ENCOUNTER — Other Ambulatory Visit: Payer: Self-pay | Admitting: Internal Medicine

## 2015-05-27 ENCOUNTER — Encounter: Payer: Self-pay | Admitting: Internal Medicine

## 2015-05-27 ENCOUNTER — Ambulatory Visit (INDEPENDENT_AMBULATORY_CARE_PROVIDER_SITE_OTHER): Payer: Medicare Other | Admitting: Internal Medicine

## 2015-05-27 VITALS — BP 124/72 | HR 49 | Ht 71.0 in | Wt 211.0 lb

## 2015-05-27 DIAGNOSIS — F1010 Alcohol abuse, uncomplicated: Secondary | ICD-10-CM

## 2015-05-27 DIAGNOSIS — I25118 Atherosclerotic heart disease of native coronary artery with other forms of angina pectoris: Secondary | ICD-10-CM | POA: Diagnosis not present

## 2015-05-27 DIAGNOSIS — I482 Chronic atrial fibrillation, unspecified: Secondary | ICD-10-CM

## 2015-05-27 MED ORDER — APIXABAN 5 MG PO TABS: 5.0000 mg | ORAL_TABLET | Freq: Two times a day (BID) | ORAL | Status: DC

## 2015-05-27 NOTE — Assessment & Plan Note (Signed)
He is maintaining NSR on flecainide. He will continue his current meds.

## 2015-05-27 NOTE — Assessment & Plan Note (Signed)
Cardiac cath 4 years ago demonstrated negligible CAD with normal LV function.

## 2015-05-27 NOTE — Assessment & Plan Note (Signed)
He has cut his ETOH consumption way back. Will follow.

## 2015-05-27 NOTE — Patient Instructions (Addendum)
Medication Instructions:  Your physician has recommended you make the following change in your medication:  1) Stop Pradaxa 2) Start Eliquis 5 mg twice daily   Labwork: None ordered   Testing/Procedures: None ordered   Follow-Up: Your physician wants you to follow-up in: 6 months with Dr Knox Saliva will receive a reminder letter in the mail two months in advance. If you don't receive a letter, please call our office to schedule the follow-up appointment.   Any Other Special Instructions Will Be Listed Below (If Applicable).     If you need a refill on your cardiac medications before your next appointment, please call your pharmacy.

## 2015-05-27 NOTE — Progress Notes (Signed)
HPI [name] returns today for followup. He is a pleasant 75 yo man with a h/o HTN, PAF, remote h/o atrial flutter, s/p ablation, h/o atrial fib, s/p ablation, and non-compliance. He has increased his flecainide by taking 150 bid. He has done well in the interim. His atrial fib has been quiet.   No Known Allergies   Current Outpatient Prescriptions  Medication Sig Dispense Refill  . diltiazem (CARDIZEM) 30 MG tablet Take 1-2 tablets by mouth every 6 hours as needed for fast heartrates 60 tablet 1  . flecainide (TAMBOCOR) 150 MG tablet Take 1 tablet (150 mg total) by mouth 2 (two) times daily. 60 tablet 3  . lisinopril (PRINIVIL,ZESTRIL) 20 MG tablet take 1/2 tablet by mouth twice a day 30 tablet 0  . nitroGLYCERIN (NITROSTAT) 0.4 MG SL tablet Place 1 tablet (0.4 mg total) under the tongue every 5 (five) minutes as needed for chest pain (MAX 3 TABLETS). 25 tablet 11  . acetaminophen (TYLENOL) 325 MG tablet Take 650 mg by mouth every 6 (six) hours as needed (pain). Reported on 05/27/2015    . acetaminophen (TYLENOL) 500 MG tablet Take 500 mg by mouth every 6 (six) hours as needed (pain). Reported on 05/27/2015    . apixaban (ELIQUIS) 5 MG TABS tablet Take 1 tablet (5 mg total) by mouth 2 (two) times daily. 60 tablet 0   No current facility-administered medications for this visit.   Facility-Administered Medications Ordered in Other Visits  Medication Dose Route Frequency Provider Last Rate Last Dose  . gadobenate dimeglumine (MULTIHANCE) injection 20 mL  20 mL Intravenous Once Medication Radiologist, MD         Past Medical History  Diagnosis Date  . Atrial flutter ([place])     s/p RFCA  by GT  . Dyslipidemia   . Hypertension   . Atrial fibrillation (HCC)     Paroxysmal, s/p PVI 11/11/10  . Basal cell carcinoma of ear   . GERD (gastroesophageal reflux disease)   . CAD (coronary artery disease)     a. cath 5/12: pLAD 25%, D1 25%, EF 65 % (done 2/2 abnormal myoview with  inferapical  ischemia)  . TIA (transient ischemic attack)     11/12/10 post afib ablation  . Snoring     a. sleep study 11/2012 neg for OSA; increased leg movement suspicious for primary movement d/o (ie restless leg syndrome)   . Hx of echocardiogram     Echo (9/15):  Mild LVH, EF 55-60%, no RWMA, normal diast function, mild MR, mod LAE, mildly increased pulmonary pressures (PASP 30 mmHg).  Marland Kitchen HTN (hypertension)     ROS:   All systems reviewed and negative except as noted in the HPI.   Past Surgical History  Procedure Laterality Date  . Knee arthroscopy      bilateral  . Atrial flutter ablation      by GT  . Basal cell cancer resection    . Hemorrhoid surgery    . Eye surgery    . Tee without cardioversion  10/31/2011    Procedure: TRANSESOPHAGEAL ECHOCARDIOGRAM (TEE);  Surgeon: Jolaine Artist, MD;  Location: [facility] ENDOSCOPY;  Service: Cardiovascular;  Laterality: N/A;  . Cardioversion  10/31/2011    Procedure: CARDIOVERSION;  Surgeon: Jolaine Artist, MD;  Location: [facility] ENDOSCOPY;  Service: Cardiovascular;  Laterality: N/A;     Family History  Problem Relation Age of Onset  . Hypertension Mother   . Stroke Mother   .  Heart disease Mother   . Arthritis Mother   . Heart attack Neg Hx      Social History   Social History  . Marital Status: Divorced    Spouse Name: N/A  . Number of Children: 1  . Years of Education: N/A   Occupational History  . 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 Smoking status: Former Smoker -- 1.50 packs/day for 20 years    Types: Cigarettes    Quit date: 05/23/1975  . Smokeless tobacco: Never Used  . Alcohol Use: 7.0 oz/week    14 drink(s) per week     Comment: 2 drinks per night  . Drug Use: No  . Sexual Activity: Not on file   Other Topics Concern  . Not on file   Social History Narrative     BP 124/72 mmHg  Pulse 49  Ht 5\' 11"  (1.803 m)  Wt 211 lb (95.709 kg)  BMI 29.44  kg/m2  Physical Exam:  Well appearing 75 year old man, NAD HEENT: Unremarkable Neck:  6 cm JVD, no thyromegally Lymphatics:  No adenopathy Back:  No CVA tenderness Lungs:  Clear, with no wheezes, rales, or rhonchi. HEART:  Regular rate rhythm, no murmurs, no rubs, no clicks Abd:  soft, positive bowel sounds, no organomegally, no rebound, no guarding Ext:  2 plus pulses, no edema, no cyanosis, no clubbing Skin:  No rashes no nodules Neuro:  CN II through XII intact, motor grossly intact  EKG - normal sinus rhythm with sinus bradycardia and left axis deviation.   Assess/Plan:

## 2015-05-28 ENCOUNTER — Telehealth: Payer: Self-pay

## 2015-05-28 NOTE — Telephone Encounter (Signed)
Prior auth for Eliquis 5 mg sent to [facility].

## 2015-05-31 ENCOUNTER — Telehealth: Payer: Self-pay

## 2015-05-31 NOTE — Telephone Encounter (Signed)
Eliquis approved through 05/21/2016. PA # [id number].

## 2015-06-02 ENCOUNTER — Telehealth: Payer: Self-pay

## 2015-06-02 NOTE — Telephone Encounter (Signed)
Eliquis 5mg  approved  Through 05/21/2016. PA # [id number]

## 2015-06-14 ENCOUNTER — Other Ambulatory Visit: Payer: Self-pay | Admitting: Internal Medicine

## 2015-07-15 ENCOUNTER — Other Ambulatory Visit: Payer: Self-pay | Admitting: Orthopedic Surgery

## 2015-07-16 ENCOUNTER — Telehealth: Payer: Self-pay | Admitting: Internal Medicine

## 2015-07-16 ENCOUNTER — Other Ambulatory Visit: Payer: Self-pay | Admitting: *Deleted

## 2015-07-16 MED ORDER — APIXABAN 5 MG PO TABS: 5.0000 mg | ORAL_TABLET | Freq: Two times a day (BID) | ORAL | Status: DC

## 2015-07-16 NOTE — Telephone Encounter (Signed)
°*  STAT* If patient is at the pharmacy, call can be transferred to refill team.   1. Which medications need to be refilled? (please list name of each medication and dose if known) Eliquis -need new prescription-she did not know mg  2. Which pharmacy/location (including street and city if local pharmacy) is medication to be sent to?[facility]-[address], [place]  3. Do they need a 30 day or 90 day supply? 60 and refills

## 2015-07-19 ENCOUNTER — Telehealth: Payer: Self-pay | Admitting: Internal Medicine

## 2015-07-19 NOTE — Telephone Encounter (Addendum)
Reviewed with Dr Lovena Le, he has signed the clearance and I will fax now.

## 2015-07-19 NOTE — Telephone Encounter (Signed)
Following on fax for clearance for Rt Shoulder Surgery,this was faxed on 07-08-15. She needs this back asap please.

## 2015-07-20 ENCOUNTER — Other Ambulatory Visit: Payer: Self-pay | Admitting: Orthopedic Surgery

## 2015-07-29 ENCOUNTER — Encounter (HOSPITAL_BASED_OUTPATIENT_CLINIC_OR_DEPARTMENT_OTHER): Payer: Self-pay | Admitting: *Deleted

## 2015-07-30 NOTE — Progress Notes (Signed)
Anesthesia Chart Review: SAME DAY WORK-UP.   Patient is a 75 year old male scheduled for right shoulder arthroscopy on 08/04/15 by Dr. Mayer Camel. 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 Special needs: General with scalene block.   Case was initially posted for [facility] but due to cardiac history and SOB, anesthesiologist Dr. Al Corpus felt case should be moved to the Main OR.   Common law wife is Shelbie Proctor. [phone].  History includes HTN, mild CAD '12, aflutter s/p ablation 12/2008, afib s/p radiofrequency ablation 11/11/10 and s/p DCCV 10/31/11, TIA (expressive aphasia following 11/12/10 afib ablation), bradycardia, former smoker, dyslipidemia, HTN, skin cancer (BCC), TIA post afib ablation 11/12/10, exertional dyspnea (ie, walking to mailbox).  Meds include Eliquis, flecainide, lisinopril, Nitro.  PCP is listed as Dr. Donavan Burnet.  Cardiologist is Dr. Cristopher Peru, last visit 05/27/15. On 07/19/15, he wrote "low surgical risk." [facility] PharmD Elberta Leatherwood wrote (per protocol), "Pt has a history of TIA. Would hold Eliquis for 36 hours prior to procedure & resume ASAP."  05/28/15 EKG: Marked SB at 49 bpm, first degree AVB, LAD, inferior infarct (age undetermined).  01/27/14 Echo: Impressions: - Normal LV function, LVEF 55-60%; moderate LAE; mild MR and TR; mildly elevatedpulmonary pressures.  09/29/10 Cardiac cath (done due to CP with apical ischemia on cardiolite): Coronaries: Left main was normal. The LAD had a long proximal 25% stenosis. First diagonal had proximal long 25% stenosis. The circumflex was normal. First obtuse marginal was small and normal. The second obtuse marginal was moderate sized and normal. The right coronary artery was dominant and normal. There was a PDA which was small and normal. The posterolateral was small and normal. Left ventriculogram was obtained in the RAO projection. The EF was 65% with normal wall motion. CONCLUSION: Minimal  coronary plaque. PLAN: No further cardiovascular testing is suggested. The patient will follow up with his primary care physician for evaluation of nonanginal chest discomfort.  11/13/10 MRI/MRA head/neck: IMPRESSION: MRI (head): Normal examination. No evidence of acute infarction or other reversible process. No chronic pathologic finding. MRA (head): Normal intracranial MR angiography of the large and medium-sized vessels. MRA (neck): Suboptimal bolus timing. However, review of source images allows high confidence of normal anatomy without any stenosis or other vascular pathology.  01/13/15 Spirometry: FVC 2.95 (64%), FEV1 2.40 (69%), FEF 25-75% 2.65 (88%). Interpretation: Moderate restriction.  He is for labs on the day of surgery.   History and anti-coagulation instructions reviewed with anesthesiologist Dr. Smith Robert. No new recommendations. He will be further evaluated on the day of surgery to ensure no acute changes prior to proceeding.  [facility] Short Stay Center/Anesthesiology Phone [phone] 07/30/2015 1:49 PM

## 2015-07-30 NOTE — Progress Notes (Signed)
Chart reviewed by Dr Al Corpus, due to pt's extensive cardiac hx and recent complaints of SOB, he will be better served being done at [place]. Horatio Pel at Dr Damita Dunnings office notified.

## 2015-08-03 ENCOUNTER — Encounter (HOSPITAL_COMMUNITY): Payer: Self-pay | Admitting: *Deleted

## 2015-08-03 MED ORDER — CEFAZOLIN SODIUM-DEXTROSE 2-3 GM-% IV SOLR
2.0000 g | INTRAVENOUS | Status: AC
  Administered 2015-08-04: 2 g via INTRAVENOUS
  Filled 2015-08-03: qty 50

## 2015-08-03 MED ORDER — GLYCOPYRROLATE 0.2 MG/ML IJ SOLN: 0.2000 mg | Freq: Once | INTRAMUSCULAR | Status: DC | PRN

## 2015-08-03 MED ORDER — DEXTROSE-NACL 5-0.45 % IV SOLN: INTRAVENOUS | Status: DC

## 2015-08-03 MED ORDER — FENTANYL CITRATE (PF) 100 MCG/2ML IJ SOLN: 50.0000 ug | INTRAMUSCULAR | Status: DC | PRN

## 2015-08-03 MED ORDER — SCOPOLAMINE 1 MG/3DAYS TD PT72: 1.0000 | MEDICATED_PATCH | Freq: Once | TRANSDERMAL | Status: DC | PRN

## 2015-08-03 MED ORDER — MIDAZOLAM HCL 2 MG/2ML IJ SOLN: 1.0000 mg | INTRAMUSCULAR | Status: DC | PRN

## 2015-08-03 MED ORDER — LACTATED RINGERS IV SOLN: INTRAVENOUS | Status: DC

## 2015-08-03 NOTE — H&P (Signed)
Erik [name] is an 75 y.o. male.    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  He has no pain with normal activities at age 89.  An MRI scan is been accomplished showing some mild degenerative changes in the a.c. joint moderate degenerative changes in the glenohumeral joint a 1 x 2 x 1 x 0.8 cm cystic lesion in the posterior glenoid consistent with a para labral cyst consistent with labral tearing.  The rotator cuff is intact and there is an incidental lipoma in the infraspinatus muscle.  Reviewed his cortisone injection has not provided any significant relief except for the time when the anesthetic was in place.  Past Medical History  Diagnosis Date  . Atrial flutter ([place])     s/p RFCA  by GT  . Dyslipidemia   . Hypertension   . Atrial fibrillation (HCC)     Paroxysmal, s/p PVI 11/11/10  . Basal cell carcinoma of ear   . CAD (coronary artery disease)     a. cath 5/12: pLAD 25%, D1 25%, EF 65 % (done 2/2 abnormal myoview with inferapical  ischemia)  . TIA (transient ischemic attack)     11/12/10 post afib ablation  . Snoring     a. sleep study 11/2012 neg for OSA; increased leg movement suspicious for primary movement d/o (ie restless leg syndrome)   . Hx of echocardiogram     Echo (9/15):  Mild LVH, EF 55-60%, no RWMA, normal diast function, mild MR, mod LAE, mildly increased pulmonary pressures (PASP 30 mmHg).  Marland Kitchen HTN (hypertension)   . Arthritis     both knees  . Dysrhythmia   . Stroke [facility])     mild stroke with  second heart ablation  . Shortness of breath dyspnea     has a low heart rate    Past Surgical History  Procedure Laterality Date  . Knee arthroscopy      bilateral  . Atrial flutter ablation      by GT  . Basal cell cancer resection    . Hemorrhoid surgery    . Eye surgery    . Tee without cardioversion  10/31/2011    Procedure:  TRANSESOPHAGEAL ECHOCARDIOGRAM (TEE);  Surgeon: Jolaine Artist, MD;  Location: [facility] ENDOSCOPY;  Service: Cardiovascular;  Laterality: N/A;  . Cardioversion  10/31/2011    Procedure: CARDIOVERSION;  Surgeon: Jolaine Artist, MD;  Location: [facility] ENDOSCOPY;  Service: Cardiovascular;  Laterality: N/A;    Family History  Problem Relation Age of Onset  . Hypertension Mother   . Stroke Mother   . Heart disease Mother   . Arthritis Mother   . Heart attack Neg Hx    Social History:  reports that he quit smoking about 40 years ago. His smoking use included Cigarettes. He has a 30 pack-year smoking history. He has never used smokeless tobacco. He reports that he does not drink alcohol or use illicit drugs.  Allergies: No Known Allergies  No prescriptions prior to admission    No results found for this or any previous visit (from the past 48 hour(s)). No results found.  Review of Systems  Respiratory: Positive for shortness of breath.   Cardiovascular: Positive for palpitations.       HTN, Irregular Heart Beat  Gastrointestinal: Positive for heartburn.  Genitourinary: Positive for frequency.  Weak stream and enlarged prostate  Musculoskeletal: Positive for joint pain.  Endo/Heme/Allergies: Bruises/bleeds easily.    Height 5\' 11"  (1.803 m), weight 90.719 kg (200 lb). Physical Exam  Constitutional: He is oriented to person, place, and time. He appears well-developed and well-nourished.  HENT:  Head: Normocephalic and atraumatic.  Eyes: Pupils are equal, round, and reactive to light.  Neck: Normal range of motion. Neck supple.  Cardiovascular: Intact distal pulses.   Respiratory: Effort normal.  Musculoskeletal:  Full range of motion of the right shoulder impingement signs are 2+ positive to flexion internal rotation.  Rotator cuff Powers grossly intact.  Labral grind test does reproduce some of his shoulder pain.  Neurological: He is alert and oriented to person, place, and  time.  Skin: Skin is warm and dry.  Psychiatric: He has a normal mood and affect. His behavior is normal. Judgment and thought content normal.     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  Having failed conservative treatment with anti-inflammatory medicines, exercises and cortisone injection.  Plan: Patient very much wants to operate a compound bow, the risks and benefits of arthroscopic evaluation treatment of the right shoulder were discussed at length with the patient.  The plan will be for debridement of any labral tearing, removal of the para labral cyst and debridement of any chondromalacia.  I will see him back at the time of surgical intervention.  This be done as an outpatient.  Tyrail Grandfield R, PA-C 08/03/2015, 12:34 PM

## 2015-08-04 ENCOUNTER — Ambulatory Visit (HOSPITAL_COMMUNITY)
Admission: RE | Admit: 2015-08-04 | Discharge: 2015-08-04 | Disposition: A | Discharge status: Home / Self Care | Payer: Medicare Other | Source: Ambulatory Visit | Attending: Orthopedic Surgery | Admitting: Orthopedic Surgery

## 2015-08-04 ENCOUNTER — Ambulatory Visit (HOSPITAL_COMMUNITY): Payer: Medicare Other | Admitting: Emergency Medicine

## 2015-08-04 ENCOUNTER — Encounter (HOSPITAL_COMMUNITY): Payer: Self-pay | Admitting: *Deleted

## 2015-08-04 ENCOUNTER — Encounter (HOSPITAL_COMMUNITY)
Admission: RE | Disposition: A | Discharge status: Home / Self Care | Payer: Self-pay | Source: Ambulatory Visit | Attending: Orthopedic Surgery

## 2015-08-04 DIAGNOSIS — Z87891 Personal history of nicotine dependence: Secondary | ICD-10-CM | POA: Diagnosis not present

## 2015-08-04 DIAGNOSIS — Z8673 Personal history of transient ischemic attack (TIA), and cerebral infarction without residual deficits: Secondary | ICD-10-CM | POA: Insufficient documentation

## 2015-08-04 DIAGNOSIS — M19011 Primary osteoarthritis, right shoulder: Secondary | ICD-10-CM | POA: Diagnosis not present

## 2015-08-04 DIAGNOSIS — I480 Paroxysmal atrial fibrillation: Secondary | ICD-10-CM | POA: Insufficient documentation

## 2015-08-04 DIAGNOSIS — I2510 Atherosclerotic heart disease of native coronary artery without angina pectoris: Secondary | ICD-10-CM | POA: Insufficient documentation

## 2015-08-04 DIAGNOSIS — M24111 Other articular cartilage disorders, right shoulder: Secondary | ICD-10-CM | POA: Diagnosis not present

## 2015-08-04 DIAGNOSIS — M7541 Impingement syndrome of right shoulder: Secondary | ICD-10-CM | POA: Diagnosis present

## 2015-08-04 DIAGNOSIS — E785 Hyperlipidemia, unspecified: Secondary | ICD-10-CM | POA: Diagnosis not present

## 2015-08-04 DIAGNOSIS — I10 Essential (primary) hypertension: Secondary | ICD-10-CM | POA: Diagnosis not present

## 2015-08-04 DIAGNOSIS — Z79899 Other long term (current) drug therapy: Secondary | ICD-10-CM | POA: Diagnosis not present

## 2015-08-04 HISTORY — DX: Cardiac arrhythmia, unspecified: I49.9

## 2015-08-04 HISTORY — DX: Reserved for inherently not codable concepts without codable children: IMO0001

## 2015-08-04 HISTORY — DX: Unspecified osteoarthritis, unspecified site: M19.90

## 2015-08-04 HISTORY — DX: Cerebral infarction, unspecified: I63.9

## 2015-08-04 HISTORY — PX: SHOULDER ARTHROSCOPY: SHX128

## 2015-08-04 LAB — BASIC METABOLIC PANEL
Anion gap: 12 (ref 5–15)
BUN: 16 mg/dL (ref 6–20)
CALCIUM: 9.7 mg/dL (ref 8.9–10.3)
CO2: 25 mmol/L (ref 22–32)
CREATININE: 1.16 mg/dL (ref 0.61–1.24)
Chloride: 106 mmol/L (ref 101–111)
GFR calc Af Amer: >60 mL/min (ref >60)
GFR calc non Af Amer: >60 mL/min (ref >60)
GLUCOSE: 90 mg/dL (ref 65–99)
Potassium: 4.8 mmol/L (ref 3.5–5.1)
SODIUM: 143 mmol/L (ref 135–145)

## 2015-08-04 LAB — CBC
HCT: 46.2 % (ref 39.0–52.0)
Hemoglobin: 14.9 g/dL (ref 13.0–17.0)
MCH: 30.7 pg (ref 26.0–34.0)
MCHC: 32.3 g/dL (ref 30.0–36.0)
MCV: 95.3 fL (ref 78.0–100.0)
PLATELETS: 256 10*3/uL (ref 150–400)
RBC: 4.85 MIL/uL (ref 4.22–5.81)
RDW: 12.9 % (ref 11.5–15.5)
WBC: 6.9 10*3/uL (ref 4.0–10.5)

## 2015-08-04 LAB — PROTIME-INR
INR: 0.96 (ref 0.00–1.49)
Prothrombin Time: 13 seconds (ref 11.6–15.2)

## 2015-08-04 SURGERY — ARTHROSCOPY, SHOULDER
Anesthesia: General | Site: Shoulder | Laterality: Right

## 2015-08-04 MED ORDER — ONDANSETRON HCL 4 MG/2ML IJ SOLN
INTRAMUSCULAR | Status: DC | PRN
  Administered 2015-08-04: 4 mg via INTRAVENOUS

## 2015-08-04 MED ORDER — EPINEPHRINE HCL 1 MG/ML IJ SOLN
INTRAMUSCULAR | Status: AC
  Filled 2015-08-04: qty 1

## 2015-08-04 MED ORDER — EPHEDRINE SULFATE 50 MG/ML IJ SOLN
INTRAMUSCULAR | Status: DC | PRN
  Administered 2015-08-04 (×3): 5 mg via INTRAVENOUS

## 2015-08-04 MED ORDER — CHLORHEXIDINE GLUCONATE 4 % EX LIQD: 60.0000 mL | Freq: Once | CUTANEOUS | Status: DC

## 2015-08-04 MED ORDER — LACTATED RINGERS IV SOLN
INTRAVENOUS | Status: DC
  Administered 2015-08-04: 12:00:00 via INTRAVENOUS

## 2015-08-04 MED ORDER — BUPIVACAINE-EPINEPHRINE (PF) 0.5% -1:200000 IJ SOLN
INTRAMUSCULAR | Status: DC | PRN
  Administered 2015-08-04: 30 mL via PERINEURAL

## 2015-08-04 MED ORDER — ONDANSETRON HCL 4 MG/2ML IJ SOLN
INTRAMUSCULAR | Status: AC
  Filled 2015-08-04: qty 2

## 2015-08-04 MED ORDER — HYDROCODONE-ACETAMINOPHEN 5-325 MG PO TABS: 1.0000 | ORAL_TABLET | Freq: Four times a day (QID) | ORAL | Status: DC | PRN

## 2015-08-04 MED ORDER — PROPOFOL 10 MG/ML IV BOLUS
INTRAVENOUS | Status: AC
  Filled 2015-08-04: qty 20

## 2015-08-04 MED ORDER — BUPIVACAINE-EPINEPHRINE 0.5% -1:200000 IJ SOLN
INTRAMUSCULAR | Status: DC | PRN
  Administered 2015-08-04: 8 mL

## 2015-08-04 MED ORDER — MIDAZOLAM HCL 2 MG/2ML IJ SOLN
INTRAMUSCULAR | Status: AC
  Administered 2015-08-04: 2 mg
  Filled 2015-08-04: qty 2

## 2015-08-04 MED ORDER — FENTANYL CITRATE (PF) 100 MCG/2ML IJ SOLN
INTRAMUSCULAR | Status: DC | PRN
  Administered 2015-08-04: 50 ug via INTRAVENOUS

## 2015-08-04 MED ORDER — EPHEDRINE SULFATE 50 MG/ML IJ SOLN
INTRAMUSCULAR | Status: AC
  Filled 2015-08-04: qty 1

## 2015-08-04 MED ORDER — HYDROMORPHONE HCL 1 MG/ML IJ SOLN: 0.2500 mg | INTRAMUSCULAR | Status: DC | PRN

## 2015-08-04 MED ORDER — LACTATED RINGERS IV SOLN
INTRAVENOUS | Status: DC | PRN
  Administered 2015-08-04 (×2): via INTRAVENOUS

## 2015-08-04 MED ORDER — FENTANYL CITRATE (PF) 250 MCG/5ML IJ SOLN
INTRAMUSCULAR | Status: AC
  Filled 2015-08-04: qty 5

## 2015-08-04 MED ORDER — FENTANYL CITRATE (PF) 100 MCG/2ML IJ SOLN
INTRAMUSCULAR | Status: AC
  Administered 2015-08-04: 100 ug
  Filled 2015-08-04: qty 2

## 2015-08-04 MED ORDER — ROCURONIUM BROMIDE 50 MG/5ML IV SOLN
INTRAVENOUS | Status: AC
  Filled 2015-08-04: qty 1

## 2015-08-04 MED ORDER — ONDANSETRON HCL 4 MG/2ML IJ SOLN: 4.0000 mg | Freq: Once | INTRAMUSCULAR | Status: DC | PRN

## 2015-08-04 MED ORDER — PHENYLEPHRINE HCL 10 MG/ML IJ SOLN
10.0000 mg | INTRAVENOUS | Status: DC | PRN
  Administered 2015-08-04: 20 ug/min via INTRAVENOUS

## 2015-08-04 MED ORDER — SUCCINYLCHOLINE CHLORIDE 20 MG/ML IJ SOLN
INTRAMUSCULAR | Status: DC | PRN
  Administered 2015-08-04: 100 mg via INTRAVENOUS

## 2015-08-04 MED ORDER — BUPIVACAINE-EPINEPHRINE (PF) 0.5% -1:200000 IJ SOLN
INTRAMUSCULAR | Status: AC
  Filled 2015-08-04: qty 30

## 2015-08-04 MED ORDER — LIDOCAINE HCL (CARDIAC) 20 MG/ML IV SOLN
INTRAVENOUS | Status: AC
  Filled 2015-08-04: qty 5

## 2015-08-04 MED ORDER — PROPOFOL 10 MG/ML IV BOLUS
INTRAVENOUS | Status: DC | PRN
  Administered 2015-08-04: 130 mg via INTRAVENOUS

## 2015-08-04 MED ORDER — EPINEPHRINE HCL 1 MG/ML IJ SOLN
INTRAMUSCULAR | Status: DC | PRN
  Administered 2015-08-04 (×2): 1 mg

## 2015-08-04 SURGICAL SUPPLY — 58 items
BLADE CUTTER GATOR 3.5 (BLADE) ×2 IMPLANT
BLADE GREAT WHITE 4.2 (BLADE) ×2 IMPLANT
BLADE GREAT WHITE 4.2MM (BLADE) ×1
BUR OVAL 4.0 (BURR) IMPLANT
BUR VERTEX HOODED 4.5 (BURR) ×2 IMPLANT
COVER SURGICAL LIGHT HANDLE (MISCELLANEOUS) ×3 IMPLANT
DRAPE IMP U-DRAPE 54X76 (DRAPES) ×3 IMPLANT
DRAPE INCISE IOBAN 66X45 STRL (DRAPES) IMPLANT
DRAPE ORTHO SPLIT 77X108 STRL (DRAPES) ×6
DRAPE STERI 35X30 U-POUCH (DRAPES) ×3 IMPLANT
DRAPE SURG ORHT 6 SPLT 77X108 (DRAPES) ×2 IMPLANT
DRAPE U-SHAPE 47X51 STRL (DRAPES) ×6 IMPLANT
DRSG EMULSION OIL 3X3 NADH (GAUZE/BANDAGES/DRESSINGS) ×3 IMPLANT
DRSG PAD ABDOMINAL 8X10 ST (GAUZE/BANDAGES/DRESSINGS) ×3 IMPLANT
DURAPREP 26ML APPLICATOR (WOUND CARE) ×3 IMPLANT
ELECT MENISCUS 165MM 90D (ELECTRODE) IMPLANT
ELECT REM PT RETURN 9FT ADLT (ELECTROSURGICAL)
ELECTRODE REM PT RTRN 9FT ADLT (ELECTROSURGICAL) IMPLANT
FLUID NSS /IRRIG 3000 ML XXX (IV SOLUTION) ×9 IMPLANT
GAUZE SPONGE 4X4 12PLY STRL (GAUZE/BANDAGES/DRESSINGS) ×3 IMPLANT
GAUZE XEROFORM 5X9 LF (GAUZE/BANDAGES/DRESSINGS) ×2 IMPLANT
GLOVE BIO SURGEON STRL SZ7.5 (GLOVE) ×3 IMPLANT
GLOVE BIO SURGEON STRL SZ8.5 (GLOVE) ×6 IMPLANT
GLOVE BIOGEL M STRL SZ7.5 (GLOVE) ×4 IMPLANT
GLOVE BIOGEL PI IND STRL 7.0 (GLOVE) IMPLANT
GLOVE BIOGEL PI IND STRL 8 (GLOVE) ×2 IMPLANT
GLOVE BIOGEL PI IND STRL 9 (GLOVE) ×1 IMPLANT
GLOVE BIOGEL PI INDICATOR 7.0 (GLOVE) ×4
GLOVE BIOGEL PI INDICATOR 8 (GLOVE) ×4
GLOVE BIOGEL PI INDICATOR 9 (GLOVE) ×2
GOWN STRL REUS W/ TWL LRG LVL3 (GOWN DISPOSABLE) IMPLANT
GOWN STRL REUS W/ TWL XL LVL3 (GOWN DISPOSABLE) ×3 IMPLANT
GOWN STRL REUS W/TWL LRG LVL3 (GOWN DISPOSABLE)
GOWN STRL REUS W/TWL XL LVL3 (GOWN DISPOSABLE) ×9
KIT BASIN OR (CUSTOM PROCEDURE TRAY) ×3 IMPLANT
KIT ROOM TURNOVER OR (KITS) ×3 IMPLANT
MANIFOLD NEPTUNE II (INSTRUMENTS) ×3 IMPLANT
NDL 18GX1X1/2 (RX/OR ONLY) (NEEDLE) ×1 IMPLANT
NDL SUT 6 .5 CRC .975X.05 MAYO (NEEDLE) IMPLANT
NEEDLE 18GX1X1/2 (RX/OR ONLY) (NEEDLE) ×3 IMPLANT
NEEDLE MAYO TAPER (NEEDLE)
NS IRRIG 1000ML POUR BTL (IV SOLUTION) ×3 IMPLANT
PACK ARTHROSCOPY DSU (CUSTOM PROCEDURE TRAY) ×3 IMPLANT
PACK UNIVERSAL I (CUSTOM PROCEDURE TRAY) ×3 IMPLANT
PAD ARMBOARD 7.5X6 YLW CONV (MISCELLANEOUS) ×6 IMPLANT
PENCIL BUTTON HOLSTER BLD 10FT (ELECTRODE) IMPLANT
SPONGE GAUZE 4X4 12PLY STER LF (GAUZE/BANDAGES/DRESSINGS) ×2 IMPLANT
SPONGE LAP 4X18 X RAY DECT (DISPOSABLE) ×3 IMPLANT
SUCTION FRAZIER HANDLE 10FR (MISCELLANEOUS)
SUCTION TUBE FRAZIER 10FR DISP (MISCELLANEOUS) IMPLANT
SUT ETHIBOND 2 OS 4 DA (SUTURE) IMPLANT
SUT VIC AB 2-0 FS1 27 (SUTURE) IMPLANT
SYR 3ML 25GX5/8 SAFETY (SYRINGE) ×3 IMPLANT
TOWEL OR 17X24 6PK STRL BLUE (TOWEL DISPOSABLE) ×3 IMPLANT
TOWEL OR 17X26 10 PK STRL BLUE (TOWEL DISPOSABLE) ×3 IMPLANT
WAND HAND CNTRL MULTIVAC 90 (MISCELLANEOUS) ×3 IMPLANT
WATER STERILE IRR 1000ML POUR (IV SOLUTION) ×3 IMPLANT
YANKAUER SUCT BULB TIP NO VENT (SUCTIONS) IMPLANT

## 2015-08-04 NOTE — Interval H&P Note (Signed)
History and Physical Interval Note:  08/04/2015 12:44 PM  Erik [name]  has presented today for surgery, with the diagnosis of right shoulder labral tear with cyst and ac joint djd  The various methods of treatment have been discussed with the patient and family. After consideration of risks, benefits and other options for treatment, the patient has consented to  Procedure(s): ARTHROSCOPY RIGHT SHOULDER (Right) as a surgical intervention .  The patient's history has been reviewed, patient examined, no change in status, stable for surgery.  I have reviewed the patient's chart and labs.  Questions were answered to the patient's satisfaction.     Kerin Salen

## 2015-08-04 NOTE — Progress Notes (Signed)
Report given to jena rn as caregiver 

## 2015-08-04 NOTE — Anesthesia Preprocedure Evaluation (Addendum)
Anesthesia Evaluation  Patient identified by MRN, date of birth, ID band Patient awake    Reviewed: Allergy & Precautions, NPO status , Patient's Chart, lab work & pertinent test results  History of Anesthesia Complications Negative for: history of anesthetic complications  Airway Mallampati: II  TM Distance: >3 FB Neck ROM: Full    Dental  (+) Teeth Intact, Dental Advisory Given   Pulmonary former smoker,    breath sounds clear to auscultation       Cardiovascular hypertension, + dysrhythmias Atrial Fibrillation  Rhythm:Regular Rate:Normal  Echo (9/15): Mild LVH, EF 55-60%, no RWMA, normal diast function, mild MR, mod LAE, mildly increased pulmonary pressures (PASP 30 mmHg).   Neuro/Psych TIACVA negative psych ROS   GI/Hepatic negative GI ROS, Neg liver ROS,   Endo/Other  negative endocrine ROS  Renal/GU negative Renal ROS     Musculoskeletal   Abdominal   Peds  Hematology   Anesthesia Other Findings   Reproductive/Obstetrics                           Anesthesia Physical Anesthesia Plan  ASA: III  Anesthesia Plan: General   Post-op Pain Management: GA combined w/ Regional for post-op pain   Induction: Intravenous  Airway Management Planned: Oral ETT  Additional Equipment:   Intra-op Plan:   Post-operative Plan: Extubation in OR  Informed Consent: I have reviewed the patients History and Physical, chart, labs and discussed the procedure including the risks, benefits and alternatives for the proposed anesthesia with the patient or authorized representative who has indicated his/her understanding and acceptance.   Dental advisory given  Plan Discussed with: CRNA and Anesthesiologist  Anesthesia Plan Comments:         Anesthesia Quick Evaluation

## 2015-08-04 NOTE — Anesthesia Postprocedure Evaluation (Signed)
Anesthesia Post Note  Patient: Erik [name]  Procedure(s) Performed: Procedure(s) (LRB): ARTHROSCOPY RIGHT SHOULDER (Right)  Patient location during evaluation: PACU Anesthesia Type: General Level of consciousness: sedated Pain management: pain level controlled Vital Signs Assessment: post-procedure vital signs reviewed and stable Respiratory status: spontaneous breathing and respiratory function stable Cardiovascular status: stable Anesthetic complications: no    Last Vitals:  Filed Vitals:   08/04/15 1502 08/04/15 1517  BP: 134/82 138/98  Pulse: 56 64  Temp:    Resp: 15 12    Last Pain:  Filed Vitals:   08/04/15 1518  PainSc: 0-No pain                 [name] [name]

## 2015-08-04 NOTE — Transfer of Care (Signed)
Immediate Anesthesia Transfer of Care Note  Patient: Erik [name]  Procedure(s) Performed: Procedure(s): ARTHROSCOPY RIGHT SHOULDER (Right)  Patient Location: PACU  Anesthesia Type:General and Regional  Level of Consciousness: awake and alert   Airway & Oxygen Therapy: Patient Spontanous Breathing and Patient connected to face mask oxygen  Post-op Assessment: Report given to RN, Post -op Vital signs reviewed and stable and Patient moving all extremities X 4  Post vital signs: Reviewed and stable  Last Vitals:  Filed Vitals:   08/04/15 1152  BP: 132/72  Pulse: 52  Temp: 36.4 C  Resp: 18    Complications: No apparent anesthesia complications

## 2015-08-04 NOTE — Anesthesia Procedure Notes (Addendum)
Anesthesia Regional Block:  Interscalene brachial plexus block  Pre-Anesthetic Checklist: ,, timeout performed, Correct Patient, Correct Site, Correct Laterality, Correct Procedure,, site marked, risks and benefits discussed, Surgical consent,  Pre-op evaluation,  At surgeon's request and post-op pain management  Laterality: Right  Prep: chloraprep       Needles:  Injection technique: Single-shot  Needle Type: Echogenic Stimulator Needle     Needle Length: 5cm 5 cm Needle Gauge: 22 and 22 G    Additional Needles:  Procedures: ultrasound guided (picture in chart) and nerve stimulator Interscalene brachial plexus block  Nerve Stimulator or Paresthesia:  Response: bicep contraction, 0.48 mA,   Additional Responses:   Narrative:  Start time: 08/04/2015 12:51 PM End time: 08/04/2015 1:01 PM Injection made incrementally with aspirations every 5 mL.  Performed by: Personally   Additional Notes: Functioning IV was confirmed and monitors applied.  A 47mm 22ga echogenic arrow stimulator was used. Sterile prep and drape,hand hygiene and sterile gloves were used.Ultrasound guidance: relevent anatomy identified, needle position confirmed, local anesthetic spread visualized around nerve(s)., vascular puncture avoided.  Image printed for medical record.  Negative aspiration and negative test dose prior to incremental administration of local anesthetic. The patient tolerated the procedure well.   Procedure Name: Intubation Date/Time: 08/04/2015 1:22 PM Performed by: Garrison Columbus T Pre-anesthesia Checklist: Patient identified, Emergency Drugs available, Suction available and Patient being monitored Patient Re-evaluated:Patient Re-evaluated prior to inductionOxygen Delivery Method: Circle system utilized Preoxygenation: Pre-oxygenation with 100% oxygen Intubation Type: IV induction Ventilation: Mask ventilation without difficulty Laryngoscope Size: Miller and 2 Grade View: Grade I Tube  type: Oral Tube size: 7.5 mm Number of attempts: 1 Airway Equipment and Method: Stylet Placement Confirmation: ETT inserted through vocal cords under direct vision,  positive ETCO2 and breath sounds checked- equal and bilateral Secured at: 23 cm Tube secured with: Tape Dental Injury: Teeth and Oropharynx as per pre-operative assessment

## 2015-08-04 NOTE — Op Note (Signed)
Preoperative diagnosis: R shoulder impingement syndrome, a.c. joint arthritis, possible labral tear   Postoperative diagnosis: Same   Procedure: R shoulder arthroscopic anterior-inferior acromioplasty, formal distal clavicle excision, debride labral tear  Surgeon: Kathalene Frames. Mayer Camel M.D.   First assistant: Leighton Parody PA-C, was present for entire procedure, was needed for retraction, operation of arthroscopic equipment, placement of dressing.  Anesthetic: R shoulder interscalene block plus general endotracheal   Estimated blood loss: Minimal   Fluid replacement: 1200 cc of crystalloid.   Indications for procedure: Patient with shoulder impingement syndrome and a.c. joint arthritis who is failed conservative measures with anti-inflammatory medicines, therapy and exercises, but did get temporary relief from a cortisone injection in the subacromial space. Because of increasing pain and weakness patient desires elective arthroscopic evaluation with acromioplasty, distal clavicle excision and we will also address any other intra-articular pathology. Risks and benefits of surgery were discussed prior to the procedure and all questions answered.   Description of procedure: Patient was identified by arm band and given preoperative IV antibiotics in the holding area, as well as interscalene block anesthetic.Marland Kitchen Patient was taken to the operating room where the appropriate anesthetic monitors were attached and general endotracheal anesthesia was induced with the patient in the supine position. Patient was then placed in the beachchair position and the R upper extremity prepped and draped in the usual sterile fashion from the wrist to the hemithorax. A time out procedure was performed. We began the operation by making standard portals 1.5 cm anterior to the acromion, 1.5 cm lateral to the junction of the middle and posterior thirds of the acromion, and 1.5 cm posterior the posterior lateral corner of the  acromion process. The inflow with gravity was placed anteriorly, the arthroscope laterally, and a 4.2 great-white sucker shaver posteriorly. The subacromial bursa was resected and we visualized the subacromial spur which was then removed with a 4.5 hooded vortex bur making 2 passes. We then visualized the arthritic a.c. joint and the inferior distal centimeter of the clavicle was resected using a 4.5 hooded vortex bur from the posterior portal. We then brought the burr anteriorly, the scope posteriorly, and the inflow laterally completing the 1 cm distal clavicle excision. Moving into the glenohumeral joint the articular and the labral cartilages were visualized and there is moderate degenerative tearing of the superior and anterior labrum and these are debridement back to a stable margin with a 3.5 mm Gator sucker shaver. At this point the shoulder was irrigated out normal saline solution the arthroscopic instruments removed and a dressing of Xerofoam 4 x 4 dressing sponges, paper tape, and sling applied the patient was then placed in supine awakened extubated and taken to the recovery without difficulty.

## 2015-08-04 NOTE — Discharge Instructions (Signed)
Surgery for Impingement Syndrome, Subacromial Decompression Subacromial decompression surgery is for patients with rotator cuff tendinitis, subacromial bursitis (inflamed, fluid-filled sac between the shoulder joint and top of the shoulder blade), or impingement syndrome (inflamed rotator cuff tendons due to pinching). Surgery is for patients with continued shoulder pain despite at least 3 months of rehabilitation treatment. The shoulder pain is so severe that it affects patients' daily activities or greatly decreases their quality of life. Patients who will benefit most from surgery are those whose shoulder bone (acromion) has a curve, hook, or bump (spur), or those who have a partial rotator cuff tear. There are 3 purposes of surgery. First, the inflamed bursa is removed. Second, the shoulder bone defect (curve, hook, spur) is removed. Third, the coracoacromial ligament is cut. This surgery is intended to reduce pain by increasing space in the area so that the rotator cuff is less likely to be pinched. REASONS NOT TO OPERATE   Infection of the shoulder joint.  Patient is unable or unwilling to complete the postoperative program. This includes keeping the shoulder in a sling or immobilizer (if open surgery is performed), or performing the needed rehabilitation.  Emotional or psychological conditions that contribute to the shoulder condition.  Patients who have rotator cuff inflammation due to other causes. This includes impingement caused by shoulder instability, weak shoulder blade muscles (scapula), shoulder arthritis, stiff or frozen shoulder, or a large os acromiale (failure of the shoulder bone growth plates to fuse properly). RISKS AND COMPLICATIONS   Infection.  Bleeding.  Injury to nerves (numbness, weakness, paralysis).  Continued or recurring pain.  Detachment of the deltoid shoulder muscle (if open surgery is performed).  Stiffness or loss of shoulder motion.  Decrease in  athletic performance.  Shoulder weakness.  Fracture of the shoulder bone.  Pain in the joint connecting the shoulder bone and collarbone.  Removal of too much or too little shoulder bone. TECHNIQUE Technique used may vary between surgeons. In general, the surgery is performed with a flexible tube and tools inserted in a few small slits near the joint (arthroscopic). It may also be completed through an open cut (incision). The goal of the procedure is to remove the bursa, remove the shoulder bone deformity, and cut the coracoacromial ligament. Electricity will be used to sear the small capillaries (cauterize) to stop small amounts of bleeding. Other tools used are an electric or motorized shaver to remove the bursa, and a small power drill (burr) to remove the deformity of the shoulder bone.  If the procedure is completed with an open incision, the surgeon will detach the deltoid shoulder muscle from the shoulder bone and cut the coracoacromial ligament. The deformity of the shoulder bone is then removed, using a saw or chisel (osteotome). A file (rasp) may be used to smooth the edges. Finally, the bursa is removed with scissors, and the deltoid muscle is reattached to the shoulder bone.  HOME CARE INSTRUCTIONS   Postoperative care depends on the surgical technique used (arthroscopic or open).  Follow the instructions given to you by your surgeon.  Keep the wound clean and dry for 10 to 14 days after surgery, especially if open surgery is performed.  Wear a sling, brace, or immobilizer as prescribed by your surgeon. This often lasts a couple days for arthroscopic procedures, or 6 to 8 weeks for open procedures, because the deltoid muscle must heal.  You will be given pain medicines by your caregiver or surgeon. Take only as much medicine as  you need.  You may be advised to perform motion exercises immediately after surgery. These may be performed at home or with a therapist.  Postoperative  rehabilitation and exercises are very important to regain motion, and later, strength. RETURN TO SPORTS   6 weeks is the minimum waiting time required before returning to play. Open procedure surgeries are often longer.  Return to sports depends on the type of sport and the position played.  A therapist must assess your strength and range of motion before athletics may be resumed. SEEK MEDICAL CARE IF:   You experience pain, numbness, or coldness in the hand.  Blue, gray, or dark color appears in the fingernails.  Any of the following occur after surgery:  Increased pain, swelling, redness, drainage of fluids, or bleeding in the affected area.  Signs of infection (headache, muscle aches, dizziness, a general ill feeling with fever).  New, unexplained symptoms develop. (Drugs used in treatment may produce side effects.) Do not eat or drink anything before surgery. Solid food makes general anesthesia more hazardous.    This information is not intended to replace advice given to you by your health care provider. Make sure you discuss any questions you have with your health care provider.   Document Released: 05/08/2005 Document Revised: 05/29/2014 Document Reviewed: 08/20/2008 Elsevier Interactive Patient Education [employer].

## 2015-08-05 ENCOUNTER — Other Ambulatory Visit: Payer: Self-pay | Admitting: *Deleted

## 2015-08-05 ENCOUNTER — Encounter (HOSPITAL_COMMUNITY): Payer: Self-pay | Admitting: Orthopedic Surgery

## 2015-08-05 ENCOUNTER — Telehealth: Payer: Self-pay | Admitting: Internal Medicine

## 2015-08-05 MED ORDER — FLECAINIDE ACETATE 150 MG PO TABS: 150.0000 mg | ORAL_TABLET | Freq: Two times a day (BID) | ORAL | Status: DC

## 2015-08-05 NOTE — Telephone Encounter (Signed)
*  STAT* If patient is at the pharmacy, call can be transferred to refill team.  Pt has spoken w/ pharmacy- stated that it had not been filled/ Please advise.   1. Which medications need to be refilled? (please list name of each medication and dose if known) flecanide 150 mg  2. Which pharmacy/location (including street and city if local pharmacy) is medication to be sent to? [facility] [place]   3. Do they need a 30 day or 90 day supply? [place]

## 2015-11-01 ENCOUNTER — Other Ambulatory Visit: Payer: Self-pay

## 2015-11-01 MED ORDER — LISINOPRIL 20 MG PO TABS: 10.0000 mg | ORAL_TABLET | Freq: Two times a day (BID) | ORAL | Status: DC

## 2016-01-20 ENCOUNTER — Encounter: Payer: Self-pay | Admitting: Internal Medicine

## 2016-02-07 ENCOUNTER — Encounter: Payer: Self-pay | Admitting: Internal Medicine

## 2016-02-07 ENCOUNTER — Ambulatory Visit (INDEPENDENT_AMBULATORY_CARE_PROVIDER_SITE_OTHER): Payer: Medicare Other | Admitting: Internal Medicine

## 2016-02-07 VITALS — BP 170/90 | HR 52 | Ht 71.0 in | Wt 211.0 lb

## 2016-02-07 DIAGNOSIS — I480 Paroxysmal atrial fibrillation: Secondary | ICD-10-CM

## 2016-02-07 NOTE — Patient Instructions (Signed)
Medication Instructions:    Your physician recommends that you continue on your current medications as directed. Please refer to the Current Medication list given to you today.  --- If you need a refill on your cardiac medications before your next appointment, please call your pharmacy. ---  Labwork:  None ordered  Testing/Procedures:  None ordered  Follow-Up:  Your physician wants you to follow-up in: 6 months for a nurse visit EKG. You will receive a reminder letter in the mail two months in advance. If you don't receive a letter, please call our office to schedule the follow-up appointment.   Your physician wants you to follow-up in: 1 year with Dr. Lovena Le.  You will receive a reminder letter in the mail two months in advance. If you don't receive a letter, please call our office to schedule the follow-up appointment.   Thank you for choosing [facility]!!

## 2016-02-07 NOTE — Progress Notes (Signed)
HPI [name] returns today for followup. He is a pleasant 75 yo man with a h/o HTN, PAF, remote h/o atrial flutter, s/p ablation, h/o atrial fib, s/p ablation, and non-compliance. He has increased his flecainide by taking 150 bid. He has done well in the interim. His atrial fib has been quiet. He has had trouble with memory.   No Known Allergies   Current Outpatient Prescriptions  Medication Sig Dispense Refill  . acetaminophen (TYLENOL) 500 MG tablet Take 500 mg by mouth every 6 (six) hours as needed (pain). Reported on 05/27/2015    . apixaban (ELIQUIS) 5 MG TABS tablet Take 1 tablet (5 mg total) by mouth 2 (two) times daily. 60 tablet 10  . flecainide (TAMBOCOR) 150 MG tablet Take 1 tablet (150 mg total) by mouth 2 (two) times daily. 60 tablet 9  . HYDROcodone-acetaminophen (NORCO) 5-325 MG tablet Take 1 tablet by mouth every 6 (six) hours as needed. 60 tablet 0  . lisinopril (PRINIVIL,ZESTRIL) 20 MG tablet Take 0.5 tablets (10 mg total) by mouth 2 (two) times daily. 90 tablet 3  . nitroGLYCERIN (NITROSTAT) 0.4 MG SL tablet Place 1 tablet (0.4 mg total) under the tongue every 5 (five) minutes as needed for chest pain (MAX 3 TABLETS). 25 tablet 11   No current facility-administered medications for this visit.    Facility-Administered Medications Ordered in Other Visits  Medication Dose Route Frequency Provider Last Rate Last Dose  . gadobenate dimeglumine (MULTIHANCE) injection 20 mL  20 mL Intravenous Once Medication Radiologist, MD         Past Medical History:  Diagnosis Date  . Arthritis    both knees  . Atrial fibrillation (HCC)    Paroxysmal, s/p PVI 11/11/10  . Atrial flutter ([place])    s/p RFCA  by GT  . Basal cell carcinoma of ear   . CAD (coronary artery disease)    a. cath 5/12: pLAD 25%, D1 25%, EF 65 % (done 2/2 abnormal myoview with inferapical  ischemia)  . Dyslipidemia   . Dysrhythmia   . HTN (hypertension)   . Hx of echocardiogram    Echo (9/15):   Mild LVH, EF 55-60%, no RWMA, normal diast function, mild MR, mod LAE, mildly increased pulmonary pressures (PASP 30 mmHg).  . Hypertension   . Shortness of breath dyspnea    has a low heart rate  . Snoring    a. sleep study 11/2012 neg for OSA; increased leg movement suspicious for primary movement d/o (ie restless leg syndrome)   . Stroke [facility])    mild stroke with  second heart ablation  . TIA (transient ischemic attack)    11/12/10 post afib ablation    ROS:   All systems reviewed and negative except as noted in the HPI.   Past Surgical History:  Procedure Laterality Date  . atrial flutter ablation     by GT  . basal cell cancer resection    . CARDIOVERSION  10/31/2011   Procedure: CARDIOVERSION;  Surgeon: Erik Artist, MD;  Location: [facility] ENDOSCOPY;  Service: Cardiovascular;  Laterality: N/A;  . CATARACT EXTRACTION, BILATERAL    . COLONOSCOPY    . EYE SURGERY     growth under lid- x2  . HEMORRHOID SURGERY    . KNEE ARTHROSCOPY Bilateral    bilateral  . SHOULDER ARTHROSCOPY Right 08/04/2015   Procedure: ARTHROSCOPY RIGHT SHOULDER;  Surgeon: Erik Pear, MD;  Location: [place];  Service: Orthopedics;  Laterality:  Right;  . TEE WITHOUT CARDIOVERSION  10/31/2011   Procedure: TRANSESOPHAGEAL ECHOCARDIOGRAM (TEE);  Surgeon: Erik Artist, MD;  Location: [facility] ENDOSCOPY;  Service: Cardiovascular;  Laterality: N/A;     Family History  Problem Relation Age of Onset  . Hypertension Mother   . Stroke Mother   . Heart disease Mother   . Arthritis Mother   . Heart attack Neg Hx      Social History   Social History  . Marital status: Divorced    Spouse name: N/A  . Number of children: 1  . Years of education: N/A   Occupational History  . 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 Smoking status: Former Smoker    Packs/day: 1.50    Years: 20.00    Types: Cigarettes    Quit date: 05/23/1975  . Smokeless tobacco: Never  Used  . Alcohol use No     Comment: has not had a drink in 9 months  . Drug use: No  . Sexual activity: Not on file   Other Topics Concern  . Not on file   Social History Narrative  . No narrative on file     BP (!) 170/90   Pulse (!) 52   Ht 5\' 11"  (1.803 m)   Wt 211 lb (95.7 kg)   BMI 29.43 kg/m   Physical Exam:  Well appearing 75 year old man, NAD HEENT: Unremarkable Neck:  6 cm JVD, no thyromegally Lymphatics:  No adenopathy Back:  No CVA tenderness Lungs:  Clear, with no wheezes, rales, or rhonchi. HEART:  Regular rate rhythm, no murmurs, no rubs, no clicks Abd:  soft, positive bowel sounds, no organomegally, no rebound, no guarding Ext:  2 plus pulses, no edema, no cyanosis, no clubbing Skin:  No rashes no nodules Neuro:  CN II through XII intact, motor grossly intact,memory clearly not intact  EKG - normal sinus rhythm with sinus bradycardia and left axis deviation.   Assess/Plan: 1. PAF - he is maintaining NSR on flecainide. His ECG looks ok. No significant QRS widening. 2. HTN - his blood pressure is elevated. He admits to sodium indiscretion.  3. Dementia/memory difficulties - he has trouble remembering the details of a trip to [place] a few weeks ago. His wife has questions about dementia treatment. 4. Sinus node dysfunction - he is asymptomatic. He will avoid AV and sinus nodal blocking drugs.  Erik [name].D.

## 2016-06-30 ENCOUNTER — Other Ambulatory Visit: Payer: Self-pay | Admitting: Internal Medicine

## 2016-07-13 ENCOUNTER — Other Ambulatory Visit: Payer: Self-pay | Admitting: Internal Medicine

## 2016-07-13 NOTE — Telephone Encounter (Signed)
Last saw Dr Lovena Le on 02/07/16, last labwork 08/04/15 Creat 1.16, weight 95.7kg, age 76, per specified criteria pt is on appropriate dosage of Eliquis 5mg  BID. Will refill rx x 6 mos.

## 2016-07-26 ENCOUNTER — Other Ambulatory Visit: Payer: Self-pay | Admitting: Family Medicine

## 2016-07-26 DIAGNOSIS — M545 Low back pain: Secondary | ICD-10-CM

## 2016-07-26 DIAGNOSIS — R109 Unspecified abdominal pain: Secondary | ICD-10-CM

## 2016-07-28 ENCOUNTER — Ambulatory Visit
Admission: RE | Admit: 2016-07-28 | Discharge: 2016-07-28 | Disposition: A | Discharge status: Home / Self Care | Payer: Medicare Other | Source: Ambulatory Visit | Attending: Family Medicine | Admitting: Family Medicine

## 2016-07-28 DIAGNOSIS — R109 Unspecified abdominal pain: Secondary | ICD-10-CM

## 2016-07-28 DIAGNOSIS — M545 Low back pain: Secondary | ICD-10-CM

## 2016-10-10 ENCOUNTER — Other Ambulatory Visit (HOSPITAL_COMMUNITY): Payer: Self-pay | Admitting: Respiratory Therapy

## 2016-10-10 DIAGNOSIS — R0600 Dyspnea, unspecified: Secondary | ICD-10-CM

## 2016-10-19 ENCOUNTER — Ambulatory Visit (HOSPITAL_COMMUNITY)
Admission: RE | Admit: 2016-10-19 | Discharge: 2016-10-19 | Disposition: A | Discharge status: Home / Self Care | Payer: Medicare Other | Source: Ambulatory Visit | Attending: Family Medicine | Admitting: Family Medicine

## 2016-10-19 DIAGNOSIS — R0600 Dyspnea, unspecified: Secondary | ICD-10-CM | POA: Insufficient documentation

## 2016-10-19 LAB — PULMONARY FUNCTION TEST
DL/VA % pred: 93 %
DL/VA: 4.33 ml/min/mmHg/L
DLCO UNC % PRED: 64 %
DLCO unc: 21.67 ml/min/mmHg
FEF 25-75 PRE: 2.67 L/s
FEF 25-75 Post: 2.89 L/sec
FEF2575-%CHANGE-POST: 8 %
FEF2575-%PRED-POST: 127 %
FEF2575-%Pred-Pre: 117 %
FEV1-%Change-Post: 2 %
FEV1-%Pred-Post: 78 %
FEV1-%Pred-Pre: 76 %
FEV1-POST: 2.48 L
FEV1-Pre: 2.42 L
FEV1FVC-%CHANGE-POST: 4 %
FEV1FVC-%PRED-PRE: 113 %
FEV6-%Change-Post: -2 %
FEV6-%PRED-POST: 70 %
FEV6-%Pred-Pre: 72 %
FEV6-Post: 2.89 L
FEV6-Pre: 2.95 L
FEV6FVC-%Pred-Post: 106 %
FEV6FVC-%Pred-Pre: 106 %
FVC-%CHANGE-POST: -2 %
FVC-%PRED-POST: 66 %
FVC-%Pred-Pre: 67 %
FVC-PRE: 2.95 L
FVC-Post: 2.89 L
POST FEV1/FVC RATIO: 86 %
Post FEV6/FVC ratio: 100 %
Pre FEV1/FVC ratio: 82 %
Pre FEV6/FVC Ratio: 100 %
RV % PRED: 97 %
RV: 2.57 L
TLC % pred: 77 %
TLC: 5.59 L

## 2016-10-19 MED ORDER — ALBUTEROL SULFATE (2.5 MG/3ML) 0.083% IN NEBU
2.5000 mg | INHALATION_SOLUTION | Freq: Once | RESPIRATORY_TRACT | Status: AC
  Administered 2016-10-19: 2.5 mg via RESPIRATORY_TRACT

## 2016-10-20 ENCOUNTER — Other Ambulatory Visit: Payer: Self-pay | Admitting: Family Medicine

## 2016-10-20 ENCOUNTER — Ambulatory Visit
Admission: RE | Admit: 2016-10-20 | Discharge: 2016-10-20 | Disposition: A | Discharge status: Home / Self Care | Payer: Medicare Other | Source: Ambulatory Visit | Attending: Family Medicine | Admitting: Family Medicine

## 2016-10-20 DIAGNOSIS — R0600 Dyspnea, unspecified: Secondary | ICD-10-CM

## 2017-01-28 ENCOUNTER — Other Ambulatory Visit: Payer: Self-pay | Admitting: Internal Medicine

## 2017-02-02 ENCOUNTER — Encounter: Payer: Self-pay | Admitting: Internal Medicine

## 2017-02-13 ENCOUNTER — Encounter (INDEPENDENT_AMBULATORY_CARE_PROVIDER_SITE_OTHER): Payer: Self-pay

## 2017-02-13 ENCOUNTER — Encounter: Payer: Self-pay | Admitting: Internal Medicine

## 2017-02-13 ENCOUNTER — Ambulatory Visit (INDEPENDENT_AMBULATORY_CARE_PROVIDER_SITE_OTHER): Payer: Medicare Other | Admitting: Internal Medicine

## 2017-02-13 VITALS — BP 128/76 | HR 56 | Ht 71.0 in | Wt 214.2 lb

## 2017-02-13 DIAGNOSIS — I480 Paroxysmal atrial fibrillation: Secondary | ICD-10-CM

## 2017-02-13 DIAGNOSIS — I10 Essential (primary) hypertension: Secondary | ICD-10-CM

## 2017-02-13 MED ORDER — BETAMETHASONE DIPROPIONATE AUG 0.05 % EX LOTN: TOPICAL_LOTION | Freq: Two times a day (BID) | CUTANEOUS | 0 refills | Status: DC

## 2017-02-13 MED ORDER — FLECAINIDE ACETATE 150 MG PO TABS
150.0000 mg | ORAL_TABLET | Freq: Two times a day (BID) | ORAL | 3 refills | Status: DC
Start: 2017-02-13 — End: 2017-08-11

## 2017-02-13 MED ORDER — APIXABAN 5 MG PO TABS: 5.0000 mg | ORAL_TABLET | Freq: Two times a day (BID) | ORAL | 11 refills | Status: DC

## 2017-02-13 MED ORDER — LISINOPRIL 20 MG PO TABS: 10.0000 mg | ORAL_TABLET | Freq: Two times a day (BID) | ORAL | 3 refills | Status: DC

## 2017-02-13 NOTE — Patient Instructions (Signed)
Medication Instructions:  Your physician has recommended you make the following change in your medication:  1.  Diprolene lotion- apply twice a day to affected area.  Labwork: None ordered.  Testing/Procedures: None ordered.  Follow-Up: Your physician wants you to follow-up in: one year with Dr. Lovena Le.   You will receive a reminder letter in the mail two months in advance. If you don't receive a letter, please call our office to schedule the follow-up appointment.   Any Other Special Instructions Will Be Listed Below (If Applicable).     If you need a refill on your cardiac medications before your next appointment, please call your pharmacy.

## 2017-02-13 NOTE — Progress Notes (Signed)
HPI [name] returns today for ongoing evaluation and management of PAF. He has done well in the interim. He has been exposed to chiggers after he was walking through the woods and has several hundred bites. He denies chest pain, sob, or syncope. He has a h/o dementia and has been started on Aricept. No syncope. No Known Allergies   Current Outpatient Prescriptions  Medication Sig Dispense Refill  . acetaminophen (TYLENOL) 500 MG tablet Take 500 mg by mouth every 6 (six) hours as needed (pain). Reported on 05/27/2015    . donepezil (ARICEPT) 10 MG tablet Take 10 mg by mouth at bedtime.  0  . ELIQUIS 5 MG TABS tablet take 1 tablet by mouth twice a day 60 tablet 1  . flecainide (TAMBOCOR) 150 MG tablet take 1 tablet by mouth twice a day 60 tablet 11  . fluorouracil (EFUDEX) 5 % cream apply to affected area twice a day for 1-2 WEEKS  0  . HYDROcodone-acetaminophen (NORCO) 5-325 MG tablet Take 1 tablet by mouth every 6 (six) hours as needed. 60 tablet 0  . INCRUSE ELLIPTA 62.5 MCG/INH AEPB Take 1 puff by mouth daily.  1  . lisinopril (PRINIVIL,ZESTRIL) 20 MG tablet Take 0.5 tablets (10 mg total) by mouth 2 (two) times daily. 90 tablet 0  . nitroGLYCERIN (NITROSTAT) 0.4 MG SL tablet Place 1 tablet (0.4 mg total) under the tongue every 5 (five) minutes as needed for chest pain (MAX 3 TABLETS). 25 tablet 11   No current facility-administered medications for this visit.    Facility-Administered Medications Ordered in Other Visits  Medication Dose Route Frequency Provider Last Rate Last Dose  . gadobenate dimeglumine (MULTIHANCE) injection 20 mL  20 mL Intravenous Once Radiologist, Medication, MD         Past Medical History:  Diagnosis Date  . Arthritis    both knees  . Atrial fibrillation (HCC)    Paroxysmal, s/p PVI 11/11/10  . Atrial flutter ([place])    s/p RFCA  by GT  . Basal cell carcinoma of ear   . CAD (coronary artery disease)    a. cath 5/12: pLAD 25%, D1 25%, EF 65 %  (done 2/2 abnormal myoview with inferapical  ischemia)  . Dyslipidemia   . Dysrhythmia   . HTN (hypertension)   . Hx of echocardiogram    Echo (9/15):  Mild LVH, EF 55-60%, no RWMA, normal diast function, mild MR, mod LAE, mildly increased pulmonary pressures (PASP 30 mmHg).  . Hypertension   . Shortness of breath dyspnea    has a low heart rate  . Snoring    a. sleep study 11/2012 neg for OSA; increased leg movement suspicious for primary movement d/o (ie restless leg syndrome)   . Stroke [facility])    mild stroke with  second heart ablation  . TIA (transient ischemic attack)    11/12/10 post afib ablation    ROS:   All systems reviewed and negative except as noted in the HPI.   Past Surgical History:  Procedure Laterality Date  . atrial flutter ablation     by GT  . basal cell cancer resection    . CARDIOVERSION  10/31/2011   Procedure: CARDIOVERSION;  Surgeon: Jolaine Artist, MD;  Location[facility] ENDOSCOPY;  Service: Cardiovascular;  Laterality: N/A;  . CATARACT EXTRACTION, BILATERAL    . COLONOSCOPY    . EYE SURGERY     growth under lid- x2  . HEMORRHOID SURGERY    .  KNEE ARTHROSCOPY Bilateral    bilateral  . SHOULDER ARTHROSCOPY Right 08/04/2015   Procedure: ARTHROSCOPY RIGHT SHOULDER;  Surgeon: Frederik Pear, MD;  Location: [place];  Service: Orthopedics;  Laterality: Right;  . TEE WITHOUT CARDIOVERSION  10/31/2011   Procedure: TRANSESOPHAGEAL ECHOCARDIOGRAM (TEE);  Surgeon: Jolaine Artist, MD;  Location: [facility] ENDOSCOPY;  Service: Cardiovascular;  Laterality: N/A;     Family History  Problem Relation Age of Onset  . Hypertension Mother   . Stroke Mother   . Heart disease Mother   . Arthritis Mother   . Heart attack Neg Hx      Social History   Social History  . Marital status: Divorced    Spouse name: N/A  . Number of children: 1  . Years of education: N/A   Occupational History  . 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 Smoking status: Former Smoker    Packs/day: 1.50    Years: 20.00    Types: Cigarettes    Quit date: 05/23/1975  . Smokeless tobacco: Never Used  . Alcohol use No     Comment: has not had a drink in 9 months  . Drug use: No  . Sexual activity: Not on file   Other Topics Concern  . Not on file   Social History Narrative  . No narrative on file     BP 128/76   Pulse (!) 56   Ht 5\' 11"  (1.803 m)   Wt 214 lb 3.2 oz (97.2 kg)   SpO2 97%   BMI 29.87 kg/m   Physical Exam:  Well appearing 76 yo man, NAD HEENT: Unremarkable Neck:  6 cm JVD, no thyromegally Lymphatics:  No adenopathy Back:  No CVA tenderness Lungs:  Clear with no wheezes HEART:  Regular rate rhythm, no murmurs, no rubs, no clicks Abd:  soft, positive bowel sounds, no organomegally, no rebound, no guarding Ext:  2 plus pulses, no edema, no cyanosis, no clubbing Skin:  Hundreds of erythematous nodules are present on the legs and torso Neuro:  CN II through XII intact, motor grossly intact  EKG - sinus bradycardia with LVH  Assess/Plan: 1. PAF - he appears to be maintaining NSR very nicely. He will continue his current meds. 2. HTN - his blood pressure today is well controlled. He will continue his current meds. 3. Chigger bites - he has hundreds of intensely pruritic erythematous nodules and I have recommended a topical steroid. 4. Sinus node dysfunction - he remains asymptomatic. He has had no syncope.  Erik [name].D.

## 2017-05-08 ENCOUNTER — Other Ambulatory Visit: Payer: Self-pay | Admitting: Family Medicine

## 2017-05-08 DIAGNOSIS — R42 Dizziness and giddiness: Secondary | ICD-10-CM

## 2017-05-08 DIAGNOSIS — R413 Other amnesia: Secondary | ICD-10-CM

## 2017-05-09 ENCOUNTER — Other Ambulatory Visit: Payer: Self-pay | Admitting: Family Medicine

## 2017-05-09 DIAGNOSIS — R1011 Right upper quadrant pain: Secondary | ICD-10-CM

## 2017-05-09 DIAGNOSIS — R109 Unspecified abdominal pain: Secondary | ICD-10-CM

## 2017-05-10 ENCOUNTER — Ambulatory Visit
Admission: RE | Admit: 2017-05-10 | Discharge: 2017-05-10 | Disposition: A | Discharge status: Home / Self Care | Payer: Medicare Other | Source: Ambulatory Visit | Attending: Family Medicine | Admitting: Family Medicine

## 2017-05-10 DIAGNOSIS — R109 Unspecified abdominal pain: Secondary | ICD-10-CM

## 2017-05-10 DIAGNOSIS — R1011 Right upper quadrant pain: Secondary | ICD-10-CM

## 2017-05-23 ENCOUNTER — Ambulatory Visit
Admission: RE | Admit: 2017-05-23 | Discharge: 2017-05-23 | Disposition: A | Discharge status: Home / Self Care | Payer: Medicare Other | Source: Ambulatory Visit | Attending: Family Medicine | Admitting: Family Medicine

## 2017-05-23 DIAGNOSIS — R413 Other amnesia: Secondary | ICD-10-CM

## 2017-05-23 DIAGNOSIS — R42 Dizziness and giddiness: Secondary | ICD-10-CM

## 2017-05-23 MED ORDER — GADOBENATE DIMEGLUMINE 529 MG/ML IV SOLN
20.0000 mL | Freq: Once | INTRAVENOUS | Status: AC | PRN
  Administered 2017-05-23: 20 mL via INTRAVENOUS

## 2017-06-17 NOTE — Progress Notes (Addendum)
Cardiology Office Note Date:  06/19/2017  Patient ID:  Erik [name], DOB 1941-03-05, MRN [id number] PCP:  Aurea Graff.Marlou Sa, MD  Electrophysiologist: Dr. Lovena Le   Chief Complaint: evaluate BP tx, pcp referred  History of Present Illness: Erik [name] is a 77 y.o. male with history of PAFlutter ablated, PAFib,  Sinus node dysfunction, non-obstructive CAD by cath in 2012, HLD, HTN, CVA, TIA.  He comes today to be seen for Dr. Lovena Le, last seen by him in Sep 2018, at that time he had unfortunately suffered hundreds of bites/chiggers, otherwise was doing well, maintaining SR.  The patient is accompanied by his wife. I read that he has started to have some memory issues and he was started on Aricept just under a year ago.  The patient describes moment of feeling lightheaded, not so much pressure or discomfort but a feeling like he needs to stop and grab something to collect himself, resolves quickly, no syncope.  His wife states his PMD did a brain MRI, labs, and abd [place] as well for some abdominal complaints all of which reportedly were normal.   He has years of DOE, this is ongoing and unchanged, though of late maybe the last year even worse especially with inclines and has avoided them.  I don't get that he is having CP, but he says it is especially hard to breath with inclines.  He c/o burning pain bottom of his feet and left 4/5th toes feel numb especially at [name].  He denies any bleeding or signs of bleeding.  He is not great at taking his medicines, will forget evenings a couple nights a week it sounds like.  His wife offers that he has started drinking again.  The patient admits 1 drink at [name] though I suspect more by his wife's body language.    Past Medical History:  Diagnosis Date  . Arthritis    both knees  . Atrial fibrillation (HCC)    Paroxysmal, s/p PVI 11/11/10  . Atrial flutter ([place])    s/p RFCA  by GT  . Basal cell carcinoma of ear   . CAD (coronary artery disease)     a. cath 5/12: pLAD 25%, D1 25%, EF 65 % (done 2/2 abnormal myoview with inferapical  ischemia)  . Dyslipidemia   . Dysrhythmia   . HTN (hypertension)   . Hx of echocardiogram    Echo (9/15):  Mild LVH, EF 55-60%, no RWMA, normal diast function, mild MR, mod LAE, mildly increased pulmonary pressures (PASP 30 mmHg).  . Hypertension   . Shortness of breath dyspnea    has a low heart rate  . Snoring    a. sleep study 11/2012 neg for OSA; increased leg movement suspicious for primary movement d/o (ie restless leg syndrome)   . Stroke [facility])    mild stroke with  second heart ablation  . TIA (transient ischemic attack)    11/12/10 post afib ablation    Past Surgical History:  Procedure Laterality Date  . atrial flutter ablation     by GT  . basal cell cancer resection    . CARDIOVERSION  10/31/2011   Procedure: CARDIOVERSION;  Surgeon: Jolaine Artist, MD;  Location: [facility] ENDOSCOPY;  Service: Cardiovascular;  Laterality: N/A;  . CATARACT EXTRACTION, BILATERAL    . COLONOSCOPY    . EYE SURGERY     growth under lid- x2  . HEMORRHOID SURGERY    . KNEE ARTHROSCOPY Bilateral    bilateral  .  SHOULDER ARTHROSCOPY Right 08/04/2015   Procedure: ARTHROSCOPY RIGHT SHOULDER;  Surgeon: Frederik Pear, MD;  Location: [place];  Service: Orthopedics;  Laterality: Right;  . TEE WITHOUT CARDIOVERSION  10/31/2011   Procedure: TRANSESOPHAGEAL ECHOCARDIOGRAM (TEE);  Surgeon: Jolaine Artist, MD;  Location: [facility] ENDOSCOPY;  Service: Cardiovascular;  Laterality: N/A;    Current Outpatient Medications  Medication Sig Dispense Refill  . acetaminophen (TYLENOL) 500 MG tablet Take 500 mg by mouth every 6 (six) hours as needed (pain). Reported on 05/27/2015    . apixaban (ELIQUIS) 5 MG TABS tablet Take 1 tablet (5 mg total) by mouth 2 (two) times daily. 60 tablet 11  . donepezil (ARICEPT) 10 MG tablet Take 10 mg by mouth at bedtime.  0  . flecainide (TAMBOCOR) 150 MG tablet Take 1 tablet (150 mg total) by mouth 2  (two) times daily. 180 tablet 3  . fluorouracil (EFUDEX) 5 % cream apply to affected area twice a day for 1-2 WEEKS  0  . INCRUSE ELLIPTA 62.5 MCG/INH AEPB Take 1 puff by mouth daily.  1  . lisinopril (PRINIVIL,ZESTRIL) 20 MG tablet Take 0.5 tablets (10 mg total) by mouth 2 (two) times daily. 90 tablet 3  . nitroGLYCERIN (NITROSTAT) 0.4 MG SL tablet Place 1 tablet (0.4 mg total) under the tongue every 5 (five) minutes as needed for chest pain (MAX 3 TABLETS). 25 tablet 11   No current facility-administered medications for this visit.    Facility-Administered Medications Ordered in Other Visits  Medication Dose Route Frequency Provider Last Rate Last Dose  . gadobenate dimeglumine (MULTIHANCE) injection 20 mL  20 mL Intravenous Once Radiologist, Medication, MD        Allergies:   Patient has no known allergies.   Social History:  The patient  reports that he quit smoking about 42 years ago. His smoking use included cigarettes. He has a 30.00 pack-year smoking history. he has never used smokeless tobacco. He reports that he does not drink alcohol or use drugs.   Family History:  The patient's family history includes Arthritis in his mother; Heart disease in his mother; Hypertension in his mother; Stroke in his mother.  ROS:  Please see the history of present illness.  All other systems are reviewed and otherwise negative.   PHYSICAL EXAM:  VS:  BP (!) 160/84   Pulse 60   Ht 5\' 11"  (1.803 m)   Wt 219 lb (99.3 kg)   BMI 30.54 kg/m  BMI: Body mass index is 30.54 kg/m. Well nourished, well developed, in no acute distress  HEENT: normocephalic, atraumatic  Neck: no JVD, carotid bruits or masses Cardiac:  RRR; no significant murmurs, no rubs, or gallops Lungs:  CTA b/l, no wheezing, rhonchi or rales  Abd: soft, nontender MS: no deformity or atrophy Ext: no edema, 3+ easily palpable pedal pulses b/l, no skin changes, no cyanosis Skin: warm and dry, no rash Neuro:  No gross deficits  appreciated Psych: euthymic mood, full affect    EKG:  Done today and reviewed by myself SB 56bpm, 1st degree AVblock, PR 270ms, QRS 124ms, QTc 48ms  01/27/14: TTE Study Conclusions - Left ventricle: The cavity size was normal. Wall thickness was increased in a pattern of mild LVH. Systolic function was normal. The estimated ejection fraction was in the range of 55% to 60%. Wall motion was normal; there were no regional wall motion abnormalities. Left ventricular diastolic function parameters were normal. - Mitral valve: There was mild regurgitation. - Left atrium:  The atrium was moderately dilated. - Pulmonary arteries: Systolic pressure was mildly increased Impressions - Normal LV function; moderate LAE; mild MR and TR; mildly elevated pulmonary pressures.  Recent Labs: No results found for requested labs within last 8760 hours.  No results found for requested labs within last 8760 hours.   CrCl cannot be calculated (Patient's most recent lab result is older than the maximum 21 days allowed.).   Wt Readings from Last 3 Encounters:  06/19/17 219 lb (99.3 kg)  02/13/17 214 lb 3.2 oz (97.2 kg)  02/07/16 211 lb (95.7 kg)     Other studies reviewed: Additional studies/records reviewed today include: summarized above  ASSESSMENT AND PLAN:  1. Paroxysmal Afib     CHA2DS2Vasc is 5, on Eliquis, appropriately dosed by last available labs, will request his last labs from PMD office     On Flecainide     In review of Dr. Tanna Furry notes, with sinus node dysfunction to avoid AV and sinus nodal blocking agents     EKG intervals look stable   2. Fleeting dizzy spells, worsening DOE  I see he had a CPX test in 2012 noting "a clear limitation to the exercise due to his chronotropic incompetence. Per technician, EKG showed possible development of an AV Block/Junctional rhythm"  Start with a 2 week monitor since he reports a few episodes a week and an echo.  I discussed  getting a stress test/ETT, but he is reluctant stating he has historically has been unable to walk the treadmill.  Discussed reducing his flecainide, though on lower doses he had breakthrough AF apparently.  Aricept ?  Discussed with Dr. Lovena Le, he may be nearing/at the point of pacing, agrees with plan, will see him in f/u.   3. HTN     No changes for now   4. I suspect a fair amount of ETOH     Discussed this and counseled, I don't know that he was all that receptive, his wife very supporitve of cessation states he had quit  dinking once before, unfortunately picked it back up   Disposition: F/u in 3-4 weeks, sooner if needed  Current medicines are reviewed at length with the patient today.  The patient did not have any concerns regarding medicines.  Erik Night, PA-C 06/19/2017 4:16 PM     [place] [address] [phone] (office)  [phone] (fax)

## 2017-06-19 ENCOUNTER — Encounter (INDEPENDENT_AMBULATORY_CARE_PROVIDER_SITE_OTHER): Payer: Self-pay

## 2017-06-19 ENCOUNTER — Ambulatory Visit: Payer: Medicare Other | Admitting: Physician Assistant

## 2017-06-19 VITALS — BP 160/84 | HR 60 | Ht 71.0 in | Wt 219.0 lb

## 2017-06-19 DIAGNOSIS — R42 Dizziness and giddiness: Secondary | ICD-10-CM | POA: Diagnosis not present

## 2017-06-19 DIAGNOSIS — Z79899 Other long term (current) drug therapy: Secondary | ICD-10-CM

## 2017-06-19 DIAGNOSIS — R0602 Shortness of breath: Secondary | ICD-10-CM | POA: Diagnosis not present

## 2017-06-19 DIAGNOSIS — I10 Essential (primary) hypertension: Secondary | ICD-10-CM | POA: Diagnosis not present

## 2017-06-19 DIAGNOSIS — R001 Bradycardia, unspecified: Secondary | ICD-10-CM | POA: Diagnosis not present

## 2017-06-19 NOTE — Patient Instructions (Addendum)
Medication Instructions:   Your physician recommends that you continue on your current medications as directed. Please refer to the Current Medication list given to you today.   If you need a refill on your cardiac medications before your next appointment, please call your pharmacy.  Labwork: NONE ORDERED  TODAY    Testing/Procedures: Your physician has recommended that you wear an event monitor. Event monitors are medical devices that record the heart's electrical activity. Doctors most often [place] these monitors to diagnose arrhythmias. Arrhythmias are problems with the speed or rhythm of the heartbeat. The monitor is a small, portable device. You can wear one while you do your normal daily activities. This is usually used to diagnose what is causing palpitations/syncope (passing out).  Your physician has requested that you have an echocardiogram. Echocardiography is a painless test that uses sound waves to create images of your heart. It provides your doctor with information about the size and shape of your heart and how well your heart's chambers and valves are working. This procedure takes approximately one hour. There are no restrictions for this procedure.     Follow-Up: IN 3 TO 4 WEEKS WITH URSUY    Any Other Special Instructions Will Be Listed Below (If Applicable).

## 2017-07-02 ENCOUNTER — Ambulatory Visit (INDEPENDENT_AMBULATORY_CARE_PROVIDER_SITE_OTHER): Payer: Medicare Other

## 2017-07-02 ENCOUNTER — Ambulatory Visit (HOSPITAL_COMMUNITY): Payer: Medicare Other | Attending: Cardiology

## 2017-07-02 ENCOUNTER — Other Ambulatory Visit: Payer: Self-pay

## 2017-07-02 DIAGNOSIS — R0602 Shortness of breath: Secondary | ICD-10-CM | POA: Insufficient documentation

## 2017-07-02 DIAGNOSIS — R42 Dizziness and giddiness: Secondary | ICD-10-CM | POA: Diagnosis not present

## 2017-07-16 NOTE — Progress Notes (Deleted)
Cardiology Office Note Date:  07/16/2017  Patient ID:  Erik [name], DOB 01/07/1941, MRN [id number] PCP:  Aurea Graff.Marlou Sa, MD  Electrophysiologist: Dr. Lovena Le   *** refresh  Chief Complaint: planned f/u on testing, symptoms  History of Present Illness: [name] is a 77 y.o. male with history of PAFlutter ablated, PAFib,  Sinus node dysfunction, non-obstructive CAD by cath in 2012, HLD, HTN, CVA, TIA.  He comes today to be seen for Dr. Lovena Le, last seen by him in Sep 2018, at that time he had unfortunately suffered hundreds of bites/chiggers, otherwise was doing well, maintaining SR.  I saw the patient 06/19/17, he was accompanied by his wife. He was referred back to [place] by PMD for evaluation of HTN.  I read that he has started to have some memory issues and he was started on Aricept just under a year ago.  The patient described moments of feeling lightheaded, not so much head pressure or discomfort but a feeling like he needs to stop and grab something to collect himself, resolved quickly, no syncope.  His wife stated his PMD did a brain MRI, labs, and abd [place] as well for some abdominal complaints all of which reportedly were normal.   He has years of DOE, this was ongoing and unchanged, though of late maybe the last year even worse especially with inclines and has avoided them.  I did not get that he was having CP, but he says it is especially hard to breath with inclines.  He c/o burning pain bottom of his feet and left 4/5th toes feel numb especially at night.  He denied any bleeding or signs of bleeding.  He reportedly was not great at taking his medicines, will forget evenings a couple nights a week it sounds like.  His wife offers that he had started drinking again.  The patient admitted to 1 drink at night though I suspected more by his wife's body language, he was counseled.  In review of his record, he had a CPX test in 2012 noting "a clear limitation to the exercise due to  his chronotropic incompetence. Per technician, EKG showed possible development of an AV Block/Junctional rhythm"  We decided to start with a 2 week monitor since he reports a few episodes a week and an echo.  I discussed/recommended getting a stress test/ETT, but he was reluctant stating he has historically has been unable to walk the treadmill.  Discussed reducing his flecainide, though on lower doses he had breakthrough AF apparently, and no change was made.  I discussed with Dr. Lovena Le, he may be nearing/at the point of pacing, agrees with plan, and to see him in f/u with testing results.     ***   Past Medical History:  Diagnosis Date  . Arthritis    both knees  . Atrial fibrillation (HCC)    Paroxysmal, s/p PVI 11/11/10  . Atrial flutter ([place])    s/p RFCA  by GT  . Basal cell carcinoma of ear   . CAD (coronary artery disease)    a. cath 5/12: pLAD 25%, D1 25%, EF 65 % (done 2/2 abnormal myoview with inferapical  ischemia)  . Dyslipidemia   . Dysrhythmia   . HTN (hypertension)   . Hx of echocardiogram    Echo (9/15):  Mild LVH, EF 55-60%, no RWMA, normal diast function, mild MR, mod LAE, mildly increased pulmonary pressures (PASP 30 mmHg).  . Hypertension   . Shortness of breath dyspnea  has a low heart rate  . Snoring    a. sleep study 11/2012 neg for OSA; increased leg movement suspicious for primary movement d/o (ie restless leg syndrome)   . Stroke [facility])    mild stroke with  second heart ablation  . TIA (transient ischemic attack)    11/12/10 post afib ablation    Past Surgical History:  Procedure Laterality Date  . atrial flutter ablation     by GT  . basal cell cancer resection    . CARDIOVERSION  10/31/2011   Procedure: CARDIOVERSION;  Surgeon: Jolaine Artist, MD;  Location: [facility] ENDOSCOPY;  Service: Cardiovascular;  Laterality: N/A;  . CATARACT EXTRACTION, BILATERAL    . COLONOSCOPY    . EYE SURGERY     growth under lid- x2  . HEMORRHOID SURGERY    . KNEE  ARTHROSCOPY Bilateral    bilateral  . SHOULDER ARTHROSCOPY Right 08/04/2015   Procedure: ARTHROSCOPY RIGHT SHOULDER;  Surgeon: Frederik Pear, MD;  Location: [place];  Service: Orthopedics;  Laterality: Right;  . TEE WITHOUT CARDIOVERSION  10/31/2011   Procedure: TRANSESOPHAGEAL ECHOCARDIOGRAM (TEE);  Surgeon: Jolaine Artist, MD;  Location: [facility] ENDOSCOPY;  Service: Cardiovascular;  Laterality: N/A;    Current Outpatient Medications  Medication Sig Dispense Refill  . acetaminophen (TYLENOL) 500 MG tablet Take 500 mg by mouth every 6 (six) hours as needed (pain). Reported on 05/27/2015    . apixaban (ELIQUIS) 5 MG TABS tablet Take 1 tablet (5 mg total) by mouth 2 (two) times daily. 60 tablet 11  . donepezil (ARICEPT) 10 MG tablet Take 10 mg by mouth at bedtime.  0  . flecainide (TAMBOCOR) 150 MG tablet Take 1 tablet (150 mg total) by mouth 2 (two) times daily. 180 tablet 3  . fluorouracil (EFUDEX) 5 % cream apply to affected area twice a day for 1-2 WEEKS  0  . INCRUSE ELLIPTA 62.5 MCG/INH AEPB Take 1 puff by mouth daily.  1  . lisinopril (PRINIVIL,ZESTRIL) 20 MG tablet Take 0.5 tablets (10 mg total) by mouth 2 (two) times daily. 90 tablet 3  . nitroGLYCERIN (NITROSTAT) 0.4 MG SL tablet Place 1 tablet (0.4 mg total) under the tongue every 5 (five) minutes as needed for chest pain (MAX 3 TABLETS). 25 tablet 11   No current facility-administered medications for this visit.    Facility-Administered Medications Ordered in Other Visits  Medication Dose Route Frequency Provider Last Rate Last Dose  . gadobenate dimeglumine (MULTIHANCE) injection 20 mL  20 mL Intravenous Once Radiologist, Medication, MD        Allergies:   Patient has no known allergies.   Social History:  The patient  reports that he quit smoking about 42 years ago. His smoking use included cigarettes. He has a 30.00 pack-year smoking history. he has never used smokeless tobacco. He reports that he does not drink alcohol or use drugs.     Family History:  The patient's family history includes Arthritis in his mother; Heart disease in his mother; Hypertension in his mother; Stroke in his mother.  ROS:  Please see the history of present illness.  All other systems are reviewed and otherwise negative.   PHYSICAL EXAM:  VS:  There were no vitals taken for this visit. BMI: There is no height or weight on file to calculate BMI. Well nourished, well developed, in no acute distress  HEENT: normocephalic, atraumatic  Neck: no JVD, carotid bruits or masses Cardiac:  *** RRR; no significant murmurs, no  rubs, or gallops Lungs:  *** CTA b/l, no wheezing, rhonchi or rales  Abd: soft, nontender MS: no deformity or atrophy Ext: *** no edema, 3+ easily palpable pedal pulses b/l, no skin changes, no cyanosis Skin: warm and dry, no rash Neuro:  No gross deficits appreciated Psych: euthymic mood, full affect    EKG:  Done 06/19/17 and reviewed by myself SB 56bpm, 1st degree AVblock, PR 226ms, QRS 166ms, QTc 452ms  07/02/17: event monitor: *** result pending  07/02/17: TTE Study Conclusions - Left ventricle: The cavity size was mildly dilated. Wall   thickness was increased in a pattern of mild LVH. Systolic   function was normal. The estimated ejection fraction was in the   range of 55% to 60%. Features are consistent with a pseudonormal   left ventricular filling pattern, with concomitant abnormal   relaxation and increased filling pressure (grade 2 diastolic   dysfunction). Doppler parameters are consistent with high   ventricular filling pressure. - Aortic valve: There was trivial regurgitation. - Mitral valve: Calcified annulus. Mildly thickened leaflets .   There was mild regurgitation. - Left atrium: The atrium was mildly dilated. - Pulmonary arteries: PA peak pressure: 35 mm Hg (S).   01/27/14: TTE Study Conclusions - Left ventricle: The cavity size was normal. Wall thickness was increased in a pattern of mild LVH.  Systolic function was normal. The estimated ejection fraction was in the range of 55% to 60%. Wall motion was normal; there were no regional wall motion abnormalities. Left ventricular diastolic function parameters were normal. - Mitral valve: There was mild regurgitation. - Left atrium: The atrium was moderately dilated. - Pulmonary arteries: Systolic pressure was mildly increased Impressions - Normal LV function; moderate LAE; mild MR and TR; mildly elevated pulmonary pressures.  Recent Labs: No results found for requested labs within last 8760 hours.  No results found for requested labs within last 8760 hours.   CrCl cannot be calculated (Patient's most recent lab result is older than the maximum 21 days allowed.).   Wt Readings from Last 3 Encounters:  06/19/17 219 lb (99.3 kg)  02/13/17 214 lb 3.2 oz (97.2 kg)  02/07/16 211 lb (95.7 kg)     Other studies reviewed: Additional studies/records reviewed today include: summarized above  ASSESSMENT AND PLAN:  1. Paroxysmal Afib     CHA2DS2Vasc is 5, on Eliquis, appropriately dosed by last available labs, will request his last labs from PMD office     On Flecainide     In review of Dr. Tanna Furry notes, with sinus node dysfunction to avoid AV and sinus nodal blocking agents     *** EKG intervals look stable   2. Fleeting dizzy spells, worsening DOE     *** suspect chronotropic incompetence   3. HTN     *** No changes for now   4. I suspect a fair amount of ETOH     *** Discussed this and counseled, I don't know that he was all that receptive, his wife very supporitve of cessation states he had quit  dinking once before, unfortunately picked it back up   Disposition:***   Current medicines are reviewed at length with the patient today.  The patient did not have any concerns regarding medicines.  Venetia Night, PA-C 07/16/2017 8:45 AM     [facility] [address] [place] [address] [phone] (office)  [phone] (fax)

## 2017-07-17 ENCOUNTER — Ambulatory Visit: Payer: Medicare Other | Admitting: Physician Assistant

## 2017-07-17 ENCOUNTER — Encounter: Payer: Self-pay | Admitting: *Deleted

## 2017-07-17 NOTE — Progress Notes (Signed)
Patient ID: Erik [name], male   DOB: [birth year], 77 y.o.   MRN: [id number] Patient returned [employer] MCT-1 patch cardiac event monitor to our office.It was shipped from our office using the enclosed [place] from [place].

## 2017-07-24 ENCOUNTER — Ambulatory Visit: Payer: Medicare Other | Admitting: Internal Medicine

## 2017-07-24 ENCOUNTER — Encounter: Payer: Self-pay | Admitting: Internal Medicine

## 2017-07-24 VITALS — BP 156/88 | HR 51 | Ht 71.0 in | Wt 219.0 lb

## 2017-07-24 DIAGNOSIS — R42 Dizziness and giddiness: Secondary | ICD-10-CM | POA: Diagnosis not present

## 2017-07-24 DIAGNOSIS — R001 Bradycardia, unspecified: Secondary | ICD-10-CM

## 2017-07-24 DIAGNOSIS — R0602 Shortness of breath: Secondary | ICD-10-CM | POA: Diagnosis not present

## 2017-07-24 DIAGNOSIS — I10 Essential (primary) hypertension: Secondary | ICD-10-CM

## 2017-07-24 DIAGNOSIS — I480 Paroxysmal atrial fibrillation: Secondary | ICD-10-CM

## 2017-07-24 NOTE — Patient Instructions (Addendum)
Medication Instructions:  Your physician has recommended you make the following change in your medication:  1.  Discontinue flecainide  Labwork: None ordered.  Testing/Procedures: None ordered.  Follow-Up: Your physician wants you to follow-up in: 6 weeks with Dr. Lovena Le.     Any Other Special Instructions Will Be Listed Below (If Applicable).  If you need a refill on your cardiac medications before your next appointment, please call your pharmacy.

## 2017-07-24 NOTE — Progress Notes (Signed)
HPI [name] returns today for ongoing evaluation and management of his PAF, sinus node dysfunction, and HTN. He has a h/o dementia. He has c/o pain in his feet, bilaterally as well as his left hand although there is no relation to time of day or activity. He has not had much in the way of palpitations. No syncope. He has a h/o ETOH abuse but denies this presently. Also denies medical non-compliance. No Known Allergies   Current Outpatient Medications  Medication Sig Dispense Refill  . acetaminophen (TYLENOL) 500 MG tablet Take 500 mg by mouth every 6 (six) hours as needed (pain). Reported on 05/27/2015    . apixaban (ELIQUIS) 5 MG TABS tablet Take 1 tablet (5 mg total) by mouth 2 (two) times daily. 60 tablet 11  . donepezil (ARICEPT) 10 MG tablet Take 10 mg by mouth at bedtime.  0  . flecainide (TAMBOCOR) 150 MG tablet Take 1 tablet (150 mg total) by mouth 2 (two) times daily. 180 tablet 3  . INCRUSE ELLIPTA 62.5 MCG/INH AEPB Take 1 puff by mouth daily.  1  . lisinopril (PRINIVIL,ZESTRIL) 20 MG tablet Take 0.5 tablets (10 mg total) by mouth 2 (two) times daily. 90 tablet 3  . nitroGLYCERIN (NITROSTAT) 0.4 MG SL tablet Place 1 tablet (0.4 mg total) under the tongue every 5 (five) minutes as needed for chest pain (MAX 3 TABLETS). 25 tablet 11   No current facility-administered medications for this visit.    Facility-Administered Medications Ordered in Other Visits  Medication Dose Route Frequency Provider Last Rate Last Dose  . gadobenate dimeglumine (MULTIHANCE) injection 20 mL  20 mL Intravenous Once Radiologist, Medication, MD         Past Medical History:  Diagnosis Date  . Arthritis    both knees  . Atrial fibrillation (HCC)    Paroxysmal, s/p PVI 11/11/10  . Atrial flutter ([place])    s/p RFCA  by GT  . Basal cell carcinoma of ear   . CAD (coronary artery disease)    a. cath 5/12: pLAD 25%, D1 25%, EF 65 % (done 2/2 abnormal myoview with inferapical  ischemia)  .  Dyslipidemia   . Dysrhythmia   . HTN (hypertension)   . Hx of echocardiogram    Echo (9/15):  Mild LVH, EF 55-60%, no RWMA, normal diast function, mild MR, mod LAE, mildly increased pulmonary pressures (PASP 30 mmHg).  . Hypertension   . Shortness of breath dyspnea    has a low heart rate  . Snoring    a. sleep study 11/2012 neg for OSA; increased leg movement suspicious for primary movement d/o (ie restless leg syndrome)   . Stroke [facility])    mild stroke with  second heart ablation  . TIA (transient ischemic attack)    11/12/10 post afib ablation    ROS:   All systems reviewed and negative except as noted in the HPI.   Past Surgical History:  Procedure Laterality Date  . atrial flutter ablation     by GT  . basal cell cancer resection    . CARDIOVERSION  10/31/2011   Procedure: CARDIOVERSION;  Surgeon: Jolaine Artist, MD;  Location: [facility] ENDOSCOPY;  Service: Cardiovascular;  Laterality: N/A;  . CATARACT EXTRACTION, BILATERAL    . COLONOSCOPY    . EYE SURGERY     growth under lid- x2  . HEMORRHOID SURGERY    . KNEE ARTHROSCOPY Bilateral    bilateral  . SHOULDER ARTHROSCOPY  Right 08/04/2015   Procedure: ARTHROSCOPY RIGHT SHOULDER;  Surgeon: Frederik Pear, MD;  Location: [place];  Service: Orthopedics;  Laterality: Right;  . TEE WITHOUT CARDIOVERSION  10/31/2011   Procedure: TRANSESOPHAGEAL ECHOCARDIOGRAM (TEE);  Surgeon: Jolaine Artist, MD;  Location: [facility] ENDOSCOPY;  Service: Cardiovascular;  Laterality: N/A;     Family History  Problem Relation Age of Onset  . Hypertension Mother   . Stroke Mother   . Heart disease Mother   . Arthritis Mother   . Heart attack Neg Hx      Social History   Socioeconomic History  . Marital status: Divorced    Spouse name: Not on file  . Number of children: 1  . Years of education: Not on file  . Highest education level: Not on file  Social Needs  . Financial resource strain: Not on file  . Food insecurity - worry: Not on file  .  Food insecurity - inability: Not on file  . Transportation needs - medical: Not on file  . Transportation needs - non-medical: Not on file  Occupational History  . 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 Smoking status: Former Smoker    Packs/day: 1.50    Years: 20.00    Pack years: 30.00    Types: Cigarettes    Last attempt to quit: 05/23/1975    Years since quitting: 42.2  . Smokeless tobacco: Never Used  Substance and Sexual Activity  . Alcohol use: No    Alcohol/week: 7.0 oz    Types: 14 Standard drinks or equivalent per week    Comment: has not had a drink in 9 months  . Drug use: No  . Sexual activity: Not on file  Other Topics Concern  . Not on file  Social History Narrative  . Not on file     BP (!) 156/88   Pulse (!) 51   Ht 5\' 11"  (1.803 m)   Wt 219 lb (99.3 kg)   BMI 30.54 kg/m   Physical Exam:  Well appearing 77 yo man, NAD HEENT: Unremarkable Neck:  No JVD, no thyromegally Lymphatics:  No adenopathy Back:  No CVA tenderness Lungs:  Clear with no wheezes HEART:  Regular rate rhythm, no murmurs, no rubs, no clicks Abd:  soft, positive bowel sounds, no organomegally, no rebound, no guarding Ext:  2 plus pulses, no edema, no cyanosis, no clubbing Skin:  No rashes no nodules Neuro:  CN II through XII intact, motor grossly intact  EKG - sinus bradycardia  Assess/Plan: 1. Sinus node dysfunction - he appears to be asymptomatic. He will undergo watchful waiting. No indication for PPM at this time. 2. PAF - he is maintaining NSR very nicely. I have recommended that the patient stop his flecainde. See below 3. Peripheral neuropathy - I am concerned that his symptoms are related to flecainide. He will stop for 3 weeks. If he does not improve then we will restart. Might consider a different AA drug. 4. ETOH abuse - he denies this although I wonder if he has not done some back sliding.  Cristopher Peru, M.D.

## 2017-08-10 ENCOUNTER — Observation Stay (HOSPITAL_COMMUNITY)
Admission: EM | Admit: 2017-08-10 | Discharge: 2017-08-11 | Disposition: A | Discharge status: Home / Self Care | Payer: Medicare Other | Attending: Internal Medicine | Admitting: Internal Medicine

## 2017-08-10 ENCOUNTER — Encounter (HOSPITAL_COMMUNITY): Payer: Self-pay | Admitting: Emergency Medicine

## 2017-08-10 ENCOUNTER — Emergency Department (HOSPITAL_COMMUNITY): Payer: Medicare Other

## 2017-08-10 DIAGNOSIS — Z79899 Other long term (current) drug therapy: Secondary | ICD-10-CM | POA: Diagnosis not present

## 2017-08-10 DIAGNOSIS — I4891 Unspecified atrial fibrillation: Secondary | ICD-10-CM | POA: Diagnosis not present

## 2017-08-10 DIAGNOSIS — Z888 Allergy status to other drugs, medicaments and biological substances status: Secondary | ICD-10-CM | POA: Insufficient documentation

## 2017-08-10 DIAGNOSIS — G2581 Restless legs syndrome: Secondary | ICD-10-CM | POA: Insufficient documentation

## 2017-08-10 DIAGNOSIS — I480 Paroxysmal atrial fibrillation: Principal | ICD-10-CM | POA: Diagnosis present

## 2017-08-10 DIAGNOSIS — J449 Chronic obstructive pulmonary disease, unspecified: Secondary | ICD-10-CM | POA: Insufficient documentation

## 2017-08-10 DIAGNOSIS — E785 Hyperlipidemia, unspecified: Secondary | ICD-10-CM | POA: Diagnosis not present

## 2017-08-10 DIAGNOSIS — R079 Chest pain, unspecified: Secondary | ICD-10-CM | POA: Diagnosis present

## 2017-08-10 DIAGNOSIS — I495 Sick sinus syndrome: Secondary | ICD-10-CM | POA: Insufficient documentation

## 2017-08-10 DIAGNOSIS — I4589 Other specified conduction disorders: Secondary | ICD-10-CM | POA: Diagnosis present

## 2017-08-10 DIAGNOSIS — F0390 Unspecified dementia without behavioral disturbance: Secondary | ICD-10-CM | POA: Insufficient documentation

## 2017-08-10 DIAGNOSIS — Z7901 Long term (current) use of anticoagulants: Secondary | ICD-10-CM | POA: Diagnosis not present

## 2017-08-10 DIAGNOSIS — I10 Essential (primary) hypertension: Secondary | ICD-10-CM | POA: Diagnosis not present

## 2017-08-10 DIAGNOSIS — Z85828 Personal history of other malignant neoplasm of skin: Secondary | ICD-10-CM | POA: Diagnosis not present

## 2017-08-10 DIAGNOSIS — I2510 Atherosclerotic heart disease of native coronary artery without angina pectoris: Secondary | ICD-10-CM | POA: Diagnosis not present

## 2017-08-10 DIAGNOSIS — Z8673 Personal history of transient ischemic attack (TIA), and cerebral infarction without residual deficits: Secondary | ICD-10-CM | POA: Diagnosis not present

## 2017-08-10 DIAGNOSIS — Z87891 Personal history of nicotine dependence: Secondary | ICD-10-CM | POA: Diagnosis not present

## 2017-08-10 LAB — CBC
HCT: 45.7 % (ref 39.0–52.0)
Hemoglobin: 15.6 g/dL (ref 13.0–17.0)
MCH: 33.3 pg (ref 26.0–34.0)
MCHC: 34.1 g/dL (ref 30.0–36.0)
MCV: 97.4 fL (ref 78.0–100.0)
PLATELETS: 240 10*3/uL (ref 150–400)
RBC: 4.69 MIL/uL (ref 4.22–5.81)
RDW: 12.6 % (ref 11.5–15.5)
WBC: 7.8 10*3/uL (ref 4.0–10.5)

## 2017-08-10 LAB — MAGNESIUM: MAGNESIUM: 2.1 mg/dL (ref 1.7–2.4)

## 2017-08-10 LAB — BASIC METABOLIC PANEL
Anion gap: 11 (ref 5–15)
BUN: 16 mg/dL (ref 6–20)
CALCIUM: 9.3 mg/dL (ref 8.9–10.3)
CO2: 24 mmol/L (ref 22–32)
CREATININE: 1.24 mg/dL (ref 0.61–1.24)
Chloride: 105 mmol/L (ref 101–111)
GFR calc Af Amer: >60 mL/min (ref >60)
GFR calc non Af Amer: 55 mL/min — ABNORMAL LOW (ref >60)
Glucose, Bld: 100 mg/dL — ABNORMAL HIGH (ref 65–99)
Potassium: 4.1 mmol/L (ref 3.5–5.1)
SODIUM: 140 mmol/L (ref 135–145)

## 2017-08-10 LAB — TSH: TSH: 1.034 u[IU]/mL (ref 0.350–4.500)

## 2017-08-10 LAB — TROPONIN I

## 2017-08-10 MED ORDER — ACETAMINOPHEN 325 MG PO TABS: 650.0000 mg | ORAL_TABLET | Freq: Four times a day (QID) | ORAL | Status: DC | PRN

## 2017-08-10 MED ORDER — DILTIAZEM HCL-DEXTROSE 100-5 MG/100ML-% IV SOLN (PREMIX)
5.0000 mg/h | INTRAVENOUS | Status: DC
  Administered 2017-08-10: 5 mg/h via INTRAVENOUS
  Filled 2017-08-10: qty 100

## 2017-08-10 MED ORDER — ACETAMINOPHEN 650 MG RE SUPP: 650.0000 mg | Freq: Four times a day (QID) | RECTAL | Status: DC | PRN

## 2017-08-10 MED ORDER — APIXABAN 5 MG PO TABS
5.0000 mg | ORAL_TABLET | Freq: Two times a day (BID) | ORAL | Status: DC
  Administered 2017-08-10 – 2017-08-11 (×2): 5 mg via ORAL
  Filled 2017-08-10 (×3): qty 1

## 2017-08-10 MED ORDER — DILTIAZEM HCL 30 MG PO TABS
30.0000 mg | ORAL_TABLET | Freq: Four times a day (QID) | ORAL | Status: DC
  Administered 2017-08-10: 30 mg via ORAL
  Filled 2017-08-10 (×2): qty 1

## 2017-08-10 MED ORDER — ONDANSETRON HCL 4 MG/2ML IJ SOLN: 4.0000 mg | Freq: Four times a day (QID) | INTRAMUSCULAR | Status: DC | PRN

## 2017-08-10 MED ORDER — LISINOPRIL 10 MG PO TABS
10.0000 mg | ORAL_TABLET | Freq: Two times a day (BID) | ORAL | Status: DC
  Administered 2017-08-10 – 2017-08-11 (×2): 10 mg via ORAL
  Filled 2017-08-10 (×2): qty 1

## 2017-08-10 MED ORDER — DILTIAZEM LOAD VIA INFUSION
10.0000 mg | Freq: Once | INTRAVENOUS | Status: AC
  Administered 2017-08-10: 10 mg via INTRAVENOUS
  Filled 2017-08-10: qty 10

## 2017-08-10 MED ORDER — FINASTERIDE 5 MG PO TABS
5.0000 mg | ORAL_TABLET | Freq: Every day | ORAL | Status: DC
  Administered 2017-08-10: 5 mg via ORAL
  Filled 2017-08-10: qty 1

## 2017-08-10 MED ORDER — ONDANSETRON HCL 4 MG PO TABS: 4.0000 mg | ORAL_TABLET | Freq: Four times a day (QID) | ORAL | Status: DC | PRN

## 2017-08-10 MED ORDER — NITROGLYCERIN 0.4 MG SL SUBL: 0.4000 mg | SUBLINGUAL_TABLET | SUBLINGUAL | Status: DC | PRN

## 2017-08-10 MED ORDER — DONEPEZIL HCL 10 MG PO TABS
10.0000 mg | ORAL_TABLET | Freq: Every day | ORAL | Status: DC
  Administered 2017-08-10: 10 mg via ORAL
  Filled 2017-08-10: qty 1

## 2017-08-10 MED ORDER — UMECLIDINIUM BROMIDE 62.5 MCG/INH IN AEPB
1.0000 | INHALATION_SPRAY | Freq: Every day | RESPIRATORY_TRACT | Status: DC
  Administered 2017-08-11: 1 via RESPIRATORY_TRACT
  Filled 2017-08-10: qty 7

## 2017-08-10 MED ORDER — ASPIRIN 81 MG PO CHEW
162.0000 mg | CHEWABLE_TABLET | Freq: Once | ORAL | Status: AC
  Administered 2017-08-10: 162 mg via ORAL
  Filled 2017-08-10: qty 2

## 2017-08-10 NOTE — ED Provider Notes (Signed)
Patient placed in Quick Look pathway, seen and evaluated   Chief Complaint: Atrial fibrillation  HPI:   Patient with history of previous atrial fibrillation and ablation surgeries followed by Dr. Lovena Le in cardiology.  Patient states that he has not had any issues for the past 2 years but about 4 PM today he felt his heart start racing and skipping.  He has mild chest pain and mild shortness of breath.  He does not feel lightheaded.  He is on anticoagulation.  ROS: Racing/skipping heart (one)  Physical Exam:   Gen: No distress  Neuro: Awake and Alert  Skin: Warm    Focused Exam: Irregularly irregular heartbeat, rapid rate   Initiation of care has begun. The patient has been counseled on the process, plan, and necessity for staying for the completion/evaluation, and the remainder of the medical screening examination    Erik [name] 08/10/17 1912    Erik Leigh, MD 08/11/17 2221

## 2017-08-10 NOTE — ED Provider Notes (Signed)
I saw and evaluated the patient, reviewed the resident's note and I agree with the findings and plan.   EKG Interpretation  Date/Time:  Friday August 10 2017 19:01:52 EDT Ventricular Rate:  142 PR Interval:    QRS Duration: 82 QT Interval:  308 QTC Calculation: 473 R Axis:   -28 Text Interpretation:  Atrial flutter with variable A-V block Minimal voltage criteria for LVH, may be normal variant Inferior infarct , age undetermined Abnormal ECG Confirmed by Lacretia Leigh [id number]) on 08/10/2017 8:26:57 PM     77 year old male with history of A. fib presents with palpitations along with associated chest pain and shortness of breath.  Is in A. fib with RVR here.  Was given diltiazem and heart rate has improved.  Will admit for cardiac workup   Lacretia Leigh, MD 08/10/17 2105

## 2017-08-10 NOTE — H&P (Signed)
History and Physical    Erik [name]:[id number] DOB: 1940/12/11 DOA: 08/10/2017  PCP: Alroy Dust, L.Marlou Sa, MD  Patient coming from: Home.  Chief Complaint: Chest pain and palpitations.  HPI: Erik [name] is a 77 y.o. male with history of atrial fibrillation with history of ablation, CAD, hypertension presents to the ER with complaints of chest pain and palpitation.  Patient states 3 weeks ago his flecainide was discontinued after patient had lower extremity tingling and numbness concerning for neuropathy.  This evening while having food at [facility] patient started developing chest discomfort with palpitation which persisted.  Denies any associated shortness of breath diaphoresis nausea vomiting.  Patient's wife brought patient to the ER.  ED Course: In the ER patient was found to be in A. fib with RVR.  Also shows some ST depression in the inferior leads.  Troponin initially is negative.  Patient was started on Cardizem infusion.  And admitted for further management of atrial fibrillation with RVR and chest pain.  At the time of my exam patient is chest pain-free and states after the Cardizem infusion was started patient's chest pain improved.  1 dose of p.o. Cardizem was not been ordered by ER physician.  Review of Systems: As per HPI, rest all negative.   Past Medical History:  Diagnosis Date  . Arthritis    both knees  . Atrial fibrillation (HCC)    Paroxysmal, s/p PVI 11/11/10  . Atrial flutter ([place])    s/p RFCA  by GT  . Basal cell carcinoma of ear   . CAD (coronary artery disease)    a. cath 5/12: pLAD 25%, D1 25%, EF 65 % (done 2/2 abnormal myoview with inferapical  ischemia)  . Dyslipidemia   . Dysrhythmia   . HTN (hypertension)   . Hx of echocardiogram    Echo (9/15):  Mild LVH, EF 55-60%, no RWMA, normal diast function, mild MR, mod LAE, mildly increased pulmonary pressures (PASP 30 mmHg).  . Hypertension   . Shortness of breath dyspnea    has a low heart rate    . Snoring    a. sleep study 11/2012 neg for OSA; increased leg movement suspicious for primary movement d/o (ie restless leg syndrome)   . Stroke [facility])    mild stroke with  second heart ablation  . TIA (transient ischemic attack)    11/12/10 post afib ablation    Past Surgical History:  Procedure Laterality Date  . atrial flutter ablation     by GT  . basal cell cancer resection    . CARDIOVERSION  10/31/2011   Procedure: CARDIOVERSION;  Surgeon: Jolaine Artist, MD;  Location: [facility] ENDOSCOPY;  Service: Cardiovascular;  Laterality: N/A;  . CATARACT EXTRACTION, BILATERAL    . COLONOSCOPY    . EYE SURGERY     growth under lid- x2  . HEMORRHOID SURGERY    . KNEE ARTHROSCOPY Bilateral    bilateral  . SHOULDER ARTHROSCOPY Right 08/04/2015   Procedure: ARTHROSCOPY RIGHT SHOULDER;  Surgeon: Frederik Pear, MD;  Location: [place];  Service: Orthopedics;  Laterality: Right;  . TEE WITHOUT CARDIOVERSION  10/31/2011   Procedure: TRANSESOPHAGEAL ECHOCARDIOGRAM (TEE);  Surgeon: Jolaine Artist, MD;  Location: [facility] ENDOSCOPY;  Service: Cardiovascular;  Laterality: N/A;     reports that he quit smoking about 42 years ago. His smoking use included cigarettes. He has a 30.00 pack-year smoking history. He has never used smokeless tobacco. He reports that he does not drink alcohol  or use drugs.  Allergies  Allergen Reactions  . Warfarin And Related Other (See Comments)    Patient's INR could never get regulated while on this  . Adhesive [Tape] Rash and Other (See Comments)    New heart monitor/patch (adhesive) broke out the skin and left a large red patch (still visible after 3 weeks    Family History  Problem Relation Age of Onset  . Hypertension Mother   . Stroke Mother   . Heart disease Mother   . Arthritis Mother   . Heart attack Neg Hx     Prior to Admission medications   Medication Sig Start Date End Date Taking? Authorizing Provider  acetaminophen (TYLENOL) 500 MG tablet Take 500 mg by  mouth every 6 (six) hours as needed (pain). Reported on 05/27/2015   Yes [provider]  apixaban (ELIQUIS) 5 MG TABS tablet Take 1 tablet (5 mg total) by mouth 2 (two) times daily. 02/13/17  Yes Evans Lance, MD  donepezil (ARICEPT) 10 MG tablet Take 10 mg by mouth at bedtime. 11/27/16  Yes [provider]  finasteride (PROSCAR) 5 MG tablet Take 5 mg by mouth at bedtime.  08/08/17  Yes [provider]  INCRUSE ELLIPTA 62.5 MCG/INH AEPB Take 1 puff by mouth daily. 01/29/17  Yes [provider]  Ketotifen Fumarate (ZADITOR OP) Place 2 drops into both eyes every 8 (eight) hours as needed (for itching).   Yes [provider]  lisinopril (PRINIVIL,ZESTRIL) 20 MG tablet Take 0.5 tablets (10 mg total) by mouth 2 (two) times daily. 02/13/17  Yes Evans Lance, MD  nitroGLYCERIN (NITROSTAT) 0.4 MG SL tablet Place 1 tablet (0.4 mg total) under the tongue every 5 (five) minutes as needed for chest pain (MAX 3 TABLETS). 05/05/14  Yes Evans Lance, MD  flecainide (TAMBOCOR) 150 MG tablet Take 1 tablet (150 mg total) by mouth 2 (two) times daily. 02/13/17   Evans Lance, MD    Physical Exam: Vitals:   08/10/17 2200 08/10/17 2215 08/10/17 2230 08/10/17 2300  BP: (!) 150/99 129/90 (!) 145/99 (!) 149/94  Pulse: 74 68 63 71  Resp: 14 18 (!) 21 17  Temp:      SpO2: 96% 96% 97% 96%  Weight:      Height:          Constitutional: Moderately built and nourished. Vitals:   08/10/17 2200 08/10/17 2215 08/10/17 2230 08/10/17 2300  BP: (!) 150/99 129/90 (!) 145/99 (!) 149/94  Pulse: 74 68 63 71  Resp: 14 18 (!) 21 17  Temp:      SpO2: 96% 96% 97% 96%  Weight:      Height:       Eyes: Anicteric no pallor. ENMT: No discharge from the ears eyes nose or mouth. Neck: No mass felt.  No neck rigidity. Respiratory: No rhonchi or crepitations. Cardiovascular: S1-S2 heard no murmurs appreciated. Abdomen: Soft nontender bowel sounds present. Musculoskeletal: No  edema.  No joint effusion. Skin: No rash.  Skin appears warm. Neurologic: Alert awake oriented to time place and person.  Moves all extremities. Psychiatric: Appears normal.  Normal affect.   Labs on Admission: I have personally reviewed following labs and imaging studies  CBC: Recent Labs  Lab 08/10/17 1905  WBC 7.8  HGB 15.6  HCT 45.7  MCV 97.4  PLT 235   Basic Metabolic Panel: Recent Labs  Lab 08/10/17 1905  NA 140  K 4.1  CL 105  CO2  24  GLUCOSE 100*  BUN 16  CREATININE 1.24  CALCIUM 9.3  MG 2.1   GFR: Estimated Creatinine Clearance: 59.7 mL/min (by C-G formula based on SCr of 1.24 mg/dL). Liver Function Tests: No results for input(s): AST, ALT, ALKPHOS, BILITOT, PROT, ALBUMIN in the last 168 hours. No results for input(s): LIPASE, AMYLASE in the last 168 hours. No results for input(s): AMMONIA in the last 168 hours. Coagulation Profile: No results for input(s): INR, PROTIME in the last 168 hours. Cardiac Enzymes: Recent Labs  Lab 08/10/17 1905  TROPONINI <0.03   BNP (last 3 results) No results for input(s): PROBNP in the last 8760 hours. HbA1C: No results for input(s): HGBA1C in the last 72 hours. CBG: No results for input(s): GLUCAP in the last 168 hours. Lipid Profile: No results for input(s): CHOL, HDL, LDLCALC, TRIG, CHOLHDL, LDLDIRECT in the last 72 hours. Thyroid Function Tests: Recent Labs    08/10/17 2110  TSH 1.034   Anemia Panel: No results for input(s): VITAMINB12, FOLATE, FERRITIN, TIBC, IRON, RETICCTPCT in the last 72 hours. Urine analysis:    Component Value Date/Time   COLORURINE YELLOW 11/12/2010 1949   APPEARANCEUR CLEAR 11/12/2010 1949   LABSPEC 1.013 11/12/2010 1949   PHURINE 5.5 11/12/2010 1949   GLUCOSEU NEGATIVE 11/12/2010 1949   HGBUR NEGATIVE 11/12/2010 1949   BILIRUBINUR NEGATIVE 11/12/2010 1949   KETONESUR NEGATIVE 11/12/2010 1949   PROTEINUR NEGATIVE 11/12/2010 1949   UROBILINOGEN 0.2 11/12/2010 1949   NITRITE  NEGATIVE 11/12/2010 1949   LEUKOCYTESUR NEGATIVE 11/12/2010 1949   Sepsis Labs: @LABRCNTIP (procalcitonin:4,lacticidven:4) )No results found for this or any previous visit (from the past 240 hour(s)).   Radiological Exams on Admission: Dg Chest Port 1 View  Result Date: 08/10/2017 CLINICAL DATA:  Atrial fibrillation with rapid ventricular response, history coronary artery disease, hypertension, stroke, former smoker EXAM: PORTABLE CHEST 1 VIEW COMPARISON:  Portable exam 1935 hours compared to 10/20/2016 FINDINGS: Normal heart size, mediastinal contours, and pulmonary vascularity. Minimal bibasilar atelectasis. Lungs otherwise clear. No acute infiltrate, pleural effusion or pneumothorax. Old LEFT rib fractures. Bones demineralized. IMPRESSION: Minimal bibasilar atelectasis. Electronically Signed   By: Lavonia Dana M.D.   On: 08/10/2017 19:53    EKG: Independently reviewed.  A. fib with RVR with ST depression in the inferior leads.  Assessment/Plan Principal Problem:   Atrial fibrillation with RVR (HCC) Active Problems:   Essential hypertension   Chest pain    1. A. fib with RVR -on Cardizem infusion and 1 dose of p.o. Cardizem has been ordered by the ER physician.  We will slowly try to wean off Cardizem infusion if possible.  Discussed with on-call cardiologist at this time recommended to continue the present medication and consult electrophysiology cardiologist in a.m. check TSH. 2. Chest pain -EKG does show some ST depression in the inferior leads.  We will cycle cardiac markers.  Present with chest pain-free.  Patient is on apixaban.  Cardiology consult requested. 3. History of sick sinus syndrome. 4. Hypertension on lisinopril. 5. COPD presently not wheezing continue inhalers. 6. Memory issues on Aricept.   DVT prophylaxis: Apixaban. Code Status: Full code. Family Communication: Patient's wife. Disposition Plan: Home. Consults called: Cardiology. Admission status:  Observation.   Rise Patience MD [facility] Pager [phone].  If 7PM-7AM, please contact night-coverage [email or web address] Password [facility]  08/10/2017, 11:24 PM

## 2017-08-10 NOTE — ED Triage Notes (Signed)
Pt reports CP onset w/i past hr. Pressure, tight, 6/10. Pt noted to be in Afib RVR upon arrival, hx of it.

## 2017-08-10 NOTE — ED Provider Notes (Signed)
[facility] EMERGENCY DEPARTMENT Provider Note   CSN: [id number] Arrival date & time: 08/10/17  1854     History   Chief Complaint Chief Complaint  Patient presents with  . Chest Pain    HPI Erik [name] is a 77 y.o. male.  The history is provided by the patient.  Chest Pain   This is a new problem. The current episode started 6 to 12 hours ago. The problem has not changed since onset.Associated with: palpitations from afib. The pain is present in the substernal region. The pain is mild. The quality of the pain is described as pressure-like. The pain does not radiate. Associated symptoms include irregular heartbeat and palpitations. Pertinent negatives include no abdominal pain, no back pain, no cough, no exertional chest pressure, no fever, no headaches, no lower extremity edema, no nausea, no near-syncope, no shortness of breath, no syncope and no vomiting.  -Patient today started having palpitations and his A. fib return.  Patient the risk was taken off his flecainide due to side effects of peripheral neuropathy.  Previously patient was well controlled on the flecainide.  He is anticoagulated on Eliquis.  Past Medical History:  Diagnosis Date  . Arthritis    both knees  . Atrial fibrillation (HCC)    Paroxysmal, s/p PVI 11/11/10  . Atrial flutter ([place])    s/p RFCA  by GT  . Basal cell carcinoma of ear   . CAD (coronary artery disease)    a. cath 5/12: pLAD 25%, D1 25%, EF 65 % (done 2/2 abnormal myoview with inferapical  ischemia)  . Dyslipidemia   . Dysrhythmia   . HTN (hypertension)   . Hx of echocardiogram    Echo (9/15):  Mild LVH, EF 55-60%, no RWMA, normal diast function, mild MR, mod LAE, mildly increased pulmonary pressures (PASP 30 mmHg).  . Hypertension   . Shortness of breath dyspnea    has a low heart rate  . Snoring    a. sleep study 11/2012 neg for OSA; increased leg movement suspicious for primary movement d/o (ie restless leg  syndrome)   . Stroke [facility])    mild stroke with  second heart ablation  . TIA (transient ischemic attack)    11/12/10 post afib ablation    Patient Active Problem List   Diagnosis Date Noted  . Atrial fibrillation with RVR ([place]) 08/10/2017  . ETOH abuse 02/16/2015  . Dyspnea 09/01/2014  . Snoring 09/01/2014  . Periodic limb movement disorder (PLMD) 09/01/2014  . 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 TIA (transient ischemic attack) 11/18/2010  . CAD (coronary artery disease) 10/05/2010  . Acute MI anterior wall first episode care ([place]) 02/03/2009  . Essential hypertension 02/03/2009  . Atrial fibrillation ([place]) 02/03/2009  . Atrial flutter ([place]) 02/03/2009    Past Surgical History:  Procedure Laterality Date  . atrial flutter ablation     by GT  . basal cell cancer resection    . CARDIOVERSION  10/31/2011   Procedure: CARDIOVERSION;  Surgeon: Jolaine Artist, MD;  Location: [facility] ENDOSCOPY;  Service: Cardiovascular;  Laterality: N/A;  . CATARACT EXTRACTION, BILATERAL    . COLONOSCOPY    . EYE SURGERY     growth under lid- x2  . HEMORRHOID SURGERY    . KNEE ARTHROSCOPY Bilateral    bilateral  . SHOULDER ARTHROSCOPY Right 08/04/2015   Procedure: ARTHROSCOPY RIGHT SHOULDER;  Surgeon: Frederik Pear, MD;  Location: [facility];  Service: Orthopedics;  Laterality: Right;  .  TEE WITHOUT CARDIOVERSION  10/31/2011   Procedure: TRANSESOPHAGEAL ECHOCARDIOGRAM (TEE);  Surgeon: Jolaine Artist, MD;  Location: [facility] ENDOSCOPY;  Service: Cardiovascular;  Laterality: N/A;        Home Medications    Prior to Admission medications   Medication Sig Start Date End Date Taking? Authorizing Provider  acetaminophen (TYLENOL) 500 MG tablet Take 500 mg by mouth every 6 (six) hours as needed (pain). Reported on 05/27/2015   Yes [provider]  apixaban (ELIQUIS) 5 MG TABS tablet Take 1 tablet (5 mg total) by mouth 2 (two) times daily. 02/13/17  Yes Evans Lance, MD  donepezil (ARICEPT) 10 MG  tablet Take 10 mg by mouth at bedtime. 11/27/16  Yes [provider]  finasteride (PROSCAR) 5 MG tablet Take 5 mg by mouth at bedtime.  08/08/17  Yes [provider]  INCRUSE ELLIPTA 62.5 MCG/INH AEPB Take 1 puff by mouth daily. 01/29/17  Yes [provider]  Ketotifen Fumarate (ZADITOR OP) Place 2 drops into both eyes every 8 (eight) hours as needed (for itching).   Yes [provider]  lisinopril (PRINIVIL,ZESTRIL) 20 MG tablet Take 0.5 tablets (10 mg total) by mouth 2 (two) times daily. 02/13/17  Yes Evans Lance, MD  nitroGLYCERIN (NITROSTAT) 0.4 MG SL tablet Place 1 tablet (0.4 mg total) under the tongue every 5 (five) minutes as needed for chest pain (MAX 3 TABLETS). 05/05/14  Yes Evans Lance, MD  flecainide (TAMBOCOR) 150 MG tablet Take 1 tablet (150 mg total) by mouth 2 (two) times daily. 02/13/17   Evans Lance, MD    Family History Family History  Problem Relation Age of Onset  . Hypertension Mother   . Stroke Mother   . Heart disease Mother   . Arthritis Mother   . Heart attack Neg Hx     Social History Social History   Tobacco Use  . Smoking status: Former Smoker    Packs/day: 1.50    Years: 20.00    Pack years: 30.00    Types: Cigarettes    Last attempt to quit: 05/23/1975    Years since quitting: 42.2  . Smokeless tobacco: Never Used  Substance Use Topics  . Alcohol use: No    Alcohol/week: 7.0 oz    Types: 14 Standard drinks or equivalent per week    Comment: has not had a drink in 9 months  . Drug use: No     Allergies   Warfarin and related and Adhesive [tape]   Review of Systems Review of Systems  Constitutional: Negative for chills and fever.  HENT: Negative for ear pain and sore throat.   Eyes: Negative for pain and visual disturbance.  Respiratory: Negative for cough and shortness of breath.   Cardiovascular: Positive for chest pain and palpitations. Negative for syncope and near-syncope.  Gastrointestinal:  Negative for abdominal pain, nausea and vomiting.  Genitourinary: Negative for dysuria.  Musculoskeletal: Negative for back pain and neck pain.  Skin: Negative for rash.  Neurological: Negative for syncope, light-headedness and headaches.  All other systems reviewed and are negative.    Physical Exam Updated Vital Signs BP (!) 150/99   Pulse 74   Temp 97.9 F (36.6 C)   Resp 14   Ht 5\' 11"  (1.803 m)   Wt 95.3 kg (210 lb)   SpO2 96%   BMI 29.29 kg/m   Physical Exam  Constitutional: He appears well-developed and well-nourished. No distress.  HENT:  Head: Normocephalic and  atraumatic.  Eyes: Conjunctivae are normal.  Neck: Neck supple.  Cardiovascular: An irregularly irregular rhythm present. Tachycardia present.  Pulmonary/Chest: Effort normal and breath sounds normal. No respiratory distress. He has no decreased breath sounds. He has no wheezes. He has no rales.  Abdominal: Soft. There is no tenderness.  Musculoskeletal:       Right lower leg: He exhibits edema (trace).       Left lower leg: He exhibits edema (trace).  Neurological: He is alert.  Skin: Skin is warm and dry.  Psychiatric: He has a normal mood and affect.  Nursing note and vitals reviewed.    ED Treatments / Results  Labs (all labs ordered are listed, but only abnormal results are displayed) Labs Reviewed  BASIC METABOLIC PANEL - Abnormal; Notable for the following components:      Result Value   Glucose, Bld 100 (*)    GFR calc non Af Amer 55 (*)    All other components within normal limits  MAGNESIUM  CBC  TROPONIN I  TSH    EKG None  Radiology Dg Chest Port 1 View  Result Date: 08/10/2017 CLINICAL DATA:  Atrial fibrillation with rapid ventricular response, history coronary artery disease, hypertension, stroke, former smoker EXAM: PORTABLE CHEST 1 VIEW COMPARISON:  Portable exam 1935 hours compared to 10/20/2016 FINDINGS: Normal heart size, mediastinal contours, and pulmonary vascularity.  Minimal bibasilar atelectasis. Lungs otherwise clear. No acute infiltrate, pleural effusion or pneumothorax. Old LEFT rib fractures. Bones demineralized. IMPRESSION: Minimal bibasilar atelectasis. Electronically Signed   By: Lavonia Dana M.D.   On: 08/10/2017 19:53    Procedures Procedures (including critical care time)  Medications Ordered in ED Medications  diltiazem (CARDIZEM) 1 mg/mL load via infusion 10 mg (10 mg Intravenous Bolus from Bag 08/10/17 2024)    And  diltiazem (CARDIZEM) 100 mg in dextrose 5% 158mL (1 mg/mL) infusion (5 mg/hr Intravenous New Bag/Given 08/10/17 2024)  diltiazem (CARDIZEM) tablet 30 mg (30 mg Oral Given 08/10/17 2150)  aspirin chewable tablet 162 mg (162 mg Oral Given 08/10/17 2021)     Initial Impression / Assessment and Plan / ED Course  I have reviewed the triage vital signs and the nursing notes.  Pertinent labs & imaging results that were available during my care of the patient were reviewed by me and considered in my medical decision making (see chart for details).     Patient is a 77 year old male with history of atrial for ablation on Eliquis and previously on flecainide, hypertension, dyslipidemia, CAD, hypertension, TIA/stroke who presents with 1 day of chest pain and return of his atrial fibrillation.  On arrival patient is in A. fib with a rate in the 140s.  Troponin is negative.  EKG shows A. fib with RVR but no specific ischemia.  Rate is controlled with Cardizem 10 mg bolus followed by infusion.  P.o. diltiazem ordered with plan to wean off the infusion.  Patient also need ischemic evaluation given his high risk history.  Patient will be admitted to hospitalist for further workup.  Final Clinical Impressions(s) / ED Diagnoses   Final diagnoses:  Chest pain, unspecified type  Atrial fibrillation with RVR [facility])    ED Discharge Orders        Ordered    Amb referral to AFIB Clinic     08/10/17 Cro[place], [place], DO 08/10/17  2228

## 2017-08-11 ENCOUNTER — Other Ambulatory Visit: Payer: Self-pay

## 2017-08-11 DIAGNOSIS — I4891 Unspecified atrial fibrillation: Secondary | ICD-10-CM | POA: Diagnosis not present

## 2017-08-11 DIAGNOSIS — I25118 Atherosclerotic heart disease of native coronary artery with other forms of angina pectoris: Secondary | ICD-10-CM

## 2017-08-11 DIAGNOSIS — R0789 Other chest pain: Secondary | ICD-10-CM | POA: Diagnosis not present

## 2017-08-11 DIAGNOSIS — I480 Paroxysmal atrial fibrillation: Secondary | ICD-10-CM | POA: Diagnosis not present

## 2017-08-11 DIAGNOSIS — I495 Sick sinus syndrome: Secondary | ICD-10-CM | POA: Diagnosis not present

## 2017-08-11 DIAGNOSIS — I2510 Atherosclerotic heart disease of native coronary artery without angina pectoris: Secondary | ICD-10-CM | POA: Diagnosis not present

## 2017-08-11 DIAGNOSIS — I10 Essential (primary) hypertension: Secondary | ICD-10-CM

## 2017-08-11 DIAGNOSIS — I4589 Other specified conduction disorders: Secondary | ICD-10-CM

## 2017-08-11 LAB — BASIC METABOLIC PANEL
Anion gap: 8 (ref 5–15)
BUN: 14 mg/dL (ref 6–20)
CHLORIDE: 107 mmol/L (ref 101–111)
CO2: 22 mmol/L (ref 22–32)
CREATININE: 0.98 mg/dL (ref 0.61–1.24)
Calcium: 8.7 mg/dL — ABNORMAL LOW (ref 8.9–10.3)
GFR calc Af Amer: >60 mL/min (ref >60)
GFR calc non Af Amer: >60 mL/min (ref >60)
Glucose, Bld: 94 mg/dL (ref 65–99)
POTASSIUM: 3.9 mmol/L (ref 3.5–5.1)
Sodium: 137 mmol/L (ref 135–145)

## 2017-08-11 LAB — CBC
HEMATOCRIT: 44.8 % (ref 39.0–52.0)
Hemoglobin: 14.9 g/dL (ref 13.0–17.0)
MCH: 32.5 pg (ref 26.0–34.0)
MCHC: 33.3 g/dL (ref 30.0–36.0)
MCV: 97.8 fL (ref 78.0–100.0)
PLATELETS: 236 10*3/uL (ref 150–400)
RBC: 4.58 MIL/uL (ref 4.22–5.81)
RDW: 13 % (ref 11.5–15.5)
WBC: 7.9 10*3/uL (ref 4.0–10.5)

## 2017-08-11 LAB — TROPONIN I
TROPONIN I: 0.06 ng/mL — AB (ref <0.03)
Troponin I: 0.03 ng/mL (ref <0.03)
Troponin I: 0.08 ng/mL (ref <0.03)

## 2017-08-11 LAB — MRSA PCR SCREENING: MRSA BY PCR: NEGATIVE

## 2017-08-11 MED ORDER — PROPAFENONE HCL ER 325 MG PO CP12
325.0000 mg | ORAL_CAPSULE | Freq: Two times a day (BID) | ORAL | Status: DC
  Administered 2017-08-11: 325 mg via ORAL
  Filled 2017-08-11 (×2): qty 1

## 2017-08-11 MED ORDER — PROPAFENONE HCL ER 325 MG PO CP12: 325.0000 mg | ORAL_CAPSULE | Freq: Two times a day (BID) | ORAL | 0 refills | Status: DC

## 2017-08-11 NOTE — Progress Notes (Signed)
Dr. Nevada Crane did not re-round on patient prior to discharge; patient and wife concerned as cardiology had previously recommended overnight stay.  This Probation officer confirmed w/Dr. Curt Bears OK to discharge.  Discharge instructions reviewed w/patient and his wife, w/additional instructions regarding Sinus Bradycardia d/t patient's HR dropping into low 40s.  Patient and family also w/concern over having to stop taking Tylenol as a home medication per initial AVS; this Probation officer called Dr. Nevada Crane to justify changing the medication - stated it was OK to continue taking Tylenol - patient and wife informed and discharge instructions changed accordingly.  Patient and wife also concerned that their pharmacy of choice may not have the RX for Rythmol.  This Probation officer called pharmacy and confirmed that Rythmol would not be available for pickup from patient until Monday, 08/13/17, which is unacceptable for appropriate patient care.  RN called two other local pharmacies to confirm availability of RX and then requested Dr. Nevada Crane fax RX to that pharmacy to ensure patient able to pick up RX for this evening's dose.  Dr. Nevada Crane printed copy of RX for patient w/o faxing.  Due to delay and patient/wife concerns over discharge, RX was called into local pharmacy for expediency in discharge process.  Patient and all belongings discharged home.

## 2017-08-11 NOTE — Progress Notes (Signed)
Oswaldo Done, PA for Cardiology returned page; notified of conversion to SB; states continue to give Rythmol doses as ordered despite SB.  Will continue to monitor.

## 2017-08-11 NOTE — Consult Note (Addendum)
Cardiology Consultation:   Patient ID: [name]; [id number]; 08/18/1940   Admit date: 08/10/2017 Date of Consult: 08/11/2017  Primary Care Provider: Alroy Dust, Carlean Jews.Marlou Sa, MD Primary Electrophysiologist:  Dr Lovena Le   Patient Profile:   Erik [name] is a 77 y.o. male with a hx of PAF who is being seen today for the evaluation of AF with RVR at the request of Dr Sherral Hammers.  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 The pt had a RFA in 2012 for flutter. He had done well for years on Flecainide. 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 Dr Lovena Le saw the pt 07/24/17 and the pt complained of burning in his feet and pain in his legs. His Flecainide was stopped. Last night he went into AF with RVR and presented to the ED. His rate is improved on IV Diltiazem.    Past Medical History:  Diagnosis Date  . Arthritis    both knees  . Atrial fibrillation (HCC)    Paroxysmal, s/p PVI 11/11/10  . Atrial flutter ([place])    s/p RFCA  by GT  . Basal cell carcinoma of ear   . CAD (coronary artery disease)    a. cath 5/12: pLAD 25%, D1 25%, EF 65 % (done 2/2 abnormal myoview with inferapical  ischemia)  . Dyslipidemia   . Dysrhythmia   . HTN (hypertension)   . Hx of echocardiogram    Echo (9/15):  Mild LVH, EF 55-60%, no RWMA, normal diast function, mild MR, mod LAE, mildly increased pulmonary pressures (PASP 30 mmHg).  . Hypertension   . Shortness of breath dyspnea    has a low heart rate  . Snoring    a. sleep study 11/2012 neg for OSA; increased leg movement suspicious for primary movement d/o (ie restless leg syndrome)   . Stroke [facility])    mild stroke with  second heart ablation  . TIA (transient ischemic attack)    11/12/10 post afib ablation    Past Surgical History:  Procedure Laterality Date  . atrial flutter ablation     by GT  . basal cell cancer resection      . CARDIOVERSION  10/31/2011   Procedure: CARDIOVERSION;  Surgeon: Jolaine Artist, MD;  Location: [facility] ENDOSCOPY;  Service: Cardiovascular;  Laterality: N/A;  . CATARACT EXTRACTION, BILATERAL    . COLONOSCOPY    . EYE SURGERY     growth under lid- x2  . HEMORRHOID SURGERY    . KNEE ARTHROSCOPY Bilateral    bilateral  . SHOULDER ARTHROSCOPY Right 08/04/2015   Procedure: ARTHROSCOPY RIGHT SHOULDER;  Surgeon: Frederik Pear, MD;  Location: [facility];  Service: Orthopedics;  Laterality: Right;  . TEE WITHOUT CARDIOVERSION  10/31/2011   Procedure: TRANSESOPHAGEAL ECHOCARDIOGRAM (TEE);  Surgeon: Jolaine Artist, MD;  Location: [facility] ENDOSCOPY;  Service: Cardiovascular;  Laterality: N/A;     Home Medications:  Prior to Admission medications   Medication Sig Start Date End Date Taking? Authorizing Provider  acetaminophen (TYLENOL) 500 MG tablet Take 500 mg by mouth every 6 (six) hours as needed (pain). Reported on 05/27/2015   Yes [provider]  apixaban (ELIQUIS) 5 MG TABS tablet Take 1 tablet (5 mg total) by mouth 2 (two) times daily. 02/13/17  Yes Evans Lance, MD  donepezil (ARICEPT) 10 MG tablet Take 10 mg by mouth at bedtime.  11/27/16  Yes [provider]  finasteride (PROSCAR) 5 MG tablet Take 5 mg by mouth at bedtime.  08/08/17  Yes [provider]  INCRUSE ELLIPTA 62.5 MCG/INH AEPB Take 1 puff by mouth daily. 01/29/17  Yes [provider]  Ketotifen Fumarate (ZADITOR OP) Place 2 drops into both eyes every 8 (eight) hours as needed (for itching).   Yes [provider]  lisinopril (PRINIVIL,ZESTRIL) 20 MG tablet Take 0.5 tablets (10 mg total) by mouth 2 (two) times daily. 02/13/17  Yes Evans Lance, MD  nitroGLYCERIN (NITROSTAT) 0.4 MG SL tablet Place 1 tablet (0.4 mg total) under the tongue every 5 (five) minutes as needed for chest pain (MAX 3 TABLETS). 05/05/14  Yes Evans Lance, MD  flecainide (TAMBOCOR) 150 MG tablet Take 1 tablet (150 mg total)  by mouth 2 (two) times daily. 02/13/17   Evans Lance, MD    Inpatient Medications: Scheduled Meds: . apixaban  5 mg Oral BID  . donepezil  10 mg Oral QHS  . finasteride  5 mg Oral QHS  . lisinopril  10 mg Oral BID  . umeclidinium bromide  1 puff Inhalation Daily   Continuous Infusions:  PRN Meds: acetaminophen **OR** acetaminophen, nitroGLYCERIN, ondansetron **OR** ondansetron (ZOFRAN) IV  Allergies:    Allergies  Allergen Reactions  . Warfarin And Related Other (See Comments)    Patient's INR could never get regulated while on this  . Adhesive [Tape] Rash and Other (See Comments)    New heart monitor/patch (adhesive) broke out the skin and left a large red patch (still visible after 3 weeks    Social History:   Social History   Socioeconomic History  . Marital status: Divorced    Spouse name: Not on file  . Number of children: 1  . Years of education: Not on file  . Highest education level: Not on file  Occupational History  . 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 Financial resource strain: Not on file  . Food insecurity:    Worry: Not on file    Inability: Not on file  . Transportation needs:    Medical: Not on file    Non-medical: Not on file  Tobacco Use  . Smoking status: Former Smoker    Packs/day: 1.50    Years: 20.00    Pack years: 30.00    Types: Cigarettes    Last attempt to quit: 05/23/1975    Years since quitting: 42.2  . Smokeless tobacco: Never Used  Substance and Sexual Activity  . Alcohol use: No    Alcohol/week: 7.0 oz    Types: 14 Standard drinks or equivalent per week    Comment: has not had a drink in 9 months  . Drug use: No  . Sexual activity: Not on file  Lifestyle  . Physical activity:    Days per week: Not on file    Minutes per session: Not on file  . Stress: Not on file  Relationships  . Social connections:    Talks on phone: Not on file    Gets  together: Not on file    Attends religious service: Not on file    Active member of club or organization: Not on file    Attends meetings of clubs or organizations: Not on file    Relationship status: Not on file  . Intimate partner violence:    Fear  of current or ex partner: Not on file    Emotionally abused: Not on file    Physically abused: Not on file    Forced sexual activity: Not on file  Other Topics Concern  . Not on file  Social History Narrative  . Not on file    Family History:    Family History  Problem Relation Age of Onset  . Hypertension Mother   . Stroke Mother   . Heart disease Mother   . Arthritis Mother   . Heart attack Neg Hx      ROS:  Please see the history of present illness.  Restless leg syndrome DJD L-spine Daily ETOH All other ROS reviewed and negative.     Physical Exam/Data:   Vitals:   08/11/17 6433 08/11/17 0624 08/11/17 0734 08/11/17 0735  BP: 114/86  118/68   Pulse: 81  76 92  Resp: 15  15 18   Temp: 98 F (36.7 C)  98.2 F (36.8 C)   TempSrc: Oral  Oral   SpO2: 98%  93% 96%  Weight:  212 lb 1.6 oz (96.2 kg)    Height:  5\' 11"  (1.803 m)     No intake or output data in the 24 hours ending 08/11/17 0821 Filed Weights   08/10/17 1913 08/11/17 0624  Weight: 210 lb (95.3 kg) 212 lb 1.6 oz (96.2 kg)   Body mass index is 29.58 kg/m.  General:  Well nourished, well developed, in no acute distress HEENT: normal Lymph: no adenopathy Neck: no JVD Endocrine:  No thryomegaly Vascular: No carotid bruits; FA pulses 2+ bilaterally without bruits  Cardiac:  Irregularly irregular Lungs:  clear to auscultation bilaterally, no wheezing, rhonchi or rales  Abd: soft, nontender, no hepatomegaly  Ext: no edema Musculoskeletal:  No deformities, BUE and BLE strength normal and equal Skin: warm and dry  Neuro:  CNs 2-12 intact, no focal abnormalities noted Psych:  Normal affect   EKG:  The EKG was personally reviewed and demonstrates:  AF  with RVR, Q 3, AVF Telemetry:  Telemetry was personally reviewed and demonstrates:  AF-90's  Relevant CV Studies:  Echo 07/02/17- Study Conclusions  - Left ventricle: The cavity size was mildly dilated. Wall   thickness was increased in a pattern of mild LVH. Systolic   function was normal. The estimated ejection fraction was in the   range of 55% to 60%. Features are consistent with a pseudonormal   left ventricular filling pattern, with concomitant abnormal   relaxation and increased filling pressure (grade 2 diastolic   dysfunction). Doppler parameters are consistent with high   ventricular filling pressure. - Aortic valve: There was trivial regurgitation. - Mitral valve: Calcified annulus. Mildly thickened leaflets .   There was mild regurgitation. - Left atrium: The atrium was mildly dilated. - Pulmonary arteries: PA peak pressure: 35 mm Hg (S).  Laboratory Data:  Chemistry Recent Labs  Lab 08/10/17 1905 08/11/17 0504  NA 140 137  K 4.1 3.9  CL 105 107  CO2 24 22  GLUCOSE 100* 94  BUN 16 14  CREATININE 1.24 0.98  CALCIUM 9.3 8.7*  GFRNONAA 55* >60  GFRAA >60 >60  ANIONGAP 11 8    No results for input(s): PROT, ALBUMIN, AST, ALT, ALKPHOS, BILITOT in the last 168 hours. Hematology Recent Labs  Lab 08/10/17 1905 08/11/17 0504  WBC 7.8 7.9  RBC 4.69 4.58  HGB 15.6 14.9  HCT 45.7 44.8  MCV 97.4 97.8  MCH 33.3 32.5  MCHC 34.1 33.3  RDW 12.6 13.0  PLT 240 236   Cardiac Enzymes Recent Labs  Lab 08/10/17 1905 08/10/17 2332 08/11/17 0504  TROPONINI <0.03 0.03* 0.08*   No results for input(s): TROPIPOC in the last 168 hours.  BNPNo results for input(s): BNP, PROBNP in the last 168 hours.  DDimer No results for input(s): DDIMER in the last 168 hours.  Radiology/Studies:  Dg Chest Port 1 View  Result Date: 08/10/2017 CLINICAL DATA:  Atrial fibrillation with rapid ventricular response, history coronary artery disease, hypertension, stroke, former smoker  EXAM: PORTABLE CHEST 1 VIEW COMPARISON:  Portable exam 1935 hours compared to 10/20/2016 FINDINGS: Normal heart size, mediastinal contours, and pulmonary vascularity. Minimal bibasilar atelectasis. Lungs otherwise clear. No acute infiltrate, pleural effusion or pneumothorax. Old LEFT rib fractures. Bones demineralized. IMPRESSION: Minimal bibasilar atelectasis. Electronically Signed   By: Lavonia Dana M.D.   On: 08/10/2017 19:53    Assessment and Plan:   PAF- Recurrent AF with RVR off Flecainide  Neuropathy- Flecainide stopped 07/24/17 with no real improvement. Pt has back issues as well as restless leg syndrome that may be contributing.   Anticoagulated- Elquis 5 mg BID, CHADS VASC-5  H/O CVA- S/p RFA 2012  Mild CAD- 25% LAD 2012, EF 55-60% by echo Feb 2019- mild LAE  Mild dementia- Short term memory issues. Pt on Aricept. Daily ETOH, "one drink a night"  Sinus node dysfunction- Pt has baseline bradycardia and chronotropic incompetence by GXT  Plan: Will discuss with MD. Dr Lovena Le had mentioned considering another antiarrythmic, not sure Amiodarone is a good option with his neuropathy and daily ETOH.   For questions or updates, please contact [place] Please consult [email or web address] for contact info under Cardiology/STEMI.   Signed, Kerin Ransom, PA-C  08/11/2017 8:21 AM   I have seen and examined this patient with Kerin Ransom.  Agree with above, note added to reflect my findings.  On exam, iRRR, no murmurs, lungs clear.  She presented to the hospital with chest pain and shortness of breath, found to be in atrial fibrillation with rapid rates.  He was previously on flecainide but this was stopped due to lower extremity numbness and tingling.  This was stopped greater than 2 weeks ago and he continues to have symptoms of numbness and tingling that has been unchanged.  It is likely that he does continue to need antiarrhythmic medications to keep him in sinus rhythm.  Due to that, we  will plan to start him on propafenone today.  Should he convert on his own, he would be stable for discharge with follow-up in EP clinic in the next 2 weeks.  If he does not  convert, will attempt cardioversion on Monday.    Will M. Camnitz MD 08/11/2017 8:51 AM

## 2017-08-11 NOTE — Care Management Obs Status (Signed)
[facility] NOTIFICATION   Patient Details  Name: Erik [name] MRN: [id number] Date of Birth: [birth year]   Medicare Observation Status Notification Given:  Yes    Carles Collet, RN 08/11/2017, 9:04 AM

## 2017-08-11 NOTE — Discharge Instructions (Addendum)
Bradycardia, Adult Bradycardia is a slower-than-normal heartbeat. A normal resting heart rate for an adult ranges from 60 to 100 beats per minute. With bradycardia, the resting heart rate is less than 60 beats per minute. Bradycardia can prevent enough oxygen from reaching certain areas of your body when you are active. It can be serious if it keeps enough oxygen from reaching your brain and other parts of your body. Bradycardia is not a problem for everyone. For some healthy adults, a slow resting heart rate is normal. What are the causes? This condition may be caused by:  A problem with the heart, including: ? A problem with the heart's electrical system, such as a heart block. ? A problem with the heart's natural pacemaker (sinus node). ? Heart disease. ? A heart attack. ? Heart damage. ? A heart infection. ? A heart condition that is present at birth (congenital heart defect).  Certain medicines that treat heart conditions.  Certain conditions, such as hypothyroidism and obstructive sleep apnea.  Problems with the balance of chemicals and other substances, like potassium, in the blood.  What increases the risk? This condition is more likely to develop in adults who:  Are age 80 or older.  Have high blood pressure (hypertension), high cholesterol (hyperlipidemia), or diabetes.  Drink heavily, use tobacco or nicotine products, or use drugs.  Are stressed.  What are the signs or symptoms? Symptoms of this condition include:  Light-headedness.  Feeling faint or fainting.  Fatigue and weakness.  Shortness of breath.  Chest pain (angina).  Drowsiness.  Confusion.  Dizziness.  How is this diagnosed? This condition may be diagnosed based on:  Your symptoms.  Your medical history.  A physical exam.  During the exam, your health care provider will listen to your heartbeat and check your pulse. To confirm the diagnosis, your health care provider may order tests,  such as:  Blood tests.  An electrocardiogram (ECG). This test records the heart's electrical activity. The test can show how fast your heart is beating and whether the heartbeat is steady.  A test in which you wear a portable device (event recorder or Holter monitor) to record your heart's electrical activity while you go about your day.  An exercise test.  How is this treated? Treatment for this condition depends on the cause of the condition and how severe your symptoms are. Treatment may involve:  Treatment of the underlying condition.  Changing your medicines or how much medicine you take.  Having a small, battery-operated device called a pacemaker implanted under the skin. When bradycardia occurs, this device can be used to increase your heart rate and help your heart to beat in a regular rhythm.  Follow these instructions at home: Lifestyle   Manage any health conditions that contribute to bradycardia as told by your health care provider.  Follow a heart-healthy diet. A nutrition specialist (dietitian) can help to educate you about healthy food options and changes.  Follow an exercise program that is approved by your health care provider.  Maintain a healthy weight.  Try to reduce or manage your stress, such as with yoga or meditation. If you need help reducing stress, ask your health care provider.  Do not use use any products that contain nicotine or tobacco, such as cigarettes and e-cigarettes. If you need help quitting, ask your health care provider.  Do not use illegal drugs.  Limit alcohol intake to no more than 1 drink per day for nonpregnant women and 2 drinks  per day for men. One drink equals 12 oz of beer, 5 oz of wine, or 1 oz of hard liquor. General instructions  Take over-the-counter and prescription medicines only as told by your health care provider.  Keep all follow-up visits as directed by your health care provider. This is important. How is this  prevented? In some cases, bradycardia may be prevented by:  Treating underlying medical problems.  Stopping behaviors or medicines that can trigger the condition.  Contact a health care provider if:  You feel light-headed or dizzy.  You almost faint.  You feel weak or are easily fatigued during physical activity.  You experience confusion or have memory problems. Get help right away if:  You faint.  You have an irregular heartbeat (palpitations).  You have chest pain.  You have trouble breathing. This information is not intended to replace advice given to you by your health care provider. Make sure you discuss any questions you have with your health care provider. Document Released: 01/28/2002 Document Revised: 01/04/2016 Document Reviewed: 10/28/2015 Elsevier Interactive Patient Education  2017 [place]. Atrial Fibrillation Atrial fibrillation is a type of heartbeat that is irregular or fast (rapid). If you have this condition, your heart keeps quivering in a weird (chaotic) way. This condition can make it so your heart cannot pump blood normally. Having this condition gives a person more risk for stroke, heart failure, and other heart problems. There are different types of atrial fibrillation. Talk with your doctor to learn about the type that you have. Follow these instructions at home:  Take over-the-counter and prescription medicines only as told by your doctor.  If your doctor prescribed a blood-thinning medicine, take it exactly as told. Taking too much of it can cause bleeding. If you do not take enough of it, you will not have the protection that you need against stroke and other problems.  Do not use any tobacco products. These include cigarettes, chewing tobacco, and e-cigarettes. If you need help quitting, ask your doctor.  If you have apnea (obstructive sleep apnea), manage it as told by your doctor.  Do not drink alcohol.  Do not drink beverages that have  caffeine. These include coffee, soda, and tea.  Maintain a healthy weight. Do not use diet pills unless your doctor says they are safe for you. Diet pills may make heart problems worse.  Follow diet instructions as told by your doctor.  Exercise regularly as told by your doctor.  Keep all follow-up visits as told by your doctor. This is important. Contact a doctor if:  You notice a change in the speed, rhythm, or strength of your heartbeat.  You are taking a blood-thinning medicine and you notice more bruising.  You get tired more easily when you move or exercise. Get help right away if:  You have pain in your chest or your belly (abdomen).  You have sweating or weakness.  You feel sick to your stomach (nauseous).  You notice blood in your throw up (vomit), poop (stool), or pee (urine).  You are short of breath.  You suddenly have swollen feet and ankles.  You feel dizzy.  Your suddenly get weak or numb in your face, arms, or legs, especially if it happens on one side of your body.  You have trouble talking, trouble understanding, or both.  Your face or your eyelid droops on one side. These symptoms may be an emergency. Do not wait to see if the symptoms will go away. Get medical  help right away. Call your local emergency services (911 in the [place] Do not drive yourself to the hospital. This information is not intended to replace advice given to you by your health care provider. Make sure you discuss any questions you have with your health care provider. Document Released: 02/15/2008 Document Revised: 10/14/2015 Document Reviewed: 09/02/2014 Elsevier Interactive Patient Education  2018 [place] Cardioversion Chemical cardioversion, also called pharmacologic cardioversion, is the use of medicine to make an abnormal heart rhythm normal again. You may have this treatment if you have a new abnormal heart rhythm such as atrial fibrillation, or if your abnormal  heart rhythm is causing problems such as shortness of breath. If this treatment is not successful, you may need to have a different kind of cardioversion called electrical cardioversion. Electrical cardioversion is the delivery of a jolt of electricity to restore a normal rhythm to the heart. Tell a health care provider about:  Any allergies you have.  All medicines you are taking, including vitamins, herbs, eye drops, creams, and over-the-counter medicines.  Any problems you or family members have had with anesthetic medicines.  Any blood disorders you have.  Any surgeries you have had.  Any medical conditions you have.  Whether you are pregnant or may be pregnant. What are the risks? Generally, this is a safe procedure. However, problems may occur, including:  Worsening of your abnormal heart rhythm.  An abnormal heart rhythm that is life-threatening.  A stroke from a blood clot.  What happens before the procedure?  Take over-the-counter and prescription medicines only as told by your health care provider. Your health care provider may have you start taking blood-thinning medicines (anticoagulants) so your blood does not clot easily. Your health care provider may also give you medicines to help stabilize your heart rhythm.  Ask your health care provider about changing or stopping your regular medicines. This is especially important if you are taking diabetes medicines or blood thinners.  You may have a test to look for blood clots in your heart (transesophageal echocardiogram, or TEE). In this test, a tube with an instrument is passed down the esophagus. Sound waves (ultrasound) are then used to produce very clear, detailed images of the heart. What happens during the procedure?  You will be given one or more medicines by mouth or through an IV tube in your vein. The number of medicines will depend on your heart rhythm. You may take the medicines while you are at home, in a  clinic, or in the hospital.  Sticky patches (electrodes) or metal paddles may be placed on your chest to monitor your heart. The procedure may vary among health care providers and hospitals. What happens after the procedure?  You may be asked to stay in the hospital so your heart rhythm can be monitored.  Once your heart rhythm is normal again, you may need to take medicines to increase your chances of keeping a regular heart rhythm.  Get help right away if: ? You experience side effects from medicines. ? Your heart rhythm changes. ? You have chest pain or shortness of breath. ? You have symptoms of a stroke, such as:Marland Kitchen  Sudden weakness or numbness in your face, arm, or leg, especially on one side of your body.  Sudden trouble speaking, understanding, or both (aphasia).  Sudden trouble seeing with one or both eyes. These symptoms may represent a serious problem that is an emergency. Do not wait to see if the symptoms  will go away. Get medical help right away. Call your local emergency services (911 in the [place] Do not drive yourself to the hospital. Summary  Chemical cardioversion, also called pharmacologic cardioversion, is the use of medicine to make an abnormal heart rhythm normal again.  You will be given one or more medicines by mouth or through an IV tube in your vein.  Once your heart rhythm is normal again, you may need to take medicines to increase your chances of keeping a regular heart rhythm. This information is not intended to replace advice given to you by your health care provider. Make sure you discuss any questions you have with your health care provider. Document Released: 02/27/2006 Document Revised: 05/12/2016 Document Reviewed: 05/12/2016 Elsevier Interactive Patient Education  2017 [employer]   Chest Wall Pain Chest wall pain is pain in or around the bones and muscles of your chest. Sometimes, an injury causes this pain. Sometimes, the cause may not be  known. This pain may take several weeks or longer to get better. Follow these instructions at home: Pay attention to any changes in your symptoms. Take these actions to help with your pain:  Rest as told by your doctor.  Avoid activities that cause pain. Try not to use your chest, belly (abdominal), or side muscles to lift heavy things.  If directed, apply ice to the painful area: ? Put ice in a plastic bag. ? Place a towel between your skin and the bag. ? Leave the ice on for 20 minutes, 2-3 times per day.  Take over-the-counter and prescription medicines only as told by your doctor.  Do not use tobacco products, including cigarettes, chewing tobacco, and e-cigarettes. If you need help quitting, ask your doctor.  Keep all follow-up visits as told by your doctor. This is important.  Contact a doctor if:  You have a fever.  Your chest pain gets worse.  You have new symptoms. Get help right away if:  You feel sick to your stomach (nauseous) or you throw up (vomit).  You feel sweaty or light-headed.  You have a cough with phlegm (sputum) or you cough up blood.  You are short of breath. This information is not intended to replace advice given to you by your health care provider. Make sure you discuss any questions you have with your health care provider. Document Released: 10/25/2007 Document Revised: 10/14/2015 Document Reviewed: 08/03/2014 Elsevier Interactive Patient Education  2018 [employer].   Electrophysiology Study An electrophysiology (EP) study is a heart test in which thin, flexible tubes (catheters) are placed in a large vein in your groin, arm, neck, or chest. This test is done to evaluate the electrical conduction system of your heart. You may need this test if you have:  Dizziness or fainting.  A fast heartbeat (tachycardia).  A slow heartbeat (bradycardia).  An irregular heartbeat (arrhythmia), such as atrial fibrillation.  Tell a health care provider  about:  Any allergies you have.  All medicines you are taking, including vitamins, herbs, eye drops, creams, and over-the-counter medicines.  Any problems you or family members have had with anesthetic medicines.  Any blood disorders you have.  Any surgeries you have had.  Any medical conditions you have.  Whether you are pregnant or may be pregnant. What are the risks? Generally, this is a safe procedure. However, problems may occur, including:  Tachycardia that does not go away.  Bleeding or bruising around the insertion sites.  Infection.  Temporary or permanent heart  rhythm abnormalities.  Temporary changes in blood pressure.  Puncture (perforation) of the heart wall or a blood vessel. This can cause bleeding between the heart and the sac that surrounds it (cardiac tamponade).  Possible cardiac arrest or fatal heart arrhythmia.  Allergic reactions to medicines or dyes.  Damage to other structures or organs.  What happens before the procedure? Staying hydrated Follow instructions from your health care provider about hydration, which may include:  Up to 2 hours before the procedure - you may continue to drink clear liquids, such as water, clear fruit juice, black coffee, and plain tea.  Eating and drinking restrictions Follow instructions from your health care provider about eating and drinking, which may include:  8 hours before the procedure - stop eating heavy meals or foods such as meat, fried foods, or fatty foods.  6 hours before the procedure - stop eating light meals or foods, such as toast or cereal.  6 hours before the procedure - stop drinking milk or drinks that contain milk.  2 hours before the procedure - stop drinking clear liquids.  Medicines  Ask your health care provider about: ? Changing or stopping your regular medicines. This is especially important if you are taking diabetes medicines or blood thinners. ? Taking medicines such as  aspirin and ibuprofen. These medicines can thin your blood. Do not take these medicines before your procedure if your health care provider instructs you not to.  You may be given antibiotic medicine to help prevent infection. General instructions  Plan to have someone take you home from the hospital or clinic.  If you will be going home right after the procedure, plan to have someone with you for 24 hours.  Ask your health care provider how your surgical site will be marked or identified. What happens during the procedure?  To lower your risk of infection: ? Your health care team will wash or sanitize their hands. ? Your skin will be washed with soap. ? Hair may be removed from the surgical area.  An IV tube will be inserted into one of your veins.  You will be given one or more of the following: ? A medicine to help you relax (sedative). ? A medicine to numb the area (local anesthetic). ? A medicine to make you fall asleep (general anesthetic).  Catheters with an electrode tip will be inserted into a large vein. These electrode tips can measure the heart's electrical activity. They can also use electrical signals to change the heart rhythm.  The catheters will be guided to the heart using a type of X-ray machine (fluoroscopy). Once the catheters are in the heart, they will evaluate the electrical activity of your heart.  If you are awake during the EP study, you may feel dizzy or light-headed. Your heart rate may temporarily increase or you may feel your heart beating hard. Tell your health care provider if you experience these things during the EP study: ? You feel dizzy or nauseous. ? You have chest pain or pressure.  The catheters will be removed.  Firm pressure will be applied to the insertion sites to prevent bleeding.  A bandage (dressing) may be applied over the insertion sites. The procedure may vary among health care providers and hospitals. What happens after the  procedure?  Do not drive for 24 hours if you were given a sedative.  Your blood pressure, heart rate, breathing rate, and blood oxygen level will be monitored until the medicines you were given  have worn off.  You will need to lie flat for a few hours or as told by your health care provider. Keep your legs straight. Do not bend or cross your legs. This information is not intended to replace advice given to you by your health care provider. Make sure you discuss any questions you have with your health care provider. Document Released: 10/26/2009 Document Revised: 12/10/2015 Document Reviewed: 11/15/2015 Elsevier Interactive Patient Education  2018 [employer].   Preventing Atrial Fibrillation-Related Stroke Atrial fibrillation is a common type of irregular or rapid heartbeat (arrhythmia) that greatly increases your risk for a stroke. In atrial fibrillation, the top portions of the heart (atria) beat out of sync with the lower portions of the heart. When the muscles of the atria are tightening in an uncoordinated way (fibrillating), blood can pool in the heart and form clots. If a clot travels to the brain, it can cause a stroke. This type of stroke is preventable. Understanding atrial fibrillation and knowing how to properly manage it can prevent you from having a stroke. What increases my risk for a stroke? If you have atrial fibrillation, you may be at increased risk for a stroke if you also:  Have heart failure.  Have high blood pressure.  Are older than age 30.  Have diabetes.  Have a history of vascular disease, such as heart attack or stroke.  Are male.  If you have atrial fibrillation and you also have one or more of those risk factors, talk with your health care provider about treatments that can prevent a stroke. Other risk factors for a stroke include:  Smoking.  High cholesterol.  Diabetes.  Being inactive (sedentary lifestyle).  Having a family history of  stroke.  Eating a diet that is high in fat, cholesterol, and salt.  What treatments help to manage atrial fibrillation? The main goals of treatment for atrial fibrillation are to prevent blood clots from forming and to keep your heart beating at a normal rate and rhythm. Treatment may include:  Blood-thinning medicine (anticoagulant) that helps to prevent clots from forming. This medicine also increases the risk of bleeding. Talk with your health care provider about the risks and benefits of taking anticoagulants.  Medicine that slows the heart rate or brings the heart rhythm back to normal.  Electrical cardioversion. This is a procedure that resets the heart's rhythm by delivering a controlled, low-energy shock through your skin to your heart.  An ablation procedure, such as catheter ablation, catheter ablation with pacemaker, or surgical ablation. These procedures destroy the heart tissues that send abnormal signals so that heart rhythms can be improved or made normal. A pacemaker is a device that is placed under the skin to help the heart beat in a regular rhythm.  How can I prevent atrial fibrillation-related stroke? Medicines  Take over-the-counter and prescription medicines only as told by your health care provider.  If your health care provider prescribed an anticoagulant, take it exactly as told. Taking too much blood-thinning medicine can cause bleeding. If you do not take enough blood-thinning medicine, you will not have the protection that you need against stroke and other problems. Eating and drinking  Eat healthy foods, including at least 5 servings of fruits and vegetables a day.  Do not drink alcohol.  Do not drink beverages that contain caffeine, such as coffee, soda, and tea.  Follow dietary instructions as told by your health care provider. Managing other medical conditions  Manage and be aware of  your blood pressure. If you have high blood pressure, follow your  treatment plan to keep it in your target range.  Have your cholesterol checked as often as recommended by your health care provider. If you have high cholesterol, follow your treatment plan to lower it and keep it in your target range.  Talk with your health care provider about symptoms to watch for. Some people may not have any symptoms, so it can be hard to know that they have atrial fibrillation. Talk with your health care provider if you experience: ? A feeling that your heart is beating rapidly or irregularly. ? An irregular pulse. ? A feeling of discomfort or pain in your chest. ? Shortness of breath. ? Sudden light-headedness or weakness. ? Tiredness (fatigue) that happens easily during exercise.  If you have obstructive sleep apnea (OSA), manage your condition as told by your health care provider. General instructions  Maintain a healthy weight. Do not use diet pills unless your health care provider approves. Diet pills may make heart problems worse.  Exercise regularly. Get at least 30 minutes of activity on most or all days, or as told by your health care provider.  Do not use any products that contain nicotine or tobacco, such as cigarettes and e-cigarettes. If you need help quitting, ask your health care provider.  Do not use drugs, such as cocaine and amphetamines.  Keep all follow-up visits as told by your health care providers. This is important. These include visits with your heart specialist. Where to find more information: You may find more information about preventing atrial fibrillation-related stroke from:  National Stroke Association (AFib-Stroke Connection): www.stroke.org  Contact a health care provider if:  You notice a change in the rate, rhythm, or strength of your heartbeat.  You have dizziness.  You are taking an anticoagulant and you have more bruises than usual.  You tire out more easily when you exercise or do similar activities. Get help right  away if:  You have chest pain.  You have pain in your abdomen.  You experience unusual sweating or weakness.  You take anticoagulants and you: ? Have severe headaches or confusion. ? Have blood in your vomit, bowel movement, or urine. ? Have bleeding that will not stop. ? Fall or injure your head.  You have any symptoms of a stroke. "BE FAST" is an easy way to remember the main warning signs of a stroke: ? B - Balance. Signs are dizziness, trouble walking, or loss of balance. ? E - Eyes. Signs are trouble seeing or a sudden change in vision. ? F - Face. Signs are sudden weakness or numbness of the face, or the face or eyelid drooping on one side. ? A - Arms. Signs are weakness or numbness in an arm. This happens suddenly and usually on one side of the body. ? S - Speech. Signs are sudden trouble speaking, slurred speech, or trouble understanding what people say. ? T - Time. Time to call emergency services. Write down what time symptoms started.  You have other signs of a stroke, such as: ? A sudden, severe headache with no known cause. ? Nausea or vomiting. ? Seizure. These symptoms may represent a serious problem that is an emergency. Do not wait to see if the symptoms will go away. Get medical help right away. Call your local emergency services (911 in the [place] Do not drive yourself to the hospital. Summary  Having atrial fibrillation increases the risk for a stroke.  Talk with your health care provider about what symptoms to watch for.  Atrial fibrillation-related stroke is preventable. Proper management of atrial fibrillation can prevent you from having a stroke.  Talk with your health care provider about whether anticoagulant medicine is right for you.  Learn the warning signs of a stroke and remember "BE FAST." This information is not intended to replace advice given to you by your health care provider. Make sure you discuss any questions you have with your health care  provider. Document Released: 08/23/2016 Document Revised: 08/23/2016 Document Reviewed: 08/23/2016 Elsevier Interactive Patient Education  2018 [employer].

## 2017-08-11 NOTE — Progress Notes (Signed)
Noted Discharge order and Discharge Summary per Dr. Nevada Crane; patient HR in mid 78s on tele.  Confirmed w/Dr. Allegra Lai (cardiology) that as long as patient is asymptomatic, which he is, Dr. Curt Bears is comfortable w/discharge home.  This Probation officer will continue the discharge process as written.

## 2017-08-11 NOTE — Discharge Summary (Addendum)
Discharge Summary  Erik [name]:[id number] DOB: 06-Aug-1940  PCP: Alroy Dust, L.Marlou Sa, MD  Admit date: 08/10/2017 Discharge date: 08/11/2017  Time spent: 25 MINUTES   Recommendations for Outpatient Follow-up:  1. Follow up with Dr Lovena Le, your cardiologist post hospitalization 2. Follow up with A-fib clinic 3. Take your medications as prescribed  Discharge Diagnoses:  Active Hospital Problems   Diagnosis Date Noted  . Atrial fibrillation with RVR ([place]) 08/10/2017  . Chest pain 08/10/2017  . 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 CAD (coronary artery disease) 10/05/2010  . Essential hypertension 02/03/2009  . PAF (paroxysmal atrial fibrillation) ([place]) 02/03/2009    Resolved Hospital Problems  No resolved problems to display.    Discharge Condition: stable  Diet recommendation: Heart healthy diet   Vitals:   08/11/17 1141 08/11/17 1625  BP: 135/77 139/75  Pulse: (!) 59 (!) 54  Resp: 14 13  Temp: 97.8 F (36.6 C) 98 F (36.7 C)  SpO2: 98% 97%    History of present illness:  Erik [name] is a 77 y.o. male with history of atrial fibrillation with history of ablation, CAD, hypertension presents to the ER with complaints of chest pain and palpitation.  Patient states 3 weeks ago his flecainide was discontinued after patient had lower extremity tingling and numbness concerning for neuropathy.  This evening while having food at [facility] patient started developing chest discomfort with palpitation which persisted. Denies any associated shortness of breath diaphoresis nausea vomiting.  Patient's wife brought patient to the ER.  08/11/17: ACS ruled out. Troponin flat x3. No acute ST T elevations. Afib RVR resolved and converted back to sinus with chemical cardioversion propafenone. Will be discharged on it per cardiology. Follow up with own cardiologist and A-fib clinic. Patient is hemodynamically stable.  HTN Continue home meds  Memory disorder Continue home  meds  Hospital Course:  Principal Problem:   Atrial fibrillation with RVR ([place]) Active Problems:   Essential hypertension   PAF (paroxysmal atrial fibrillation) ([facility])   CAD (coronary artery disease)   Chronotropic incompetence   Chest pain   Procedures:  Chemical cardioversion  Consultations:  Cardiology   Discharge Exam: BP 139/75 (BP Location: Left Arm)   Pulse (!) 54   Temp 98 F (36.7 C) (Oral)   Resp 13   Ht 5\' 11"  (1.803 m)   Wt 96.2 kg (212 lb 1.6 oz)   SpO2 97%   BMI 29.58 kg/m   General: 77 yo CM WD WN NAD A&O x 3  Cardiovascular: RRR no rubs or gallops  Respiratory: CTA no wheezes or rales  Discharge Instructions You were cared for by a hospitalist during your hospital stay. If you have any questions about your discharge medications or the care you received while you were in the hospital after you are discharged, you can call the unit and asked to speak with the hospitalist on call if the hospitalist that took care of you is not available. Once you are discharged, your primary care physician will handle any further medical issues. Please note that NO REFILLS for any discharge medications will be authorized once you are discharged, as it is imperative that you return to your primary care physician (or establish a relationship with a primary care physician if you do not have one) for your aftercare needs so that they can reassess your need for medications and monitor your lab values.  Discharge Instructions    Amb referral to AFIB Clinic   Complete by:  As directed  Allergies as of 08/11/2017      Reactions   Warfarin And Related Other (See Comments)   Patient's INR could never get regulated while on this   Adhesive [tape] Rash, Other (See Comments)   New heart monitor/patch (adhesive) broke out the skin and left a large red patch (still visible after 3 weeks      Medication List    STOP taking these medications   flecainide 150 MG tablet Commonly  known as:  TAMBOCOR     TAKE these medications   acetaminophen 500 MG tablet Commonly known as:  TYLENOL Take 500 mg by mouth every 6 (six) hours as needed (pain). Reported on 05/27/2015   apixaban 5 MG Tabs tablet Commonly known as:  ELIQUIS Take 1 tablet (5 mg total) by mouth 2 (two) times daily.   donepezil 10 MG tablet Commonly known as:  ARICEPT Take 10 mg by mouth at bedtime.   finasteride 5 MG tablet Commonly known as:  PROSCAR Take 5 mg by mouth at bedtime.   INCRUSE ELLIPTA 62.5 MCG/INH Aepb Generic drug:  umeclidinium bromide Take 1 puff by mouth daily.   lisinopril 20 MG tablet Commonly known as:  PRINIVIL,ZESTRIL Take 0.5 tablets (10 mg total) by mouth 2 (two) times daily.   nitroGLYCERIN 0.4 MG SL tablet Commonly known as:  NITROSTAT Place 1 tablet (0.4 mg total) under the tongue every 5 (five) minutes as needed for chest pain (MAX 3 TABLETS).   propafenone 325 MG 12 hr capsule Commonly known as:  RYTHMOL SR Take 1 capsule (325 mg total) by mouth every 12 (twelve) hours.   ZADITOR OP Place 2 drops into both eyes every 8 (eight) hours as needed (for itching).      Allergies  Allergen Reactions  . Warfarin And Related Other (See Comments)    Patient's INR could never get regulated while on this  . Adhesive [Tape] Rash and Other (See Comments)    New heart monitor/patch (adhesive) broke out the skin and left a large red patch (still visible after 3 weeks   Follow-up Information    [name] Lance, MD Follow up.   Specialty:  Cardiology Why:  Dr Lovena Le or someone from the AF clinic will conatct you for follow up in two weeks Contact information: [address] [place] [address] [phone]        Alroy Dust, L.Marlou Sa, MD Follow up in 1 week(s).   Specialty:  Family Medicine Contact information: [address] [place] [address] [phone]            The results of significant diagnostics from this  hospitalization (including imaging, microbiology, ancillary and laboratory) are listed below for reference.    Significant Diagnostic Studies: Dg Chest Port 1 View  Result Date: 08/10/2017 CLINICAL DATA:  Atrial fibrillation with rapid ventricular response, history coronary artery disease, hypertension, stroke, former smoker EXAM: PORTABLE CHEST 1 VIEW COMPARISON:  Portable exam 1935 hours compared to 10/20/2016 FINDINGS: Normal heart size, mediastinal contours, and pulmonary vascularity. Minimal bibasilar atelectasis. Lungs otherwise clear. No acute infiltrate, pleural effusion or pneumothorax. Old LEFT rib fractures. Bones demineralized. IMPRESSION: Minimal bibasilar atelectasis. Electronically Signed   By: Lavonia Dana M.D.   On: 08/10/2017 19:53    Microbiology: Recent Results (from the past 240 hour(s))  MRSA PCR Screening     Status: None   Collection Time: 08/11/17  6:40 AM  Result Value Ref Range Status   MRSA by PCR  NEGATIVE NEGATIVE Final    Comment:        The GeneXpert MRSA Assay (FDA approved for NASAL specimens only), is one component of a comprehensive MRSA colonization surveillance program. It is not intended to diagnose MRSA infection nor to guide or monitor treatment for MRSA infections. Performed at [facility], [place] [address] [place], [place] [address]      Labs: Basic Metabolic Panel: Recent Labs  Lab 08/10/17 1905 08/11/17 0504  NA 140 137  K 4.1 3.9  CL 105 107  CO2 24 22  GLUCOSE 100* 94  BUN 16 14  CREATININE 1.24 0.98  CALCIUM 9.3 8.7*  MG 2.1  --    Liver Function Tests: No results for input(s): AST, ALT, ALKPHOS, BILITOT, PROT, ALBUMIN in the last 168 hours. No results for input(s): LIPASE, AMYLASE in the last 168 hours. No results for input(s): AMMONIA in the last 168 hours. CBC: Recent Labs  Lab 08/10/17 1905 08/11/17 0504  WBC 7.8 7.9  HGB 15.6 14.9  HCT 45.7 44.8  MCV 97.4 97.8  PLT 240 236   Cardiac Enzymes: Recent  Labs  Lab 08/10/17 1905 08/10/17 2332 08/11/17 0504 08/11/17 1136  TROPONINI <0.03 0.03* 0.08* 0.06*   BNP: BNP (last 3 results) No results for input(s): BNP in the last 8760 hours.  ProBNP (last 3 results) No results for input(s): PROBNP in the last 8760 hours.  CBG: No results for input(s): GLUCAP in the last 168 hours.     Signed:  Kayleen Memos, MD [facility] 08/11/2017, 5:45 PM

## 2017-08-11 NOTE — Progress Notes (Signed)
Patient has converted back to baseline of SB in the high 50s after one dose of Rythmol.  Page sent to [place], [place] to notify. EKG confirms.  Will continue to monitor closely.

## 2017-08-14 NOTE — Progress Notes (Signed)
Electrophysiology Office Note Date: 08/21/2017  ID:  Erik [name], DOB [birth year], MRN [id number]  PCP: Alroy Dust, Carlean Jews.Marlou Sa, MD Electrophysiologist: Lovena Le  CC: AF follow up  Erik [name] is a 77 y.o. male seen today for Dr Lovena Le.  He presents today for post hospital follow up. He was admitted in March of 2019 for AF with RVR.  Prior to that admission, Flecainide was stopped 2/2 concerns for peripheral neuropathy. He was started on Propafenone and converted to SR.   Since discharge, the patient reports doing relatively well.  He has back pain and associated peripheral neuropathy. His neuropathy did not improve off Flecainide. The Propafenone costs about $100 for a 15 day supply.   He denies chest pain, palpitations, dyspnea, PND, orthopnea, nausea, vomiting, dizziness, syncope, edema, weight gain, or early satiety.  Past Medical History:  Diagnosis Date  . Arthritis    both knees  . Atrial fibrillation ([facility])    Paroxysmal, s/p PVI 11/11/10  . Atrial flutter ([place])    s/p RFCA  by GT  . Basal cell carcinoma of ear   . CAD (coronary artery disease)    a. cath 5/12: pLAD 25%, D1 25%, EF 65 % (done 2/2 abnormal myoview with inferapical  ischemia)  . Dyslipidemia   . Dysrhythmia   . HTN (hypertension)   . Hx of echocardiogram    Echo (9/15):  Mild LVH, EF 55-60%, no RWMA, normal diast function, mild MR, mod LAE, mildly increased pulmonary pressures (PASP 30 mmHg).  . Hypertension   . Shortness of breath dyspnea    has a low heart rate  . Snoring    a. sleep study 11/2012 neg for OSA; increased leg movement suspicious for primary movement d/o (ie restless leg syndrome)   . Stroke [facility])    mild stroke with  second heart ablation  . TIA (transient ischemic attack)    11/12/10 post afib ablation   Past Surgical History:  Procedure Laterality Date  . atrial flutter ablation     by GT  . basal cell cancer resection    . CARDIOVERSION  10/31/2011   Procedure: CARDIOVERSION;   Surgeon: Jolaine Artist, MD;  Location: [facility] ENDOSCOPY;  Service: Cardiovascular;  Laterality: N/A;  . CATARACT EXTRACTION, BILATERAL    . COLONOSCOPY    . EYE SURGERY     growth under lid- x2  . HEMORRHOID SURGERY    . KNEE ARTHROSCOPY Bilateral    bilateral  . SHOULDER ARTHROSCOPY Right 08/04/2015   Procedure: ARTHROSCOPY RIGHT SHOULDER;  Surgeon: Frederik Pear, MD;  Location: [facility];  Service: Orthopedics;  Laterality: Right;  . TEE WITHOUT CARDIOVERSION  10/31/2011   Procedure: TRANSESOPHAGEAL ECHOCARDIOGRAM (TEE);  Surgeon: Jolaine Artist, MD;  Location[facility] ENDOSCOPY;  Service: Cardiovascular;  Laterality: N/A;    Current Outpatient Medications  Medication Sig Dispense Refill  . acetaminophen (TYLENOL) 500 MG tablet Take 500 mg by mouth every 6 (six) hours as needed (pain). Reported on 05/27/2015    . apixaban (ELIQUIS) 5 MG TABS tablet Take 1 tablet (5 mg total) by mouth 2 (two) times daily. 60 tablet 11  . donepezil (ARICEPT) 10 MG tablet Take 10 mg by mouth at bedtime.  0  . finasteride (PROSCAR) 5 MG tablet Take 5 mg by mouth at bedtime.   12  . INCRUSE ELLIPTA 62.5 MCG/INH AEPB Take 1 puff by mouth daily.  1  . Ketotifen Fumarate (ZADITOR OP) Place 2 drops into both eyes every  8 (eight) hours as needed (for itching).    Marland Kitchen lisinopril (PRINIVIL,ZESTRIL) 20 MG tablet Take 0.5 tablets (10 mg total) by mouth 2 (two) times daily. 90 tablet 3  . nitroGLYCERIN (NITROSTAT) 0.4 MG SL tablet Place 1 tablet (0.4 mg total) under the tongue every 5 (five) minutes as needed for chest pain (MAX 3 TABLETS). 25 tablet 11   No current facility-administered medications for this visit.    Facility-Administered Medications Ordered in Other Visits  Medication Dose Route Frequency Provider Last Rate Last Dose  . gadobenate dimeglumine (MULTIHANCE) injection 20 mL  20 mL Intravenous Once Radiologist, Medication, MD        Allergies:   Warfarin and related and Adhesive [tape]   Social  History: Social History   Socioeconomic History  . Marital status: Divorced    Spouse name: Not on file  . Number of children: 1  . Years of education: Not on file  . Highest education level: Not on file  Occupational History  . 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 Financial resource strain: Not on file  . Food insecurity:    Worry: Not on file    Inability: Not on file  . Transportation needs:    Medical: Not on file    Non-medical: Not on file  Tobacco Use  . Smoking status: Former Smoker    Packs/day: 1.50    Years: 20.00    Pack years: 30.00    Types: Cigarettes    Last attempt to quit: 05/23/1975    Years since quitting: 42.2  . Smokeless tobacco: Never Used  Substance and Sexual Activity  . Alcohol use: No    Alcohol/week: 7.0 oz    Types: 14 Standard drinks or equivalent per week    Comment: has not had a drink in 9 months  . Drug use: No  . Sexual activity: Not on file  Lifestyle  . Physical activity:    Days per week: Not on file    Minutes per session: Not on file  . Stress: Not on file  Relationships  . Social connections:    Talks on phone: Not on file    Gets together: Not on file    Attends religious service: Not on file    Active member of club or organization: Not on file    Attends meetings of clubs or organizations: Not on file    Relationship status: Not on file  . Intimate partner violence:    Fear of current or ex partner: Not on file    Emotionally abused: Not on file    Physically abused: Not on file    Forced sexual activity: Not on file  Other Topics Concern  . Not on file  Social History Narrative  . Not on file    Family History: Family History  Problem Relation Age of Onset  . Hypertension Mother   . Stroke Mother   . Heart disease Mother   . Arthritis Mother   . Heart attack Neg Hx     Review of Systems: All other systems reviewed and are otherwise  negative except as noted above.   Physical Exam: VS:  BP 128/62   Pulse (!) 53   Ht 5\' 11"  (1.803 m)   Wt 215 lb (97.5 kg)   SpO2 97%   BMI 29.99 kg/m  , BMI Body mass index is 29.99 kg/m. Wt Readings  from Last 3 Encounters:  08/21/17 215 lb (97.5 kg)  08/11/17 212 lb 1.6 oz (96.2 kg)  07/24/17 219 lb (99.3 kg)    GEN- The patient is well appearing, alert and oriented x 3 today.   HEENT: normocephalic, atraumatic; sclera clear, conjunctiva pink; hearing intact; oropharynx clear; neck supple  Lungs- Clear to ausculation bilaterally, normal work of breathing.  No wheezes, rales, rhonchi Heart- Regular rate and rhythm  GI- soft, non-tender, non-distended, bowel sounds present Extremities- no clubbing, cyanosis, or edema  MS- no significant deformity or atrophy Skin- warm and dry, no rash or lesion  Psych- euthymic mood, full affect Neuro- strength and sensation are intact   EKG:  EKG is ordered today. The ekg ordered today shows sinus brady, rate 53, PR 194, QRS 98, QTc 426  Recent Labs: 08/10/2017: Magnesium 2.1; TSH 1.034 08/11/2017: BUN 14; Creatinine, Ser 0.98; Hemoglobin 14.9; Platelets 236; Potassium 3.9; Sodium 137    Other studies Reviewed: Additional studies/ records that were reviewed today include: hospital records   Assessment and Plan: 1.  Paroxysmal atrial fibrillation He has maintained SR on propafenone but this medication is expensive.  His peripheral neuropathy did not resolve off Flecainide and I think it was more likely coming from back.  Will stop Propafenone when he runs out of this Rx, then resume Flecainide 150mg  twice daily 72 hours after last dose of Propafenone.  He will follow up with Dr Lovena Le as scheduled for EKG about a week after switching.  Continue [place] for CHADS2VASC of at least 6 Lifestyle modification discussed today with need to decrease ETOH   2.  HTN Stable No change required today   Current medicines are reviewed at length with the  patient today.   The patient does not have concerns regarding his medicines.  The following changes were made today:  none  Labs/ tests ordered today include: none No orders of the defined types were placed in this encounter.    Disposition:   Follow up with Dr Lovena Le as scheduled    Signed, Chanetta Marshall, NP 08/21/2017 9:52 AM   [facility] [address] [place] [address] [phone] (office) [phone] (fax)

## 2017-08-21 ENCOUNTER — Ambulatory Visit: Payer: Medicare Other | Admitting: Nurse Practitioner

## 2017-08-21 ENCOUNTER — Encounter: Payer: Self-pay | Admitting: Nurse Practitioner

## 2017-08-21 VITALS — BP 128/62 | HR 53 | Ht 71.0 in | Wt 215.0 lb

## 2017-08-21 DIAGNOSIS — I10 Essential (primary) hypertension: Secondary | ICD-10-CM

## 2017-08-21 DIAGNOSIS — I480 Paroxysmal atrial fibrillation: Secondary | ICD-10-CM | POA: Diagnosis not present

## 2017-08-21 MED ORDER — FLECAINIDE ACETATE 150 MG PO TABS: 150.0000 mg | ORAL_TABLET | Freq: Two times a day (BID) | ORAL | 5 refills | Status: DC

## 2017-08-21 NOTE — Patient Instructions (Addendum)
Medication Instructions:   AFTER 72  HOURS OF LAST DOSE OF RYTHMOL   START TAKING FLECAINIDE 150 MG TWICE  A DAY   If you need a refill on your cardiac medications before your next appointment, please call your pharmacy.  Labwork: NONE ORDERED  TODAY   Testing/Procedures: NONE ORDERED  TODAY   Follow-Up:  AS SCHEDULED WITH TAYLOR       Any Other Special Instructions Will Be Listed Below (If Applicable).   YO HAVE BEEN RECOMMENDED TO MONITOR YOU ALCOHOL INTAKE

## 2017-09-05 ENCOUNTER — Encounter: Payer: Self-pay | Admitting: Internal Medicine

## 2017-09-05 ENCOUNTER — Ambulatory Visit: Payer: Medicare Other | Admitting: Internal Medicine

## 2017-09-05 VITALS — BP 118/64 | HR 60 | Wt 214.0 lb

## 2017-09-05 DIAGNOSIS — I10 Essential (primary) hypertension: Secondary | ICD-10-CM | POA: Diagnosis not present

## 2017-09-05 DIAGNOSIS — R001 Bradycardia, unspecified: Secondary | ICD-10-CM

## 2017-09-05 DIAGNOSIS — I480 Paroxysmal atrial fibrillation: Secondary | ICD-10-CM

## 2017-09-05 NOTE — Progress Notes (Signed)
HPI Mr. Akter returns today for followup of atrial fib. I saw him several weeks ago and he was c/o symptoms of peripheral neuropathy. I was concerned that the flecainide might be playing a role and we stopped his flecainide. He did not get any better from the neuropathy perspective, and we had switch him to propafenone which was prohibitively expensive and he was restarted back on his usual dose of flecainide150 bid. He returns for followup. He denies chest pain or sob. His neuropathy symptoms appear to have resolved. He denies chest pain or sob.  Allergies  Allergen Reactions  . Adhesive [Tape] Rash and Other (See Comments)    New heart monitor/patch (adhesive) broke out the skin and left a large red patch (still visible after 3 weeks  . Warfarin And Related Other (See Comments)    Patient's INR could never get regulated while on this     Current Outpatient Medications  Medication Sig Dispense Refill  . acetaminophen (TYLENOL) 500 MG tablet Take 500 mg by mouth every 6 (six) hours as needed (pain). Reported on 05/27/2015    . apixaban (ELIQUIS) 5 MG TABS tablet Take 1 tablet (5 mg total) by mouth 2 (two) times daily. 60 tablet 11  . donepezil (ARICEPT) 10 MG tablet Take 10 mg by mouth at bedtime.  0  . finasteride (PROSCAR) 5 MG tablet Take 5 mg by mouth at bedtime.   12  . flecainide (TAMBOCOR) 150 MG tablet Take 1 tablet (150 mg total) by mouth 2 (two) times daily. 60 tablet 5  . INCRUSE ELLIPTA 62.5 MCG/INH AEPB Take 1 puff by mouth daily.  1  . Ketotifen Fumarate (ZADITOR OP) Place 2 drops into both eyes every 8 (eight) hours as needed (for itching).    Marland Kitchen lisinopril (PRINIVIL,ZESTRIL) 20 MG tablet Take 0.5 tablets (10 mg total) by mouth 2 (two) times daily. 90 tablet 3  . nitroGLYCERIN (NITROSTAT) 0.4 MG SL tablet Place 1 tablet (0.4 mg total) under the tongue every 5 (five) minutes as needed for chest pain (MAX 3 TABLETS). 25 tablet 11   No current facility-administered  medications for this visit.    Facility-Administered Medications Ordered in Other Visits  Medication Dose Route Frequency Provider Last Rate Last Dose  . gadobenate dimeglumine (MULTIHANCE) injection 20 mL  20 mL Intravenous Once Radiologist, Medication, MD         Past Medical History:  Diagnosis Date  . Arthritis    both knees  . Atrial fibrillation (HCC)    Paroxysmal, s/p PVI 11/11/10  . Atrial flutter ([place])    s/p RFCA  by GT  . Basal cell carcinoma of ear   . CAD (coronary artery disease)    a. cath 5/12: pLAD 25%, D1 25%, EF 65 % (done 2/2 abnormal myoview with inferapical  ischemia)  . Dyslipidemia   . Dysrhythmia   . HTN (hypertension)   . Hx of echocardiogram    Echo (9/15):  Mild LVH, EF 55-60%, no RWMA, normal diast function, mild MR, mod LAE, mildly increased pulmonary pressures (PASP 30 mmHg).  . Hypertension   . Shortness of breath dyspnea    has a low heart rate  . Snoring    a. sleep study 11/2012 neg for OSA; increased leg movement suspicious for primary movement d/o (ie restless leg syndrome)   . Stroke [facility])    mild stroke with  second heart ablation  . TIA (transient ischemic attack)    11/12/10  post afib ablation    ROS:   All systems reviewed and negative except as noted in the HPI.   Past Surgical History:  Procedure Laterality Date  . atrial flutter ablation     by GT  . basal cell cancer resection    . CARDIOVERSION  10/31/2011   Procedure: CARDIOVERSION;  Surgeon: Jolaine Artist, MD;  Location: [facility] ENDOSCOPY;  Service: Cardiovascular;  Laterality: N/A;  . CATARACT EXTRACTION, BILATERAL    . COLONOSCOPY    . EYE SURGERY     growth under lid- x2  . HEMORRHOID SURGERY    . KNEE ARTHROSCOPY Bilateral    bilateral  . SHOULDER ARTHROSCOPY Right 08/04/2015   Procedure: ARTHROSCOPY RIGHT SHOULDER;  Surgeon: Frederik Pear, MD;  Location: [facility];  Service: Orthopedics;  Laterality: Right;  . TEE WITHOUT CARDIOVERSION  10/31/2011   Procedure:  TRANSESOPHAGEAL ECHOCARDIOGRAM (TEE);  Surgeon: Jolaine Artist, MD;  Location: [facility] ENDOSCOPY;  Service: Cardiovascular;  Laterality: N/A;     Family History  Problem Relation Age of Onset  . Hypertension Mother   . Stroke Mother   . Heart disease Mother   . Arthritis Mother   . Heart attack Neg Hx      Social History   Socioeconomic History  . Marital status: Divorced    Spouse name: Not on file  . Number of children: 1  . Years of education: Not on file  . Highest education level: Not on file  Occupational History  . 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 Financial resource strain: Not on file  . Food insecurity:    Worry: Not on file    Inability: Not on file  . Transportation needs:    Medical: Not on file    Non-medical: Not on file  Tobacco Use  . Smoking status: Former Smoker    Packs/day: 1.50    Years: 20.00    Pack years: 30.00    Types: Cigarettes    Last attempt to quit: 05/23/1975    Years since quitting: 42.3  . Smokeless tobacco: Never Used  Substance and Sexual Activity  . Alcohol use: No    Alcohol/week: 7.0 oz    Types: 14 Standard drinks or equivalent per week    Comment: has not had a drink in 9 months  . Drug use: No  . Sexual activity: Not on file  Lifestyle  . Physical activity:    Days per week: Not on file    Minutes per session: Not on file  . Stress: Not on file  Relationships  . Social connections:    Talks on phone: Not on file    Gets together: Not on file    Attends religious service: Not on file    Active member of club or organization: Not on file    Attends meetings of clubs or organizations: Not on file    Relationship status: Not on file  . Intimate partner violence:    Fear of current or ex partner: Not on file    Emotionally abused: Not on file    Physically abused: Not on file    Forced sexual activity: Not on file  Other Topics Concern  . Not  on file  Social History Narrative  . Not on file     BP 118/64   Pulse 60   Wt 214 lb (97.1 kg)   BMI 29.85  kg/m   Physical Exam:  Well appearing 77 yo man, NAD HEENT: Unremarkable Neck:  7 cm JVD, no thyromegally Lymphatics:  No adenopathy Back:  No CVA tenderness Lungs:  Clear with no wheezes HEART:  Regular rate rhythm, no murmurs, no rubs, no clicks Abd:  soft, positive bowel sounds, no organomegally, no rebound, no guarding Ext:  2 plus pulses, no edema, no cyanosis, no clubbing Skin:  No rashes no nodules Neuro:  CN II through XII intact, motor grossly intact  EKG - nsr with QRS 122  Assess/Plan: 1. PAF - he is maintaining NSR on flecainide and his ecg is acceptable. If his QRS got any wider, then we would reduce the dose. 2. coags - he will continue systemic anti-coag with eliquis. No bleeding 3. HTN - his blood pressure today is well controlled. Will follow.  Mikle Bosworth.D.

## 2017-09-05 NOTE — Patient Instructions (Addendum)

## 2017-12-13 ENCOUNTER — Encounter (INDEPENDENT_AMBULATORY_CARE_PROVIDER_SITE_OTHER): Payer: Medicare Other | Admitting: Ophthalmology

## 2017-12-13 DIAGNOSIS — H35033 Hypertensive retinopathy, bilateral: Secondary | ICD-10-CM

## 2017-12-13 DIAGNOSIS — H43813 Vitreous degeneration, bilateral: Secondary | ICD-10-CM | POA: Diagnosis not present

## 2017-12-13 DIAGNOSIS — I10 Essential (primary) hypertension: Secondary | ICD-10-CM | POA: Diagnosis not present

## 2017-12-13 DIAGNOSIS — H26491 Other secondary cataract, right eye: Secondary | ICD-10-CM

## 2018-01-02 ENCOUNTER — Encounter (INDEPENDENT_AMBULATORY_CARE_PROVIDER_SITE_OTHER): Payer: Medicare Other | Admitting: Ophthalmology

## 2018-01-02 DIAGNOSIS — H2701 Aphakia, right eye: Secondary | ICD-10-CM

## 2018-02-20 ENCOUNTER — Other Ambulatory Visit: Payer: Self-pay | Admitting: Internal Medicine

## 2018-02-20 NOTE — Telephone Encounter (Signed)
Eliquis 5mg  refill request received; pt is 77 yrs old, wt-97.1kg, Crea-0.98 on 08/11/17, last seen by Dr. Lovena Le on 09/05/17; will send in refill to requested pharmacy.

## 2018-03-20 ENCOUNTER — Encounter: Payer: Self-pay | Admitting: Internal Medicine

## 2018-03-20 ENCOUNTER — Ambulatory Visit: Payer: Medicare Other | Admitting: Internal Medicine

## 2018-03-20 VITALS — BP 132/76 | HR 54 | Ht 71.0 in | Wt 215.6 lb

## 2018-03-20 DIAGNOSIS — I480 Paroxysmal atrial fibrillation: Secondary | ICD-10-CM

## 2018-03-20 DIAGNOSIS — I10 Essential (primary) hypertension: Secondary | ICD-10-CM

## 2018-03-20 NOTE — Progress Notes (Signed)
HPI Erik [name] returns today for followup of atrial fib. I saw him several weeks ago and he was c/o symptoms of peripheral neuropathy. I was concerned that the flecainide might be playing a role and we stopped his flecainide. He did not get any better from the neuropathy perspective, and we had switch him to propafenone which was prohibitively expensive and he was restarted back on his usual dose of flecainide150 bid. He returns for followup. He denies chest pain or sob. His neuropathy symptoms appear to have resolved. He denies chest pain or sob. He has not fallen out any deer stands.  Allergies  Allergen Reactions  . Adhesive [Tape] Rash and Other (See Comments)    New heart monitor/patch (adhesive) broke out the skin and left a large red patch (still visible after 3 weeks  . Warfarin And Related Other (See Comments)    Patient's INR could never get regulated while on this     Current Outpatient Medications  Medication Sig Dispense Refill  . acetaminophen (TYLENOL) 500 MG tablet Take 500 mg by mouth every 6 (six) hours as needed (pain). Reported on 05/27/2015    . donepezil (ARICEPT) 10 MG tablet Take 10 mg by mouth at bedtime.  0  . ELIQUIS 5 MG TABS tablet TAKE 1 TABLET BY MOUTH TWICE A DAY 60 tablet 5  . finasteride (PROSCAR) 5 MG tablet Take 5 mg by mouth at bedtime.   12  . flecainide (TAMBOCOR) 150 MG tablet Take 1 tablet (150 mg total) by mouth 2 (two) times daily. 60 tablet 5  . INCRUSE ELLIPTA 62.5 MCG/INH AEPB Take 1 puff by mouth daily.  1  . Ketotifen Fumarate (ZADITOR OP) Place 2 drops into both eyes every 8 (eight) hours as needed (for itching).    Marland Kitchen lisinopril (PRINIVIL,ZESTRIL) 20 MG tablet TAKE 1/2 TABLET BY MOUTH 2 TIMES DAILY 90 tablet 0  . nitroGLYCERIN (NITROSTAT) 0.4 MG SL tablet Place 1 tablet (0.4 mg total) under the tongue every 5 (five) minutes as needed for chest pain (MAX 3 TABLETS). 25 tablet 11   No current facility-administered medications for this  visit.    Facility-Administered Medications Ordered in Other Visits  Medication Dose Route Frequency Provider Last Rate Last Dose  . gadobenate dimeglumine (MULTIHANCE) injection 20 mL  20 mL Intravenous Once Radiologist, Medication, MD         Past Medical History:  Diagnosis Date  . Arthritis    both knees  . Atrial fibrillation (HCC)    Paroxysmal, s/p PVI 11/11/10  . Atrial flutter ([place])    s/p RFCA  by GT  . Basal cell carcinoma of ear   . CAD (coronary artery disease)    a. cath 5/12: pLAD 25%, D1 25%, EF 65 % (done 2/2 abnormal myoview with inferapical  ischemia)  . Dyslipidemia   . Dysrhythmia   . HTN (hypertension)   . Hx of echocardiogram    Echo (9/15):  Mild LVH, EF 55-60%, no RWMA, normal diast function, mild MR, mod LAE, mildly increased pulmonary pressures (PASP 30 mmHg).  . Hypertension   . Shortness of breath dyspnea    has a low heart rate  . Snoring    a. sleep study 11/2012 neg for OSA; increased leg movement suspicious for primary movement d/o (ie restless leg syndrome)   . Stroke [facility])    mild stroke with  second heart ablation  . TIA (transient ischemic attack)    11/12/10 post  afib ablation    ROS:   All systems reviewed and negative except as noted in the HPI.   Past Surgical History:  Procedure Laterality Date  . atrial flutter ablation     by GT  . basal cell cancer resection    . CARDIOVERSION  10/31/2011   Procedure: CARDIOVERSION;  Surgeon: Jolaine Artist, MD;  Location: [facility] ENDOSCOPY;  Service: Cardiovascular;  Laterality: N/A;  . CATARACT EXTRACTION, BILATERAL    . COLONOSCOPY    . EYE SURGERY     growth under lid- x2  . HEMORRHOID SURGERY    . KNEE ARTHROSCOPY Bilateral    bilateral  . SHOULDER ARTHROSCOPY Right 08/04/2015   Procedure: ARTHROSCOPY RIGHT SHOULDER;  Surgeon: Frederik Pear, MD;  Location: [place];  Service: Orthopedics;  Laterality: Right;  . TEE WITHOUT CARDIOVERSION  10/31/2011   Procedure: TRANSESOPHAGEAL  ECHOCARDIOGRAM (TEE);  Surgeon: Jolaine Artist, MD;  Location: [facility] ENDOSCOPY;  Service: Cardiovascular;  Laterality: N/A;     Family History  Problem Relation Age of Onset  . Hypertension Mother   . Stroke Mother   . Heart disease Mother   . Arthritis Mother   . Heart attack Neg Hx      Social History   Socioeconomic History  . Marital status: Divorced    Spouse name: Not on file  . Number of children: 1  . Years of education: Not on file  . Highest education level: Not on file  Occupational History  . 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 Financial resource strain: Not on file  . Food insecurity:    Worry: Not on file    Inability: Not on file  . Transportation needs:    Medical: Not on file    Non-medical: Not on file  Tobacco Use  . Smoking status: Former Smoker    Packs/day: 1.50    Years: 20.00    Pack years: 30.00    Types: Cigarettes    Last attempt to quit: 05/23/1975    Years since quitting: 42.8  . Smokeless tobacco: Never Used  Substance and Sexual Activity  . Alcohol use: No    Alcohol/week: 14.0 standard drinks    Types: 14 Standard drinks or equivalent per week    Comment: has not had a drink in 9 months  . Drug use: No  . Sexual activity: Not on file  Lifestyle  . Physical activity:    Days per week: Not on file    Minutes per session: Not on file  . Stress: Not on file  Relationships  . Social connections:    Talks on phone: Not on file    Gets together: Not on file    Attends religious service: Not on file    Active member of club or organization: Not on file    Attends meetings of clubs or organizations: Not on file    Relationship status: Not on file  . Intimate partner violence:    Fear of current or ex partner: Not on file    Emotionally abused: Not on file    Physically abused: Not on file    Forced sexual activity: Not on file  Other Topics Concern  . Not  on file  Social History Narrative  . Not on file     BP 132/76   Pulse (!) 54   Ht 5\' 11"  (1.803 m)   Wt  215 lb 9.6 oz (97.8 kg)   SpO2 96%   BMI 30.07 kg/m   Physical Exam:  Well appearing NAD HEENT: Unremarkable Neck:  No JVD, no thyromegally Lymphatics:  No adenopathy Back:  No CVA tenderness Lungs:  Clear with no wheezes HEART:  Regular rate rhythm, no murmurs, no rubs, no clicks Abd:  soft, positive bowel sounds, no organomegally, no rebound, no guarding Ext:  2 plus pulses, no edema, no cyanosis, no clubbing Skin:  No rashes no nodules Neuro:  CN II through XII intact, motor grossly intact  EKG - nsr with first degree AV Block  DEVICE  Normal device function.  See PaceArt for details.   Assess/Plan: 1. PAF - he appears to be maintaining NSR on flecainide 2. Sinus node dysfunction - he is asymptomatic.  3. HTN - his bp is minimally elevated. He will continue his current meds.  Erik [name].D.

## 2018-03-20 NOTE — Patient Instructions (Signed)
Medication Instructions:  Your physician recommends that you continue on your current medications as directed. Please refer to the Current Medication list given to you today.  If you need a refill on your cardiac medications before your next appointment, please call your pharmacy.   Lab work: None ordered.  If you have labs (blood work) drawn today and your tests are completely normal, you will receive your results only by: Marland Kitchen MyChart Message (if you have MyChart) OR . A paper copy in the mail If you have any lab test that is abnormal or we need to change your treatment, we will call you to review the results.  Testing/Procedures: None ordered.  Follow-Up:  Your physician wants you to follow-up in: one year with Dr. Lovena Le.    You will receive a reminder letter in the mail two months in advance. If you don't receive a letter, please call our office to schedule the follow-up appointment.  At [facility], you and your health needs are our priority.  As part of our continuing mission to provide you with exceptional heart care, we have created designated Provider Care Teams.  These Care Teams include your primary Cardiologist (physician) and Advanced Practice Providers (APPs -  Physician Assistants and Nurse Practitioners) who all work together to provide you with the care you need, when you need it.  Any Other Special Instructions Will Be Listed Below (If Applicable).

## 2018-03-21 ENCOUNTER — Other Ambulatory Visit: Payer: Self-pay | Admitting: Internal Medicine

## 2018-05-27 ENCOUNTER — Other Ambulatory Visit: Payer: Self-pay | Admitting: Nurse Practitioner

## 2018-06-20 ENCOUNTER — Other Ambulatory Visit: Payer: Self-pay | Admitting: Internal Medicine

## 2018-07-09 ENCOUNTER — Telehealth: Payer: Self-pay | Admitting: Internal Medicine

## 2018-07-09 NOTE — Telephone Encounter (Signed)
Called Erik [name] to find out if letter needed to say anything in particular or just state that pt needs to take Eliquis?  Advised Dr. Lovena Le out of office today but will send message to his nurse as well to f/u on letter.

## 2018-07-09 NOTE — Telephone Encounter (Signed)
  Patient needs a letter from Dr Lovena Le stating that he needs to take ELIQUIS 5 MG TABS tablet faxed to the [facility] so that he can get his medication through them cheaper. The fax number is [phone] Attn: Dr Theda Sers.

## 2018-07-10 NOTE — Telephone Encounter (Signed)
Letter faxed as requested

## 2018-07-10 NOTE — Telephone Encounter (Signed)
Follow up    [name] is calling on behalf of patient. She wants to make sure that the letter indicates as to why the patient needs to take the Eliquis.

## 2019-03-12 ENCOUNTER — Telehealth: Payer: Self-pay | Admitting: Internal Medicine

## 2019-03-12 NOTE — Telephone Encounter (Signed)
Encounter not needed

## 2019-03-17 ENCOUNTER — Other Ambulatory Visit: Payer: Self-pay | Admitting: Internal Medicine

## 2019-03-17 IMAGING — DX DG CHEST 1V PORT
1 series · 1 of 1 positions shown · non-contrast
Comparison: Portable exam 6209 hours compared to 10/20/2016

CLINICAL DATA: Atrial fibrillation with rapid ventricular response,
history coronary artery disease, hypertension, stroke, former smoker

EXAM:
PORTABLE CHEST 1 VIEW

[chest ap]
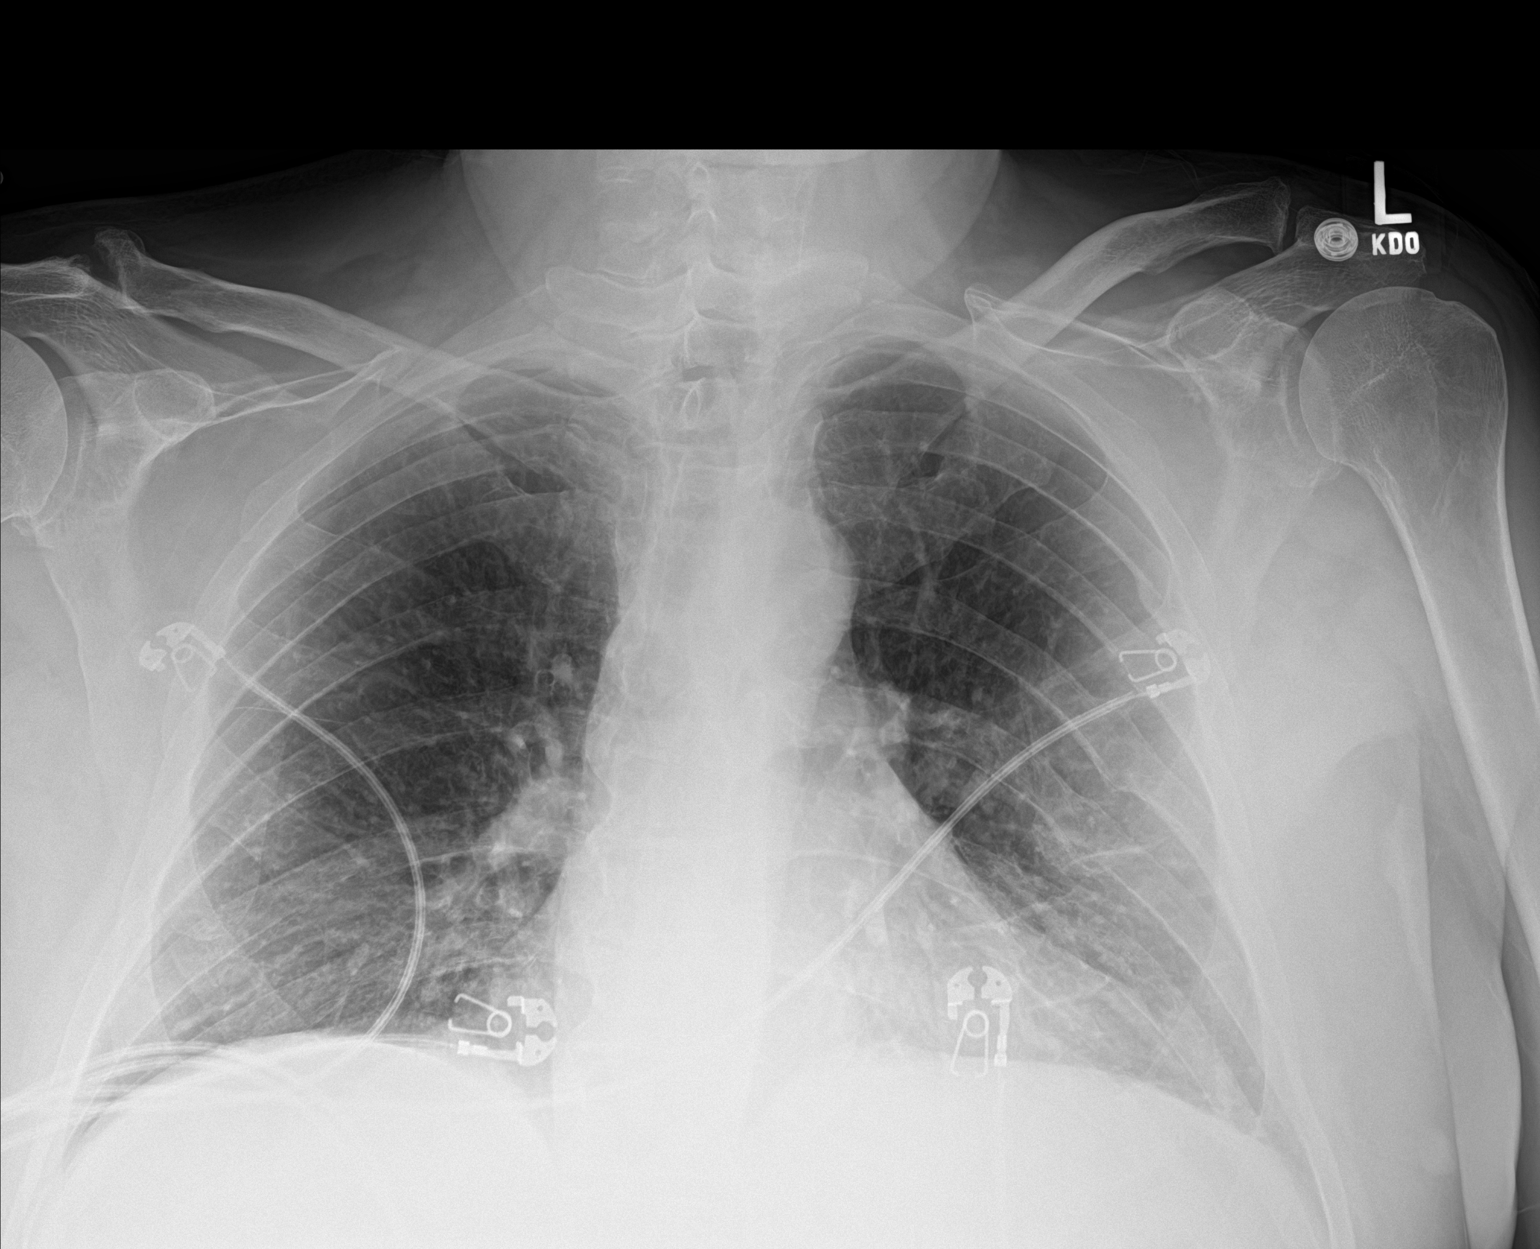

[1 of 1 positions shown; findings below may reference images not displayed]

FINDINGS: Normal heart size, mediastinal contours, and pulmonary vascularity.

Minimal bibasilar atelectasis.

Lungs otherwise clear.

No acute infiltrate, pleural effusion or pneumothorax.

Old LEFT rib fractures.

Bones demineralized.
IMPRESSION: Minimal bibasilar atelectasis.

## 2019-03-17 NOTE — Telephone Encounter (Signed)
°*  STAT* If patient is at the pharmacy, call can be transferred to refill team.   1. Which medications need to be refilled? (please list name of each medication and dose if known)  flecainide (TAMBOCOR) 150 MG tablet lisinopril (PRINIVIL,ZESTRIL) 20 MG tablet  2. Which pharmacy/location (including street and city if local pharmacy) is medication to be sent to?  [facility] [phone] - [place], [place] - [address] AT [address]  3. Do they need a 30 day or 90 day supply? [place]

## 2019-03-18 MED ORDER — LISINOPRIL 20 MG PO TABS: ORAL_TABLET | ORAL | 0 refills | Status: DC

## 2019-03-18 MED ORDER — FLECAINIDE ACETATE 150 MG PO TABS: ORAL_TABLET | ORAL | 0 refills | Status: DC

## 2019-03-18 NOTE — Telephone Encounter (Signed)
Pt's medications were sent to pt's pharmacy as requested. Confirmation received.  

## 2019-06-03 ENCOUNTER — Telehealth: Payer: Self-pay | Admitting: Internal Medicine

## 2019-06-03 NOTE — Telephone Encounter (Signed)
Advised wife ok to accompany Pt.  Pt has dementia

## 2019-06-03 NOTE — Telephone Encounter (Signed)
New Message:     Wife called and wants to know if she can with pt for his appt on Thursday to see Dr Lovena Le? She said pt have had a stroke and it is hard for him to communicate and remember.

## 2019-06-05 ENCOUNTER — Encounter: Payer: Self-pay | Admitting: Internal Medicine

## 2019-06-05 ENCOUNTER — Other Ambulatory Visit: Payer: Self-pay

## 2019-06-05 ENCOUNTER — Ambulatory Visit: Payer: Medicare Other | Admitting: Internal Medicine

## 2019-06-05 VITALS — BP 140/84 | HR 57 | Ht 71.0 in | Wt 200.6 lb

## 2019-06-05 DIAGNOSIS — I10 Essential (primary) hypertension: Secondary | ICD-10-CM

## 2019-06-05 DIAGNOSIS — I480 Paroxysmal atrial fibrillation: Secondary | ICD-10-CM

## 2019-06-05 NOTE — Patient Instructions (Signed)

## 2019-06-05 NOTE — Progress Notes (Signed)
HPI [name] returns today for followup. He is a pleasant 79 yo man with a h/o PAF and peripheral neuropathy. We had stopped his flecainide thinking that it may have been contributing to his neuropathy but he did not improve. He is back on flecainide. He has not had any palpitations or syncope. No chest pain or sob. He c/o weakness. His wife who is with him today thinks that he needs to be more active.  Allergies  Allergen Reactions  . Adhesive [Tape] Rash and Other (See Comments)    New heart monitor/patch (adhesive) broke out the skin and left a large red patch (still visible after 3 weeks  . Warfarin And Related Other (See Comments)    Patient's INR could never get regulated while on this     Current Outpatient Medications  Medication Sig Dispense Refill  . acetaminophen (TYLENOL) 500 MG tablet Take 500 mg by mouth every 6 (six) hours as needed (pain). Reported on 05/27/2015    . donepezil (ARICEPT) 10 MG tablet Take 10 mg by mouth at bedtime.  0  . ELIQUIS 5 MG TABS tablet TAKE 1 TABLET BY MOUTH TWICE A DAY 60 tablet 11  . finasteride (PROSCAR) 5 MG tablet Take 5 mg by mouth at bedtime.   12  . flecainide (TAMBOCOR) 150 MG tablet TAKE 1 TABLET(150 MG) BY MOUTH TWICE DAILY 180 tablet 0  . INCRUSE ELLIPTA 62.5 MCG/INH AEPB Take 1 puff by mouth daily.  1  . Ketotifen Fumarate (ZADITOR OP) Place 2 drops into both eyes every 8 (eight) hours as needed (for itching).    Marland Kitchen lisinopril (ZESTRIL) 20 MG tablet TAKE 1/2 TABLET BY MOUTH 2 TIMES DAILY 90 tablet 0  . nitroGLYCERIN (NITROSTAT) 0.4 MG SL tablet Place 1 tablet (0.4 mg total) under the tongue every 5 (five) minutes as needed for chest pain (MAX 3 TABLETS). 25 tablet 11   No current facility-administered medications for this visit.   Facility-Administered Medications Ordered in Other Visits  Medication Dose Route Frequency Provider Last Rate Last Admin  . gadobenate dimeglumine (MULTIHANCE) injection 20 mL  20 mL Intravenous  Once Radiologist, Medication, MD         Past Medical History:  Diagnosis Date  . Arthritis    both knees  . Atrial fibrillation (HCC)    Paroxysmal, s/p PVI 11/11/10  . Atrial flutter ([place])    s/p RFCA  by GT  . Basal cell carcinoma of ear   . CAD (coronary artery disease)    a. cath 5/12: pLAD 25%, D1 25%, EF 65 % (done 2/2 abnormal myoview with inferapical  ischemia)  . Dyslipidemia   . Dysrhythmia   . HTN (hypertension)   . Hx of echocardiogram    Echo (9/15):  Mild LVH, EF 55-60%, no RWMA, normal diast function, mild MR, mod LAE, mildly increased pulmonary pressures (PASP 30 mmHg).  . Hypertension   . Shortness of breath dyspnea    has a low heart rate  . Snoring    a. sleep study 11/2012 neg for OSA; increased leg movement suspicious for primary movement d/o (ie restless leg syndrome)   . Stroke [facility])    mild stroke with  second heart ablation  . TIA (transient ischemic attack)    11/12/10 post afib ablation    ROS:   All systems reviewed and negative except as noted in the HPI.   Past Surgical History:  Procedure Laterality Date  . atrial flutter  ablation     by GT  . basal cell cancer resection    . CARDIOVERSION  10/31/2011   Procedure: CARDIOVERSION;  Surgeon: Jolaine Artist, MD;  Location: [facility] ENDOSCOPY;  Service: Cardiovascular;  Laterality: N/A;  . CATARACT EXTRACTION, BILATERAL    . COLONOSCOPY    . EYE SURGERY     growth under lid- x2  . HEMORRHOID SURGERY    . KNEE ARTHROSCOPY Bilateral    bilateral  . SHOULDER ARTHROSCOPY Right 08/04/2015   Procedure: ARTHROSCOPY RIGHT SHOULDER;  Surgeon: Frederik Pear, MD;  Location: [place];  Service: Orthopedics;  Laterality: Right;  . TEE WITHOUT CARDIOVERSION  10/31/2011   Procedure: TRANSESOPHAGEAL ECHOCARDIOGRAM (TEE);  Surgeon: Jolaine Artist, MD;  Location: [facility] ENDOSCOPY;  Service: Cardiovascular;  Laterality: N/A;     Family History  Problem Relation Age of Onset  . Hypertension Mother   . Stroke  Mother   . Heart disease Mother   . Arthritis Mother   . Heart attack Neg Hx      Social History   Socioeconomic History  . Marital status: Divorced    Spouse name: Not on file  . Number of children: 1  . Years of education: Not on file  . Highest education level: Not on file  Occupational History  . 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 Smoking status: Former Smoker    Packs/day: 1.50    Years: 20.00    Pack years: 30.00    Types: Cigarettes    Quit date: 05/23/1975    Years since quitting: 44.0  . Smokeless tobacco: Never Used  Substance and Sexual Activity  . Alcohol use: No    Alcohol/week: 14.0 standard drinks    Types: 14 Standard drinks or equivalent per week    Comment: has not had a drink in 9 months  . Drug use: No  . Sexual activity: Not on file  Other Topics Concern  . Not on file  Social History Narrative  . Not on file   Social Determinants of Health   Financial Resource Strain:   . Difficulty of Paying Living Expenses: Not on file  Food Insecurity:   . Worried About Charity fundraiser in the Last Year: Not on file  . Ran Out of Food in the Last Year: Not on file  Transportation Needs:   . Lack of Transportation (Medical): Not on file  . Lack of Transportation (Non-Medical): Not on file  Physical Activity:   . Days of Exercise per Week: Not on file  . Minutes of Exercise per Session: Not on file  Stress:   . Feeling of Stress : Not on file  Social Connections:   . Frequency of Communication with Friends and Family: Not on file  . Frequency of Social Gatherings with Friends and Family: Not on file  . Attends Religious Services: Not on file  . Active Member of Clubs or Organizations: Not on file  . Attends Archivist Meetings: Not on file  . Marital Status: Not on file  Intimate Partner Violence:   . Fear of Current or Ex-Partner: Not on file  . Emotionally Abused:  Not on file  . Physically Abused: Not on file  . Sexually Abused: Not on file     BP 140/84   Pulse (!) 57   Ht 5\' 11"  (1.803 m)   Wt 200 lb 9.6 oz (  91 kg)   SpO2 98%   BMI 27.98 kg/m   Physical Exam:  Well appearing NAD HEENT: Unremarkable Neck:  No JVD, no thyromegally Lymphatics:  No adenopathy Back:  No CVA tenderness Lungs:  Clear with no wheezes HEART:  Regular rate rhythm, no murmurs, no rubs, no clicks Abd:  soft, positive bowel sounds, no organomegally, no rebound, no guarding Ext:  2 plus pulses, no edema, no cyanosis, no clubbing Skin:  No rashes no nodules Neuro:  CN II through XII intact, motor grossly intact  EKG - nsr  Assess/Plan: 1. PAF - he is maintaining NSR on flecainide. He will continue. His ECG looks good and does not suggest he is getting too much flecainide. 2. HTN - his bp is minimally elevated.  3. coags - he is doing well on eliquis. 4. Dementia - he will continue Aricept. I do not sense that his symptoms have worsened.  Mikle Bosworth.D.

## 2019-06-16 ENCOUNTER — Other Ambulatory Visit: Payer: Self-pay | Admitting: Internal Medicine

## 2019-06-17 ENCOUNTER — Ambulatory Visit: Payer: Medicare Other

## 2019-06-19 ENCOUNTER — Ambulatory Visit: Payer: Medicare Other

## 2019-06-26 ENCOUNTER — Ambulatory Visit: Payer: Medicare Other | Attending: Internal Medicine

## 2019-06-26 DIAGNOSIS — Z23 Encounter for immunization: Secondary | ICD-10-CM | POA: Insufficient documentation

## 2019-06-26 NOTE — Progress Notes (Signed)
   Covid-19 Vaccination Clinic  Name:  Erik [name]    MRN: [id number] DOB: 1940-12-25  06/26/2019  Mr. Koudelka was observed post Covid-19 immunization for 15 minutes without incidence. He was provided with Vaccine Information Sheet and instruction to access the V-Safe system.   Mr. Norsworthy was instructed to call 911 with any severe reactions post vaccine: Marland Kitchen Difficulty breathing  . Swelling of your face and throat  . A fast heartbeat  . A bad rash all over your body  . Dizziness and weakness    Immunizations Administered    Name Date Dose VIS Date Route   Pfizer COVID-19 Vaccine 06/26/2019  4:47 PM 0.3 mL 05/02/2019 Intramuscular   Manufacturer: [place]   Lot: [id number]   [place]: [id number]

## 2019-07-04 ENCOUNTER — Ambulatory Visit: Payer: Medicare Other

## 2019-07-06 ENCOUNTER — Ambulatory Visit: Payer: Medicare Other

## 2019-07-21 ENCOUNTER — Ambulatory Visit: Payer: Medicare Other | Attending: Internal Medicine

## 2019-07-21 DIAGNOSIS — Z23 Encounter for immunization: Secondary | ICD-10-CM | POA: Insufficient documentation

## 2019-07-21 NOTE — Progress Notes (Signed)
   Covid-19 Vaccination Clinic  Name:  PINK MOREIN    MRN: [id number] DOB: 1941-01-23  07/21/2019  Mr. Anglemyer was observed post Covid-19 immunization for 15 minutes without incidence. He was provided with Vaccine Information Sheet and instruction to access the V-Safe system.   Mr. Weatherington was instructed to call 911 with any severe reactions post vaccine: Marland Kitchen Difficulty breathing  . Swelling of your face and throat  . A fast heartbeat  . A bad rash all over your body  . Dizziness and weakness    Immunizations Administered    Name Date Dose VIS Date Route   Pfizer COVID-19 Vaccine 07/21/2019  4:45 PM 0.3 mL 05/02/2019 Intramuscular   Manufacturer: [place]   Lot: [id number]   [place]: [id number]

## 2020-05-21 ENCOUNTER — Other Ambulatory Visit: Payer: Self-pay

## 2020-05-21 ENCOUNTER — Emergency Department (HOSPITAL_COMMUNITY): Payer: Medicare Other

## 2020-05-21 ENCOUNTER — Emergency Department (HOSPITAL_COMMUNITY)
Admission: EM | Admit: 2020-05-21 | Discharge: 2020-05-21 | Disposition: A | Discharge status: Home / Self Care | Payer: Medicare Other | Attending: Emergency Medicine | Admitting: Emergency Medicine

## 2020-05-21 ENCOUNTER — Encounter (HOSPITAL_COMMUNITY): Payer: Self-pay

## 2020-05-21 DIAGNOSIS — Y909 Presence of alcohol in blood, level not specified: Secondary | ICD-10-CM | POA: Insufficient documentation

## 2020-05-21 DIAGNOSIS — W01198A Fall on same level from slipping, tripping and stumbling with subsequent striking against other object, initial encounter: Secondary | ICD-10-CM | POA: Diagnosis not present

## 2020-05-21 DIAGNOSIS — S0993XA Unspecified injury of face, initial encounter: Secondary | ICD-10-CM | POA: Diagnosis present

## 2020-05-21 DIAGNOSIS — U071 COVID-19: Secondary | ICD-10-CM

## 2020-05-21 DIAGNOSIS — M79641 Pain in right hand: Secondary | ICD-10-CM | POA: Diagnosis not present

## 2020-05-21 DIAGNOSIS — I480 Paroxysmal atrial fibrillation: Secondary | ICD-10-CM | POA: Diagnosis not present

## 2020-05-21 DIAGNOSIS — Z7901 Long term (current) use of anticoagulants: Secondary | ICD-10-CM | POA: Insufficient documentation

## 2020-05-21 DIAGNOSIS — Z79899 Other long term (current) drug therapy: Secondary | ICD-10-CM | POA: Insufficient documentation

## 2020-05-21 DIAGNOSIS — I119 Hypertensive heart disease without heart failure: Secondary | ICD-10-CM | POA: Insufficient documentation

## 2020-05-21 DIAGNOSIS — T1490XA Injury, unspecified, initial encounter: Secondary | ICD-10-CM

## 2020-05-21 DIAGNOSIS — S0083XA Contusion of other part of head, initial encounter: Secondary | ICD-10-CM | POA: Insufficient documentation

## 2020-05-21 DIAGNOSIS — I2510 Atherosclerotic heart disease of native coronary artery without angina pectoris: Secondary | ICD-10-CM | POA: Diagnosis not present

## 2020-05-21 DIAGNOSIS — Z85828 Personal history of other malignant neoplasm of skin: Secondary | ICD-10-CM | POA: Diagnosis not present

## 2020-05-21 DIAGNOSIS — W010XXA Fall on same level from slipping, tripping and stumbling without subsequent striking against object, initial encounter: Secondary | ICD-10-CM

## 2020-05-21 LAB — COMPREHENSIVE METABOLIC PANEL
ALT: 17 U/L (ref 0–44)
AST: 34 U/L (ref 15–41)
Albumin: 3.7 g/dL (ref 3.5–5.0)
Alkaline Phosphatase: 52 U/L (ref 38–126)
Anion gap: 13 (ref 5–15)
BUN: 17 mg/dL (ref 8–23)
CO2: 24 mmol/L (ref 22–32)
Calcium: 9 mg/dL (ref 8.9–10.3)
Chloride: 97 mmol/L — ABNORMAL LOW (ref 98–111)
Creatinine, Ser: 1.38 mg/dL — ABNORMAL HIGH (ref 0.61–1.24)
GFR, Estimated: 52 mL/min — ABNORMAL LOW (ref >60)
Glucose, Bld: 100 mg/dL — ABNORMAL HIGH (ref 70–99)
Potassium: 4.1 mmol/L (ref 3.5–5.1)
Sodium: 134 mmol/L — ABNORMAL LOW (ref 135–145)
Total Bilirubin: 1.1 mg/dL (ref 0.3–1.2)
Total Protein: 6.8 g/dL (ref 6.5–8.1)

## 2020-05-21 LAB — I-STAT BETA HCG BLOOD, ED (MC, WL, AP ONLY): I-stat hCG, quantitative: <5 m[IU]/mL (ref <5)

## 2020-05-21 LAB — SAMPLE TO BLOOD BANK

## 2020-05-21 LAB — URINALYSIS, ROUTINE W REFLEX MICROSCOPIC
Bacteria, UA: NONE SEEN
Bilirubin Urine: NEGATIVE
Glucose, UA: NEGATIVE mg/dL
Ketones, ur: 20 mg/dL — AB
Leukocytes,Ua: NEGATIVE
Nitrite: NEGATIVE
Protein, ur: NEGATIVE mg/dL
Specific Gravity, Urine: 1.018 (ref 1.005–1.030)
pH: 5 (ref 5.0–8.0)

## 2020-05-21 LAB — I-STAT CHEM 8, ED
BUN: 22 mg/dL (ref 8–23)
Calcium, Ion: 1.13 mmol/L — ABNORMAL LOW (ref 1.15–1.40)
Chloride: 98 mmol/L (ref 98–111)
Creatinine, Ser: 1.3 mg/dL — ABNORMAL HIGH (ref 0.61–1.24)
Glucose, Bld: 95 mg/dL (ref 70–99)
HCT: 48 % (ref 39.0–52.0)
Hemoglobin: 16.3 g/dL (ref 13.0–17.0)
Potassium: 4.2 mmol/L (ref 3.5–5.1)
Sodium: 136 mmol/L (ref 135–145)
TCO2: 28 mmol/L (ref 22–32)

## 2020-05-21 LAB — CBC
HCT: 47.3 % (ref 39.0–52.0)
Hemoglobin: 15.2 g/dL (ref 13.0–17.0)
MCH: 32.3 pg (ref 26.0–34.0)
MCHC: 32.1 g/dL (ref 30.0–36.0)
MCV: 100.4 fL — ABNORMAL HIGH (ref 80.0–100.0)
Platelets: 173 10*3/uL (ref 150–400)
RBC: 4.71 MIL/uL (ref 4.22–5.81)
RDW: 12.3 % (ref 11.5–15.5)
WBC: 7.3 10*3/uL (ref 4.0–10.5)
nRBC: 0 % (ref 0.0–0.2)

## 2020-05-21 LAB — ETHANOL: Alcohol, Ethyl (B): <10 mg/dL (ref <10)

## 2020-05-21 LAB — RESP PANEL BY RT-PCR (FLU A&B, COVID) ARPGX2
Influenza A by PCR: NEGATIVE
Influenza B by PCR: NEGATIVE
SARS Coronavirus 2 by RT PCR: POSITIVE — AB

## 2020-05-21 LAB — PROTIME-INR
INR: 1.3 — ABNORMAL HIGH (ref 0.8–1.2)
Prothrombin Time: 15.6 seconds — ABNORMAL HIGH (ref 11.4–15.2)

## 2020-05-21 MED ORDER — ACETAMINOPHEN 325 MG PO TABS
650.0000 mg | ORAL_TABLET | Freq: Once | ORAL | Status: AC
  Administered 2020-05-21: 650 mg via ORAL
  Filled 2020-05-21: qty 2

## 2020-05-21 MED ORDER — FAMOTIDINE 20 MG IN NS 100 ML IVPB: 20.0000 mg | Freq: Once | INTRAVENOUS | Status: DC | PRN

## 2020-05-21 MED ORDER — ALBUTEROL SULFATE HFA 108 (90 BASE) MCG/ACT IN AERS: 2.0000 | INHALATION_SPRAY | Freq: Once | RESPIRATORY_TRACT | Status: DC | PRN

## 2020-05-21 MED ORDER — SODIUM CHLORIDE 0.9 % IV SOLN: 1200.0000 mg | Freq: Once | INTRAVENOUS | Status: DC

## 2020-05-21 MED ORDER — DIPHENHYDRAMINE HCL 50 MG/ML IJ SOLN: 50.0000 mg | Freq: Once | INTRAMUSCULAR | Status: DC | PRN

## 2020-05-21 MED ORDER — METHYLPREDNISOLONE SODIUM SUCC 125 MG IJ SOLR: 125.0000 mg | Freq: Once | INTRAMUSCULAR | Status: DC | PRN

## 2020-05-21 MED ORDER — SODIUM CHLORIDE 0.9 % IV SOLN: INTRAVENOUS | Status: DC | PRN

## 2020-05-21 MED ORDER — EPINEPHRINE 0.3 MG/0.3ML IJ SOAJ: 0.3000 mg | Freq: Once | INTRAMUSCULAR | Status: DC | PRN

## 2020-05-21 NOTE — ED Provider Notes (Addendum)
[facility]ERGENCY DEPARTMENT Provider Note   CSN: [id number] Arrival date & time: 05/21/20  0444     History Chief Complaint  Patient presents with  . Fall    Erik [name] is a 79 y.o. male.  Patient presents to the emergency department after a fall.  Patient had a fall witnessed by his wife.  Fall was a ground-level secondary to his generalized weakness.  He does take Eliquis because of atrial fibrillation.  He did hit his head but did not lose consciousness. Complains of right hand pain. Patient has had cough and congestion, had a positive Covid test yesterday.  He does feel short of breath.        Past Medical History:  Diagnosis Date  . Arthritis    both knees  . Atrial fibrillation (HCC)    Paroxysmal, s/p PVI 11/11/10  . Atrial flutter ([place])    s/p RFCA  by GT  . Basal cell carcinoma of ear   . CAD (coronary artery disease)    a. cath 5/12: pLAD 25%, D1 25%, EF 65 % (done 2/2 abnormal myoview with inferapical  ischemia)  . Dyslipidemia   . Dysrhythmia   . HTN (hypertension)   . Hx of echocardiogram    Echo (9/15):  Mild LVH, EF 55-60%, no RWMA, normal diast function, mild MR, mod LAE, mildly increased pulmonary pressures (PASP 30 mmHg).  . Hypertension   . Shortness of breath dyspnea    has a low heart rate  . Snoring    a. sleep study 11/2012 neg for OSA; increased leg movement suspicious for primary movement d/o (ie restless leg syndrome)   . Stroke [facility])    mild stroke with  second heart ablation  . TIA (transient ischemic attack)    11/12/10 post afib ablation    Patient Active Problem List   Diagnosis Date Noted  . Atrial fibrillation with RVR ([place]) 08/10/2017  . Chest pain 08/10/2017  . ETOH abuse 02/16/2015  . Dyspnea 09/01/2014  . Snoring 09/01/2014  . Periodic limb movement disorder (PLMD) 09/01/2014  . 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 TIA (transient ischemic attack) 11/18/2010  . CAD (coronary artery disease)  10/05/2010  . Essential hypertension 02/03/2009  . PAF (paroxysmal atrial fibrillation) ([place]) 02/03/2009  . Atrial flutter ([place]) 02/03/2009    Past Surgical History:  Procedure Laterality Date  . atrial flutter ablation     by GT  . basal cell cancer resection    . CARDIOVERSION  10/31/2011   Procedure: CARDIOVERSION;  Surgeon: Jolaine Artist, MD;  Location: [facility] ENDOSCOPY;  Service: Cardiovascular;  Laterality: N/A;  . CATARACT EXTRACTION, BILATERAL    . COLONOSCOPY    . EYE SURGERY     growth under lid- x2  . HEMORRHOID SURGERY    . KNEE ARTHROSCOPY Bilateral    bilateral  . SHOULDER ARTHROSCOPY Right 08/04/2015   Procedure: ARTHROSCOPY RIGHT SHOULDER;  Surgeon: Frederik Pear, MD;  Location: [place];  Service: Orthopedics;  Laterality: Right;  . TEE WITHOUT CARDIOVERSION  10/31/2011   Procedure: TRANSESOPHAGEAL ECHOCARDIOGRAM (TEE);  Surgeon: Jolaine Artist, MD;  Location: [facility] ENDOSCOPY;  Service: Cardiovascular;  Laterality: N/A;       Family History  Problem Relation Age of Onset  . Hypertension Mother   . Stroke Mother   . Heart disease Mother   . Arthritis Mother   . Heart attack Neg Hx     Social History   Tobacco Use  .  Smoking status: Former Smoker    Packs/day: 1.50    Years: 20.00    Pack years: 30.00    Types: Cigarettes    Quit date: 05/23/1975    Years since quitting: 45.0  . Smokeless tobacco: Never Used  Vaping Use  . Vaping Use: Never used  Substance Use Topics  . Alcohol use: No    Alcohol/week: 14.0 standard drinks    Types: 14 Standard drinks or equivalent per week    Comment: has not had a drink in 9 months  . Drug use: No    Home Medications Prior to Admission medications   Medication Sig Start Date End Date Taking? Authorizing Provider  acetaminophen (TYLENOL) 500 MG tablet Take 500 mg by mouth every 6 (six) hours as needed (pain). Reported on 05/27/2015    [provider]  donepezil (ARICEPT) 10 MG tablet Take 10 mg by mouth at  bedtime. 11/27/16   [provider]  ELIQUIS 5 MG TABS tablet TAKE 1 TABLET BY MOUTH TWICE A DAY 03/21/18   Marinus Maw, MD  finasteride (PROSCAR) 5 MG tablet Take 5 mg by mouth at bedtime.  08/08/17   [provider]  flecainide (TAMBOCOR) 150 MG tablet TAKE 1 TABLET(150 MG) BY MOUTH TWICE DAILY 06/18/19   Marinus Maw, MD  INCRUSE ELLIPTA 62.5 MCG/INH AEPB Take 1 puff by mouth daily. 01/29/17   [provider]  Ketotifen Fumarate (ZADITOR OP) Place 2 drops into both eyes every 8 (eight) hours as needed (for itching).    [provider]  lisinopril (ZESTRIL) 20 MG tablet TAKE 1/2 TABLET BY MOUTH TWICE DAILY 06/18/19   Marinus Maw, MD  nitroGLYCERIN (NITROSTAT) 0.4 MG SL tablet Place 1 tablet (0.4 mg total) under the tongue every 5 (five) minutes as needed for chest pain (MAX 3 TABLETS). 05/05/14   Marinus Maw, MD    Allergies    Adhesive [tape] and Warfarin and related  Review of Systems   Review of Systems  Constitutional: Positive for fatigue and fever.  Respiratory: Positive for cough and shortness of breath.   All other systems reviewed and are negative.   Physical Exam Updated Vital Signs BP (!) 162/71   Pulse (!) 49   Temp (!) 100.7 F (38.2 C) (Oral)   Resp 19   Ht 5\' 11"  (1.803 m)   Wt 95.7 kg   SpO2 99%   BMI 29.43 kg/m   Physical Exam Vitals and nursing note reviewed.  Constitutional:      General: He is not in acute distress.    Appearance: Normal appearance. He is well-developed and well-nourished.  HENT:     Head: Normocephalic. Contusion present.     Right Ear: Hearing normal.     Left Ear: Hearing normal.     Nose: Nose normal.     Mouth/Throat:     Mouth: Oropharynx is clear and moist and mucous membranes are normal.  Eyes:     Extraocular Movements: EOM normal.     Conjunctiva/sclera: Conjunctivae normal.     Pupils: Pupils are equal, round, and reactive to light.  Cardiovascular:     Rate and Rhythm:  Regular rhythm.     Heart sounds: S1 normal and S2 normal. No murmur heard. No friction rub. No gallop.   Pulmonary:     Effort: Pulmonary effort is normal. No respiratory distress.     Breath sounds: Normal breath sounds.  Chest:     Chest wall:  No tenderness.  Abdominal:     General: Bowel sounds are normal.     Palpations: Abdomen is soft. There is no hepatosplenomegaly.     Tenderness: There is no abdominal tenderness. There is no guarding or rebound. Negative signs include Murphy's sign and McBurney's sign.     Hernia: No hernia is present.  Musculoskeletal:        General: Normal range of motion.     Right hand: Tenderness present. No swelling, deformity or lacerations. Normal range of motion.     Cervical back: Normal range of motion and neck supple.  Skin:    General: Skin is warm, dry and intact.     Findings: No rash.     Nails: There is no cyanosis.  Neurological:     Mental Status: He is alert and oriented to person, place, and time.     GCS: GCS eye subscore is 4. GCS verbal subscore is 5. GCS motor subscore is 6.     Cranial Nerves: No cranial nerve deficit.     Sensory: No sensory deficit.     Coordination: Coordination normal.     Deep Tendon Reflexes: Strength normal.  Psychiatric:        Mood and Affect: Mood and affect normal.        Speech: Speech normal.        Behavior: Behavior normal.        Thought Content: Thought content normal.     ED Results / Procedures / Treatments   Labs (all labs ordered are listed, but only abnormal results are displayed) Labs Reviewed  RESP PANEL BY RT-PCR (FLU A&B, COVID) ARPGX2 - Abnormal; Notable for the following components:      Result Value   SARS Coronavirus 2 by RT PCR POSITIVE (*)    All other components within normal limits  COMPREHENSIVE METABOLIC PANEL - Abnormal; Notable for the following components:   Sodium 134 (*)    Chloride 97 (*)    Glucose, Bld 100 (*)    Creatinine, Ser 1.38 (*)    GFR, Estimated  52 (*)    All other components within normal limits  CBC - Abnormal; Notable for the following components:   MCV 100.4 (*)    All other components within normal limits  PROTIME-INR - Abnormal; Notable for the following components:   Prothrombin Time 15.6 (*)    INR 1.3 (*)    All other components within normal limits  I-STAT CHEM 8, ED - Abnormal; Notable for the following components:   Creatinine, Ser 1.30 (*)    Calcium, Ion 1.13 (*)    All other components within normal limits  ETHANOL  URINALYSIS, ROUTINE W REFLEX MICROSCOPIC  LACTIC ACID, PLASMA  I-STAT BETA HCG BLOOD, ED (MC, [facility], AP ONLY)  SAMPLE TO BLOOD BANK    EKG EKG Interpretation  Date/Time:  Friday May 21 2020 04:59:33 EST Ventricular Rate:  58 PR Interval:  216 QRS Duration: 104 QT Interval:  508 QTC Calculation: 498 R Axis:   -58 Text Interpretation: Sinus bradycardia with 1st degree A-V block Left axis deviation Prolonged QT Abnormal ECG Confirmed by Orpah Greek [id number]) on 05/21/2020 6:54:33 AM   Radiology DG Pelvis Portable  Result Date: 05/21/2020 CLINICAL DATA:  79 year old male diagnosed with COVID-19 yesterday. Fall. EXAM: PORTABLE PELVIS 1-2 VIEWS COMPARISON:  CT Abdomen and Pelvis 07/28/2016. FINDINGS: Portable AP supine view at 0545 hours. Femoral heads are normally located. No pelvis fracture identified. SI joints  appear stable and within normal limits. Grossly intact proximal femurs, the entire left greater trochanter is not included. Negative visible bowel gas pattern. IMPRESSION: No acute fracture or dislocation identified about the pelvis. Electronically Signed   By: Genevie Ann M.D.   On: 05/21/2020 06:04   DG Chest Port 1 View  Result Date: 05/21/2020 CLINICAL DATA:  79 year old male diagnosed with COVID-19 yesterday. Fall. Former smoker. EXAM: PORTABLE CHEST 1 VIEW COMPARISON:  Portable chest 08/10/2017 and earlier. FINDINGS: Portable AP semi upright view at 0553 hours. Lower lung  volumes. Stable mediastinal contours, within normal limits. Visualized tracheal air column is within normal limits. Crowding of bilateral lung markings with underlying chronic increased interstitial opacity. No pneumothorax, pleural effusion or consolidation. Negative visible bowel gas pattern. Chronic left lateral rib fractures. No acute osseous abnormality identified. IMPRESSION: Low lung volumes with atelectasis. No other acute cardiopulmonary abnormality identified. Electronically Signed   By: Genevie Ann M.D.   On: 05/21/2020 06:08   DG Hand Complete Right  Result Date: 05/21/2020 CLINICAL DATA:  79 year old male diagnosed with COVID-19 yesterday. Fall. EXAM: RIGHT HAND - COMPLETE 3+ VIEW COMPARISON:  None. FINDINGS: Pulse oximeter artifact on the index finger. Punctate retained metallic foreign bodies along the dorsum of the 4th and 5th fingers. Bone mineralization is within normal limits. Distal radius and ulna intact; possible remote ulnar styloid fracture. Carpal bone alignment and joint spaces within normal limits. Metacarpals appear intact. Phalanges appear intact with some distal IP osteoarthritis. No discrete soft tissue injury. IMPRESSION: 1. No acute fracture or dislocation identified about the right hand. 2. Punctate retained metallic foreign bodies along the 4th and 5th fingers. Electronically Signed   By: Genevie Ann M.D.   On: 05/21/2020 06:07    Procedures Procedures (including critical care time)  Medications Ordered in ED Medications  acetaminophen (TYLENOL) tablet 650 mg (has no administration in time range)    ED Course  I have reviewed the triage vital signs and the nursing notes.  Pertinent labs & imaging results that were available during my care of the patient were reviewed by me and considered in my medical decision making (see chart for details).    MDM Rules/Calculators/A&P                          Patient presents to the emergency department for evaluation of  generalized weakness causing a fall.  Patient is on Eliquis.  He hit his head but is awake and alert, at his normal baseline.  CT head to rule out intracranial bleed.  Patient reports that he is experiencing severe generalized weakness.  He is Covid positive.  Patient mildly hypoxic at arrival.  He is requiring supplemental oxygen.  Chest x-ray without acute findings.  Final Clinical Impression(s) / ED Diagnoses Final diagnoses:  Trauma  COVID-19  Hypoxia    Rx / DC Orders ED Discharge Orders    None       Shequila Neglia, Gwenyth Allegra, MD 05/21/20 [id number]    Orpah Greek, MD 05/21/20 [phone]

## 2020-05-21 NOTE — Discharge Instructions (Signed)
It was our pleasure to provide your ER care today - we hope that you feel better.  Fall precautions.   See covid information/instructions.   Make sure to drink plenty of fluids, and get adequate nutrition.  Return to ER if worse, new symptoms, increased trouble breathing, new/severe pain, or other concern.

## 2020-05-21 NOTE — ED Notes (Signed)
Pt found on the side of the bed standing. Monitors off, pt took off all his clothes. Pt said he needed "to go". Urinal provided. Pt able to urinated.

## 2020-05-21 NOTE — ED Notes (Addendum)
Pt ambulated holding on to the bed, per pt he uses cane at home. Pt able to follow some commands.

## 2020-05-21 NOTE — ED Triage Notes (Signed)
Pt comes from home via [facility], diagnosed with covid yesterday, witnessed fall by wife, on Eliqiuis, no c/o of pain, generalized weakness all over, pt states he hit his head, AxO x 3 per baseline. Also having R hand pain

## 2020-05-21 NOTE — ED Provider Notes (Addendum)
Signed out by Dr Blinda Leatherwood at 0730 to check cts when resulted and reassess pt.   CTs neg for acute trauma. C spine non tender. C collar removed.   Patient with recent dx covid. Pt indicates has been fully vaccinated. Patients room air pulse ox (when correlating/good waveform) is consistently in 94-97% range. Pt has no increased wob, and chest xray without significant infiltrate.   Mab ordered - pharmacy indicates as of today, no longer using or available for ED/inpatient use.   Pt is breathing comfortably, is not hypoxic, and currently appears stable for d/c.  COVID instructions/precautions provided. Fall precautions.       Cathren Laine, MD 05/21/20 [phone]

## 2020-06-01 ENCOUNTER — Ambulatory Visit: Payer: Medicare Other | Admitting: Internal Medicine

## 2020-06-12 ENCOUNTER — Other Ambulatory Visit: Payer: Self-pay

## 2020-06-12 ENCOUNTER — Emergency Department (HOSPITAL_COMMUNITY): Payer: Medicare Other

## 2020-06-12 ENCOUNTER — Inpatient Hospital Stay (HOSPITAL_COMMUNITY)
Admission: EM | Admit: 2020-06-12 | Discharge: 2020-06-15 | DRG: 683 | Disposition: A | Discharge status: Home / Self Care | Payer: Medicare Other | Attending: Family Medicine | Admitting: Family Medicine

## 2020-06-12 DIAGNOSIS — N139 Obstructive and reflux uropathy, unspecified: Secondary | ICD-10-CM

## 2020-06-12 DIAGNOSIS — I2510 Atherosclerotic heart disease of native coronary artery without angina pectoris: Secondary | ICD-10-CM | POA: Diagnosis not present

## 2020-06-12 DIAGNOSIS — Z79899 Other long term (current) drug therapy: Secondary | ICD-10-CM | POA: Diagnosis not present

## 2020-06-12 DIAGNOSIS — I480 Paroxysmal atrial fibrillation: Secondary | ICD-10-CM | POA: Diagnosis not present

## 2020-06-12 DIAGNOSIS — I440 Atrioventricular block, first degree: Secondary | ICD-10-CM | POA: Diagnosis present

## 2020-06-12 DIAGNOSIS — E785 Hyperlipidemia, unspecified: Secondary | ICD-10-CM | POA: Diagnosis present

## 2020-06-12 DIAGNOSIS — R338 Other retention of urine: Secondary | ICD-10-CM | POA: Diagnosis not present

## 2020-06-12 DIAGNOSIS — J449 Chronic obstructive pulmonary disease, unspecified: Secondary | ICD-10-CM | POA: Diagnosis not present

## 2020-06-12 DIAGNOSIS — R451 Restlessness and agitation: Secondary | ICD-10-CM | POA: Diagnosis not present

## 2020-06-12 DIAGNOSIS — Z743 Need for continuous supervision: Secondary | ICD-10-CM | POA: Diagnosis not present

## 2020-06-12 DIAGNOSIS — R339 Retention of urine, unspecified: Secondary | ICD-10-CM

## 2020-06-12 DIAGNOSIS — Z8616 Personal history of COVID-19: Secondary | ICD-10-CM | POA: Diagnosis not present

## 2020-06-12 DIAGNOSIS — I5032 Chronic diastolic (congestive) heart failure: Secondary | ICD-10-CM | POA: Diagnosis not present

## 2020-06-12 DIAGNOSIS — Z888 Allergy status to other drugs, medicaments and biological substances status: Secondary | ICD-10-CM

## 2020-06-12 DIAGNOSIS — R0902 Hypoxemia: Secondary | ICD-10-CM | POA: Diagnosis not present

## 2020-06-12 DIAGNOSIS — Z8249 Family history of ischemic heart disease and other diseases of the circulatory system: Secondary | ICD-10-CM

## 2020-06-12 DIAGNOSIS — R001 Bradycardia, unspecified: Secondary | ICD-10-CM

## 2020-06-12 DIAGNOSIS — Z87891 Personal history of nicotine dependence: Secondary | ICD-10-CM | POA: Diagnosis not present

## 2020-06-12 DIAGNOSIS — Z91048 Other nonmedicinal substance allergy status: Secondary | ICD-10-CM | POA: Diagnosis not present

## 2020-06-12 DIAGNOSIS — R109 Unspecified abdominal pain: Secondary | ICD-10-CM | POA: Diagnosis not present

## 2020-06-12 DIAGNOSIS — Z7951 Long term (current) use of inhaled steroids: Secondary | ICD-10-CM

## 2020-06-12 DIAGNOSIS — Z8673 Personal history of transient ischemic attack (TIA), and cerebral infarction without residual deficits: Secondary | ICD-10-CM

## 2020-06-12 DIAGNOSIS — I110 Hypertensive heart disease with heart failure: Secondary | ICD-10-CM | POA: Diagnosis not present

## 2020-06-12 DIAGNOSIS — Z7901 Long term (current) use of anticoagulants: Secondary | ICD-10-CM

## 2020-06-12 DIAGNOSIS — N179 Acute kidney failure, unspecified: Principal | ICD-10-CM | POA: Diagnosis present

## 2020-06-12 DIAGNOSIS — Z20822 Contact with and (suspected) exposure to covid-19: Secondary | ICD-10-CM | POA: Diagnosis not present

## 2020-06-12 DIAGNOSIS — Z823 Family history of stroke: Secondary | ICD-10-CM

## 2020-06-12 DIAGNOSIS — Z85828 Personal history of other malignant neoplasm of skin: Secondary | ICD-10-CM | POA: Diagnosis not present

## 2020-06-12 DIAGNOSIS — F0391 Unspecified dementia with behavioral disturbance: Secondary | ICD-10-CM | POA: Diagnosis present

## 2020-06-12 DIAGNOSIS — K5900 Constipation, unspecified: Secondary | ICD-10-CM | POA: Diagnosis not present

## 2020-06-12 DIAGNOSIS — K449 Diaphragmatic hernia without obstruction or gangrene: Secondary | ICD-10-CM | POA: Diagnosis not present

## 2020-06-12 DIAGNOSIS — N3289 Other specified disorders of bladder: Secondary | ICD-10-CM | POA: Diagnosis not present

## 2020-06-12 DIAGNOSIS — N1330 Unspecified hydronephrosis: Secondary | ICD-10-CM | POA: Diagnosis present

## 2020-06-12 DIAGNOSIS — N401 Enlarged prostate with lower urinary tract symptoms: Secondary | ICD-10-CM | POA: Diagnosis present

## 2020-06-12 DIAGNOSIS — I4891 Unspecified atrial fibrillation: Secondary | ICD-10-CM | POA: Diagnosis not present

## 2020-06-12 LAB — COMPREHENSIVE METABOLIC PANEL
ALT: 15 U/L (ref 0–44)
AST: 14 U/L — ABNORMAL LOW (ref 15–41)
Albumin: 3.1 g/dL — ABNORMAL LOW (ref 3.5–5.0)
Alkaline Phosphatase: 68 U/L (ref 38–126)
Anion gap: 14 (ref 5–15)
BUN: 74 mg/dL — ABNORMAL HIGH (ref 8–23)
CO2: 21 mmol/L — ABNORMAL LOW (ref 22–32)
Calcium: 8.8 mg/dL — ABNORMAL LOW (ref 8.9–10.3)
Chloride: 98 mmol/L (ref 98–111)
Creatinine, Ser: 5.81 mg/dL — ABNORMAL HIGH (ref 0.61–1.24)
GFR, Estimated: 9 mL/min — ABNORMAL LOW (ref >60)
Glucose, Bld: 104 mg/dL — ABNORMAL HIGH (ref 70–99)
Potassium: 4.5 mmol/L (ref 3.5–5.1)
Sodium: 133 mmol/L — ABNORMAL LOW (ref 135–145)
Total Bilirubin: 0.8 mg/dL (ref 0.3–1.2)
Total Protein: 6.9 g/dL (ref 6.5–8.1)

## 2020-06-12 LAB — CBC WITH DIFFERENTIAL/PLATELET
Abs Immature Granulocytes: 0.07 10*3/uL (ref 0.00–0.07)
Basophils Absolute: 0 10*3/uL (ref 0.0–0.1)
Basophils Relative: 0 %
Eosinophils Absolute: 0.2 10*3/uL (ref 0.0–0.5)
Eosinophils Relative: 2 %
HCT: 40.7 % (ref 39.0–52.0)
Hemoglobin: 13.5 g/dL (ref 13.0–17.0)
Immature Granulocytes: 1 %
Lymphocytes Relative: 8 %
Lymphs Abs: 1 10*3/uL (ref 0.7–4.0)
MCH: 32.2 pg (ref 26.0–34.0)
MCHC: 33.2 g/dL (ref 30.0–36.0)
MCV: 97.1 fL (ref 80.0–100.0)
Monocytes Absolute: 1.2 10*3/uL — ABNORMAL HIGH (ref 0.1–1.0)
Monocytes Relative: 9 %
Neutro Abs: 10.7 10*3/uL — ABNORMAL HIGH (ref 1.7–7.7)
Neutrophils Relative %: 80 %
Platelets: 377 10*3/uL (ref 150–400)
RBC: 4.19 MIL/uL — ABNORMAL LOW (ref 4.22–5.81)
RDW: 12 % (ref 11.5–15.5)
WBC: 13.3 10*3/uL — ABNORMAL HIGH (ref 4.0–10.5)
nRBC: 0 % (ref 0.0–0.2)

## 2020-06-12 LAB — URINALYSIS, ROUTINE W REFLEX MICROSCOPIC
Bilirubin Urine: NEGATIVE
Glucose, UA: NEGATIVE mg/dL
Ketones, ur: NEGATIVE mg/dL
Nitrite: NEGATIVE
Protein, ur: NEGATIVE mg/dL
Specific Gravity, Urine: 1.014 (ref 1.005–1.030)
pH: 5 (ref 5.0–8.0)

## 2020-06-12 LAB — MAGNESIUM: Magnesium: 2.1 mg/dL (ref 1.7–2.4)

## 2020-06-12 LAB — LIPASE, BLOOD: Lipase: 34 U/L (ref 11–51)

## 2020-06-12 LAB — LACTIC ACID, PLASMA
Lactic Acid, Venous: 1.4 mmol/L (ref 0.5–1.9)
Lactic Acid, Venous: 1.5 mmol/L (ref 0.5–1.9)

## 2020-06-12 LAB — TROPONIN I (HIGH SENSITIVITY)
Troponin I (High Sensitivity): 9 ng/L (ref <18)
Troponin I (High Sensitivity): 10 ng/L (ref <18)

## 2020-06-12 MED ORDER — FENTANYL CITRATE (PF) 100 MCG/2ML IJ SOLN
50.0000 ug | Freq: Once | INTRAMUSCULAR | Status: AC
  Administered 2020-06-12: 50 ug via INTRAVENOUS
  Filled 2020-06-12: qty 2

## 2020-06-12 MED ORDER — ACETAMINOPHEN 325 MG PO TABS
650.0000 mg | ORAL_TABLET | Freq: Four times a day (QID) | ORAL | Status: DC | PRN
  Administered 2020-06-13 – 2020-06-14 (×2): 650 mg via ORAL
  Filled 2020-06-12 (×2): qty 2

## 2020-06-12 MED ORDER — MEMANTINE HCL 5 MG PO TABS
5.0000 mg | ORAL_TABLET | Freq: Two times a day (BID) | ORAL | Status: DC
  Filled 2020-06-12 (×2): qty 1

## 2020-06-12 MED ORDER — DOCUSATE SODIUM 100 MG PO CAPS: 100.0000 mg | ORAL_CAPSULE | Freq: Every day | ORAL | Status: DC | PRN

## 2020-06-12 MED ORDER — SODIUM CHLORIDE 0.9 % IV BOLUS
1000.0000 mL | Freq: Once | INTRAVENOUS | Status: AC
  Administered 2020-06-12: 1000 mL via INTRAVENOUS

## 2020-06-12 MED ORDER — POLYETHYLENE GLYCOL 3350 17 G PO PACK: 17.0000 g | PACK | Freq: Every day | ORAL | Status: DC | PRN

## 2020-06-12 MED ORDER — FINASTERIDE 5 MG PO TABS
5.0000 mg | ORAL_TABLET | Freq: Every day | ORAL | Status: DC
  Administered 2020-06-12 – 2020-06-14 (×3): 5 mg via ORAL
  Filled 2020-06-12 (×4): qty 1

## 2020-06-12 MED ORDER — MORPHINE SULFATE (PF) 2 MG/ML IV SOLN
2.0000 mg | Freq: Once | INTRAVENOUS | Status: AC
  Administered 2020-06-12: 2 mg via INTRAVENOUS
  Filled 2020-06-12: qty 1

## 2020-06-12 MED ORDER — LORAZEPAM 2 MG/ML IJ SOLN
1.0000 mg | Freq: Once | INTRAMUSCULAR | Status: AC
  Administered 2020-06-12: 1 mg via INTRAVENOUS
  Filled 2020-06-12: qty 1

## 2020-06-12 MED ORDER — SODIUM CHLORIDE 0.9 % IV SOLN: INTRAVENOUS | Status: DC

## 2020-06-12 MED ORDER — UMECLIDINIUM BROMIDE 62.5 MCG/INH IN AEPB
1.0000 | INHALATION_SPRAY | Freq: Every day | RESPIRATORY_TRACT | Status: DC
  Administered 2020-06-13 – 2020-06-15 (×3): 1 via RESPIRATORY_TRACT
  Filled 2020-06-12: qty 7

## 2020-06-12 MED ORDER — DONEPEZIL HCL 10 MG PO TABS: 10.0000 mg | ORAL_TABLET | Freq: Every day | ORAL | Status: DC

## 2020-06-12 MED ORDER — NITROGLYCERIN 0.4 MG SL SUBL: 0.4000 mg | SUBLINGUAL_TABLET | SUBLINGUAL | Status: DC | PRN

## 2020-06-12 MED ORDER — APIXABAN 5 MG PO TABS
5.0000 mg | ORAL_TABLET | Freq: Two times a day (BID) | ORAL | Status: DC
  Administered 2020-06-12 – 2020-06-15 (×6): 5 mg via ORAL
  Filled 2020-06-12 (×6): qty 1

## 2020-06-12 MED ORDER — ACETAMINOPHEN 650 MG RE SUPP: 650.0000 mg | Freq: Four times a day (QID) | RECTAL | Status: DC | PRN

## 2020-06-12 MED ORDER — FLECAINIDE ACETATE 50 MG PO TABS
150.0000 mg | ORAL_TABLET | Freq: Two times a day (BID) | ORAL | Status: DC
  Administered 2020-06-12 – 2020-06-15 (×6): 150 mg via ORAL
  Filled 2020-06-12 (×8): qty 1

## 2020-06-12 NOTE — ED Notes (Signed)
When pt is more alert, MD would like a swallow screen for p.o. meds

## 2020-06-12 NOTE — H&P (Addendum)
[facility] Admission History and Physical Service Pager: [phone]  Patient name: Erik [name] Medical record number: [id number] Date of birth: December 24, 1940 Age: 80 y.o. Gender: male  Primary Care Provider: Alroy Dust, L.Marlou Sa, MD Consultants: None Code Status: Full code Preferred Emergency Contact: Erik [name] [phone]  Chief Complaint: Abdominal pain  Assessment and Plan: Erik [name] is a 80 y.o. male presenting with abdominal pain and found to have urinary retention and bilateral hydronephrosis. PMH is significant for BPH, paroxysmal A. fib, CAD, hypertension, dementia, history of CVA.  Urinary retention with bilateral hydronephrosis and AKI Patient presents after 2 to 3 days of reduced urine output.  Patient states that he had had significant diarrhea since his COVID-19 infection about a month ago and went to his primary care doctor about a week ago and was prescribed hyoscyamine.  His wife states he use this for his diarrhea and within a few days of using the medication she noticed reduced urine output.  This medication does have decreased urinary retention as an adverse effect.  As the patient's symptoms started immediately after starting this medication is most likely the cause for his symptoms, however he does have a history of urinary retention and is on finasteride for this.  A Foley was placed in the emergency department and 1.7 L of urine was removed.  CT abdomen pelvis showed mild bilateral hydronephrosis without a focal discrete obstruction stone with markedly distended bladder and no other acute abnormalities.  Patient presented with an impressive AKI with a baseline of 1-1.3 and a creatinine of 5.81 which is likely secondary to the urinary retention and mild bilateral hydronephrosis. -Admit to [facility], attending Dr. Gwendlyn Deutscher -Strict I's and O's -A.m. BMP -We will maintain Foley at this time and likely perform a void trial on 1/23 -Avoid  nephrotoxic agents -Maintenance IV fluids -PT/OT  AKI Per chart review patient's baseline appears to be around 1-1.3 with creatinine, on presentation the patient's creatinine was greatly elevated at 5.81.  This is likely due to obstruction as patient put out greater than 1.5 L after the insertion of a Foley catheter. -Avoid nephrotoxic agents -A.m. BMP -Continue to monitor urinary output with strict I's and O's -Maintain Foley 1/22 -Consider a voiding trial 1/23  Recent COVID infection Patient was confirmed positive COVID-19 on 05/21/2021.  Patient states that he developed significant diarrhea after this but per his wife it has been improving. -Continue to monitor -No indications for contact precautions at this time  Paroxysmal A. fib Patient currently with regular rhythm, heart rate in the mid to upper 50s.  Home medications include Eliquis 5 mg twice daily.  Patient is also on flecainide 150 mg twice daily. -Continue home Eliquis and flecainide  Hypertension Current blood pressure since admission normotensive with ranged from 119-140/70-78.  Home medications include lisinopril 20 mg/day. -Hold home lisinopril in the setting of AKI  HFpEF Last echo in February 2019 with ejection fraction 55-60% and grade 2 diastolic dysfunction.  Dementia Per patient's balance at baseline he is normally oriented to himself and sometimes to location if it is a stating familiar to him.  He is normally not oriented to year.  On admission today he is only oriented to self.  Home medication include Aricept 10 mg at bedtime as well as Namenda -Hold home Aricept and Namenda  QTC prolongation EKG shows QTC of 501. -Avoid QT prolonging agents -Continue to monitor  History of TIA: Per chart review this was June 2012 immediately  after his ablation for atrial fibrillation.  -Continue to monitor  COPD Per chart review patient has a history of COPD.  Home medications include Incruse Ellipta. -Continue  Incruse Ellipta  FEN/GI: Heart healthy Prophylaxis: Eliquis  Disposition: Admit to med-surg  History of Present Illness:  Erik [name] is a 80 y.o. male presenting with urinary retention and bilateral hydronephrosis. Patient presents today for abdominal pain and diarrhea for 1 week.  He states he has been constipated due to the COVID over the past 1 month.  Patient presents with a AKI and urinary retention on CT scan.  A Foley was placed in the emergency department 1.7 L of urine was removed.  He was taken hyoscyamine earlier from his PCP for his diarrhea which may have predisposed him to increased urinary retention.  His symptoms started shortly thereafter taking the medication.  Per the patient's wife: Patient had diarrhea all last week and took medication on Saturday for it and last night developed some worsening abdominal pain. After taking the medication the patient seemed more confused and having some hallucinations per his wife. Last bowel movement was this morning. His wife noticed his decreased urine output after starting the medication.   Smoking: Stopped smoking 30-40 years ago Alcohol: No alcohol since before christmas  Drugs: none    Review Of Systems: Per HPI with the following additions:   Review of Systems  Constitutional: Negative for fever.  HENT: Negative for congestion.   Respiratory: Negative for cough and shortness of breath.   Cardiovascular: Negative for chest pain.  Gastrointestinal: Positive for abdominal pain and diarrhea. Negative for vomiting.  Genitourinary: Positive for difficulty urinating.     Patient Active Problem List   Diagnosis Date Noted  . Atrial fibrillation with RVR ([place]) 08/10/2017  . Chest pain 08/10/2017  . ETOH abuse 02/16/2015  . Dyspnea 09/01/2014  . Snoring 09/01/2014  . Periodic limb movement disorder (PLMD) 09/01/2014  . 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 TIA (transient ischemic attack) 11/18/2010  . CAD (coronary  artery disease) 10/05/2010  . Essential hypertension 02/03/2009  . PAF (paroxysmal atrial fibrillation) ([place]) 02/03/2009  . Atrial flutter ([place]) 02/03/2009    Past Medical History: Past Medical History:  Diagnosis Date  . Arthritis    both knees  . Atrial fibrillation ([facility])    Paroxysmal, s/p PVI 11/11/10  . Atrial flutter ([place])    s/p RFCA  by GT  . Basal cell carcinoma of ear   . CAD (coronary artery disease)    a. cath 5/12: pLAD 25%, D1 25%, EF 65 % (done 2/2 abnormal myoview with inferapical  ischemia)  . Dyslipidemia   . Dysrhythmia   . HTN (hypertension)   . Hx of echocardiogram    Echo (9/15):  Mild LVH, EF 55-60%, no RWMA, normal diast function, mild MR, mod LAE, mildly increased pulmonary pressures (PASP 30 mmHg).  . Hypertension   . Shortness of breath dyspnea    has a low heart rate  . Snoring    a. sleep study 11/2012 neg for OSA; increased leg movement suspicious for primary movement d/o (ie restless leg syndrome)   . Stroke [facility])    mild stroke with  second heart ablation  . TIA (transient ischemic attack)    11/12/10 post afib ablation    Past Surgical History: Past Surgical History:  Procedure Laterality Date  . atrial flutter ablation     by GT  . basal cell cancer resection    . CARDIOVERSION  10/31/2011   Procedure: CARDIOVERSION;  Surgeon: Jolaine Artist, MD;  Location: [facility] ENDOSCOPY;  Service: Cardiovascular;  Laterality: N/A;  . CATARACT EXTRACTION, BILATERAL    . COLONOSCOPY    . EYE SURGERY     growth under lid- x2  . HEMORRHOID SURGERY    . KNEE ARTHROSCOPY Bilateral    bilateral  . SHOULDER ARTHROSCOPY Right 08/04/2015   Procedure: ARTHROSCOPY RIGHT SHOULDER;  Surgeon: Frederik Pear, MD;  Location: [place];  Service: Orthopedics;  Laterality: Right;  . TEE WITHOUT CARDIOVERSION  10/31/2011   Procedure: TRANSESOPHAGEAL ECHOCARDIOGRAM (TEE);  Surgeon: Jolaine Artist, MD;  Location: [facility] ENDOSCOPY;  Service: Cardiovascular;  Laterality: N/A;     Social History: Social History   Tobacco Use  . Smoking status: Former Smoker    Packs/day: 1.50    Years: 20.00    Pack years: 30.00    Types: Cigarettes    Quit date: 05/23/1975    Years since quitting: 45.0  . Smokeless tobacco: Never Used  Vaping Use  . Vaping Use: Never used  Substance Use Topics  . Alcohol use: No    Alcohol/week: 14.0 standard drinks    Types: 14 Standard drinks or equivalent per week    Comment: has not had a drink in 9 months  . Drug use: No   Additional social history: None relevant Please also refer to relevant sections of EMR.  Family History: Family History  Problem Relation Age of Onset  . Hypertension Mother   . Stroke Mother   . Heart disease Mother   . Arthritis Mother   . Heart attack Neg Hx     Allergies and Medications: Allergies  Allergen Reactions  . Albuterol Sulfate [Albuterol] Other (See Comments)    rapid heartbeat; afib  . Adhesive [Tape] Rash and Other (See Comments)    New heart monitor/patch (adhesive) broke out the skin and left a large red patch (still visible after 3 weeks  . Warfarin And Related Other (See Comments)    Patient's INR could never get regulated while on this   Current Facility-Administered Medications on File Prior to Encounter  Medication Dose Route Frequency Provider Last Rate Last Admin  . gadobenate dimeglumine (MULTIHANCE) injection 20 mL  20 mL Intravenous Once Radiologist, Medication, MD       Current Outpatient Medications on File Prior to Encounter  Medication Sig Dispense Refill  . acetaminophen (TYLENOL) 500 MG tablet Take 500-1,000 mg by mouth every 6 (six) hours as needed (pain).    Marland Kitchen docusate sodium (COLACE) 100 MG capsule Take 100 mg by mouth daily as needed for mild constipation.    Marland Kitchen donepezil (ARICEPT) 10 MG tablet Take 10 mg by mouth at bedtime.  0  . ELIQUIS 5 MG TABS tablet TAKE 1 TABLET BY MOUTH TWICE A DAY (Patient taking differently: Take 5 mg by mouth 2 (two) times  daily.) 60 tablet 11  . FIBER PO Take 1 capsule by mouth daily.    . finasteride (PROSCAR) 5 MG tablet Take 5 mg by mouth at bedtime.   12  . flecainide (TAMBOCOR) 150 MG tablet TAKE 1 TABLET(150 MG) BY MOUTH TWICE DAILY (Patient taking differently: Take 150 mg by mouth 2 (two) times daily.) 180 tablet 3  . INCRUSE ELLIPTA 62.5 MCG/INH AEPB Take 1 puff by mouth daily.  1  . Ketotifen Fumarate (ZADITOR OP) Place 2 drops into both eyes as needed (for itching).    Marland Kitchen lisinopril (ZESTRIL) 20 MG tablet TAKE  1/2 TABLET BY MOUTH TWICE DAILY (Patient taking differently: Take 10 mg by mouth 2 (two) times daily.) 90 tablet 3  . memantine (NAMENDA) 5 MG tablet Take 5 mg by mouth 2 (two) times daily.    . nitroGLYCERIN (NITROSTAT) 0.4 MG SL tablet Place 1 tablet (0.4 mg total) under the tongue every 5 (five) minutes as needed for chest pain (MAX 3 TABLETS). 25 tablet 11    Objective: BP 140/73   Pulse (!) 59   Temp 98.9 F (37.2 C)   Resp 17   Ht 5\' 11"  (1.803 m)   Wt 96.2 kg   SpO2 98%   BMI 29.57 kg/m  Exam: General: Alert, elderly individual oriented to person but not place or year.  No acute distress. Eyes: PERRLA Neck: nontender Cardiovascular: RRR with no murmurs noted Respiratory: CTA bilaterally  Gastrointestinal: Bowel sounds present. No abdominal pain Derm: No rashes noted Neuro: Alert, only oriented to person which his wife says is baseline, pupils equal round reactive to light, patient will not cooperate with extraocular movement but does not appear to have any deficiencies, patient failed to cooperate with remainder of neurologic exam.  Patient does have a baseline tremor present primarily in the left arm which the wife says is chronic.  Labs and Imaging: CBC BMET  Recent Labs  Lab 06/12/20 0908  WBC 13.3*  HGB 13.5  HCT 40.7  PLT 377   Recent Labs  Lab 06/12/20 0908  NA 133*  K 4.5  CL 98  CO2 21*  BUN 74*  CREATININE 5.81*  GLUCOSE 104*  CALCIUM 8.8Lurline Del, DO 06/12/2020, 11:44 AM PGY-2, [place] Intern pager: [phone], text pages welcome

## 2020-06-12 NOTE — ED Notes (Signed)
Pt was stuck 5 times for an IV. The IV blew 5 times. RN unable to get blood cultures. EMT, RN, and phlebotomy attempted

## 2020-06-12 NOTE — ED Notes (Signed)
RN called lab and stated they will add magnesium

## 2020-06-12 NOTE — Progress Notes (Addendum)
FPTS Interim Progress Note  S: Went to bedside to evaluate patient for bradycardia.  Heart rate has been in the 40s, occasionally dipping into the 30s.  Wife states that his heart rate is usually in the 50s, never dips in the 40s or 30s.  He received 1 mg of IV lorazepam about 2-1/2 hours ago for agitation.  Since then, patient has been sleeping and unable to be aroused by wife or nurse.  Wife requesting for a cardiology evaluation.  He sees Dr. Lovena Le outpatient for his A. fib.  O: BP (!) 149/65   Pulse (!) 51   Temp 98.9 F (37.2 C)   Resp 18   Ht 5\' 11"  (1.803 m)   Wt 96.2 kg   SpO2 100%   BMI 29.57 kg/m   General: somnolent with loud snores, unable to be aroused CV: bradycardic, regular rhythm Pulm: CTAB, good air movement, maintaining adequate SpO2 on room air  EKG reviewed, sinus bradycardia with prolonged PR interval consistent with 1st degree heart block, HR 41  A/P: Sinus bradycardia Patient with sinus bradycardia with first-degree AV block which is not a new issue for him.  Suspect this is exacerbated by benzodiazepine administration and will likely resolve when medication wears off. - avoid benzodiazepines - will consult cardiology for further recommendations  Zola Button, MD 06/12/2020, 5:52 PM PGY-1, [facility] Service pager [phone]   -- Update: Spoke with Dr. Radford Pax with [facility] cardiology regarding bradycardia.  It is normal for heart rate to go into the 40s and 30s when sleeping. No further recommendations at this time.  Went to update wife regarding conversation with cardiologist and provided reassurance.  Wife appreciative of update.  While in the room, patient heart rate in the 50s on the monitor and appears to be waking up.  Eyes are now open though still quite somnolent and not responding to questions.  We will continue to monitor.  Consider formal consult if bradycardic in the 30s or 40s while awake.

## 2020-06-12 NOTE — ED Notes (Signed)
Patient transported to CT 

## 2020-06-12 NOTE — ED Triage Notes (Signed)
PT BIB [facility] after wife called to report that pt was having increase abd. Pain x 1 week after pt took laxative, suppository, and 2 fiber gummies. PT was prescribed hyoscamine by PCP which made pt combative and hallucinate. Pt abd pain returned on after pt stop taking medication. Wife reports pt stool being more formed but still loose since last week. Pt was positive for Covid 30th Dec. PT hx of stoke, dementia, HTN , afib.

## 2020-06-12 NOTE — Progress Notes (Addendum)
Family Medicine Teaching Service Daily Progress Note Intern Pager: [phone]  Patient name: Erik [name] Medical record number: [id number] Date of birth: [birth year] Age: 80 y.o. Gender: male  Primary Care Provider: Alroy Dust, L.Marlou Sa, MD Consultants: None  Code Status: Full  Pt Overview and Major Events to Date:  1/22 Admitted  Assessment and Plan: Erik [name] is a 80 y.o. male presenting with abdominal pain and found to have urinary retention and bilateral hydronephrosis. PMH is significant for BPH, paroxysmal A. fib, CAD, hypertension, dementia, history of CVA.   Urinary retention with bilateral hydronephrosis and AKI Patient presents after 2 to 3 days of reduced urine output. S/p significant diarrhea since his COVID-19 infection about a month ago; prescribed hyoscyamine. Foley was placed in the emergency department and 1.7 L of urine was removed. So far total output is 3.5L.  CT abdomen pelvis showed mild bilateral hydronephrosis without a focal discrete obstruction stone with markedly distended bladder and no other acute abnormalities. Now complains of suprapubic tenderness with palpation  Patient presented with an impressive AKI with a baseline of 1-1.3 and a creatinine of 5.81 which is likely secondary to the urinary retention and mild bilateral hydronephrosis. Cr improved to 4.5.  - Obtain urine cx -Strict I's and O's -Daily BMP to trend Cr Improvement - Consider performing a void trial on 1/23 -Avoid nephrotoxic agents -Maintenance IV fluids -PT/OT   AKI Baseline 1-1.3 with creatinine, on presentation the patient's creatinine was greatly elevated at 5.81.  This is likely due to obstruction as patient put out greater than 3.5 L after the insertion of a Foley catheter. -Avoid nephrotoxic agents -Daily BMP -Continue to monitor urinary output with strict I's and O's - Consider d/c Foley for void trial 1/23-1/24   Recent COVID infection Patient was confirmed positive  COVID-19 on 05/21/2021.  Patient states that he developed significant diarrhea after this but per his wife it has been improving. -Continue to monitor -No indications for contact precautions at this time   Paroxysmal A. fib Patient currently with regular rhythm, heart rate in the mid to upper 50s. HR this morning 61.  Home medications include Eliquis 5 mg twice daily.  Patient is also on flecainide 150 mg twice daily. -Continue home Eliquis and flecainide   Hypertension BP this morning 138/77.  Home medications include lisinopril 20 mg/day. -Hold home lisinopril in the setting of AKI   HFpEF Last echo in February 2019 with ejection fraction 55-60% and grade 2 diastolic dysfunction.  Dementia Per patient's balance at baseline he is normally oriented to himself and sometimes to location if it is a stating familiar to him. This morning he is only oriented to himself. Home medication include Aricept 10 mg at bedtime as well as Namenda -Hold home Aricept and Namenda   QTC prolongation EKG shows QTC of 501. -Avoid QT prolonging agents -Continue to monitor   History of TIA: Per chart review this was June 2012 immediately after his ablation for atrial fibrillation.  -Continue to monitor   COPD Per chart review patient has a history of COPD.  Home medications include Incruse Ellipta. -Continue Incruse Ellipta   FEN/GI: Heart healthy Prophylaxis: Eliquis   Status is: Observation  The patient remains OBS appropriate and will d/c before 2 midnights.  Dispo: The patient is from: Home              Anticipated d/c is to: Home              Anticipated  d/c date is: 1 day              Patient currently is not medically stable to d/c.   Difficult to place patient No   Subjective:  Complains of "burning" at foley site.   Objective: Temp:  [98.9 F (37.2 C)] 98.9 F (37.2 C) (01/22 2047) Pulse Rate:  [49-108] 62 (01/22 2047) Resp:  [10-32] 32 (01/22 1930) BP: (119-162)/(59-101)  156/75 (01/22 2047) SpO2:  [93 %-100 %] 98 % (01/22 2047) Weight:  [96.2 kg] 96.2 kg (01/22 0906)  Physical Exam: General: Appears tired but non-toxic, no acute distress. Age appropriate. Cardiac: RRR, normal heart sounds, no murmurs Respiratory: CTAB, normal effort Abdomen: soft, suprapubic tenderness with palpation, nondistended Extremities: No edema or cyanosis. Skin: Warm and dry, no rashes noted Neuro: alert and oriented to self only  Laboratory: Recent Labs  Lab 06/12/20 0908 06/13/20 0132  WBC 13.3* 10.6*  HGB 13.5 12.3*  HCT 40.7 37.3*  PLT 377 329   Recent Labs  Lab 06/12/20 0908 06/13/20 0132  NA 133* 139  K 4.5 4.9  CL 98 107  CO2 21* 20*  BUN 74* 66*  CREATININE 5.81* 4.15*  CALCIUM 8.8* 8.1*  PROT 6.9  --   BILITOT 0.8  --   ALKPHOS 68  --   ALT 15  --   AST 14*  --   GLUCOSE 104* 83    Imaging/Diagnostic Tests: No new imaging  Gerlene Fee, DO 06/13/2020, 5:37 AM PGY-2, [place] Intern pager: [phone], text pages welcome

## 2020-06-12 NOTE — ED Provider Notes (Signed)
[name] Provider Note   CSN: [id number] Arrival date & time: 06/12/20  0830     History Chief Complaint  Patient presents with  . Abdominal Pain    Erik [name] is a 80 y.o. male history of A. fib, CAD, dyslipidemia, hypertension, CVA.  Patient arrives from home via EMS today for evaluation of abdominal pain. He arrives with his wife who is primary historian. Patient with history of early onset dementia. Patient diagnosed with COVID at the end of December, he had been experiencing constipation since that time around 1 week ago the patient began taking suppositories and laxatives to help with his constipation. For the past 1 week he has been experiencing nonbloody diarrhea and abdominal pain. Abdominal pain is described as entire abdomen cramping ache constant nonradiating no clear aggravating or alleviating factors, they have attempted Imodium without improvement.  Additionally they were prescribed Levsin on Tuesday, patient took 1 dose afterwards patient's wife reports he began hallucinating, she stopped giving the medication and those symptoms resolved the next day.  No history of fever/chills, cough, chest pain/shortness of breath, vomiting, fall/injury or any additional concerns HPI     Past Medical History:  Diagnosis Date  . Arthritis    both knees  . Atrial fibrillation (HCC)    Paroxysmal, s/p PVI 11/11/10  . Atrial flutter ([place])    s/p RFCA  by GT  . Basal cell carcinoma of ear   . CAD (coronary artery disease)    a. cath 5/12: pLAD 25%, D1 25%, EF 65 % (done 2/2 abnormal myoview with inferapical  ischemia)  . Dyslipidemia   . Dysrhythmia   . HTN (hypertension)   . Hx of echocardiogram    Echo (9/15):  Mild LVH, EF 55-60%, no RWMA, normal diast function, mild MR, mod LAE, mildly increased pulmonary pressures (PASP 30 mmHg).  . Hypertension   . Shortness of breath dyspnea    has a low heart rate  . Snoring    a. sleep  study 11/2012 neg for OSA; increased leg movement suspicious for primary movement d/o (ie restless leg syndrome)   . Stroke [facility])    mild stroke with  second heart ablation  . TIA (transient ischemic attack)    11/12/10 post afib ablation    Patient Active Problem List   Diagnosis Date Noted  . Hydronephrosis 06/12/2020  . Atrial fibrillation with RVR ([place]) 08/10/2017  . Chest pain 08/10/2017  . ETOH abuse 02/16/2015  . Dyspnea 09/01/2014  . Snoring 09/01/2014  . Periodic limb movement disorder (PLMD) 09/01/2014  . 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 TIA (transient ischemic attack) 11/18/2010  . CAD (coronary artery disease) 10/05/2010  . Essential hypertension 02/03/2009  . PAF (paroxysmal atrial fibrillation) ([place]) 02/03/2009  . Atrial flutter ([place]) 02/03/2009    Past Surgical History:  Procedure Laterality Date  . atrial flutter ablation     by GT  . basal cell cancer resection    . CARDIOVERSION  10/31/2011   Procedure: CARDIOVERSION;  Surgeon: Jolaine Artist, MD;  Location: [facility] ENDOSCOPY;  Service: Cardiovascular;  Laterality: N/A;  . CATARACT EXTRACTION, BILATERAL    . COLONOSCOPY    . EYE SURGERY     growth under lid- x2  . HEMORRHOID SURGERY    . KNEE ARTHROSCOPY Bilateral    bilateral  . SHOULDER ARTHROSCOPY Right 08/04/2015   Procedure: ARTHROSCOPY RIGHT SHOULDER;  Surgeon: Frederik Pear, MD;  Location: [facility];  Service: Orthopedics;  Laterality: Right;  . TEE WITHOUT CARDIOVERSION  10/31/2011   Procedure: TRANSESOPHAGEAL ECHOCARDIOGRAM (TEE);  Surgeon: Jolaine Artist, MD;  Location: [facility] ENDOSCOPY;  Service: Cardiovascular;  Laterality: N/A;       Family History  Problem Relation Age of Onset  . Hypertension Mother   . Stroke Mother   . Heart disease Mother   . Arthritis Mother   . Heart attack Neg Hx     Social History   Tobacco Use  . Smoking status: Former Smoker    Packs/day: 1.50    Years: 20.00    Pack years: 30.00    Types: Cigarettes     Quit date: 05/23/1975    Years since quitting: 45.0  . Smokeless tobacco: Never Used  Vaping Use  . Vaping Use: Never used  Substance Use Topics  . Alcohol use: No    Alcohol/week: 14.0 standard drinks    Types: 14 Standard drinks or equivalent per week    Comment: has not had a drink in 9 months  . Drug use: No    Home Medications Prior to Admission medications   Medication Sig Start Date End Date Taking? Authorizing Provider  acetaminophen (TYLENOL) 500 MG tablet Take 500-1,000 mg by mouth every 6 (six) hours as needed (pain).   Yes [provider]  docusate sodium (COLACE) 100 MG capsule Take 100 mg by mouth daily as needed for mild constipation.   Yes [provider]  donepezil (ARICEPT) 10 MG tablet Take 10 mg by mouth at bedtime. 11/27/16  Yes [provider]  ELIQUIS 5 MG TABS tablet TAKE 1 TABLET BY MOUTH TWICE A DAY Patient taking differently: Take 5 mg by mouth 2 (two) times daily. 03/21/18  Yes Evans Lance, MD  FIBER PO Take 1 capsule by mouth daily.   Yes [provider]  finasteride (PROSCAR) 5 MG tablet Take 5 mg by mouth at bedtime.  08/08/17  Yes [provider]  flecainide (TAMBOCOR) 150 MG tablet TAKE 1 TABLET(150 MG) BY MOUTH TWICE DAILY Patient taking differently: Take 150 mg by mouth 2 (two) times daily. 06/18/19  Yes Evans Lance, MD  INCRUSE ELLIPTA 62.5 MCG/INH AEPB Take 1 puff by mouth daily. 01/29/17  Yes [provider]  Ketotifen Fumarate (ZADITOR OP) Place 2 drops into both eyes as needed (for itching).   Yes [provider]  lisinopril (ZESTRIL) 20 MG tablet TAKE 1/2 TABLET BY MOUTH TWICE DAILY Patient taking differently: Take 10 mg by mouth 2 (two) times daily. 06/18/19  Yes Evans Lance, MD  memantine (NAMENDA) 5 MG tablet Take 5 mg by mouth 2 (two) times daily. 03/04/20  Yes [provider]  nitroGLYCERIN (NITROSTAT) 0.4 MG SL tablet Place 1 tablet (0.4 mg total) under the  tongue every 5 (five) minutes as needed for chest pain (MAX 3 TABLETS). 05/05/14  Yes Evans Lance, MD    Allergies    Albuterol sulfate [albuterol], Adhesive [tape], and Warfarin and related  Review of Systems   Review of Systems  Unable to perform ROS: Dementia    Physical Exam Updated Vital Signs BP 140/73   Pulse (!) 59   Temp 98.9 F (37.2 C)   Resp 17   Ht 5\' 11"  (1.803 m)   Wt 96.2 kg   SpO2 98%   BMI 29.57 kg/m   Physical Exam Constitutional:      General: He is not in acute distress.    Appearance: Normal appearance. He  is well-developed. He is not ill-appearing or diaphoretic.  HENT:     Head: Normocephalic and atraumatic.  Eyes:     General: Vision grossly intact. Gaze aligned appropriately.     Pupils: Pupils are equal, round, and reactive to light.  Neck:     Trachea: Trachea and phonation normal.  Cardiovascular:     Rate and Rhythm: Normal rate and regular rhythm.  Pulmonary:     Effort: Pulmonary effort is normal. No respiratory distress.  Abdominal:     General: Bowel sounds are decreased. There is no distension.     Palpations: Abdomen is soft.     Tenderness: There is generalized abdominal tenderness. There is no guarding or rebound.  Musculoskeletal:        General: Normal range of motion.     Cervical back: Normal range of motion.  Skin:    General: Skin is warm and dry.  Neurological:     Mental Status: He is alert.     GCS: GCS eye subscore is 4. GCS verbal subscore is 5. GCS motor subscore is 6.     Comments: Speech is clear and goal oriented, follows commands Major Cranial nerves without deficit, no facial droop Moves extremities without ataxia, coordination intact  Psychiatric:        Behavior: Behavior normal.     ED Results / Procedures / Treatments   Labs (all labs ordered are listed, but only abnormal results are displayed) Labs Reviewed  CBC WITH DIFFERENTIAL/PLATELET - Abnormal; Notable for the following components:       Result Value   WBC 13.3 (*)    RBC 4.19 (*)    Neutro Abs 10.7 (*)    Monocytes Absolute 1.2 (*)    All other components within normal limits  COMPREHENSIVE METABOLIC PANEL - Abnormal; Notable for the following components:   Sodium 133 (*)    CO2 21 (*)    Glucose, Bld 104 (*)    BUN 74 (*)    Creatinine, Ser 5.81 (*)    Calcium 8.8 (*)    Albumin 3.1 (*)    AST 14 (*)    GFR, Estimated 9 (*)    All other components within normal limits  SARS CORONAVIRUS 2 (TAT 6-24 HRS)  CULTURE, BLOOD (ROUTINE X 2)  CULTURE, BLOOD (ROUTINE X 2)  LIPASE, BLOOD  URINALYSIS, ROUTINE W REFLEX MICROSCOPIC  LACTIC ACID, PLASMA  LACTIC ACID, PLASMA  MAGNESIUM  TROPONIN I (HIGH SENSITIVITY)    EKG EKG Interpretation  Date/Time:  Saturday June 12 2020 10:07:37 EST Ventricular Rate:  100 PR Interval:    QRS Duration: 179 QT Interval:  466 QTC Calculation: 602 R Axis:   -118 Text Interpretation: Atrial fibrillation Nonspecific IVCD with LAD Confirmed by Lajean Saver [phone]) on 06/12/2020 10:12:39 AM   Radiology CT ABDOMEN PELVIS WO CONTRAST  Result Date: 06/12/2020 CLINICAL DATA:  Abdomen pain for 1 week. Constipation. COVID positive. EXAM: CT ABDOMEN AND PELVIS WITHOUT CONTRAST TECHNIQUE: Multidetector CT imaging of the abdomen and pelvis was performed following the standard protocol without IV contrast. COMPARISON:  July 28, 2016 FINDINGS: Lower chest: Atelectasis of bilateral lung bases are noted. The heart size is enlarged. Hepatobiliary: The liver is normal. No focal liver lesion is identified. The gallbladder is distended. The biliary tree is unremarkable. Pancreas: Unremarkable. No pancreatic ductal dilatation or surrounding inflammatory changes. Spleen: Normal in size without focal abnormality. Adrenals/Urinary Tract: The bilateral adrenal glands are normal. There is mild bilateral hydroureteronephrosis without  focal discrete obstructing stone identified. The bladder is markedly  distended. No focal stone is identified the kidneys. Stomach/Bowel: There is a small hiatal hernia. Appendix appears normal. No evidence of bowel wall thickening, distention, or inflammatory changes. Moderate bowel content is identified in the colon. Vascular/Lymphatic: Aortic atherosclerosis. No enlarged abdominal or pelvic lymph nodes. Reproductive: Prostate gland measures 5.4 cm in diameter. Other: None. Musculoskeletal: Degenerative joint changes of the spine are noted. IMPRESSION: 1. Mild bilateral hydroureteronephrosis without focal discrete obstructing stone identified. The bladder is markedly distended. 2. No acute abnormality identified in the abdomen and pelvis. 3. Moderate bowel content is identified in the colon. Aortic Atherosclerosis (ICD10-I70.0). Electronically Signed   By: Abelardo Diesel M.D.   On: 06/12/2020 11:28    Procedures .Critical Care Performed by: Deliah Boston, PA-C Authorized by: Deliah Boston, PA-C   Critical care provider statement:    Critical care time (minutes):  40   Critical care was necessary to treat or prevent imminent or life-threatening deterioration of the following conditions:  Renal failure   Critical care was time spent personally by me on the following activities:  Discussions with consultants, evaluation of patient's response to treatment, examination of patient, ordering and performing treatments and interventions, ordering and review of laboratory studies, ordering and review of radiographic studies, pulse oximetry, re-evaluation of patient's condition, obtaining history from patient or surrogate, review of old charts and development of treatment plan with patient or surrogate   Care discussed with: admitting provider     (including critical care time)  Medications Ordered in ED Medications  sodium chloride 0.9 % bolus 1,000 mL (0 mLs Intravenous Stopped 06/12/20 1034)  morphine 2 MG/ML injection 2 mg (2 mg Intravenous Given 06/12/20 0919)   sodium chloride 0.9 % bolus 1,000 mL (1,000 mLs Intravenous New Bag/Given 06/12/20 1043)  fentaNYL (SUBLIMAZE) injection 50 mcg (50 mcg Intravenous Given 06/12/20 1042)    ED Course  I have reviewed the triage vital signs and the nursing notes.  Pertinent labs & imaging results that were available during my care of the patient were reviewed by me and considered in my medical decision making (see chart for details).  Clinical Course as of 06/12/20 1159  Sat Jun 12, 2020  1150 Dr. Beather Arbour [BM]    Clinical Course User Index [BM] Gari Crown   MDM Rules/Calculators/A&P                         Additional history obtained from: 1. Nursing notes from this visit. 2. Family, wife at bedside. 3. EMS. 4. EMR. -------------- 80 year old male presented with his wife today for evaluation abdominal pain and diarrhea that has been going on for the last week. On exam he has generalized abdominal tenderness decreased bowel sounds. Will obtain labs and CT abdomen pelvis for further evaluation. Pain medication and fluids ordered given concern for dehydration after 1 week of diarrhea, use of anticholinergic earlier this week. ------------------- I ordered, reviewed and interpreted labs which include: CBC shows leukocytosis of 13.3, no anemia. CMP shows AKI with creatinine 5.8, BUN 74, no emergent electrolyte derangement, LFT elevations or gap. Lipase within normal limits.  EKG: Atrial fibrillation Nonspecific IVCD with LAD Confirmed by Lajean Saver [phone]) on 06/12/2020 10:12:39 AM - Patient reassessed uncomfortable appearing in bed additional pain medication ordered.  Additional fluid ordered for AKI.  Informed by RN that patient had abnormal EKG showing tachycardia with nonspecific intraventricular conduction delay, reviewed  by Dr. Ashok Cordia.  I have added magnesium and troponin levels - 11:05 AM: I personally reviewed patient's CT scan, waiting for official radiology read.  The patient  appears to have very large bladder with bilateral hydro, possible urinary retention today, have asked nursing staff to obtain a bladder scan and Place Foley catheter. ----------------- CT AP:  IMPRESSION:  1. Mild bilateral hydroureteronephrosis without focal discrete  obstructing stone identified. The bladder is markedly distended.  2. No acute abnormality identified in the abdomen and pelvis.  3. Moderate bowel content is identified in the colon.    Aortic Atherosclerosis (ICD10-I70.0).     Foley placed by RN, output around 1.7 L. - 11:40 AM: Patient reassessed resting comfortably bed vital signs stable.  I have reviewed results with patient's wife, possible cause of presentation abnormalities today being acute urinary obstruction.  Plan of care is consultation with medicine team for admission and further evaluation.  Patient's wife is agreeable to plan.  Screening COVID test ordered. Urinalysis pending.  Patient seen and evaluated by Dr. Ashok Cordia during this visit who agrees with plan. - 11:50 AM: Consult with Dr. Beather Arbour, patient accepted to residency service.      Note: Portions of this report may have been transcribed using voice recognition software. Every effort was made to ensure accuracy; however, inadvertent computerized transcription errors may still be present. Final Clinical Impression(s) / ED Diagnoses Final diagnoses:  Acute urinary obstruction  AKI (acute kidney injury) [facility])    Rx / DC Orders ED Discharge Orders    None       Gari Crown 06/12/20 1159    Lajean Saver, MD 06/12/20 1431

## 2020-06-12 NOTE — ED Notes (Signed)
Pt noted to have few episodes of heart rate below 40. EKG was done, provider notified. Pt has been sleeping since transferred from another bay. Ativan was given @ 1507 due to agitation. BP, heart rate and oxygenation within normal limits.

## 2020-06-13 ENCOUNTER — Other Ambulatory Visit: Payer: Self-pay | Admitting: Internal Medicine

## 2020-06-13 DIAGNOSIS — E785 Hyperlipidemia, unspecified: Secondary | ICD-10-CM | POA: Diagnosis present

## 2020-06-13 DIAGNOSIS — I480 Paroxysmal atrial fibrillation: Secondary | ICD-10-CM | POA: Diagnosis not present

## 2020-06-13 DIAGNOSIS — N401 Enlarged prostate with lower urinary tract symptoms: Secondary | ICD-10-CM | POA: Diagnosis not present

## 2020-06-13 DIAGNOSIS — Z87891 Personal history of nicotine dependence: Secondary | ICD-10-CM | POA: Diagnosis not present

## 2020-06-13 DIAGNOSIS — N139 Obstructive and reflux uropathy, unspecified: Secondary | ICD-10-CM

## 2020-06-13 DIAGNOSIS — R001 Bradycardia, unspecified: Secondary | ICD-10-CM | POA: Diagnosis not present

## 2020-06-13 DIAGNOSIS — N1330 Unspecified hydronephrosis: Secondary | ICD-10-CM

## 2020-06-13 DIAGNOSIS — R338 Other retention of urine: Secondary | ICD-10-CM | POA: Diagnosis not present

## 2020-06-13 DIAGNOSIS — Z7951 Long term (current) use of inhaled steroids: Secondary | ICD-10-CM | POA: Diagnosis not present

## 2020-06-13 DIAGNOSIS — Z7901 Long term (current) use of anticoagulants: Secondary | ICD-10-CM | POA: Diagnosis not present

## 2020-06-13 DIAGNOSIS — Z888 Allergy status to other drugs, medicaments and biological substances status: Secondary | ICD-10-CM | POA: Diagnosis not present

## 2020-06-13 DIAGNOSIS — Z20822 Contact with and (suspected) exposure to covid-19: Secondary | ICD-10-CM | POA: Diagnosis not present

## 2020-06-13 DIAGNOSIS — Z8673 Personal history of transient ischemic attack (TIA), and cerebral infarction without residual deficits: Secondary | ICD-10-CM | POA: Diagnosis not present

## 2020-06-13 DIAGNOSIS — I440 Atrioventricular block, first degree: Secondary | ICD-10-CM | POA: Diagnosis not present

## 2020-06-13 DIAGNOSIS — Z91048 Other nonmedicinal substance allergy status: Secondary | ICD-10-CM | POA: Diagnosis not present

## 2020-06-13 DIAGNOSIS — J449 Chronic obstructive pulmonary disease, unspecified: Secondary | ICD-10-CM | POA: Diagnosis present

## 2020-06-13 DIAGNOSIS — R339 Retention of urine, unspecified: Secondary | ICD-10-CM | POA: Diagnosis not present

## 2020-06-13 DIAGNOSIS — Z79899 Other long term (current) drug therapy: Secondary | ICD-10-CM | POA: Diagnosis not present

## 2020-06-13 DIAGNOSIS — I2510 Atherosclerotic heart disease of native coronary artery without angina pectoris: Secondary | ICD-10-CM | POA: Diagnosis not present

## 2020-06-13 DIAGNOSIS — N179 Acute kidney failure, unspecified: Secondary | ICD-10-CM | POA: Diagnosis not present

## 2020-06-13 DIAGNOSIS — K5900 Constipation, unspecified: Secondary | ICD-10-CM | POA: Diagnosis present

## 2020-06-13 DIAGNOSIS — F0391 Unspecified dementia with behavioral disturbance: Secondary | ICD-10-CM | POA: Diagnosis present

## 2020-06-13 DIAGNOSIS — Z8616 Personal history of COVID-19: Secondary | ICD-10-CM | POA: Diagnosis not present

## 2020-06-13 DIAGNOSIS — Z85828 Personal history of other malignant neoplasm of skin: Secondary | ICD-10-CM | POA: Diagnosis not present

## 2020-06-13 DIAGNOSIS — R451 Restlessness and agitation: Secondary | ICD-10-CM | POA: Diagnosis not present

## 2020-06-13 DIAGNOSIS — I5032 Chronic diastolic (congestive) heart failure: Secondary | ICD-10-CM | POA: Diagnosis not present

## 2020-06-13 DIAGNOSIS — I110 Hypertensive heart disease with heart failure: Secondary | ICD-10-CM | POA: Diagnosis not present

## 2020-06-13 LAB — BASIC METABOLIC PANEL
Anion gap: 12 (ref 5–15)
Anion gap: 9 (ref 5–15)
BUN: 66 mg/dL — ABNORMAL HIGH (ref 8–23)
BUN: 57 mg/dL — ABNORMAL HIGH (ref 8–23)
CO2: 20 mmol/L — ABNORMAL LOW (ref 22–32)
CO2: 22 mmol/L (ref 22–32)
Calcium: 8.1 mg/dL — ABNORMAL LOW (ref 8.9–10.3)
Calcium: 8.3 mg/dL — ABNORMAL LOW (ref 8.9–10.3)
Chloride: 107 mmol/L (ref 98–111)
Chloride: 109 mmol/L (ref 98–111)
Creatinine, Ser: 4.15 mg/dL — ABNORMAL HIGH (ref 0.61–1.24)
Creatinine, Ser: 3.38 mg/dL — ABNORMAL HIGH (ref 0.61–1.24)
GFR, Estimated: 14 mL/min — ABNORMAL LOW (ref >60)
GFR, Estimated: 18 mL/min — ABNORMAL LOW (ref >60)
Glucose, Bld: 83 mg/dL (ref 70–99)
Glucose, Bld: 101 mg/dL — ABNORMAL HIGH (ref 70–99)
Potassium: 4.9 mmol/L (ref 3.5–5.1)
Potassium: 4.9 mmol/L (ref 3.5–5.1)
Sodium: 139 mmol/L (ref 135–145)
Sodium: 140 mmol/L (ref 135–145)

## 2020-06-13 LAB — SARS CORONAVIRUS 2 (TAT 6-24 HRS): SARS Coronavirus 2: NEGATIVE

## 2020-06-13 LAB — CBC
HCT: 37.3 % — ABNORMAL LOW (ref 39.0–52.0)
Hemoglobin: 12.3 g/dL — ABNORMAL LOW (ref 13.0–17.0)
MCH: 32.2 pg (ref 26.0–34.0)
MCHC: 33 g/dL (ref 30.0–36.0)
MCV: 97.6 fL (ref 80.0–100.0)
Platelets: 329 10*3/uL (ref 150–400)
RBC: 3.82 MIL/uL — ABNORMAL LOW (ref 4.22–5.81)
RDW: 12.2 % (ref 11.5–15.5)
WBC: 10.6 10*3/uL — ABNORMAL HIGH (ref 4.0–10.5)
nRBC: 0 % (ref 0.0–0.2)

## 2020-06-13 MED ORDER — MELATONIN 3 MG PO TABS
3.0000 mg | ORAL_TABLET | Freq: Every evening | ORAL | Status: DC | PRN
  Administered 2020-06-13 – 2020-06-14 (×2): 3 mg via ORAL
  Filled 2020-06-13 (×2): qty 1

## 2020-06-13 MED ORDER — HYDRALAZINE HCL 10 MG PO TABS
10.0000 mg | ORAL_TABLET | Freq: Three times a day (TID) | ORAL | Status: AC
  Administered 2020-06-13: 10 mg via ORAL
  Filled 2020-06-13: qty 1

## 2020-06-13 MED ORDER — DOCUSATE SODIUM 100 MG PO CAPS: 100.0000 mg | ORAL_CAPSULE | Freq: Every day | ORAL | Status: DC | PRN

## 2020-06-13 MED ORDER — HYDRALAZINE HCL 10 MG PO TABS: 10.0000 mg | ORAL_TABLET | Freq: Three times a day (TID) | ORAL | Status: DC

## 2020-06-13 NOTE — Evaluation (Signed)
Occupational Therapy Evaluation Patient Details Name: Erik [name] MRN: [id number] DOB: 25-Jun-1940 Today's Date: 06/13/2020    History of Present Illness 80 y.o. male presenting with abdominal pain and found to have urinary retention and bilateral hydronephrosis. PMH is significant for BPH, paroxysmal A. fib, CAD, hypertension, dementia, history of CVA, COVID+ 05/21/20   Clinical Impression   PTA, pt lives with spouse and has had steady decline in cognition and physical abilities since having COVID in Dec 2021. Pt has recently required assistance from spouse for bathing, dressing, mobility using RW. Pt has experienced falls at home. Pt presents now with deficits in cognition (word finding, memory, able to state birthdate but not name), standing balance, endurance and overall strength. Pt overall Mod A for bed mobility, Min A for sit to stand and Mod A for pivot transfers with RW. Hands on assist and consistent cueing needed throughout. Pt overall Mod A for UB ADLs and Max A for LB ADLs due to deficits. Wife present and assisting throughout session. Educated on benefits of short term rehab but wife/pt with preference to go home with 24/7 assist. Pt's wife is working on getting additional assistance at home through [place]. Educated on DME recommendations and their use, deferred benefits coverage questions to CM/SW. If pt to go home with 24/7 assist, recommend HHOT follow-up to maximize safety and independence in home environment.     Follow Up Recommendations  Home health OT;Supervision/Assistance - 24 hour;Other (comment) (SNF if 24/7 physical assist unable to be provided)    Equipment Recommendations  3 in 1 bedside commode;Tub/shower bench;Wheelchair (measurements OT);Wheelchair cushion (measurements OT)    Recommendations for Other Services       Precautions / Restrictions Precautions Precautions: Fall Restrictions Weight Bearing Restrictions: No      Mobility Bed Mobility Overal bed  mobility: Needs Assistance Bed Mobility: Supine to Sit     Supine to sit: Mod assist;HOB elevated     General bed mobility comments: Mod A with mulimodal cues for initiation, sequencing of task    Transfers Overall transfer level: Needs assistance Equipment used: Rolling walker (2 wheeled) Transfers: Sit to/from [facility] Sit to Stand: Min assist Stand pivot transfers: Mod assist       General transfer comment: Overall Min A for sit to stand with RW, increased time to follow cues for hand placement. Pt Mod A for pivot from bed > BSC > recliner with assistance needed to maneuver RW and light assist to maintain balance. Pt with increased need for directional cues by end of transfers    Balance Overall balance assessment: Needs assistance Sitting-balance support: Single extremity supported;Bilateral upper extremity supported;Feet supported Sitting balance-Leahy Scale: Fair     Standing balance support: Bilateral upper extremity supported;During functional activity Standing balance-Leahy Scale: Poor Standing balance comment: reliant on at least one UE support and external support with dynamic tasks                           ADL either performed or assessed with clinical judgement   ADL Overall ADL's : Needs assistance/impaired Eating/Feeding: Supervision/ safety;Sitting   Grooming: Minimal assistance;Sitting Grooming Details (indicate cue type and reason): Provided comb but pt did not initiate use. Anticipate Min A but wife assisted Upper Body Bathing: Moderate assistance;Sitting   Lower Body Bathing: Moderate assistance;Sit to/from stand   Upper Body Dressing : Moderate assistance;Sitting   Lower Body Dressing: Maximal assistance;Sit to/from stand Lower Body Dressing Details (  indicate cue type and reason): Max A to don socks sitting EOB Toilet Transfer: Minimal assistance;Stand-pivot;BSC;RW Armed forces technical officer Details (indicate cue type and  reason): Min A with RW, cues and assistance needed to manuever RW. Frequent directional cues needed for which way to turn, backing up vs going forward Toileting- Clothing Manipulation and Hygiene: Maximal assistance;Sit to/from stand Toileting - Clothing Manipulation Details (indicate cue type and reason): Max A for posterior hygiene in standing       General ADL Comments: Limited by baseline cognitive deficits, decreased balance, endurance and strength     Vision Patient Visual Report: No change from baseline Vision Assessment?: No apparent visual deficits     Perception     Praxis      Pertinent Vitals/Pain Pain Assessment: No/denies pain (Simultaneous filing. User may not have seen previous data.)     Hand Dominance Right   Extremity/Trunk Assessment Upper Extremity Assessment Upper Extremity Assessment: Generalized weakness   Lower Extremity Assessment Lower Extremity Assessment: Defer to PT evaluation   Cervical / Trunk Assessment Cervical / Trunk Assessment: Kyphotic   Communication Communication Communication: Expressive difficulties;Other (comment) (word finding difficulties)   Cognition Arousal/Alertness: Awake/alert Behavior During Therapy: WFL for tasks assessed/performed Overall Cognitive Status: History of cognitive impairments - at baseline Area of Impairment: Orientation;Attention;Memory;Following commands;Safety/judgement;Awareness;Problem solving                 Orientation Level: Disoriented to;Person;Time;Situation;Place (able to state birthday but could not tell OT name) Current Attention Level: Sustained Memory: Decreased short-term memory Following Commands: Follows one step commands with increased time Safety/Judgement: Decreased awareness of safety;Decreased awareness of deficits Awareness: Emergent ("i need to use bathroom") Problem Solving: Slow processing;Difficulty sequencing;Requires verbal cues;Requires tactile cues General  Comments: Pt with hx of cognitive impairments baseline that have progressively gotten worse since December.   General Comments  Pt wife present and engaged throughout. Pt IV bleeding/leaking at end of session, RN in to assist. IV still intact. Pt wife reports taking time off work and plans to remain at home assisting pt 24/7. Educated on option of rehab but wife Erik [name] unsure how pt would do at rehab due to cognition. Educated on various DME options to explore. Provided gait belt for wife to use with pt    Exercises     Shoulder Instructions      Home Living Family/patient expects to be discharged to:: Private residence Living Arrangements: Spouse/significant other Available Help at Discharge: Family Type of Home: House Home Access: Stairs to enter [employer] of Steps: 5-6 Entrance Stairs-Rails: Right;Left Home Layout: Two level;Able to live on main level with bedroom/bathroom     Bathroom Shower/Tub: Tub/shower unit   Bathroom Toilet: Handicapped height     Home Equipment: Environmental consultant - 2 wheels;Other (comment) (walking stick)          Prior Functioning/Environment Level of Independence: Needs assistance  Gait / Transfers Assistance Needed: prior to [place] 12/21, pt able to ambulate without difficulty/without AD. Recently has required use of RW for mobility. Wife assists getting in/out of bed, light assist for sit to stand transfers. Endorses falls at home. Wife reports EMS had to be called to assist getting pt up from ground, no fx from this fall though bruised ADL's / Homemaking Assistance Needed: Prior to [place] in 12/21, pt was able to complete ADLs. Now requires assistance from wife for dressing, toileting, sponge bathing tasks due to unable to step into tub. Communication / Swallowing Assistance Needed: decreased memory, word finding difficulties since  TIA          OT Problem List: Decreased strength;Decreased activity tolerance;Impaired balance (sitting  and/or standing);Decreased coordination;Decreased cognition;Decreased safety awareness;Decreased knowledge of use of DME or AE      OT Treatment/Interventions: Self-care/ADL training;Therapeutic exercise;DME and/or AE instruction;Therapeutic activities;Patient/family education;Balance training    OT Goals(Current goals can be found in the care plan section) Acute Rehab OT Goals Patient Stated Goal: wife would like for pt to improve mobility, decrease fall risk. OT Goal Formulation: With patient Time For Goal Achievement: 06/27/20 Potential to Achieve Goals: Good ADL Goals Pt Will Perform Grooming: with supervision;standing Pt Will Perform Upper Body Bathing: with supervision;sitting Pt Will Perform Upper Body Dressing: with supervision;sitting Pt Will Transfer to Toilet: with supervision;ambulating Pt Will Perform Toileting - Clothing Manipulation and hygiene: with supervision;sit to/from stand  OT Frequency: Min 2X/week   Barriers to D/C:            Co-evaluation              AM-PAC OT "6 Clicks" Daily Activity     Outcome Measure Help from another person eating meals?: A Little Help from another person taking care of personal grooming?: A Little Help from another person toileting, which includes using toliet, bedpan, or urinal?: A Lot Help from another person bathing (including washing, rinsing, drying)?: A Lot Help from another person to put on and taking off regular upper body clothing?: A Little Help from another person to put on and taking off regular lower body clothing?: A Lot 6 Click Score: 15   End of Session Equipment Utilized During Treatment: Gait belt;Rolling walker Nurse Communication: Mobility status;Other (comment) (IV malfunction)  Activity Tolerance: Patient tolerated treatment well Patient left: in chair;with call bell/phone within reach;with family/visitor present;with nursing/sitter in room;Other (comment) (No chair alarm pads in closet, staff aware  and wife present with pt on exit)  OT Visit Diagnosis: Unsteadiness on feet (R26.81);Other abnormalities of gait and mobility (R26.89);Muscle weakness (generalized) (M62.81);Other symptoms and signs involving cognitive function                Time: 9767-3419 OT Time Calculation (min): 44 min Charges:  OT General Charges $OT Visit: 1 Visit OT Evaluation $OT Eval Moderate Complexity: 1 Mod OT Treatments $Self Care/Home Management : 8-22 mins $Therapeutic Activity: 8-22 mins  Layla Maw, OTR/L  Layla Maw 06/13/2020, 12:43 PM

## 2020-06-13 NOTE — Progress Notes (Signed)
Interim progress note  Received page from [employer] regarding patient's elevated blood pressures of 620 and 355 systolic.  After speaking with pharmacy and considering all of patient's comorbidities, decided to give patient 1 dose of hydralazine 5 mg, RN Martin Majestic notified and asked to recheck patient's blood pressure in 2 hours (around 11 PM).  Milus Banister, [place], PGY-3 06/13/2020 8:50 PM

## 2020-06-13 NOTE — Progress Notes (Signed)
PT Cancellation Note  Patient Details Name: Erik [name] MRN: [id number] DOB: November 07, 1940   Cancelled Treatment:    Reason Eval/Treat Not Completed: Fatigue/lethargy limiting ability to participate  Nurse tech exiting room on arrival. Stated she had just assisted pt back to bed after sitting up several hours. Entered room and pt sound asleep. Wife reports his bottom was sore from being up so long and that he had actually fallen asleep in the recliner before return to bed.   Will attempt to return later today or will evaluate 1/24.   Arby Barrette, PT Pager [phone]  Rexanne Mano 06/13/2020, 1:30 PM

## 2020-06-13 NOTE — Progress Notes (Signed)
Confirmed with central monitoring about vitals, sinus rhythm 60s (63). BP is 137/90 and O2 sats are now 100% on 3 L which was started when O2 sat was showing up low.

## 2020-06-13 NOTE — Progress Notes (Signed)
Arrived to 6N17, alert and oriented to self. accompanied by wife. No acute distress noted. No DOB/SOB, 98%/RA. No [place]ady[place]ia PR @60 's. No facial grimacing noted as well. Patient was oriented call bell and surroundings. Positioned bed in lowest position. Foley cath in place. Call bell within reach and will continue to close monitor patient.

## 2020-06-13 NOTE — Evaluation (Signed)
Clinical/Bedside Swallow Evaluation Patient Details  Name: Erik [name] MRN: [id number] Date of Birth: 02/22/1941  Today's Date: 06/13/2020 Time: SLP Start Time (ACUTE ONLY): 31 SLP Stop Time (ACUTE ONLY): 1140 SLP Time Calculation (min) (ACUTE ONLY): 10 min  Past Medical History:  Past Medical History:  Diagnosis Date  . Arthritis    both knees  . Atrial fibrillation (HCC)    Paroxysmal, s/p PVI 11/11/10  . Atrial flutter ([place])    s/p RFCA  by GT  . Basal cell carcinoma of ear   . CAD (coronary artery disease)    a. cath 5/12: pLAD 25%, D1 25%, EF 65 % (done 2/2 abnormal myoview with inferapical  ischemia)  . Dyslipidemia   . Dysrhythmia   . HTN (hypertension)   . Hx of echocardiogram    Echo (9/15):  Mild LVH, EF 55-60%, no RWMA, normal diast function, mild MR, mod LAE, mildly increased pulmonary pressures (PASP 30 mmHg).  . Hypertension   . Shortness of breath dyspnea    has a low heart rate  . Snoring    a. sleep study 11/2012 neg for OSA; increased leg movement suspicious for primary movement d/o (ie restless leg syndrome)   . Stroke [facility])    mild stroke with  second heart ablation  . TIA (transient ischemic attack)    11/12/10 post afib ablation   Past Surgical History:  Past Surgical History:  Procedure Laterality Date  . atrial flutter ablation     by GT  . basal cell cancer resection    . CARDIOVERSION  10/31/2011   Procedure: CARDIOVERSION;  Surgeon: Jolaine Artist, MD;  Location: [facility] ENDOSCOPY;  Service: Cardiovascular;  Laterality: N/A;  . CATARACT EXTRACTION, BILATERAL    . COLONOSCOPY    . EYE SURGERY     growth under lid- x2  . HEMORRHOID SURGERY    . KNEE ARTHROSCOPY Bilateral    bilateral  . SHOULDER ARTHROSCOPY Right 08/04/2015   Procedure: ARTHROSCOPY RIGHT SHOULDER;  Surgeon: Frederik Pear, MD;  Location: [place];  Service: Orthopedics;  Laterality: Right;  . TEE WITHOUT CARDIOVERSION  10/31/2011   Procedure: TRANSESOPHAGEAL ECHOCARDIOGRAM (TEE);   Surgeon: Jolaine Artist, MD;  Location: [facility] ENDOSCOPY;  Service: Cardiovascular;  Laterality: N/A;   HPI:  Erik [name] is a 80 y.o. male presenting with abdominal pain and found to have urinary retention and bilateral hydronephrosis. PMH is significant for dementia,  BPH, paroxysmal A. fib, CAD, hypertension, dementia, history of CVA, COVID 05/21/2021.   Assessment / Plan / Recommendation Clinical Impression  Patient presents with a normal oropharyngeal swallow with swift oral transit of bolus, normal appearing pharyngeal timing and no overt indication of aspiration with solids or liquids despite being challenged with multiple consecute straw sips. No SLP f/u indicated. SLP Visit Diagnosis: Dysphagia, unspecified (R13.10)    Aspiration Risk  No limitations    Diet Recommendation Thin liquid;Regular   Liquid Administration via: Cup;Straw Medication Administration: Whole meds with liquid Supervision: Patient able to self feed Compensations: Slow rate;Small sips/bites Postural Changes: Seated upright at 90 degrees    Other  Recommendations Oral Care Recommendations: Oral care BID   Follow up Recommendations None        Swallow Study   General HPI: Erik [name] is a 80 y.o. male presenting with abdominal pain and found to have urinary retention and bilateral hydronephrosis. PMH is significant for dementia,  BPH, paroxysmal A. fib, CAD, hypertension, dementia, history of CVA, COVID 05/21/2021.  Type of Study: Bedside Swallow Evaluation Previous Swallow Assessment: none Diet Prior to this Study: Regular;Thin liquids Temperature Spikes Noted: No Respiratory Status: Room air History of Recent Intubation: No Behavior/Cognition: Alert;Cooperative;Pleasant mood;Requires cueing Oral Cavity Assessment: Within Functional Limits Oral Care Completed by SLP: No Oral Cavity - Dentition: Adequate natural dentition Vision: Functional for self-feeding Self-Feeding Abilities: Able to  feed self Patient Positioning: Upright in chair Baseline Vocal Quality: Normal Volitional Cough: Cognitively unable to elicit Volitional Swallow: Unable to elicit    Oral/Motor/Sensory Function Overall Oral Motor/Sensory Function: Within functional limits   Ice Chips Ice chips: Not tested   Thin Liquid Thin Liquid: Within functional limits Presentation: Self Fed;Straw    Nectar Thick Nectar Thick Liquid: Not tested   Honey Thick Honey Thick Liquid: Not tested   Puree Puree: Not tested   Solid     Solid: Within functional limits Presentation: Self Fed     Lylia Karn MA, CCC-SLP   Mauria Asquith Meryl 06/13/2020,11:43 AM

## 2020-06-14 ENCOUNTER — Inpatient Hospital Stay (HOSPITAL_COMMUNITY): Payer: Medicare Other

## 2020-06-14 ENCOUNTER — Telehealth: Payer: Self-pay | Admitting: Internal Medicine

## 2020-06-14 DIAGNOSIS — N179 Acute kidney failure, unspecified: Secondary | ICD-10-CM | POA: Diagnosis not present

## 2020-06-14 DIAGNOSIS — R339 Retention of urine, unspecified: Secondary | ICD-10-CM | POA: Diagnosis not present

## 2020-06-14 DIAGNOSIS — N1330 Unspecified hydronephrosis: Secondary | ICD-10-CM | POA: Diagnosis not present

## 2020-06-14 LAB — CBC
HCT: 36.9 % — ABNORMAL LOW (ref 39.0–52.0)
Hemoglobin: 12.8 g/dL — ABNORMAL LOW (ref 13.0–17.0)
MCH: 33.4 pg (ref 26.0–34.0)
MCHC: 34.7 g/dL (ref 30.0–36.0)
MCV: 96.3 fL (ref 80.0–100.0)
Platelets: 321 10*3/uL (ref 150–400)
RBC: 3.83 MIL/uL — ABNORMAL LOW (ref 4.22–5.81)
RDW: 12.3 % (ref 11.5–15.5)
WBC: 9.7 10*3/uL (ref 4.0–10.5)
nRBC: 0 % (ref 0.0–0.2)

## 2020-06-14 LAB — BASIC METABOLIC PANEL
Anion gap: 13 (ref 5–15)
BUN: 38 mg/dL — ABNORMAL HIGH (ref 8–23)
CO2: 21 mmol/L — ABNORMAL LOW (ref 22–32)
Calcium: 8.5 mg/dL — ABNORMAL LOW (ref 8.9–10.3)
Chloride: 108 mmol/L (ref 98–111)
Creatinine, Ser: 2.25 mg/dL — ABNORMAL HIGH (ref 0.61–1.24)
GFR, Estimated: 29 mL/min — ABNORMAL LOW (ref >60)
Glucose, Bld: 100 mg/dL — ABNORMAL HIGH (ref 70–99)
Potassium: 4.7 mmol/L (ref 3.5–5.1)
Sodium: 142 mmol/L (ref 135–145)

## 2020-06-14 LAB — URINE CULTURE: Culture: NO GROWTH

## 2020-06-14 MED ORDER — AMLODIPINE BESYLATE 5 MG PO TABS
5.0000 mg | ORAL_TABLET | Freq: Every day | ORAL | Status: DC
Start: 2020-06-14 — End: 2020-06-16
  Administered 2020-06-14 – 2020-06-15 (×2): 5 mg via ORAL
  Filled 2020-06-14 (×2): qty 1

## 2020-06-14 MED ORDER — HALOPERIDOL LACTATE 5 MG/ML IJ SOLN: 2.0000 mg | Freq: Four times a day (QID) | INTRAMUSCULAR | Status: DC | PRN

## 2020-06-14 MED ORDER — CHLORHEXIDINE GLUCONATE CLOTH 2 % EX PADS
6.0000 | MEDICATED_PAD | Freq: Every day | CUTANEOUS | Status: DC
  Administered 2020-06-14 – 2020-06-15 (×2): 6 via TOPICAL

## 2020-06-14 NOTE — Evaluation (Signed)
Physical Therapy Evaluation Patient Details Name: Erik [name] MRN: [id number] DOB: Dec 24, 1940 Today's Date: 06/14/2020   History of Present Illness  80 y.o. male presenting with abdominal pain and found to have urinary retention and bilateral hydronephrosis. PMH is significant for BPH, paroxysmal A. fib, CAD, hypertension, dementia, history of CVA, COVID+ 05/21/20  Clinical Impression  PTA, patient lives with spouse and has had steady decline in cognition and physical abilities since having COVID in 04/2020. Patient has recently required assistance from spouse for bathing, dressing, mobility using RW. Patient has experienced falls at home. Patient presents with impaired cognition, impaired balance, decreased activity tolerance, generalized weakness, and impaired functional mobility. Patient overall at [facility] level for all mobility with use of RW. Patient requires max multimodal directional cues and assist, easily confused with cueing. Wife present and assisted during session. Deferred further ambulation this session due to inability to follow commands. Required physical assist to maintain sitting on BSC. Patient will benefit from skilled PT services during acute stay to address listed deficits. Discussed with wife about need for rehab following d/c, however wife not agreeable to recommendation and wants to take patient home. Also discussed with wife about patient needing +2 A due to impaired cognition and inability to follow directional cues/commands. Recommend HHPT follow up at d/c to maximize functional mobility and safety in the home environment. If family unable to provide 24/7 supervision/assist, recommend SNF at d/c.      Follow Up Recommendations Home health PT;Supervision/Assistance - 24 hour (If unable to provide 24/7 supervision/assistance, recommend SNF at d/c)    Equipment Recommendations  3in1 (PT);Wheelchair (measurements PT);Wheelchair cushion (measurements PT)    Recommendations  for Other Services       Precautions / Restrictions Precautions Precautions: Fall Restrictions Weight Bearing Restrictions: No      Mobility  Bed Mobility Overal bed mobility: Needs Assistance Bed Mobility: Supine to Sit;Sit to Supine     Supine to sit: Mod assist;HOB elevated Sit to supine: Min assist   General bed mobility comments: Mod A with mulimodal cues for initiation, sequencing of task. MinA to return to bed with assist for LEs    Transfers Overall transfer level: Needs assistance Equipment used: Rolling Esraa Seres (2 wheeled) Transfers: Sit to/from [facility] Sit to Stand: Mod assist Stand pivot transfers: Mod assist       General transfer comment: modA to rise from EOB with multimodal cues for hand placement and sequencing. Assist for placing hands on RW in proper place. ModA for stand pivot transfer bed<>BSC with max directional cues and tactile facilitation. Patient following 25-50% of commands. Wife states this is not his norm and usually is able to follow commands and that it may be due to the medications they gave him when he first got here. Physical assist to maintain sitting on [facility]  Ambulation/Gait                Stairs            Wheelchair Mobility    Modified Rankin (Stroke Patients Only)       Balance Overall balance assessment: Needs assistance Sitting-balance support: Bilateral upper extremity supported;Feet supported Sitting balance-Leahy Scale: Fair     Standing balance support: Bilateral upper extremity supported;During functional activity Standing balance-Leahy Scale: Poor Standing balance comment: reliant on UE support and external assist with dynamic tasks  Pertinent Vitals/Pain Pain Assessment: No/denies pain    Home Living Family/patient expects to be discharged to:: Private residence Living Arrangements: Spouse/significant other Available Help at Discharge:  Family Type of Home: House Home Access: Stairs to enter Entrance Stairs-Rails: Psychiatric nurse of Steps: 5-6 Home Layout: Two level;Able to live on main level with bedroom/bathroom Home Equipment: Gilford Rile - 2 wheels;Other (comment) (walking stick)      Prior Function Level of Independence: Needs assistance   Gait / Transfers Assistance Needed: prior to [place] 12/21, pt able to ambulate without difficulty/without AD. Recently has required use of RW for mobility. Wife assists getting in/out of bed, light assist for sit to stand transfers. Endorses falls at home. Wife reports EMS had to be called to assist getting pt up from ground, no fx from this fall though bruised  ADL's / Homemaking Assistance Needed: Prior to [place] in 12/21, pt was able to complete ADLs. Now requires assistance from wife for dressing, toileting, sponge bathing tasks due to unable to step into tub.        Hand Dominance   Dominant Hand: Right    Extremity/Trunk Assessment   Upper Extremity Assessment Upper Extremity Assessment: Defer to OT evaluation    Lower Extremity Assessment Lower Extremity Assessment: Generalized weakness    Cervical / Trunk Assessment Cervical / Trunk Assessment: Kyphotic  Communication   Communication: Expressive difficulties;Other (comment) (word finding difficulties)  Cognition Arousal/Alertness: Awake/alert Behavior During Therapy: WFL for tasks assessed/performed Overall Cognitive Status: History of cognitive impairments - at baseline Area of Impairment: Orientation;Attention;Memory;Following commands;Safety/judgement;Awareness;Problem solving                 Orientation Level: Disoriented to;Place;Time;Situation Current Attention Level: Sustained Memory: Decreased short-term memory Following Commands: Follows one step commands with increased time;Follows one step commands inconsistently Safety/Judgement: Decreased awareness of safety;Decreased  awareness of deficits Awareness: Emergent Problem Solving: Slow processing;Difficulty sequencing;Requires verbal cues;Requires tactile cues General Comments: Pt with hx of cognitive impairments baseline that have progressively gotten worse since December.      General Comments General comments (skin integrity, edema, etc.): Wife present and engaged throughout session. Wife states she will be retiring to care for him at home. Educated wife on need for +2 assistance due to patient having difficulty following directional cues and requiring physical assistance to complete tasks. Wife verbally understands. Discussed with wife about need for rehab, wife not agreeable to recommendation    Exercises     Assessment/Plan    PT Assessment Patient needs continued PT services  PT Problem List Decreased strength;Decreased activity tolerance;Decreased balance;Decreased mobility;Decreased cognition;Decreased coordination;Decreased safety awareness;Decreased knowledge of use of DME       PT Treatment Interventions DME instruction;Gait training;Stair training;Functional mobility training;Therapeutic activities;Therapeutic exercise;Balance training;Patient/family education    PT Goals (Current goals can be found in the Care Plan section)  Acute Rehab PT Goals Patient Stated Goal: wife would like for pt to improve mobility, decrease fall risk. PT Goal Formulation: With family Time For Goal Achievement: 06/28/20 Potential to Achieve Goals: Fair    Frequency Min 3X/week   Barriers to discharge        Co-evaluation               AM-PAC PT "6 Clicks" Mobility  Outcome Measure Help needed turning from your back to your side while in a flat bed without using bedrails?: A Lot Help needed moving from lying on your back to sitting on the side of a flat bed without using bedrails?:  A Lot Help needed moving to and from a bed to a chair (including a wheelchair)?: A Lot Help needed standing up from a  chair using your arms (e.g., wheelchair or bedside chair)?: A Lot Help needed to walk in hospital room?: A Lot Help needed climbing 3-5 steps with a railing? : A Lot 6 Click Score: 12    End of Session Equipment Utilized During Treatment: Gait belt Activity Tolerance: Patient tolerated treatment well Patient left: in bed;with call bell/phone within reach;with bed alarm set Nurse Communication: Mobility status PT Visit Diagnosis: Unsteadiness on feet (R26.81);Muscle weakness (generalized) (M62.81);Other abnormalities of gait and mobility (R26.89)    Time: 1202-1232 PT Time Calculation (min) (ACUTE ONLY): 30 min   Charges:   PT Evaluation $PT Eval Moderate Complexity: 1 Mod PT Treatments $Therapeutic Activity: 8-22 mins        Maritza Hosterman A. Gilford Rile PT, DPT Acute Rehabilitation Services Pager [phone] Office [phone]   Alda Lea 06/14/2020, 12:47 PM

## 2020-06-14 NOTE — Telephone Encounter (Signed)
Came to hospital on Saturday and wife saying that they gave him medication due to him being agitated for constantly sticking him to get blood and it drastically reduced his heartrate and now its high again. Wife is really worried about patient's heart and would like for Dr. Lovena Le or nurse to please give her a call.

## 2020-06-14 NOTE — Telephone Encounter (Signed)
Per review of hospitalist note-elevated heart rate has been addressed.  Will await outpatient needs.

## 2020-06-14 NOTE — Progress Notes (Signed)
Pt refuses V/S to be taken x 3 attempts. Tries to swing his arms around the writer states ' No'. Writer is also unable to fix his telebox as well . continuous Pulse ox ON with PR 60's and 93%on room air.not in any distress. Writer tried to redirect and reassure patient, turned on calming music, then patient calmed down.   will continue to close monitor patient

## 2020-06-14 NOTE — Progress Notes (Signed)
Interim Progress Note  Visited patient in his room after receiving page that he was becoming agitated. Nurses had worked to redirect him, however he is often agitated, difficult to redirect and had even become combative with staff. By the time I got to the room the patient had been redirected, he was resting in bed and the nurses had put calming music on in his room.   While the patient is redirected now, the safety of nursing staff is a top priority. After speaking with pharmacy I have placed an order for Haldol 2mg  IV as needed for agitation, combative behavior. Discussed with RN Jewel.  Milus Banister, [place], PGY-3 06/14/2020 2:05 AM

## 2020-06-14 NOTE — Progress Notes (Addendum)
Interim progress note  Spoke with patient's RN Rose for the evening.  Patient had a bladder scan around 6:30 PM which revealed 680 cc in the bladder, 1 in and out cath was performed around 7 PM.  Plan for the evening is to BladderScan the patient again around 1 AM.  If there is greater than 300 cc in patient's bladder, the plan is to have the patient sit up on bedside commode or stand to try to urinate.  If this is unsuccessful then will perform in and out cath.    Repeat the same plan around 6 AM.  If the patient is unable to void on his own at 6 AM will instead place Foley catheter.   Please contact intern pager [phone] (via phone or AMION, login: [email or web address]) for questions regarding care. Text pages welcome. DO NOT page or secure chat the listed attending provider unless there is no answer from the number above.  Milus Banister, [place], PGY-3 06/14/2020 8:55 PM

## 2020-06-14 NOTE — Progress Notes (Addendum)
Family Medicine Teaching Service Daily Progress Note Intern Pager: [phone]  Patient name: Erik [name] Medical record number: [id number] Date of birth: 06/09/1940 Age: 80 y.o. Gender: male  Primary Care Provider: Alroy [name], Erik Sa, MD Consultants: None Code Status: FULL  Pt Overview and Major Events to Date:  1/22: admitted  Assessment and Plan:  Erik C Tysingeris a 80 y.o.malepresenting with abdominal pain and found to have urinary retention and bilateral hydronephrosis. and found to have urinary retention and bilateral hydronephrosis. PMH is significant forBPH, paroxysmal A. fib, CAD, hypertension, dementia,history of CVA.  Acute Urinary Retention with Bilateral Hydronephrosis Hydronephrosis resolved on repeat renal u/s. Foley in place. Patient had 2L urine output yesterday. Etiology of initial retention is secondary to anticholinergic use (also with underlying BPH). -Void trial today -Strict Is/Os -Continue home finasteride  AKI Improving. Cr 3.38 yesterday afternoon (from 5.8 on admission). Am BMP pending. Etiology is post-obstructive due to urinary retention (see above) -Daily BMP -Avoid nephrotoxic agents  Dementia with behavioral disturbance Patient intermittently agitated. Oriented only to person at baseline. -Delirium precautions -Verbal re-direction for agitation -Haldol prn for severe agitation that threatens the safety of patient or staff  Paroxysmal A-Fib Wife concerned that his HR was elevated up to 120s this morning. Tachycardia seemed to resolved fairly quickly as STAT EKG was obtained and shows sinus brady with 1st degree AV block.  Home meds: Eliquis 5mg  BID and Flecainide 150mg  BID -Continue home Eliquis and Flecainide -Will continue to monitor HR closely  HTN BP has been elevated up to 341 systolic. He was given Hydral x1 overnight. -Will d/c home Lisinopril (due to AKI, no other indication for ACE) -Will start Amlodipine 5mg  daily and titrate as needed  HFpEF Last echo Feb 2019 showed EF 55-60% with grade  II diastolic function  QTc Prolongation QTc 501 on EKG. -Avoid QT prolonging agents  COPD Chronic, stable. Home meds: Incruse Ellipta -Continue home incruse  FEN/GI: Heart healthy diet PPx: Eliquis   Status is: Inpatient Remains inpatient appropriate because:Ongoing diagnostic testing needed not appropriate for outpatient work up   Dispo: The patient is from: Home              Anticipated d/c is to: Home              Anticipated d/c date is: 2 days              Patient currently is not medically stable to d/c.   Difficult to place patient No   Subjective:  Patient had some episodes of agitation overnight, but was eventually able to be re-directed and calmed down without medication intervention. This morning he has no specific complaints.  Objective: Temp:  [97.9 F (36.6 C)-98.5 F (36.9 C)] 98.5 F (36.9 C) (01/23 2008) Pulse Rate:  [59-96] 59 (01/23 2008) Resp:  [16-18] 18 (01/23 2008) BP: (137-192)/(76-90) 168/82 (01/23 2328) SpO2:  [96 %-98 %] 98 % (01/23 2008) Physical Exam: General: alert, resting comfortably in bed, NAD Cardiovascular: RRR, normal S1/S2 Respiratory: normal WOB on room air Abdomen: soft, nontender, nondistended Extremities: no peripheral edema Neuro: oriented to person only  Laboratory: Recent Labs  Lab 06/12/20 0908 06/13/20 0132  WBC 13.3* 10.6*  HGB 13.5 12.3*  HCT 40.7 37.3*  PLT 377 329   Recent Labs  Lab 06/12/20 0908 06/13/20 0132 06/13/20 1427  NA 133* 139 140  K 4.5 4.9 4.9  CL 98 107 109  CO2 21* 20* 22  BUN 74* 66* 57*  CREATININE 5.81* 4.15* 3.38*  CALCIUM 8.8* 8.1* 8.3*  PROT 6.9  --   --   BILITOT 0.8  --   --   ALKPHOS 68  --   --   ALT 15  --   --   AST 14*  --   --   GLUCOSE 104* 83 101*     Imaging/Diagnostic Tests: US RENAL Result Date: 06/14/2020 IMPRESSION: Interval resolution of hydronephrosis.    Alcus Dad, MD 06/14/2020, 6:56 AM PGY-1, [place] Intern pager:  [phone], text pages welcome

## 2020-06-15 DIAGNOSIS — N139 Obstructive and reflux uropathy, unspecified: Secondary | ICD-10-CM | POA: Diagnosis not present

## 2020-06-15 DIAGNOSIS — N179 Acute kidney failure, unspecified: Secondary | ICD-10-CM | POA: Diagnosis not present

## 2020-06-15 DIAGNOSIS — N1330 Unspecified hydronephrosis: Secondary | ICD-10-CM | POA: Diagnosis not present

## 2020-06-15 DIAGNOSIS — R001 Bradycardia, unspecified: Secondary | ICD-10-CM

## 2020-06-15 LAB — BASIC METABOLIC PANEL
Anion gap: 10 (ref 5–15)
BUN: 26 mg/dL — ABNORMAL HIGH (ref 8–23)
CO2: 24 mmol/L (ref 22–32)
Calcium: 8.9 mg/dL (ref 8.9–10.3)
Chloride: 108 mmol/L (ref 98–111)
Creatinine, Ser: 1.79 mg/dL — ABNORMAL HIGH (ref 0.61–1.24)
GFR, Estimated: 38 mL/min — ABNORMAL LOW (ref >60)
Glucose, Bld: 103 mg/dL — ABNORMAL HIGH (ref 70–99)
Potassium: 4.6 mmol/L (ref 3.5–5.1)
Sodium: 142 mmol/L (ref 135–145)

## 2020-06-15 MED ORDER — FLECAINIDE ACETATE 150 MG PO TABS: ORAL_TABLET | ORAL | 0 refills | Status: DC

## 2020-06-15 MED ORDER — TAMSULOSIN HCL 0.4 MG PO CAPS: 0.4000 mg | ORAL_CAPSULE | Freq: Every day | ORAL | 0 refills | Status: AC

## 2020-06-15 MED ORDER — AMLODIPINE BESYLATE 5 MG PO TABS: 5.0000 mg | ORAL_TABLET | Freq: Every day | ORAL | 0 refills | Status: DC

## 2020-06-15 MED ORDER — LIDOCAINE HCL URETHRAL/MUCOSAL 2 % EX GEL
1.0000 "application " | Freq: Once | CUTANEOUS | Status: DC
  Filled 2020-06-15: qty 11

## 2020-06-15 MED ORDER — TAMSULOSIN HCL 0.4 MG PO CAPS
0.4000 mg | ORAL_CAPSULE | Freq: Every day | ORAL | Status: DC
  Administered 2020-06-15: 0.4 mg via ORAL
  Filled 2020-06-15: qty 1

## 2020-06-15 MED ORDER — POLYETHYLENE GLYCOL 3350 17 G PO PACK: 17.0000 g | PACK | Freq: Every day | ORAL | 0 refills | Status: AC | PRN

## 2020-06-15 NOTE — Discharge Instructions (Signed)
Dear Erik [name],   Thank you for letting us participate in your care! In this section, you will find a brief summary of why you were admitted to the hospital, what happened during your admission, your diagnosis/diagnoses, and recommended follow up.   You were admitted because you were experiencing abdominal pain  You were diagnosed with urinary retention and kidney dysfunction  You were treated with a catheter, as well as medication called Flomax  Your kidney function improved and you were discharged from the hospital for meeting this goal.    POST-HOSPITAL & CARE INSTRUCTIONS 1. Please call the urology office at [phone] to set up an appointment for next week.  2. Please call your cardiologist to schedule a sooner appointment (ideally within 2 weeks). 3. Please let PCP/Specialists know of any changes in medications that were made.  4. Please see medications section of this packet for any medication changes.   DOCTOR'S APPOINTMENTS & FOLLOW UP Future Appointments  Date Time Provider [place]  08/03/2020 11:45 AM Evans Lance, MD [facility]     Thank you for choosing [facility]! Take care and be well!  [facility]  [place], [place] [address] [phone]

## 2020-06-15 NOTE — Progress Notes (Signed)
DC pt with dc instructions and urinary supplies to significant other and teach back about foley care and leg bag instruction.

## 2020-06-15 NOTE — Discharge Summary (Signed)
[name] Medical record number: [id number] Date of birth: 24-Oct-1940 Age: 80 y.o. Gender: male Date of Admission: 06/12/2020  Date of Discharge: 06/15/2020 Admitting Physician: Kinnie Feil, MD  Primary Care Provider: Alroy Dust, Carlean Jews.Marlou Sa, MD Consultants: Urology  Indication for Hospitalization: Acute urinary retention, AKI  Discharge Diagnoses/Problem List:  Acute urinary retention with bilateral hydronephrosis BPH AKI Paroxysmal A-fib First-degree AV block Dementia Hypertension History of CVA CAD  Disposition: Home with home health  Discharge Condition: Improved, stable  Discharge Exam:  General: alert, resting comfortably in bed, NAD Cardiovascular: RRR, normal S1/S2 Respiratory: normal WOB on room air Abdomen: soft, nontender, nondistended Extremities: no peripheral edema Neuro: alert, oriented to person, place and time  Brief Hospital Course:  Erik [name] is a 80 year old male admitted for urinary retention with bilateral hydronephrosis and AKI.  Hospital course is outlined below by problem:  Acute urinary retention with bilateral hydronephrosis Patient presented with 2 to 3 days of reduced urine output after being prescribed anticholinergic (hyoscyamine) for diarrhea.  Foley was placed in the ED and 1.7 L urine removed.  CT abdomen pelvis showed markedly distended bladder and bilateral hydronephrosis without discrete obstruction. On 1/24, renal ultrasound showed resolution of hydronephrosis. However, patient failed a voiding trial so foley was replaced. Urology recommended outpatient follow-up in 1 week and initiation of Flomax.   AKI Patient's creatinine on admission was 5.81 (baseline ~1.2). Etiology was thought to be post-renal (obstructive) in the setting of urinary retention. Patient's creatinine gradually improved after Foley catheter placement. Creatinine was 1.79 on day of  discharge.   Paroxysmal A-Fib  1st degree AV block Patient with a hx of paroxysmal a-fib, on flecainide and Eliquis at home. Patient was in sinus rhythm throughout admission, but was often bradycardic in the 50s and intermittently dropped into the 40s and 30s. Multiple EKGs showed sinus brady with 1st degree AV block. Aricept and Namenda were discontinued due to possibility they were contributing to bradycardia/AV block. Flecainide level obtained, but still pending at time of discharge. Recommend close outpatient cardiology follow-up.  Hypertension Patient has a history of hypertension, previously on lisinopril 20 mg daily at home.  Given his AKI, his lisinopril was discontinued and he was instead started on amlodipine 5 mg daily.  Recommend outpatient PCP follow-up for monitoring of his BP and medication adjustments as needed.  Issues for Follow Up:  1. Urology follow-up to ensure resolution of urinary retention, and for long-term management of BPH 2. Close cardiology follow-up for bradycardia with 1st degree AV block 3. PCP follow-up for blood pressure management (switched from lisinopril 20mg  to amlodipine 5mg  daily)  Significant Procedures: None  Significant Labs and Imaging:  Recent Labs  Lab 06/12/20 0908 06/13/20 0132 06/14/20 1237  WBC 13.3* 10.6* 9.7  HGB 13.5 12.3* 12.8*  HCT 40.7 37.3* 36.9*  PLT 377 329 321   Recent Labs  Lab 06/12/20 0908 06/12/20 1331 06/13/20 0132 06/13/20 1427 06/14/20 1237 06/15/20 0349  NA 133*  --  139 140 142 142  K 4.5  --  4.9 4.9 4.7 4.6  CL 98  --  107 109 108 108  CO2 21*  --  20* 22 21* 24  GLUCOSE 104*  --  83 101* 100* 103*  BUN 74*  --  66* 57* 38* 26*  CREATININE 5.81*  --  4.15* 3.38* 2.25* 1.79*  CALCIUM 8.8*  --  8.1* 8.3* 8.5* 8.9  MG  --  2.1  --   --   --   --   ALKPHOS 68  --   --   --   --   --   AST 14*  --   --   --   --   --   ALT 15  --   --   --   --   --   ALBUMIN 3.1*  --   --   --   --   --      Results/Tests Pending at Time of Discharge: Flecainide level  Discharge Medications:  Allergies as of 06/15/2020      Reactions   Albuterol Sulfate [albuterol] Other (See Comments)   rapid heartbeat; afib   Adhesive [tape] Rash, Other (See Comments)   New heart monitor/patch (adhesive) broke out the skin and left a large red patch (still visible after 3 weeks   Warfarin And Related Other (See Comments)   Patient's INR could never get regulated while on this      Medication List    STOP taking these medications   docusate sodium 100 MG capsule Commonly known as: COLACE   donepezil 10 MG tablet Commonly known as: ARICEPT   lisinopril 20 MG tablet Commonly known as: ZESTRIL   memantine 5 MG tablet Commonly known as: NAMENDA     TAKE these medications   acetaminophen 500 MG tablet Commonly known as: TYLENOL Take 500-1,000 mg by mouth every 6 (six) hours as needed (pain).   amLODipine 5 MG tablet Commonly known as: NORVASC Take 1 tablet (5 mg total) by mouth daily. Start taking on: June 16, 2020   Eliquis 5 MG Tabs tablet Generic drug: apixaban TAKE 1 TABLET BY MOUTH TWICE A DAY What changed: how much to take   FIBER PO Take 1 capsule by mouth daily.   finasteride 5 MG tablet Commonly known as: PROSCAR Take 5 mg by mouth at bedtime.   flecainide 150 MG tablet Commonly known as: TAMBOCOR TAKE 1 TABLET(150 MG) BY MOUTH TWICE DAILY. Please keep upcoming appt in March 2022 with Dr. Lovena Le before anymore refills. Thank you What changed: See the new instructions.   Incruse Ellipta 62.5 MCG/INH Aepb Generic drug: umeclidinium bromide Take 1 puff by mouth daily.   nitroGLYCERIN 0.4 MG SL tablet Commonly known as: Nitrostat Place 1 tablet (0.4 mg total) under the tongue every 5 (five) minutes as needed for chest pain (MAX 3 TABLETS).   polyethylene glycol 17 g packet Commonly known as: MIRALAX / GLYCOLAX Take 17 g by mouth daily as needed for mild  constipation.   tamsulosin 0.4 MG Caps capsule Commonly known as: FLOMAX Take 1 capsule (0.4 mg total) by mouth daily after breakfast.   ZADITOR OP Place 2 drops into both eyes as needed (for itching).       Discharge Instructions: Please refer to Patient Instructions section of EMR for full details.  Patient was counseled important signs and symptoms that should prompt return to medical care, changes in medications, dietary instructions, activity restrictions, and follow up appointments.   Follow-Up Appointments: Patient/wife instructed to call urology for follow-up in 1 week Patient/wife instructed to call cardiologist for sooner appointment (within 2 weeks) Routine follow-up with PCP Future Appointments  Date Time Provider [place]  08/03/2020 11:45 AM Evans Lance, MD [facility]     Alcus Dad, MD 06/15/2020, 4:07 PM PGY-1, [place]

## 2020-06-15 NOTE — Progress Notes (Addendum)
   06/15/20 0111  Vitals  BP 111/82  MAP (mmHg) 91  BP Location Left Arm  BP Method Automatic  Patient Position (if appropriate) Lying  Pulse Rate 96  Pulse Rate Source Dinamap  Resp 18  Level of Consciousness  Level of Consciousness Alert  MEWS COLOR  MEWS Score Color Green  MEWS Score  MEWS Temp 0  MEWS Systolic 0  MEWS Pulse 0  MEWS RR 0  MEWS LOC 0  MEWS Score 0  Provider Notification  Provider Name/Title Dr. Ouida Sills  Date Provider Notified 06/15/20  Time Provider Notified 0123  Notification Type Call  Notification Reason Other (Comment) (HR- dipped down to the 30's, Bladder scan-491)  Response Other (Comment) (indwelling cath ordered.)  Date of Provider Response 06/15/20  Time of Provider Response 0123    Please SEE DR. Anderson's Note.

## 2020-06-15 NOTE — Progress Notes (Signed)
Interim progress note  Patient's a.m. bladder scan revealing 491 cc.  Patient was given attempts to try to urinate, however this was unsuccessful.  Instead of doing a third bladder scan with trial at 6 AM will instead go ahead and place Foley catheter.  Additionally, RN Rose raise concern for heart rate that dipped down to 36.  Patient asymptomatic, heart rate returned back up to normal.  Most recent vital signs stable.  We will continue to monitor, no changes at this time.  Milus Banister, [place], PGY-3 06/15/2020 1:29 AM

## 2020-06-15 NOTE — Progress Notes (Signed)
Family Medicine Teaching Service Daily Progress Note Intern Pager: [phone]  Patient name: Erik [name] Medical record number: [id number] Date of birth: January 13, 1941 Age: 80 y.o. Gender: male  Primary Care Provider: Alroy Dust, L.Marlou Sa, MD Consultants: None Code Status: FULL  Pt Overview and Major Events to Date:  1/22: admitted  Assessment and Plan:  Erik C Tysingeris a 80 y.o.malepresenting with abdominal pain and found to have urinary retention and bilateral hydronephrosis. PMH is significant forBPH, paroxysmal A. fib, CAD, hypertension, dementia,history of CVA.  Acute Urinary Retention with Bilateral Hydronephrosis 2/2 anticholinergic use and BPH. Hydronephrosis resolved on repeat renal u/s. Patient failed void trial yesterday, so foley was replaced. -Foley in place, appropriate UOP -Spoke with urology who recommended continuing foley and outpatient follow-up next week -Urology also recommended initiating Flomax -Strict Is/Os -Continue home finasteride  AKI Improving. Cr 1.79 this morning (from 5.81 on admission). Etiology thought to be post-obstructive due to urinary retention as above. -Management of urinary retention as above -Daily BMP -Avoid nephrotoxic agents  Paroxysmal A-Fib  Bradycardia Patient with a hx of paroxysmal a-fib. Home meds: Eliquis 5mg  BID and Flecainide 150mg  BID. While admitted, he has intermittently been bradycardic. EKG shows NSR with 1st degree AV block. He did have one episode of tachycardic to 120s yesterday which resolved spontaneously after a few minutes. -Continue home Eliquis and Flecainide -Will continue to monitor HR closely -Will obtain Flecainide level -Outpatient cardiology follow-up  HTN BP 111/82 overnight, but remains elevated this morning (most recent 188/97). -Continue amlodipine 5mg  daily -Home lisinopril was discontinued (due to AKI, no indication for ACE) -Monitor BP -Outpatient PCP f/u  Dementia with behavioral  disturbance Oriented only to person at baseline. Home meds include: Aricept and Namenda -Delirium precautions -Verbal re-direction for agitation -Home Aricept and Namenda were discontinued -PT/OT   HFpEF Last echo Feb 2019 showed EF 55-60% with grade II diastolic function  QTc Prolongation QTc 501 on EKG. -Avoid QT prolonging agents  COPD Chronic, stable. Home meds: Incruse Ellipta -Continue home incruse  FEN/GI: Heart healthy diet PPx: Eliquis   Status is: Inpatient Remains inpatient appropriate because:Ongoing diagnostic testing needed not appropriate for outpatient work up   Dispo: The patient is from: Home              Anticipated d/c is to: Home              Anticipated d/c date is: 1 day              Patient currently is not medically stable to d/c.   Difficult to place patient No   Subjective:  No acute events overnight. Patient denies complaints this morning.   Objective: Temp:  [98.2 F (36.8 C)-98.5 F (36.9 C)] 98.2 F (36.8 C) (01/25 0557) Pulse Rate:  [58-96] 83 (01/25 0557) Resp:  [17-18] 18 (01/25 0557) BP: (111-196)/(75-97) 188/97 (01/25 0557) SpO2:  [96 %-97 %] 97 % (01/25 0557) Physical Exam: General: alert, resting comfortably in bed, NAD Cardiovascular: RRR, normal S1/S2 Respiratory: normal WOB on room air Abdomen: soft, nontender, nondistended Extremities: no peripheral edema Neuro: alert, oriented to person, place and time  Laboratory: Recent Labs  Lab 06/12/20 0908 06/13/20 0132 06/14/20 1237  WBC 13.3* 10.6* 9.7  HGB 13.5 12.3* 12.8*  HCT 40.7 37.3* 36.9*  PLT 377 329 321   Recent Labs  Lab 06/12/20 0908 06/13/20 0132 06/13/20 1427 06/14/20 1237 06/15/20 0349  NA 133*   < > 140 142 142  K 4.5   < >  4.9 4.7 4.6  CL 98   < > 109 108 108  CO2 21*   < > 22 21* 24  BUN 74*   < > 57* 38* 26*  CREATININE 5.81*   < > 3.38* 2.25* 1.79*  CALCIUM 8.8*   < > 8.3* 8.5* 8.9  PROT 6.9  --   --   --   --   BILITOT 0.8  --   --    --   --   ALKPHOS 68  --   --   --   --   ALT 15  --   --   --   --   AST 14*  --   --   --   --   GLUCOSE 104*   < > 101* 100* 103*   < > = values in this interval not displayed.     Imaging/Diagnostic Tests: No new imaging in last 24h.   Alcus Dad, MD 06/15/2020, 6:09 AM PGY-1, [place] Intern pager: [phone], text pages welcome

## 2020-06-16 ENCOUNTER — Telehealth: Payer: Self-pay | Admitting: Internal Medicine

## 2020-06-16 ENCOUNTER — Telehealth: Payer: Self-pay | Admitting: Family Medicine

## 2020-06-16 MED ORDER — AMLODIPINE BESYLATE 5 MG PO TABS: 5.0000 mg | ORAL_TABLET | Freq: Every day | ORAL | 3 refills | Status: DC

## 2020-06-16 NOTE — Telephone Encounter (Signed)
Call placed to [facility].  Left detailed message.  Advised had sent refill of amlodipine to pharmacy.  Advised f/u appt made with RU for 06/30/20 at 11:45 am.  Advised to call scheduler if that appt will not work.  Gave direct number to scheduler.  Await further needs.

## 2020-06-16 NOTE — Telephone Encounter (Signed)
I called to check on the patient. His wife stated that he is doing ok with no concern.  I reminded her that he should now be on Norvasc 5 mg going forward for his HTN and to keep holding his Lisinopril pending his follow-up with his PCP. She verbalizes understanding.  She asked why his memory medications were stopped, i.e., Namenda and Aricept. I advised her that those medications can cause bradycardia and heart blood and might be contributing to the low HR readings he had in the hospital. I also informed her that the Flecainide may cay a similar effect, and he will need to f/u with his Cardiologist soon to adjust his Flecainide dose. Unfortunately, we still don't have the Felcainide level checked while in the hospital. She verbalized understanding and agreed with the plan. She said he has a urology appointment and a Cardiology appointment for next week.

## 2020-06-16 NOTE — Telephone Encounter (Signed)
Pt c/o medication issue:  1. Name of Medication: amLODipine (NORVASC) 5 MG tablet    2. How are you currently taking this medication (dosage and times per day)? As prescribed.  3. Are you having a reaction (difficulty breathing--STAT)? No.  4. What is your medication issue? Patients wife is calling in stating that this medication was prescribed to him while he was admitted to the hospital. She states he only has a month supply and would like to have her husband seen by Dr. Lovena Le sooner than his appointment on 08/03/2020. Please advise.

## 2020-06-17 ENCOUNTER — Telehealth: Payer: Self-pay | Admitting: Family Medicine

## 2020-06-17 LAB — CULTURE, BLOOD (ROUTINE X 2)
Culture: NO GROWTH
Culture: NO GROWTH
Special Requests: ADEQUATE

## 2020-06-17 LAB — FLECAINIDE LEVEL: Flecainide: 1.4 ug/mL — ABNORMAL HIGH (ref 0.20–1.00)

## 2020-06-17 NOTE — Telephone Encounter (Signed)
Flecainide level is above toxic level. Likely contributing to his Bradycardia in addition to the Namenda and Aricept.  I called and discussed with his wife. As I am not his cardiologist, I would prefer not to make an adjustment recommendation. I advised his wife to hold the dose for tonight and contact his cardiologist or PCP tomorrow morning for further recommendations regarding dose adjustment.  We held his Aricept and Namenda while he was in the hospital. They will discuss these two meds as well with his PCP.  I will forward this note and result to his PCP and Cardiology to follow up on it with the patient.

## 2020-06-18 ENCOUNTER — Telehealth: Payer: Self-pay | Admitting: Internal Medicine

## 2020-06-18 DIAGNOSIS — I480 Paroxysmal atrial fibrillation: Secondary | ICD-10-CM

## 2020-06-18 MED ORDER — FLECAINIDE ACETATE 100 MG PO TABS: 100.0000 mg | ORAL_TABLET | Freq: Two times a day (BID) | ORAL | 3 refills | Status: DC

## 2020-06-18 NOTE — Telephone Encounter (Signed)
Pt c/o medication issue:  1. Name of Medication: flecainide (TAMBOCOR) 150 MG tablet  2. How are you currently taking this medication (dosage and times per day)? As prescribed  3. Are you having a reaction (difficulty breathing--STAT)? No   4. What is your medication issue? Patient's spouse states the patient did not take his normal dosage of medication last night and she would like to make sure that it will be alright for him to continue taking it today. Please advise.

## 2020-06-18 NOTE — Telephone Encounter (Signed)
Message sent to Dr. Lovena Le Level is most likely not a trough. His bradycardia in the hospital could be explained due to higher flecainide levels since he was in AKI. Scr back down close to baseline. One dose was held. I have reached out to Dr. Lovena Le to see if he feels a dose decrease is needed.

## 2020-06-18 NOTE — Telephone Encounter (Addendum)
Patients spouse states that patient was told by Dr. Gwendlyn Deutscher to hold his Flecainide last night due to his Flecainide level being elevated to 1.40. Patient did take his normal 150 mg dose this morning but is concerned that his dose may be too high.   Referring to PharmD/Dr. Lovena Le for advisement.

## 2020-06-18 NOTE — Telephone Encounter (Signed)
Spoke with Dr. Lovena Le, will decrease flecainide dose to 100mg  BID and repeat trough in 1 week.  Called and spoke with wife. She will bring him in next Friday for lab work. Advised to bring him in early in AM before his flecainide dose or late in the afternoon. New Rx for flecainide 100mg  tablets sent to pharmacy.

## 2020-06-22 DIAGNOSIS — I10 Essential (primary) hypertension: Secondary | ICD-10-CM | POA: Diagnosis not present

## 2020-06-22 DIAGNOSIS — R339 Retention of urine, unspecified: Secondary | ICD-10-CM | POA: Diagnosis not present

## 2020-06-22 DIAGNOSIS — R350 Frequency of micturition: Secondary | ICD-10-CM | POA: Diagnosis not present

## 2020-06-22 DIAGNOSIS — R3914 Feeling of incomplete bladder emptying: Secondary | ICD-10-CM | POA: Diagnosis not present

## 2020-06-22 DIAGNOSIS — R5383 Other fatigue: Secondary | ICD-10-CM | POA: Diagnosis not present

## 2020-06-22 DIAGNOSIS — R3912 Poor urinary stream: Secondary | ICD-10-CM | POA: Diagnosis not present

## 2020-06-22 DIAGNOSIS — R413 Other amnesia: Secondary | ICD-10-CM | POA: Diagnosis not present

## 2020-06-24 DIAGNOSIS — R338 Other retention of urine: Secondary | ICD-10-CM | POA: Diagnosis not present

## 2020-06-25 ENCOUNTER — Other Ambulatory Visit: Payer: Medicare Other | Admitting: *Deleted

## 2020-06-25 ENCOUNTER — Other Ambulatory Visit: Payer: Self-pay

## 2020-06-25 DIAGNOSIS — I480 Paroxysmal atrial fibrillation: Secondary | ICD-10-CM

## 2020-06-27 NOTE — Progress Notes (Signed)
Cardiology Office Note Date:  06/27/2020  Patient ID:  Erik [name], DOB Aug 02, 1940, MRN [id number] PCP:  Asencion GowdaMitchell, L.August Saucerean, MD  Electrophysiologist: Dr. Ladona Ridgelaylor   Chief Complaint: post hospital  History of Present Illness: Erik [name] is a 80 y.o. male with history of PAFlutter ablated, PAFib,  Sinus node dysfunction, non-obstructive CAD by cath in 2012, HLD, HTN, CVA, TIA, peripheral neuropathy, dementia.  I saw him Jan 2019 The patient is accompanied by his wife. I read that he has started to have some memory issues and he was started on Aricept just under a year ago.  The patient describes moment of feeling lightheaded, not so much pressure or discomfort but a feeling like he needs to stop and grab something to collect himself, resolves quickly, no syncope.  His wife states his PMD did a brain MRI, labs, and abd US as well for some abdominal complaints all of which reportedly were normal.   He has years of DOE, this is ongoing and unchanged, though of late maybe the last year even worse especially with inclines and has avoided them.  I don't get that he is having CP, but he says it is especially hard to breath with inclines.  He c/o burning pain bottom of his feet and left 4/5th toes feel numb especially at night.  He denies any bleeding or signs of bleeding. He is not great at taking his medicines, will forget evenings a couple nights a week it sounds like.  His wife offers that he has started drinking again.  The patient admits 1 drink at night though I suspect more by his wife's body language. Suspected ETOH played role, urged cessation Noted CPX test in 2012 noting "a clear limitation to the exercise due to his chronotropic incompetence. Per technician, EKG showed possible development of an AV Block/Junctional rhythm" Start with a 2 week monitor since he reports a few episodes a week and an echo.  I discussed getting a stress test/ETT, but he is reluctant stating he has historically  has been unable to walk the treadmill.   Monitor noted no significant bradycardia or pauses,  TTE with preserved LVEF, mild LVH, grade II DD, high filling pressures  He last saw Dr. Ladona Ridgelaylor Jan 2021, mentioned that his flecainide was stopped with thoughts may have contributed to his neuropathy though did not improve resumed.  EKG wias stable, no changes were made.  Had a fleainide level drawn during his hospital visit that was elevated, this was communicated to the office, with RPH and Dr. Ladona Ridgelaylor, dose reduced to 100mg  BID and f/u trough planned.  06/25/20 flecainide level collected, not resulted  TODAY He comes today in a wheelchair accompanied by his wife. He had COVID back in December he was very weak with his illness and sufferrred a fall, was seen in the ER without any injuries and discharged, not yet back to where he had been. He had another  hospitalization admitted 06/12/20 with low abdominal discomfort and diarrhea. Found with urinary retention, AKI Discharged 06/15/20 His discharge summary reviewed mentioned SB on telemetry intermittently to 30's and 40's, his aricept and namenda stopped given could contribute to bradycardia, flecainide level drawn though not resulted at time of d/c cam back elevated at 1.4 and out patient his dose reduced to the current dose.  Peak Creat was 5.81 >> K+ and mag wnl EKGS from his hospital stay are reviewed and discussed below  Since his discharge he is getting home health and PT  will start this weekend He remains with foley, seeing urology weekly  No CP, palpitations or cardiac awareness, no SOB, no near syncope or syncope. No bleeding or signs of bleeding    Past Medical History:  Diagnosis Date  . Arthritis    both knees  . Atrial fibrillation (HCC)    Paroxysmal, s/p PVI 11/11/10  . Atrial flutter ([place])    s/p RFCA  by GT  . Basal cell carcinoma of ear   . CAD (coronary artery disease)    a. cath 5/12: pLAD 25%, D1 25%, EF 65 % (done 2/2  abnormal myoview with inferapical  ischemia)  . Dyslipidemia   . Dysrhythmia   . HTN (hypertension)   . Hx of echocardiogram    Echo (9/15):  Mild LVH, EF 55-60%, no RWMA, normal diast function, mild MR, mod LAE, mildly increased pulmonary pressures (PASP 30 mmHg).  . Hypertension   . Shortness of breath dyspnea    has a low heart rate  . Snoring    a. sleep study 11/2012 neg for OSA; increased leg movement suspicious for primary movement d/o (ie restless leg syndrome)   . Stroke [facility])    mild stroke with  second heart ablation  . TIA (transient ischemic attack)    11/12/10 post afib ablation    Past Surgical History:  Procedure Laterality Date  . atrial flutter ablation     by GT  . basal cell cancer resection    . CARDIOVERSION  10/31/2011   Procedure: CARDIOVERSION;  Surgeon: Jolaine Artist, MD;  Location: [facility] ENDOSCOPY;  Service: Cardiovascular;  Laterality: N/A;  . CATARACT EXTRACTION, BILATERAL    . COLONOSCOPY    . EYE SURGERY     growth under lid- x2  . HEMORRHOID SURGERY    . KNEE ARTHROSCOPY Bilateral    bilateral  . SHOULDER ARTHROSCOPY Right 08/04/2015   Procedure: ARTHROSCOPY RIGHT SHOULDER;  Surgeon: Frederik Pear, MD;  Location: [place];  Service: Orthopedics;  Laterality: Right;  . TEE WITHOUT CARDIOVERSION  10/31/2011   Procedure: TRANSESOPHAGEAL ECHOCARDIOGRAM (TEE);  Surgeon: Jolaine Artist, MD;  Location: [facility] ENDOSCOPY;  Service: Cardiovascular;  Laterality: N/A;    Current Outpatient Medications  Medication Sig Dispense Refill  . acetaminophen (TYLENOL) 500 MG tablet Take 500-1,000 mg by mouth every 6 (six) hours as needed (pain).    Marland Kitchen amLODipine (NORVASC) 5 MG tablet Take 1 tablet (5 mg total) by mouth daily. 90 tablet 3  . ELIQUIS 5 MG TABS tablet TAKE 1 TABLET BY MOUTH TWICE A DAY (Patient taking differently: Take 5 mg by mouth 2 (two) times daily.) 60 tablet 11  . FIBER PO Take 1 capsule by mouth daily.    . finasteride (PROSCAR) 5 MG tablet Take 5 mg  by mouth at bedtime.   12  . flecainide (TAMBOCOR) 100 MG tablet Take 1 tablet (100 mg total) by mouth 2 (two) times daily. 180 tablet 3  . INCRUSE ELLIPTA 62.5 MCG/INH AEPB Take 1 puff by mouth daily.  1  . Ketotifen Fumarate (ZADITOR OP) Place 2 drops into both eyes as needed (for itching).    . nitroGLYCERIN (NITROSTAT) 0.4 MG SL tablet Place 1 tablet (0.4 mg total) under the tongue every 5 (five) minutes as needed for chest pain (MAX 3 TABLETS). 25 tablet 11  . polyethylene glycol (MIRALAX / GLYCOLAX) 17 g packet Take 17 g by mouth daily as needed for mild constipation. 14 each 0  . tamsulosin (FLOMAX) 0.4  MG CAPS capsule Take 1 capsule (0.4 mg total) by mouth daily after breakfast. 14 capsule 0   No current facility-administered medications for this visit.   Facility-Administered Medications Ordered in Other Visits  Medication Dose Route Frequency Provider Last Rate Last Admin  . gadobenate dimeglumine (MULTIHANCE) injection 20 mL  20 mL Intravenous Once Radiologist, Medication, MD        Allergies:   Albuterol sulfate [albuterol], Adhesive [tape], and Warfarin and related   Social History:  The patient  reports that he quit smoking about 45 years ago. His smoking use included cigarettes. He has a 30.00 pack-year smoking history. He has never used smokeless tobacco. He reports that he does not drink alcohol and does not use drugs.   Family History:  The patient's family history includes Arthritis in his mother; Heart disease in his mother; Hypertension in his mother; Stroke in his mother.  ROS:  Please see the history of present illness.  All other systems are reviewed and otherwise negative.   PHYSICAL EXAM:  VS:  There were no vitals taken for this visit. BMI: There is no height or weight on file to calculate BMI. Well nourished, well developed, in no acute distress, chronically ill appearing HEENT: normocephalic, atraumatic  Neck: no JVD, carotid bruits or masses Cardiac:   RRR;  no significant murmurs, no rubs, or gallops Lungs:  CTA b/l, no wheezing, rhonchi or rales  Abd: soft, nontender MS: no deformity or atrophy Ext: no edema Skin: warm and dry, no rash Neuro:  No gross deficits appreciated Psych: euthymic mood, full affect    EKG:  Done today and reviewed by myself  SR bpm, 1st degree AVBlock, PR 284ms, QRS 104  06/12/20 SB 53bpm, 1st degree AVblock, PR 285ms, QRS 18ms            #2 is 100bpm, slightly irregular(read as AFib), IVCD, QRS 179bpm            #3 is SB 41bpm, 1st degree AVblock, PR 244ms, QRS 187ms 06/14/20 SRB 59bpm, 1st degree AVblock, PR 239ms, QRS 121ms    Feb 2019: monitoring 1. NSR with sinus bradycardia but no prolonged pauses 2. Frequent PAC's 3. No PVC's  4. No VT or SVt 5. No prolonged pauses   07/02/2017: TTE Study Conclusions  - Left ventricle: The cavity size was mildly dilated. Wall  thickness was increased in a pattern of mild LVH. Systolic  function was normal. The estimated ejection fraction was in the  range of 55% to 60%. Features are consistent with a pseudonormal  left ventricular filling pattern, with concomitant abnormal  relaxation and increased filling pressure (grade 2 diastolic  dysfunction). Doppler parameters are consistent with high  ventricular filling pressure.  - Aortic valve: There was trivial regurgitation.  - Mitral valve: Calcified annulus. Mildly thickened leaflets .  There was mild regurgitation.  - Left atrium: The atrium was mildly dilated.  - Pulmonary arteries: PA peak pressure: 35 mm Hg (S).   01/27/14: TTE Study Conclusions - Left ventricle: The cavity size was normal. Wall thickness was increased in a pattern of mild LVH. Systolic function was normal. The estimated ejection fraction was in the range of 55% to 60%. Wall motion was normal; there were no regional wall motion abnormalities. Left ventricular diastolic function parameters were normal. - Mitral  valve: There was mild regurgitation. - Left atrium: The atrium was moderately dilated. - Pulmonary arteries: Systolic pressure was mildly increased Impressions - Normal LV function; moderate LAE;  mild MR and TR; mildly elevated pulmonary pressures.  Recent Labs: 06/12/2020: ALT 15; Magnesium 2.1 06/14/2020: Hemoglobin 12.8; Platelets 321 06/15/2020: BUN 26; Creatinine, Ser 1.79; Potassium 4.6; Sodium 142  No results found for requested labs within last 8760 hours.   CrCl cannot be calculated (Unknown ideal weight.).   Wt Readings from Last 3 Encounters:  06/12/20 212 lb (96.2 kg)  05/21/20 211 lb (95.7 kg)  06/05/19 200 lb 9.6 oz (91 kg)     Other studies reviewed: Additional studies/records reviewed today include: summarized above  ASSESSMENT AND PLAN:  1. Paroxysmal Afib     CHA2DS2Vasc is 5, on Eliquis, appropriately dosed      *On Flecainide     In review of Dr. Tanna Furry notes, with sinus node dysfunction to avoid AV and sinus nodal blocking agents      Flecainide tox during acute illness/AKI/urinary retention hospitalization Dose has been reduced Intervals are back to baseline  Updated flecainide level is pending   2. HTN     No changes, looks OK  3. Functional decline     1st COVID, then urinary retention/AKI hospitalization     starting to improve, using walker at home, will start PT this weekend at home   4. ETOH     His wife reports he has not drank since Christmas       Disposition: f/u with Dr. Lovena Le next a month, his wife states they had his planned visit with him next month and very much want to keep it and see Dr. Lovena Le.  Current medicines are reviewed at length with the patient today.  The patient did not have any concerns regarding medicines.  Venetia Night, PA-C 06/27/2020 6:44 PM     [address] [place] [place] [id number] [phone] (office)  [phone] (fax)

## 2020-06-30 ENCOUNTER — Ambulatory Visit: Payer: Medicare Other | Admitting: Physician Assistant

## 2020-06-30 ENCOUNTER — Other Ambulatory Visit: Payer: Self-pay

## 2020-06-30 ENCOUNTER — Encounter: Payer: Self-pay | Admitting: Physician Assistant

## 2020-06-30 VITALS — BP 120/70 | HR 65 | Ht 71.0 in | Wt 171.0 lb

## 2020-06-30 DIAGNOSIS — I10 Essential (primary) hypertension: Secondary | ICD-10-CM

## 2020-06-30 DIAGNOSIS — I480 Paroxysmal atrial fibrillation: Secondary | ICD-10-CM

## 2020-06-30 DIAGNOSIS — Z79899 Other long term (current) drug therapy: Secondary | ICD-10-CM | POA: Diagnosis not present

## 2020-06-30 DIAGNOSIS — Z5181 Encounter for therapeutic drug level monitoring: Secondary | ICD-10-CM | POA: Diagnosis not present

## 2020-06-30 LAB — FLECAINIDE LEVEL: Flecainide: 0.89 ug/mL (ref 0.20–1.00)

## 2020-06-30 NOTE — Patient Instructions (Signed)
Medication Instructions:   Your physician recommends that you continue on your current medications as directed. Please refer to the Current Medication list given to you today.  *If you need a refill on your cardiac medications before your next appointment, please call your pharmacy*   Lab Work: [place]   If you have labs (blood work) drawn today and your tests are completely normal, you will receive your results only by: Marland Kitchen MyChart Message (if you have MyChart) OR . A paper copy in the mail If you have any lab test that is abnormal or we need to change your treatment, we will call you to review the results.   Testing/Procedures: NONE ORDERED  TODAY   Follow-Up: At [facility], you and your health needs are our priority.  As part of our continuing mission to provide you with exceptional heart care, we have created designated Provider Care Teams.  These Care Teams include your primary Cardiologist (physician) and Advanced Practice Providers (APPs -  Physician Assistants and Nurse Practitioners) who all work together to provide you with the care you need, when you need it.  We recommend signing up for the patient portal called "MyChart".  Sign up information is provided on this After Visit Summary.  MyChart is used to connect with patients for Virtual Visits (Telemedicine).  Patients are able to view lab/test results, encounter notes, upcoming appointments, etc.  Non-urgent messages can be sent to your provider as well.   To learn more about what you can do with MyChart, go to [email or web address].    Your next appointment:   1-2  month(s) (original appointment cancelled 08-03-2020)  The format for your next appointment:   In Person  Provider:   Cristopher Peru, MD   Other Instructions

## 2020-07-01 DIAGNOSIS — R338 Other retention of urine: Secondary | ICD-10-CM | POA: Diagnosis not present

## 2020-07-02 DIAGNOSIS — R338 Other retention of urine: Secondary | ICD-10-CM | POA: Diagnosis not present

## 2020-07-02 DIAGNOSIS — R310 Gross hematuria: Secondary | ICD-10-CM | POA: Diagnosis not present

## 2020-07-06 DIAGNOSIS — I480 Paroxysmal atrial fibrillation: Secondary | ICD-10-CM | POA: Diagnosis not present

## 2020-07-06 DIAGNOSIS — I10 Essential (primary) hypertension: Secondary | ICD-10-CM | POA: Diagnosis not present

## 2020-07-06 DIAGNOSIS — H35033 Hypertensive retinopathy, bilateral: Secondary | ICD-10-CM | POA: Diagnosis not present

## 2020-07-06 DIAGNOSIS — Z961 Presence of intraocular lens: Secondary | ICD-10-CM | POA: Diagnosis not present

## 2020-07-06 DIAGNOSIS — R339 Retention of urine, unspecified: Secondary | ICD-10-CM | POA: Diagnosis not present

## 2020-07-06 DIAGNOSIS — Z9181 History of falling: Secondary | ICD-10-CM | POA: Diagnosis not present

## 2020-07-06 DIAGNOSIS — Z87891 Personal history of nicotine dependence: Secondary | ICD-10-CM | POA: Diagnosis not present

## 2020-07-06 DIAGNOSIS — E7800 Pure hypercholesterolemia, unspecified: Secondary | ICD-10-CM | POA: Diagnosis not present

## 2020-07-06 DIAGNOSIS — Z7901 Long term (current) use of anticoagulants: Secondary | ICD-10-CM | POA: Diagnosis not present

## 2020-07-06 DIAGNOSIS — H3530 Unspecified macular degeneration: Secondary | ICD-10-CM | POA: Diagnosis not present

## 2020-07-06 DIAGNOSIS — Z8673 Personal history of transient ischemic attack (TIA), and cerebral infarction without residual deficits: Secondary | ICD-10-CM | POA: Diagnosis not present

## 2020-07-06 DIAGNOSIS — H35363 Drusen (degenerative) of macula, bilateral: Secondary | ICD-10-CM | POA: Diagnosis not present

## 2020-07-06 DIAGNOSIS — Z8616 Personal history of COVID-19: Secondary | ICD-10-CM | POA: Diagnosis not present

## 2020-07-06 DIAGNOSIS — K219 Gastro-esophageal reflux disease without esophagitis: Secondary | ICD-10-CM | POA: Diagnosis not present

## 2020-07-06 DIAGNOSIS — Z960 Presence of urogenital implants: Secondary | ICD-10-CM | POA: Diagnosis not present

## 2020-07-12 ENCOUNTER — Other Ambulatory Visit: Payer: Self-pay | Admitting: Family Medicine

## 2020-07-14 DIAGNOSIS — Z8673 Personal history of transient ischemic attack (TIA), and cerebral infarction without residual deficits: Secondary | ICD-10-CM | POA: Diagnosis not present

## 2020-07-14 DIAGNOSIS — R339 Retention of urine, unspecified: Secondary | ICD-10-CM | POA: Diagnosis not present

## 2020-07-14 DIAGNOSIS — I480 Paroxysmal atrial fibrillation: Secondary | ICD-10-CM | POA: Diagnosis not present

## 2020-07-14 DIAGNOSIS — Z8616 Personal history of COVID-19: Secondary | ICD-10-CM | POA: Diagnosis not present

## 2020-07-14 DIAGNOSIS — H3530 Unspecified macular degeneration: Secondary | ICD-10-CM | POA: Diagnosis not present

## 2020-07-14 DIAGNOSIS — Z87891 Personal history of nicotine dependence: Secondary | ICD-10-CM | POA: Diagnosis not present

## 2020-07-14 DIAGNOSIS — E7800 Pure hypercholesterolemia, unspecified: Secondary | ICD-10-CM | POA: Diagnosis not present

## 2020-07-14 DIAGNOSIS — I10 Essential (primary) hypertension: Secondary | ICD-10-CM | POA: Diagnosis not present

## 2020-07-14 DIAGNOSIS — Z9181 History of falling: Secondary | ICD-10-CM | POA: Diagnosis not present

## 2020-07-14 DIAGNOSIS — K219 Gastro-esophageal reflux disease without esophagitis: Secondary | ICD-10-CM | POA: Diagnosis not present

## 2020-07-14 DIAGNOSIS — Z960 Presence of urogenital implants: Secondary | ICD-10-CM | POA: Diagnosis not present

## 2020-07-14 DIAGNOSIS — Z7901 Long term (current) use of anticoagulants: Secondary | ICD-10-CM | POA: Diagnosis not present

## 2020-07-16 DIAGNOSIS — E7800 Pure hypercholesterolemia, unspecified: Secondary | ICD-10-CM | POA: Diagnosis not present

## 2020-07-16 DIAGNOSIS — H3530 Unspecified macular degeneration: Secondary | ICD-10-CM | POA: Diagnosis not present

## 2020-07-16 DIAGNOSIS — Z8616 Personal history of COVID-19: Secondary | ICD-10-CM | POA: Diagnosis not present

## 2020-07-16 DIAGNOSIS — Z9181 History of falling: Secondary | ICD-10-CM | POA: Diagnosis not present

## 2020-07-16 DIAGNOSIS — I10 Essential (primary) hypertension: Secondary | ICD-10-CM | POA: Diagnosis not present

## 2020-07-16 DIAGNOSIS — I480 Paroxysmal atrial fibrillation: Secondary | ICD-10-CM | POA: Diagnosis not present

## 2020-07-16 DIAGNOSIS — Z8673 Personal history of transient ischemic attack (TIA), and cerebral infarction without residual deficits: Secondary | ICD-10-CM | POA: Diagnosis not present

## 2020-07-16 DIAGNOSIS — Z87891 Personal history of nicotine dependence: Secondary | ICD-10-CM | POA: Diagnosis not present

## 2020-07-16 DIAGNOSIS — Z7901 Long term (current) use of anticoagulants: Secondary | ICD-10-CM | POA: Diagnosis not present

## 2020-07-16 DIAGNOSIS — Z960 Presence of urogenital implants: Secondary | ICD-10-CM | POA: Diagnosis not present

## 2020-07-16 DIAGNOSIS — K219 Gastro-esophageal reflux disease without esophagitis: Secondary | ICD-10-CM | POA: Diagnosis not present

## 2020-07-16 DIAGNOSIS — R339 Retention of urine, unspecified: Secondary | ICD-10-CM | POA: Diagnosis not present

## 2020-07-19 DIAGNOSIS — I10 Essential (primary) hypertension: Secondary | ICD-10-CM | POA: Diagnosis not present

## 2020-07-19 DIAGNOSIS — Z8673 Personal history of transient ischemic attack (TIA), and cerebral infarction without residual deficits: Secondary | ICD-10-CM | POA: Diagnosis not present

## 2020-07-19 DIAGNOSIS — R339 Retention of urine, unspecified: Secondary | ICD-10-CM | POA: Diagnosis not present

## 2020-07-19 DIAGNOSIS — Z8616 Personal history of COVID-19: Secondary | ICD-10-CM | POA: Diagnosis not present

## 2020-07-19 DIAGNOSIS — Z7901 Long term (current) use of anticoagulants: Secondary | ICD-10-CM | POA: Diagnosis not present

## 2020-07-19 DIAGNOSIS — E7800 Pure hypercholesterolemia, unspecified: Secondary | ICD-10-CM | POA: Diagnosis not present

## 2020-07-19 DIAGNOSIS — Z87891 Personal history of nicotine dependence: Secondary | ICD-10-CM | POA: Diagnosis not present

## 2020-07-19 DIAGNOSIS — Z9181 History of falling: Secondary | ICD-10-CM | POA: Diagnosis not present

## 2020-07-19 DIAGNOSIS — H3530 Unspecified macular degeneration: Secondary | ICD-10-CM | POA: Diagnosis not present

## 2020-07-19 DIAGNOSIS — I480 Paroxysmal atrial fibrillation: Secondary | ICD-10-CM | POA: Diagnosis not present

## 2020-07-19 DIAGNOSIS — K219 Gastro-esophageal reflux disease without esophagitis: Secondary | ICD-10-CM | POA: Diagnosis not present

## 2020-07-19 DIAGNOSIS — Z960 Presence of urogenital implants: Secondary | ICD-10-CM | POA: Diagnosis not present

## 2020-07-20 DIAGNOSIS — R338 Other retention of urine: Secondary | ICD-10-CM | POA: Diagnosis not present

## 2020-07-21 DIAGNOSIS — Z9181 History of falling: Secondary | ICD-10-CM | POA: Diagnosis not present

## 2020-07-21 DIAGNOSIS — Z87891 Personal history of nicotine dependence: Secondary | ICD-10-CM | POA: Diagnosis not present

## 2020-07-21 DIAGNOSIS — Z960 Presence of urogenital implants: Secondary | ICD-10-CM | POA: Diagnosis not present

## 2020-07-21 DIAGNOSIS — H3530 Unspecified macular degeneration: Secondary | ICD-10-CM | POA: Diagnosis not present

## 2020-07-21 DIAGNOSIS — Z8673 Personal history of transient ischemic attack (TIA), and cerebral infarction without residual deficits: Secondary | ICD-10-CM | POA: Diagnosis not present

## 2020-07-21 DIAGNOSIS — I10 Essential (primary) hypertension: Secondary | ICD-10-CM | POA: Diagnosis not present

## 2020-07-21 DIAGNOSIS — E7800 Pure hypercholesterolemia, unspecified: Secondary | ICD-10-CM | POA: Diagnosis not present

## 2020-07-21 DIAGNOSIS — I480 Paroxysmal atrial fibrillation: Secondary | ICD-10-CM | POA: Diagnosis not present

## 2020-07-21 DIAGNOSIS — Z8616 Personal history of COVID-19: Secondary | ICD-10-CM | POA: Diagnosis not present

## 2020-07-21 DIAGNOSIS — R339 Retention of urine, unspecified: Secondary | ICD-10-CM | POA: Diagnosis not present

## 2020-07-21 DIAGNOSIS — Z7901 Long term (current) use of anticoagulants: Secondary | ICD-10-CM | POA: Diagnosis not present

## 2020-07-21 DIAGNOSIS — K219 Gastro-esophageal reflux disease without esophagitis: Secondary | ICD-10-CM | POA: Diagnosis not present

## 2020-07-28 DIAGNOSIS — I10 Essential (primary) hypertension: Secondary | ICD-10-CM | POA: Diagnosis not present

## 2020-07-28 DIAGNOSIS — H3530 Unspecified macular degeneration: Secondary | ICD-10-CM | POA: Diagnosis not present

## 2020-07-28 DIAGNOSIS — Z7901 Long term (current) use of anticoagulants: Secondary | ICD-10-CM | POA: Diagnosis not present

## 2020-07-28 DIAGNOSIS — Z87891 Personal history of nicotine dependence: Secondary | ICD-10-CM | POA: Diagnosis not present

## 2020-07-28 DIAGNOSIS — Z8673 Personal history of transient ischemic attack (TIA), and cerebral infarction without residual deficits: Secondary | ICD-10-CM | POA: Diagnosis not present

## 2020-07-28 DIAGNOSIS — K219 Gastro-esophageal reflux disease without esophagitis: Secondary | ICD-10-CM | POA: Diagnosis not present

## 2020-07-28 DIAGNOSIS — Z9181 History of falling: Secondary | ICD-10-CM | POA: Diagnosis not present

## 2020-07-28 DIAGNOSIS — E7800 Pure hypercholesterolemia, unspecified: Secondary | ICD-10-CM | POA: Diagnosis not present

## 2020-07-28 DIAGNOSIS — I480 Paroxysmal atrial fibrillation: Secondary | ICD-10-CM | POA: Diagnosis not present

## 2020-07-28 DIAGNOSIS — Z960 Presence of urogenital implants: Secondary | ICD-10-CM | POA: Diagnosis not present

## 2020-07-28 DIAGNOSIS — Z8616 Personal history of COVID-19: Secondary | ICD-10-CM | POA: Diagnosis not present

## 2020-07-28 DIAGNOSIS — R339 Retention of urine, unspecified: Secondary | ICD-10-CM | POA: Diagnosis not present

## 2020-07-30 DIAGNOSIS — R339 Retention of urine, unspecified: Secondary | ICD-10-CM | POA: Diagnosis not present

## 2020-07-30 DIAGNOSIS — Z87891 Personal history of nicotine dependence: Secondary | ICD-10-CM | POA: Diagnosis not present

## 2020-07-30 DIAGNOSIS — H3530 Unspecified macular degeneration: Secondary | ICD-10-CM | POA: Diagnosis not present

## 2020-07-30 DIAGNOSIS — Z7901 Long term (current) use of anticoagulants: Secondary | ICD-10-CM | POA: Diagnosis not present

## 2020-07-30 DIAGNOSIS — E7800 Pure hypercholesterolemia, unspecified: Secondary | ICD-10-CM | POA: Diagnosis not present

## 2020-07-30 DIAGNOSIS — Z8616 Personal history of COVID-19: Secondary | ICD-10-CM | POA: Diagnosis not present

## 2020-07-30 DIAGNOSIS — K219 Gastro-esophageal reflux disease without esophagitis: Secondary | ICD-10-CM | POA: Diagnosis not present

## 2020-07-30 DIAGNOSIS — Z960 Presence of urogenital implants: Secondary | ICD-10-CM | POA: Diagnosis not present

## 2020-07-30 DIAGNOSIS — Z9181 History of falling: Secondary | ICD-10-CM | POA: Diagnosis not present

## 2020-07-30 DIAGNOSIS — Z8673 Personal history of transient ischemic attack (TIA), and cerebral infarction without residual deficits: Secondary | ICD-10-CM | POA: Diagnosis not present

## 2020-07-30 DIAGNOSIS — I480 Paroxysmal atrial fibrillation: Secondary | ICD-10-CM | POA: Diagnosis not present

## 2020-07-30 DIAGNOSIS — I10 Essential (primary) hypertension: Secondary | ICD-10-CM | POA: Diagnosis not present

## 2020-08-02 DIAGNOSIS — I480 Paroxysmal atrial fibrillation: Secondary | ICD-10-CM | POA: Diagnosis not present

## 2020-08-02 DIAGNOSIS — K219 Gastro-esophageal reflux disease without esophagitis: Secondary | ICD-10-CM | POA: Diagnosis not present

## 2020-08-02 DIAGNOSIS — Z9181 History of falling: Secondary | ICD-10-CM | POA: Diagnosis not present

## 2020-08-02 DIAGNOSIS — E7800 Pure hypercholesterolemia, unspecified: Secondary | ICD-10-CM | POA: Diagnosis not present

## 2020-08-02 DIAGNOSIS — Z7901 Long term (current) use of anticoagulants: Secondary | ICD-10-CM | POA: Diagnosis not present

## 2020-08-02 DIAGNOSIS — I10 Essential (primary) hypertension: Secondary | ICD-10-CM | POA: Diagnosis not present

## 2020-08-02 DIAGNOSIS — Z960 Presence of urogenital implants: Secondary | ICD-10-CM | POA: Diagnosis not present

## 2020-08-02 DIAGNOSIS — H3530 Unspecified macular degeneration: Secondary | ICD-10-CM | POA: Diagnosis not present

## 2020-08-02 DIAGNOSIS — Z8616 Personal history of COVID-19: Secondary | ICD-10-CM | POA: Diagnosis not present

## 2020-08-02 DIAGNOSIS — Z8673 Personal history of transient ischemic attack (TIA), and cerebral infarction without residual deficits: Secondary | ICD-10-CM | POA: Diagnosis not present

## 2020-08-02 DIAGNOSIS — R339 Retention of urine, unspecified: Secondary | ICD-10-CM | POA: Diagnosis not present

## 2020-08-02 DIAGNOSIS — Z87891 Personal history of nicotine dependence: Secondary | ICD-10-CM | POA: Diagnosis not present

## 2020-08-03 ENCOUNTER — Ambulatory Visit: Payer: Medicare Other | Admitting: Internal Medicine

## 2020-08-03 ENCOUNTER — Encounter: Payer: Self-pay | Admitting: Internal Medicine

## 2020-08-03 ENCOUNTER — Other Ambulatory Visit: Payer: Self-pay

## 2020-08-03 VITALS — BP 136/70 | HR 57 | Ht 71.0 in | Wt 204.6 lb

## 2020-08-03 DIAGNOSIS — R3914 Feeling of incomplete bladder emptying: Secondary | ICD-10-CM | POA: Diagnosis not present

## 2020-08-03 DIAGNOSIS — I480 Paroxysmal atrial fibrillation: Secondary | ICD-10-CM

## 2020-08-03 DIAGNOSIS — I10 Essential (primary) hypertension: Secondary | ICD-10-CM | POA: Diagnosis not present

## 2020-08-03 DIAGNOSIS — R350 Frequency of micturition: Secondary | ICD-10-CM | POA: Diagnosis not present

## 2020-08-03 DIAGNOSIS — R001 Bradycardia, unspecified: Secondary | ICD-10-CM | POA: Diagnosis not present

## 2020-08-03 NOTE — Patient Instructions (Addendum)
Medication Instructions:  Your physician recommends that you continue on your current medications as directed. Please refer to the Current Medication list given to you today.  Labwork: None ordered.  Testing/Procedures: None ordered.  Follow-Up: Your physician wants you to follow-up in: 6 months with Cristopher Peru, MD or one of the following Advanced Practice Providers on your designated Care Team:    Chanetta Marshall, NP  Tommye Standard, PA-C  Legrand Como "Jonni Sanger" Chalmers Cater, [place]   Any Other Special Instructions Will Be Listed Below (If Applicable).  If you need a refill on your cardiac medications before your next appointment, please call your pharmacy.

## 2020-08-03 NOTE — Progress Notes (Signed)
HPI Mr. Bielefeld returns today for followup. He is a pleasant 80 yo man with a h/o ETOH abuse, atrial flutter s/p ablation, PAF controlled on flecainide, dementia and COPD. He was hospitalized over a month ago with acute renal failure due to urinary obstruction, and had to have an indwelling foley catheter. He had his dose of flecainide reduced to 100 bid. He has done well in the interim, with no symptomatic atrial fib and his urinary retention has resolved.  Allergies  Allergen Reactions  . Albuterol Sulfate [Albuterol] Other (See Comments)    rapid heartbeat; afib  . Adhesive [Tape] Rash and Other (See Comments)    New heart monitor/patch (adhesive) broke out the skin and left a large red patch (still visible after 3 weeks  . Warfarin And Related Other (See Comments)    Patient's INR could never get regulated while on this     Current Outpatient Medications  Medication Sig Dispense Refill  . acetaminophen (TYLENOL) 500 MG tablet Take 500-1,000 mg by mouth every 6 (six) hours as needed (pain).    Marland Kitchen amLODipine (NORVASC) 5 MG tablet Take 1 tablet (5 mg total) by mouth daily. 90 tablet 3  . donepezil (ARICEPT) 10 MG tablet Take 1 tablet by mouth at bedtime.    Marland Kitchen ELIQUIS 5 MG TABS tablet TAKE 1 TABLET BY MOUTH TWICE A DAY 60 tablet 11  . FIBER PO Take 1 capsule by mouth daily.    . finasteride (PROSCAR) 5 MG tablet Take 5 mg by mouth at bedtime.   12  . flecainide (TAMBOCOR) 100 MG tablet Take 1 tablet (100 mg total) by mouth 2 (two) times daily. 180 tablet 3  . INCRUSE ELLIPTA 62.5 MCG/INH AEPB Take 1 puff by mouth daily.  1  . Ketotifen Fumarate (ZADITOR OP) Place 2 drops into both eyes as needed (for itching).    . memantine (NAMENDA) 5 MG tablet Take 5 mg by mouth 2 (two) times daily.    . nitroGLYCERIN (NITROSTAT) 0.4 MG SL tablet Place 1 tablet (0.4 mg total) under the tongue every 5 (five) minutes as needed for chest pain (MAX 3 TABLETS). 25 tablet 11  . polyethylene glycol  (MIRALAX / GLYCOLAX) 17 g packet Take 17 g by mouth daily as needed for mild constipation. 14 each 0  . tamsulosin (FLOMAX) 0.4 MG CAPS capsule Take 1 capsule (0.4 mg total) by mouth daily after breakfast. 14 capsule 0   No current facility-administered medications for this visit.   Facility-Administered Medications Ordered in Other Visits  Medication Dose Route Frequency Provider Last Rate Last Admin  . gadobenate dimeglumine (MULTIHANCE) injection 20 mL  20 mL Intravenous Once Radiologist, Medication, MD         Past Medical History:  Diagnosis Date  . Arthritis    both knees  . Atrial fibrillation (HCC)    Paroxysmal, s/p PVI 11/11/10  . Atrial flutter ([place])    s/p RFCA  by GT  . Basal cell carcinoma of ear   . CAD (coronary artery disease)    a. cath 5/12: pLAD 25%, D1 25%, EF 65 % (done 2/2 abnormal myoview with inferapical  ischemia)  . Dyslipidemia   . Dysrhythmia   . HTN (hypertension)   . Hx of echocardiogram    Echo (9/15):  Mild LVH, EF 55-60%, no RWMA, normal diast function, mild MR, mod LAE, mildly increased pulmonary pressures (PASP 30 mmHg).  . Hypertension   . Shortness of breath  dyspnea    has a low heart rate  . Snoring    a. sleep study 11/2012 neg for OSA; increased leg movement suspicious for primary movement d/o (ie restless leg syndrome)   . Stroke [facility])    mild stroke with  second heart ablation  . TIA (transient ischemic attack)    11/12/10 post afib ablation    ROS:   All systems reviewed and negative except as noted in the HPI.   Past Surgical History:  Procedure Laterality Date  . atrial flutter ablation     by GT  . basal cell cancer resection    . CARDIOVERSION  10/31/2011   Procedure: CARDIOVERSION;  Surgeon: Jolaine Artist, MD;  Location: [facility] ENDOSCOPY;  Service: Cardiovascular;  Laterality: N/A;  . CATARACT EXTRACTION, BILATERAL    . COLONOSCOPY    . EYE SURGERY     growth under lid- x2  . HEMORRHOID SURGERY    . KNEE ARTHROSCOPY  Bilateral    bilateral  . SHOULDER ARTHROSCOPY Right 08/04/2015   Procedure: ARTHROSCOPY RIGHT SHOULDER;  Surgeon: Frederik Pear, MD;  Location: [place];  Service: Orthopedics;  Laterality: Right;  . TEE WITHOUT CARDIOVERSION  10/31/2011   Procedure: TRANSESOPHAGEAL ECHOCARDIOGRAM (TEE);  Surgeon: Jolaine Artist, MD;  Location: [facility] ENDOSCOPY;  Service: Cardiovascular;  Laterality: N/A;     Family History  Problem Relation Age of Onset  . Hypertension Mother   . Stroke Mother   . Heart disease Mother   . Arthritis Mother   . Heart attack Neg Hx      Social History   Socioeconomic History  . Marital status: Divorced    Spouse name: Not on file  . Number of children: 1  . Years of education: Not on file  . Highest education level: Not on file  Occupational History  . 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 Smoking status: Former Smoker    Packs/day: 1.50    Years: 20.00    Pack years: 30.00    Types: Cigarettes    Quit date: 05/23/1975    Years since quitting: 45.2  . Smokeless tobacco: Never Used  Vaping Use  . Vaping Use: Never used  Substance and Sexual Activity  . Alcohol use: No    Alcohol/week: 14.0 standard drinks    Types: 14 Standard drinks or equivalent per week    Comment: has not had a drink in 9 months  . Drug use: No  . Sexual activity: Not on file  Other Topics Concern  . Not on file  Social History Narrative  . Not on file   Social Determinants of Health   Financial Resource Strain: Not on file  Food Insecurity: Not on file  Transportation Needs: Not on file  Physical Activity: Not on file  Stress: Not on file  Social Connections: Not on file  Intimate Partner Violence: Not on file     BP 136/70   Pulse (!) 57   Ht 5\' 11"  (1.803 m)   Wt 204 lb 9.6 oz (92.8 kg)   SpO2 95%   BMI 28.54 kg/m   Physical Exam:  Well appearing NAD HEENT: Unremarkable Neck:  No JVD, no  thyromegally Lymphatics:  No adenopathy Back:  No CVA tenderness Lungs:  Clear with no wheezes HEART:  Regular rate rhythm, no murmurs, no rubs, no clicks Abd:  soft, positive bowel sounds, no organomegally, no  rebound, no guarding Ext:  2 plus pulses, 2+ edema, no cyanosis, no clubbing Skin:  No rashes no nodules Neuro:  CN II through XII intact, motor grossly intact  EKG - sinus bradycardia  Assess/Plan: 1. PAF - he appears to be maintaining NSR on a lower dose of flecainide. Continue.  2. HTN - his bp is failry well controlled. We discussed switching norvasc but will hold for now. 3. Peripheral edema - he admits to sodium indiscretion and I asked him to eat less salt. I considered stopping norvasc but will continue for now. He will keep his legs elevated. 4. Sinus node dysfunction - he is off of all sinus node slowing drugs except for flecainide. We will follow. 5. ETOH abuse - in remission. I encouraged him to abstain.  Carleene Overlie Taylor,MD

## 2020-08-04 DIAGNOSIS — R339 Retention of urine, unspecified: Secondary | ICD-10-CM | POA: Diagnosis not present

## 2020-08-04 DIAGNOSIS — E7800 Pure hypercholesterolemia, unspecified: Secondary | ICD-10-CM | POA: Diagnosis not present

## 2020-08-04 DIAGNOSIS — Z9181 History of falling: Secondary | ICD-10-CM | POA: Diagnosis not present

## 2020-08-04 DIAGNOSIS — Z7901 Long term (current) use of anticoagulants: Secondary | ICD-10-CM | POA: Diagnosis not present

## 2020-08-04 DIAGNOSIS — I480 Paroxysmal atrial fibrillation: Secondary | ICD-10-CM | POA: Diagnosis not present

## 2020-08-04 DIAGNOSIS — Z8673 Personal history of transient ischemic attack (TIA), and cerebral infarction without residual deficits: Secondary | ICD-10-CM | POA: Diagnosis not present

## 2020-08-04 DIAGNOSIS — Z960 Presence of urogenital implants: Secondary | ICD-10-CM | POA: Diagnosis not present

## 2020-08-04 DIAGNOSIS — I10 Essential (primary) hypertension: Secondary | ICD-10-CM | POA: Diagnosis not present

## 2020-08-04 DIAGNOSIS — K219 Gastro-esophageal reflux disease without esophagitis: Secondary | ICD-10-CM | POA: Diagnosis not present

## 2020-08-04 DIAGNOSIS — Z87891 Personal history of nicotine dependence: Secondary | ICD-10-CM | POA: Diagnosis not present

## 2020-08-04 DIAGNOSIS — H3530 Unspecified macular degeneration: Secondary | ICD-10-CM | POA: Diagnosis not present

## 2020-08-04 DIAGNOSIS — Z8616 Personal history of COVID-19: Secondary | ICD-10-CM | POA: Diagnosis not present

## 2020-08-05 DIAGNOSIS — E7800 Pure hypercholesterolemia, unspecified: Secondary | ICD-10-CM | POA: Diagnosis not present

## 2020-08-05 DIAGNOSIS — J449 Chronic obstructive pulmonary disease, unspecified: Secondary | ICD-10-CM | POA: Diagnosis not present

## 2020-08-05 DIAGNOSIS — R413 Other amnesia: Secondary | ICD-10-CM | POA: Diagnosis not present

## 2020-08-05 DIAGNOSIS — I10 Essential (primary) hypertension: Secondary | ICD-10-CM | POA: Diagnosis not present

## 2020-08-09 DIAGNOSIS — I480 Paroxysmal atrial fibrillation: Secondary | ICD-10-CM | POA: Diagnosis not present

## 2020-08-09 DIAGNOSIS — H3530 Unspecified macular degeneration: Secondary | ICD-10-CM | POA: Diagnosis not present

## 2020-08-09 DIAGNOSIS — R339 Retention of urine, unspecified: Secondary | ICD-10-CM | POA: Diagnosis not present

## 2020-08-09 DIAGNOSIS — I10 Essential (primary) hypertension: Secondary | ICD-10-CM | POA: Diagnosis not present

## 2020-08-09 DIAGNOSIS — Z8616 Personal history of COVID-19: Secondary | ICD-10-CM | POA: Diagnosis not present

## 2020-08-09 DIAGNOSIS — Z960 Presence of urogenital implants: Secondary | ICD-10-CM | POA: Diagnosis not present

## 2020-08-09 DIAGNOSIS — Z7901 Long term (current) use of anticoagulants: Secondary | ICD-10-CM | POA: Diagnosis not present

## 2020-08-09 DIAGNOSIS — E7800 Pure hypercholesterolemia, unspecified: Secondary | ICD-10-CM | POA: Diagnosis not present

## 2020-08-09 DIAGNOSIS — Z9181 History of falling: Secondary | ICD-10-CM | POA: Diagnosis not present

## 2020-08-09 DIAGNOSIS — Z8673 Personal history of transient ischemic attack (TIA), and cerebral infarction without residual deficits: Secondary | ICD-10-CM | POA: Diagnosis not present

## 2020-08-09 DIAGNOSIS — K219 Gastro-esophageal reflux disease without esophagitis: Secondary | ICD-10-CM | POA: Diagnosis not present

## 2020-08-09 DIAGNOSIS — Z87891 Personal history of nicotine dependence: Secondary | ICD-10-CM | POA: Diagnosis not present

## 2020-08-16 DIAGNOSIS — Z9181 History of falling: Secondary | ICD-10-CM | POA: Diagnosis not present

## 2020-08-16 DIAGNOSIS — Z8616 Personal history of COVID-19: Secondary | ICD-10-CM | POA: Diagnosis not present

## 2020-08-16 DIAGNOSIS — K219 Gastro-esophageal reflux disease without esophagitis: Secondary | ICD-10-CM | POA: Diagnosis not present

## 2020-08-16 DIAGNOSIS — I480 Paroxysmal atrial fibrillation: Secondary | ICD-10-CM | POA: Diagnosis not present

## 2020-08-16 DIAGNOSIS — E7800 Pure hypercholesterolemia, unspecified: Secondary | ICD-10-CM | POA: Diagnosis not present

## 2020-08-16 DIAGNOSIS — R339 Retention of urine, unspecified: Secondary | ICD-10-CM | POA: Diagnosis not present

## 2020-08-16 DIAGNOSIS — H3530 Unspecified macular degeneration: Secondary | ICD-10-CM | POA: Diagnosis not present

## 2020-08-16 DIAGNOSIS — Z8673 Personal history of transient ischemic attack (TIA), and cerebral infarction without residual deficits: Secondary | ICD-10-CM | POA: Diagnosis not present

## 2020-08-16 DIAGNOSIS — Z7901 Long term (current) use of anticoagulants: Secondary | ICD-10-CM | POA: Diagnosis not present

## 2020-08-16 DIAGNOSIS — I10 Essential (primary) hypertension: Secondary | ICD-10-CM | POA: Diagnosis not present

## 2020-08-16 DIAGNOSIS — Z87891 Personal history of nicotine dependence: Secondary | ICD-10-CM | POA: Diagnosis not present

## 2020-08-16 DIAGNOSIS — Z960 Presence of urogenital implants: Secondary | ICD-10-CM | POA: Diagnosis not present

## 2020-08-18 ENCOUNTER — Ambulatory Visit: Payer: Medicare Other | Attending: Internal Medicine

## 2020-08-18 ENCOUNTER — Other Ambulatory Visit (HOSPITAL_COMMUNITY): Payer: Self-pay | Admitting: Internal Medicine

## 2020-08-18 DIAGNOSIS — Z23 Encounter for immunization: Secondary | ICD-10-CM

## 2020-08-18 NOTE — Progress Notes (Signed)
   Covid-19 Vaccination Clinic  Name:  [name]    MRN: [id number] DOB: 06-07-1940  08/18/2020  [name] was observed post Covid-19 immunization for 15 minutes without incident. He was provided with Vaccine Information Sheet and instruction to access the V-Safe system.   [name] was instructed to call 911 with any severe reactions post vaccine: Marland Kitchen Difficulty breathing  . Swelling of face and throat  . A fast heartbeat  . A bad rash all over body  . Dizziness and weakness   Immunizations Administered    Name Date Dose VIS Date Route   PFIZER Comrnaty(Gray TOP) Covid-19 Vaccine 08/18/2020 11:40 AM 0.3 mL 04/29/2020 Intramuscular   Manufacturer: [employer]   Lot: [id number]   [place]: [phone]

## 2020-08-23 DIAGNOSIS — E7800 Pure hypercholesterolemia, unspecified: Secondary | ICD-10-CM | POA: Diagnosis not present

## 2020-08-23 DIAGNOSIS — Z8616 Personal history of COVID-19: Secondary | ICD-10-CM | POA: Diagnosis not present

## 2020-08-23 DIAGNOSIS — R339 Retention of urine, unspecified: Secondary | ICD-10-CM | POA: Diagnosis not present

## 2020-08-23 DIAGNOSIS — H3530 Unspecified macular degeneration: Secondary | ICD-10-CM | POA: Diagnosis not present

## 2020-08-23 DIAGNOSIS — Z960 Presence of urogenital implants: Secondary | ICD-10-CM | POA: Diagnosis not present

## 2020-08-23 DIAGNOSIS — Z9181 History of falling: Secondary | ICD-10-CM | POA: Diagnosis not present

## 2020-08-23 DIAGNOSIS — K219 Gastro-esophageal reflux disease without esophagitis: Secondary | ICD-10-CM | POA: Diagnosis not present

## 2020-08-23 DIAGNOSIS — Z87891 Personal history of nicotine dependence: Secondary | ICD-10-CM | POA: Diagnosis not present

## 2020-08-23 DIAGNOSIS — Z8673 Personal history of transient ischemic attack (TIA), and cerebral infarction without residual deficits: Secondary | ICD-10-CM | POA: Diagnosis not present

## 2020-08-23 DIAGNOSIS — I10 Essential (primary) hypertension: Secondary | ICD-10-CM | POA: Diagnosis not present

## 2020-08-23 DIAGNOSIS — Z7901 Long term (current) use of anticoagulants: Secondary | ICD-10-CM | POA: Diagnosis not present

## 2020-08-23 DIAGNOSIS — I480 Paroxysmal atrial fibrillation: Secondary | ICD-10-CM | POA: Diagnosis not present

## 2020-08-30 DIAGNOSIS — K219 Gastro-esophageal reflux disease without esophagitis: Secondary | ICD-10-CM | POA: Diagnosis not present

## 2020-08-30 DIAGNOSIS — Z7901 Long term (current) use of anticoagulants: Secondary | ICD-10-CM | POA: Diagnosis not present

## 2020-08-30 DIAGNOSIS — I480 Paroxysmal atrial fibrillation: Secondary | ICD-10-CM | POA: Diagnosis not present

## 2020-08-30 DIAGNOSIS — H3530 Unspecified macular degeneration: Secondary | ICD-10-CM | POA: Diagnosis not present

## 2020-08-30 DIAGNOSIS — Z9181 History of falling: Secondary | ICD-10-CM | POA: Diagnosis not present

## 2020-08-30 DIAGNOSIS — Z8673 Personal history of transient ischemic attack (TIA), and cerebral infarction without residual deficits: Secondary | ICD-10-CM | POA: Diagnosis not present

## 2020-08-30 DIAGNOSIS — Z960 Presence of urogenital implants: Secondary | ICD-10-CM | POA: Diagnosis not present

## 2020-08-30 DIAGNOSIS — I10 Essential (primary) hypertension: Secondary | ICD-10-CM | POA: Diagnosis not present

## 2020-08-30 DIAGNOSIS — Z87891 Personal history of nicotine dependence: Secondary | ICD-10-CM | POA: Diagnosis not present

## 2020-08-30 DIAGNOSIS — E7800 Pure hypercholesterolemia, unspecified: Secondary | ICD-10-CM | POA: Diagnosis not present

## 2020-08-30 DIAGNOSIS — R339 Retention of urine, unspecified: Secondary | ICD-10-CM | POA: Diagnosis not present

## 2020-08-30 DIAGNOSIS — Z8616 Personal history of COVID-19: Secondary | ICD-10-CM | POA: Diagnosis not present

## 2020-09-08 ENCOUNTER — Other Ambulatory Visit: Payer: Self-pay | Admitting: Internal Medicine

## 2020-09-16 DIAGNOSIS — L570 Actinic keratosis: Secondary | ICD-10-CM | POA: Diagnosis not present

## 2020-09-16 DIAGNOSIS — D225 Melanocytic nevi of trunk: Secondary | ICD-10-CM | POA: Diagnosis not present

## 2020-09-16 DIAGNOSIS — D2271 Melanocytic nevi of right lower limb, including hip: Secondary | ICD-10-CM | POA: Diagnosis not present

## 2020-09-16 DIAGNOSIS — L814 Other melanin hyperpigmentation: Secondary | ICD-10-CM | POA: Diagnosis not present

## 2020-09-16 DIAGNOSIS — L821 Other seborrheic keratosis: Secondary | ICD-10-CM | POA: Diagnosis not present

## 2020-09-16 DIAGNOSIS — D1801 Hemangioma of skin and subcutaneous tissue: Secondary | ICD-10-CM | POA: Diagnosis not present

## 2020-09-16 DIAGNOSIS — D2262 Melanocytic nevi of left upper limb, including shoulder: Secondary | ICD-10-CM | POA: Diagnosis not present

## 2020-09-16 DIAGNOSIS — D2272 Melanocytic nevi of left lower limb, including hip: Secondary | ICD-10-CM | POA: Diagnosis not present

## 2020-09-16 DIAGNOSIS — Z85828 Personal history of other malignant neoplasm of skin: Secondary | ICD-10-CM | POA: Diagnosis not present

## 2020-12-06 ENCOUNTER — Other Ambulatory Visit: Payer: Self-pay

## 2020-12-06 ENCOUNTER — Ambulatory Visit (HOSPITAL_COMMUNITY)
Admission: RE | Admit: 2020-12-06 | Discharge: 2020-12-06 | Disposition: A | Discharge status: Home / Self Care | Payer: Medicare Other | Source: Ambulatory Visit | Attending: Physician Assistant | Admitting: Physician Assistant

## 2020-12-06 ENCOUNTER — Encounter (HOSPITAL_COMMUNITY): Payer: Self-pay | Admitting: Physician Assistant

## 2020-12-06 VITALS — BP 90/60 | HR 104 | Ht 71.0 in | Wt 206.6 lb

## 2020-12-06 DIAGNOSIS — Z79899 Other long term (current) drug therapy: Secondary | ICD-10-CM | POA: Diagnosis not present

## 2020-12-06 DIAGNOSIS — I480 Paroxysmal atrial fibrillation: Secondary | ICD-10-CM | POA: Diagnosis not present

## 2020-12-06 DIAGNOSIS — I10 Essential (primary) hypertension: Secondary | ICD-10-CM | POA: Insufficient documentation

## 2020-12-06 DIAGNOSIS — Z8673 Personal history of transient ischemic attack (TIA), and cerebral infarction without residual deficits: Secondary | ICD-10-CM | POA: Insufficient documentation

## 2020-12-06 DIAGNOSIS — Z87891 Personal history of nicotine dependence: Secondary | ICD-10-CM | POA: Insufficient documentation

## 2020-12-06 DIAGNOSIS — Z7901 Long term (current) use of anticoagulants: Secondary | ICD-10-CM | POA: Diagnosis not present

## 2020-12-06 DIAGNOSIS — Z8249 Family history of ischemic heart disease and other diseases of the circulatory system: Secondary | ICD-10-CM | POA: Insufficient documentation

## 2020-12-06 DIAGNOSIS — D6869 Other thrombophilia: Secondary | ICD-10-CM | POA: Diagnosis not present

## 2020-12-06 DIAGNOSIS — I4892 Unspecified atrial flutter: Secondary | ICD-10-CM | POA: Insufficient documentation

## 2020-12-06 LAB — BASIC METABOLIC PANEL
Anion gap: 10 (ref 5–15)
BUN: 19 mg/dL (ref 8–23)
CO2: 22 mmol/L (ref 22–32)
Calcium: 9.5 mg/dL (ref 8.9–10.3)
Chloride: 106 mmol/L (ref 98–111)
Creatinine, Ser: 1.31 mg/dL — ABNORMAL HIGH (ref 0.61–1.24)
GFR, Estimated: 55 mL/min — ABNORMAL LOW (ref >60)
Glucose, Bld: 107 mg/dL — ABNORMAL HIGH (ref 70–99)
Potassium: 4.2 mmol/L (ref 3.5–5.1)
Sodium: 138 mmol/L (ref 135–145)

## 2020-12-06 LAB — CBC
HCT: 47.2 % (ref 39.0–52.0)
Hemoglobin: 15.2 g/dL (ref 13.0–17.0)
MCH: 31.9 pg (ref 26.0–34.0)
MCHC: 32.2 g/dL (ref 30.0–36.0)
MCV: 99.2 fL (ref 80.0–100.0)
Platelets: 261 10*3/uL (ref 150–400)
RBC: 4.76 MIL/uL (ref 4.22–5.81)
RDW: 11.9 % (ref 11.5–15.5)
WBC: 10.2 10*3/uL (ref 4.0–10.5)
nRBC: 0 % (ref 0.0–0.2)

## 2020-12-06 NOTE — Progress Notes (Signed)
Primary Care Physician: Alroy Dust, L.Marlou Sa, MD Primary Electrophysiologist: Dr Lovena Le  Referring Physician: Dr Blanchard Mane is a 80 y.o. male with a history of atrial flutter, CAD, HLD, HTN, CVA, sinus node dysfunction, dementia, atrial fibrillation who presents for follow up in the [facility]. He is s/p atrial flutter ablation in 2010 and afib ablation in 2012. He has been maintained on flecainide since. Patient is on Eliquis for a CHADS2VASC score of 6. Patient reports that last night he started to feel much more fatigued and had dizziness on standing. There did not appear to be any specific triggers. He states he has not had any afib for years. He did fall getting out of the car for his visit today. He denies any head trauma, no bleeding. He did miss a dose of anticoagulation on 12/04/20.   Today, he denies symptoms of palpitations, chest pain, shortness of breath, orthopnea, PND, lower extremity edema, presyncope, syncope, snoring, daytime somnolence, bleeding, or neurologic sequela. The patient is tolerating medications without difficulties and is otherwise without complaint today.    Atrial Fibrillation Risk Factors:  he does not have symptoms or diagnosis of sleep apnea. he does not have a history of rheumatic fever. he does not have a history of alcohol use.   he has a BMI of Body mass index is 28.81 kg/m.Marland Kitchen Filed Weights   12/06/20 0904  Weight: 93.7 kg    Family History  Problem Relation Age of Onset   Hypertension Mother    Stroke Mother    Heart disease Mother    Arthritis Mother    Heart attack Neg Hx      Atrial Fibrillation Management history:  Previous antiarrhythmic drugs: flecainide Previous cardioversions: 2013 Previous ablations: flutter 2010, afib 2012 CHADS2VASC score: 6 Anticoagulation history: Eliquis   Past Medical History:  Diagnosis Date   Arthritis    both knees   Atrial fibrillation (HCC)     Paroxysmal, s/p PVI 11/11/10   Atrial flutter (HCC)    s/p RFCA  by GT   Basal cell carcinoma of ear    CAD (coronary artery disease)    a. cath 5/12: pLAD 25%, D1 25%, EF 65 % (done 2/2 abnormal myoview with inferapical  ischemia)   Dyslipidemia    Dysrhythmia    HTN (hypertension)    Hx of echocardiogram    Echo (9/15):  Mild LVH, EF 55-60%, no RWMA, normal diast function, mild MR, mod LAE, mildly increased pulmonary pressures (PASP 30 mmHg).   Hypertension    Shortness of breath dyspnea    has a low heart rate   Snoring    a. sleep study 11/2012 neg for OSA; increased leg movement suspicious for primary movement d/o (ie restless leg syndrome)    Stroke (HCC)    mild stroke with  second heart ablation   TIA (transient ischemic attack)    11/12/10 post afib ablation   Past Surgical History:  Procedure Laterality Date   atrial flutter ablation     by GT   basal cell cancer resection     CARDIOVERSION  10/31/2011   Procedure: CARDIOVERSION;  Surgeon: Jolaine Artist, MD;  Location: [place];  Service: Cardiovascular;  Laterality: N/A;   CATARACT EXTRACTION, BILATERAL     COLONOSCOPY     EYE SURGERY     growth under lid- x2   HEMORRHOID SURGERY     KNEE ARTHROSCOPY Bilateral    bilateral  SHOULDER ARTHROSCOPY Right 08/04/2015   Procedure: ARTHROSCOPY RIGHT SHOULDER;  Surgeon: Frederik Pear, MD;  Location: [place];  Service: Orthopedics;  Laterality: Right;   TEE WITHOUT CARDIOVERSION  10/31/2011   Procedure: TRANSESOPHAGEAL ECHOCARDIOGRAM (TEE);  Surgeon: Jolaine Artist, MD;  Location: [facility] ENDOSCOPY;  Service: Cardiovascular;  Laterality: N/A;    Current Outpatient Medications  Medication Sig Dispense Refill   acetaminophen (TYLENOL) 500 MG tablet Take 500-1,000 mg by mouth every 6 (six) hours as needed (pain).     amLODipine (NORVASC) 5 MG tablet Take 1 tablet (5 mg total) by mouth daily. 90 tablet 3   COVID-19 mRNA Vac-TriS, Pfizer, SUSP injection USE AS DIRECTED .3 mL 0    donepezil (ARICEPT) 10 MG tablet Take 1 tablet by mouth at bedtime.     ELIQUIS 5 MG TABS tablet TAKE 1 TABLET BY MOUTH TWICE A DAY 60 tablet 11   FIBER PO Take 1 capsule by mouth daily.     finasteride (PROSCAR) 5 MG tablet Take 5 mg by mouth at bedtime.   12   flecainide (TAMBOCOR) 100 MG tablet Take 1 tablet (100 mg total) by mouth 2 (two) times daily. 180 tablet 3   INCRUSE ELLIPTA 62.5 MCG/INH AEPB Take 1 puff by mouth daily.  1   memantine (NAMENDA) 5 MG tablet Take 5 mg by mouth 2 (two) times daily.     nitroGLYCERIN (NITROSTAT) 0.4 MG SL tablet Place 1 tablet (0.4 mg total) under the tongue every 5 (five) minutes as needed for chest pain (MAX 3 TABLETS). 25 tablet 11   polyethylene glycol (MIRALAX / GLYCOLAX) 17 g packet Take 17 g by mouth daily as needed for mild constipation. 14 each 0   tamsulosin (FLOMAX) 0.4 MG CAPS capsule Take 1 capsule (0.4 mg total) by mouth daily after breakfast. 14 capsule 0   No current facility-administered medications for this encounter.   Facility-Administered Medications Ordered in Other Encounters  Medication Dose Route Frequency Provider Last Rate Last Admin   gadobenate dimeglumine (MULTIHANCE) injection 20 mL  20 mL Intravenous Once Radiologist, Medication, MD        Allergies  Allergen Reactions   Albuterol Sulfate [Albuterol] Other (See Comments)    rapid heartbeat; afib   Hyoscyamine Sulfate     Other reaction(s): urinary retention   Adhesive [Tape] Rash and Other (See Comments)    New heart monitor/patch (adhesive) broke out the skin and left a large red patch (still visible after 3 weeks   Warfarin And Related Other (See Comments)    Patient's INR could never get regulated while on this    Social History   Socioeconomic History   Marital status: Divorced    Spouse name: Not on file   Number of children: 1   Years of education: Not on file   Highest education level: Not on file  Occupational History   Occupation: Tax adviser for  [employer]    Employer: RETIRED    Comment: Retired  Tobacco Use   Smoking status: Former    Packs/day: 1.50    Years: 20.00    Pack years: 30.00    Types: Cigarettes    Quit date: 05/23/1975    Years since quitting: 45.5   Smokeless tobacco: Never  Vaping Use   Vaping Use: Never used  Substance and Sexual Activity   Alcohol use: No    Alcohol/week: 14.0 standard drinks    Types: 14 Standard drinks or equivalent per week  Comment: has not had a drink in 9 months   Drug use: No   Sexual activity: Not on file  Other Topics Concern   Not on file  Social History Narrative   Not on file   Social Determinants of Health   Financial Resource Strain: Not on file  Food Insecurity: Not on file  Transportation Needs: Not on file  Physical Activity: Not on file  Stress: Not on file  Social Connections: Not on file  Intimate Partner Violence: Not on file     ROS- All systems are reviewed and negative except as per the HPI above.  Physical Exam: Vitals:   12/06/20 0904  BP: 90/60  Pulse: (!) 104  Weight: 93.7 kg  Height: 5\' 11"  (1.803 m)    GEN- The patient is a well appearing elderly male, alert and oriented x 3 today.   Head- normocephalic, atraumatic Eyes-  Sclera clear, conjunctiva pink Ears- hearing intact Oropharynx- clear Neck- supple  Lungs- Clear to ausculation bilaterally, normal work of breathing Heart- irregular rate and rhythm, no murmurs, rubs or gallops  GI- soft, NT, ND, + BS Extremities- no clubbing, cyanosis, or edema MS- no significant deformity or atrophy Skin- no rash or lesion Psych- euthymic mood, full affect Neuro- strength and sensation are intact Musculoskeletal- no tenderness along spine  Wt Readings from Last 3 Encounters:  12/06/20 93.7 kg  08/03/20 92.8 kg  06/30/20 77.6 kg    EKG today demonstrates  Afib  Vent. rate 104 BPM PR interval * ms QRS duration 104 ms QT/QTcB 384/504 ms  Echo 07/02/17 demonstrated  -  Left ventricle: The cavity size was mildly dilated. Wall    thickness was increased in a pattern of mild LVH. Systolic    function was normal. The estimated ejection fraction was in the    range of 55% to 60%. Features are consistent with a pseudonormal    left ventricular filling pattern, with concomitant abnormal    relaxation and increased filling pressure (grade 2 diastolic    dysfunction). Doppler parameters are consistent with high    ventricular filling pressure.  - Aortic valve: There was trivial regurgitation.  - Mitral valve: Calcified annulus. Mildly thickened leaflets .    There was mild regurgitation.  - Left atrium: The atrium was mildly dilated.  - Pulmonary arteries: PA peak pressure: 35 mm Hg (S).   Epic records are reviewed at length today  CHA2DS2-VASc Score = 6  The patient's score is based upon: CHF History: No HTN History: Yes Diabetes History: No Stroke History: Yes Vascular Disease History: Yes Age Score: 2 Gender Score: 0     ASSESSMENT AND PLAN: 1. Paroxysmal Atrial Fibrillation (ICD10:  I48.0) The patient's CHA2DS2-VASc score is 6, indicating a 9.7% annual risk of stroke.   Patient in symptomatic afib. We discussed therapeutic options. Will plan for TEE guided DCCV given his missed dose of Eliquis on 7/16. Check bmet/cbc Continue flecainide 100 mg BID. Not on AV nodal agents per Dr Lovena Le due to sinus node dysfunction.  Continue Eliquis 5 mg BID  2. Secondary Hypercoagulable State (ICD10:  D68.69) The patient is at significant risk for stroke/thromboembolism based upon his CHA2DS2-VASc Score of 6.  Continue Apixaban (Eliquis).   3. HTN Low today. Will hold amlodipine for now.   Follow up in the AF clinic post DCCV.   [facility] [address] [place], [place] [address] [phone] 12/06/2020 9:12 AM

## 2020-12-06 NOTE — Patient Instructions (Signed)
Cardioversion scheduled for Wednesday, July 20th  - Arrive at the [facility] and go to admitting at [facility] not eat or drink anything after midnight the night prior to your procedure.  - Take all your morning medication (except diabetic medications) with a sip of water prior to arrival.  - You will not be able to drive home after your procedure.  - Do NOT miss any doses of your blood thinner - if you should miss a dose please notify our office immediately.  - If you feel as if you go back into normal rhythm prior to scheduled cardioversion, please notify our office immediately. If your procedure is canceled in the cardioversion suite you will be charged a cancellation fee.  Patients will be asked to: to mask in public and hand hygiene (no longer quarantine) in the 3 days prior to surgery, to report if any COVID-19-like illness or household contacts to COVID-19 to determine need for testing      HOLD amlodipine until follow up --- increase water intake to help with low blood pressure.

## 2020-12-07 ENCOUNTER — Ambulatory Visit (HOSPITAL_COMMUNITY)
Admission: RE | Admit: 2020-12-07 | Discharge: 2020-12-07 | Disposition: A | Discharge status: Home / Self Care | Payer: Medicare Other | Source: Ambulatory Visit | Attending: Physician Assistant | Admitting: Physician Assistant

## 2020-12-07 DIAGNOSIS — D6869 Other thrombophilia: Secondary | ICD-10-CM | POA: Diagnosis not present

## 2020-12-07 DIAGNOSIS — I480 Paroxysmal atrial fibrillation: Secondary | ICD-10-CM | POA: Insufficient documentation

## 2020-12-07 NOTE — Progress Notes (Signed)
Patient returns to clinic today for ECG. He felt he converted to SR this morning. ECG confirms SR HR 58, 1st degree AV block PR 236, QRS 104, Qtc 463. His systolic BP at home today was 119, will continue to hold amlodipine for now. His back is sore but no point tenderness and no ecchymosis. F/u with Dr Lovena Le as scheduled.

## 2020-12-08 ENCOUNTER — Encounter (HOSPITAL_COMMUNITY): Admission: RE | Payer: Self-pay | Source: Home / Self Care

## 2020-12-08 ENCOUNTER — Ambulatory Visit (HOSPITAL_COMMUNITY): Admission: RE | Admit: 2020-12-08 | Payer: Medicare Other | Source: Home / Self Care | Admitting: Cardiology

## 2020-12-08 SURGERY — ECHOCARDIOGRAM, TRANSESOPHAGEAL
Anesthesia: Monitor Anesthesia Care

## 2020-12-15 ENCOUNTER — Ambulatory Visit (HOSPITAL_COMMUNITY): Payer: Medicare Other | Admitting: Physician Assistant

## 2020-12-22 ENCOUNTER — Ambulatory Visit (HOSPITAL_COMMUNITY)
Admission: RE | Admit: 2020-12-22 | Discharge: 2020-12-22 | Disposition: A | Discharge status: Home / Self Care | Payer: Medicare Other | Source: Ambulatory Visit | Attending: Physician Assistant | Admitting: Physician Assistant

## 2020-12-22 ENCOUNTER — Other Ambulatory Visit: Payer: Self-pay

## 2020-12-22 VITALS — BP 136/74 | HR 55 | Ht 71.0 in | Wt 207.0 lb

## 2020-12-22 DIAGNOSIS — D6869 Other thrombophilia: Secondary | ICD-10-CM | POA: Diagnosis not present

## 2020-12-22 DIAGNOSIS — I10 Essential (primary) hypertension: Secondary | ICD-10-CM | POA: Diagnosis not present

## 2020-12-22 DIAGNOSIS — Z888 Allergy status to other drugs, medicaments and biological substances status: Secondary | ICD-10-CM | POA: Insufficient documentation

## 2020-12-22 DIAGNOSIS — Z79899 Other long term (current) drug therapy: Secondary | ICD-10-CM | POA: Insufficient documentation

## 2020-12-22 DIAGNOSIS — E785 Hyperlipidemia, unspecified: Secondary | ICD-10-CM | POA: Insufficient documentation

## 2020-12-22 DIAGNOSIS — Z8249 Family history of ischemic heart disease and other diseases of the circulatory system: Secondary | ICD-10-CM | POA: Insufficient documentation

## 2020-12-22 DIAGNOSIS — I2510 Atherosclerotic heart disease of native coronary artery without angina pectoris: Secondary | ICD-10-CM | POA: Insufficient documentation

## 2020-12-22 DIAGNOSIS — Z7901 Long term (current) use of anticoagulants: Secondary | ICD-10-CM | POA: Insufficient documentation

## 2020-12-22 DIAGNOSIS — Z87891 Personal history of nicotine dependence: Secondary | ICD-10-CM | POA: Diagnosis not present

## 2020-12-22 DIAGNOSIS — I480 Paroxysmal atrial fibrillation: Secondary | ICD-10-CM | POA: Insufficient documentation

## 2020-12-22 DIAGNOSIS — Z8673 Personal history of transient ischemic attack (TIA), and cerebral infarction without residual deficits: Secondary | ICD-10-CM | POA: Diagnosis not present

## 2020-12-22 NOTE — Progress Notes (Signed)
Primary Care Physician: Alroy Dust, L.Marlou Sa, MD Primary Electrophysiologist: Dr Lovena Le  Referring Physician: Dr Blanchard Mane is a 80 y.o. male with a history of atrial flutter, CAD, HLD, HTN, CVA, sinus node dysfunction, dementia, atrial fibrillation who presents for follow up in the [facility]. He is s/p atrial flutter ablation in 2010 and afib ablation in 2012. He has been maintained on flecainide since. Patient is on Eliquis for a CHADS2VASC score of 6. Patient reports that on 12/05/20 he started to feel much more fatigued and had dizziness on standing. There did not appear to be any specific triggers. He states he has not had any afib for years. He was in afib with heart rates ~100 bpm. He was scheduled for DCCV but converted back to SR on 12/07/20.   On follow up today, patient reports that he felt he was in afib 12/20/20 with more fatigue and dizziness. His heart rate was elevated on his home BP machine. Patient reports that he slept most of the day yesterday but is feeling better today. He is in SR today. There were no triggers that he could identify.   Today, he denies symptoms of chest pain, shortness of breath, orthopnea, PND, lower extremity edema, presyncope, syncope, snoring, daytime somnolence, bleeding, or neurologic sequela. The patient is tolerating medications without difficulties and is otherwise without complaint today.    Atrial Fibrillation Risk Factors:  he does not have symptoms or diagnosis of sleep apnea. he does not have a history of rheumatic fever. he does not have a history of alcohol use.   he has a BMI of Body mass index is 28.87 kg/m.Marland Kitchen Filed Weights   12/22/20 1138  Weight: 93.9 kg     Family History  Problem Relation Age of Onset   Hypertension Mother    Stroke Mother    Heart disease Mother    Arthritis Mother    Heart attack Neg Hx      Atrial Fibrillation Management history:  Previous antiarrhythmic  drugs: flecainide Previous cardioversions: 2013 Previous ablations: flutter 2010, afib 2012 CHADS2VASC score: 6 Anticoagulation history: Eliquis   Past Medical History:  Diagnosis Date   Arthritis    both knees   Atrial fibrillation (HCC)    Paroxysmal, s/p PVI 11/11/10   Atrial flutter (HCC)    s/p RFCA  by GT   Basal cell carcinoma of ear    CAD (coronary artery disease)    a. cath 5/12: pLAD 25%, D1 25%, EF 65 % (done 2/2 abnormal myoview with inferapical  ischemia)   Dyslipidemia    Dysrhythmia    HTN (hypertension)    Hx of echocardiogram    Echo (9/15):  Mild LVH, EF 55-60%, no RWMA, normal diast function, mild MR, mod LAE, mildly increased pulmonary pressures (PASP 30 mmHg).   Hypertension    Shortness of breath dyspnea    has a low heart rate   Snoring    a. sleep study 11/2012 neg for OSA; increased leg movement suspicious for primary movement d/o (ie restless leg syndrome)    Stroke (HCC)    mild stroke with  second heart ablation   TIA (transient ischemic attack)    11/12/10 post afib ablation   Past Surgical History:  Procedure Laterality Date   atrial flutter ablation     by GT   basal cell cancer resection     CARDIOVERSION  10/31/2011   Procedure: CARDIOVERSION;  Surgeon: Shaune Pascal  Bensimhon, MD;  Location: [place];  Service: Cardiovascular;  Laterality: N/A;   CATARACT EXTRACTION, BILATERAL     COLONOSCOPY     EYE SURGERY     growth under lid- x2   HEMORRHOID SURGERY     KNEE ARTHROSCOPY Bilateral    bilateral   SHOULDER ARTHROSCOPY Right 08/04/2015   Procedure: ARTHROSCOPY RIGHT SHOULDER;  Surgeon: Frederik Pear, MD;  Location: [place];  Service: Orthopedics;  Laterality: Right;   TEE WITHOUT CARDIOVERSION  10/31/2011   Procedure: TRANSESOPHAGEAL ECHOCARDIOGRAM (TEE);  Surgeon: Jolaine Artist, MD;  Location: [facility] ENDOSCOPY;  Service: Cardiovascular;  Laterality: N/A;    Current Outpatient Medications  Medication Sig Dispense Refill   acetaminophen  (TYLENOL) 500 MG tablet Take 500-1,000 mg by mouth every 6 (six) hours as needed (pain).     Carboxymethylcellulose Sodium (THERATEARS) 0.25 % SOLN Place 1 drop into both eyes daily as needed (dry eyes).     COVID-19 mRNA Vac-TriS, Pfizer, SUSP injection USE AS DIRECTED .3 mL 0   donepezil (ARICEPT) 10 MG tablet Take 10 mg by mouth at bedtime.     ELIQUIS 5 MG TABS tablet TAKE 1 TABLET BY MOUTH TWICE A DAY (Patient taking differently: Take 5 mg by mouth 2 (two) times daily.) 60 tablet 11   FIBER PO Take 1 capsule by mouth daily.     finasteride (PROSCAR) 5 MG tablet Take 5 mg by mouth at bedtime.   12   flecainide (TAMBOCOR) 100 MG tablet Take 1 tablet (100 mg total) by mouth 2 (two) times daily. 180 tablet 3   INCRUSE ELLIPTA 62.5 MCG/INH AEPB Take 1 puff by mouth daily.  1   memantine (NAMENDA) 5 MG tablet Take 5 mg by mouth 2 (two) times daily.     Multiple Vitamins-Minerals (PRESERVISION AREDS 2+MULTI VIT PO) Take 1 capsule by mouth in the morning and at bedtime.     polyethylene glycol (MIRALAX / GLYCOLAX) 17 g packet Take 17 g by mouth daily as needed for mild constipation. 14 each 0   tamsulosin (FLOMAX) 0.4 MG CAPS capsule Take 1 capsule (0.4 mg total) by mouth daily after breakfast. 14 capsule 0   amLODipine (NORVASC) 5 MG tablet Take 1 tablet (5 mg total) by mouth daily. (Patient not taking: Reported on 12/22/2020) 90 tablet 3   nitroGLYCERIN (NITROSTAT) 0.4 MG SL tablet Place 1 tablet (0.4 mg total) under the tongue every 5 (five) minutes as needed for chest pain (MAX 3 TABLETS). (Patient not taking: Reported on 12/22/2020) 25 tablet 11   No current facility-administered medications for this encounter.   Facility-Administered Medications Ordered in Other Encounters  Medication Dose Route Frequency Provider Last Rate Last Admin   gadobenate dimeglumine (MULTIHANCE) injection 20 mL  20 mL Intravenous Once Radiologist, Medication, MD        Allergies  Allergen Reactions   Albuterol  Sulfate [Albuterol] Other (See Comments)    rapid heartbeat; afib   Hyoscyamine Sulfate     Other reaction(s): urinary retention   Adhesive [Tape] Rash and Other (See Comments)    New heart monitor/patch (adhesive) broke out the skin and left a large red patch (still visible after 3 weeks   Warfarin And Related Other (See Comments)    Patient's INR could never get regulated while on this    Social History   Socioeconomic History   Marital status: Divorced    Spouse name: Not on file   Number of children: 1   Years of  education: Not on file   Highest education level: Not on file  Occupational History   Occupation: Tax adviser for [employer]    Employer: RETIRED    Comment: Retired  Tobacco Use   Smoking status: Former    Packs/day: 1.50    Years: 20.00    Pack years: 30.00    Types: Cigarettes    Quit date: 05/23/1975    Years since quitting: 45.6   Smokeless tobacco: Never  Vaping Use   Vaping Use: Never used  Substance and Sexual Activity   Alcohol use: No    Alcohol/week: 14.0 standard drinks    Types: 14 Standard drinks or equivalent per week    Comment: has not had a drink in 9 months   Drug use: No   Sexual activity: Not on file  Other Topics Concern   Not on file  Social History Narrative   Not on file   Social Determinants of Health   Financial Resource Strain: Not on file  Food Insecurity: Not on file  Transportation Needs: Not on file  Physical Activity: Not on file  Stress: Not on file  Social Connections: Not on file  Intimate Partner Violence: Not on file     ROS- All systems are reviewed and negative except as per the HPI above.  Physical Exam: Vitals:   12/22/20 1138  BP: 136/74  Pulse: (!) 55  Weight: 93.9 kg  Height: '5\' 11"'$  (075-GRM m)    GEN- The patient is a well appearing elderly male, alert and oriented x 3 today.   HEENT-head normocephalic, atraumatic, sclera clear, conjunctiva pink, hearing intact, trachea  midline. Lungs- Clear to ausculation bilaterally, normal work of breathing Heart- Regular rate and rhythm, no murmurs, rubs or gallops  GI- soft, NT, ND, + BS Extremities- no clubbing, cyanosis, or edema MS- no significant deformity or atrophy Skin- no rash or lesion Psych- euthymic mood, full affect Neuro- strength and sensation are intact   Wt Readings from Last 3 Encounters:  12/22/20 93.9 kg  12/06/20 93.7 kg  08/03/20 92.8 kg    EKG today demonstrates  SB Vent. rate 55 BPM PR interval 260 ms QRS duration 104 ms QT/QTcB 484/463 ms  Echo 07/02/17 demonstrated  - Left ventricle: The cavity size was mildly dilated. Wall    thickness was increased in a pattern of mild LVH. Systolic    function was normal. The estimated ejection fraction was in the    range of 55% to 60%. Features are consistent with a pseudonormal    left ventricular filling pattern, with concomitant abnormal    relaxation and increased filling pressure (grade 2 diastolic    dysfunction). Doppler parameters are consistent with high    ventricular filling pressure.  - Aortic valve: There was trivial regurgitation.  - Mitral valve: Calcified annulus. Mildly thickened leaflets .    There was mild regurgitation.  - Left atrium: The atrium was mildly dilated.  - Pulmonary arteries: PA peak pressure: 35 mm Hg (S).   Epic records are reviewed at length today  CHA2DS2-VASc Score = 6  The patient's score is based upon: CHF History: No HTN History: Yes Diabetes History: No Stroke History: Yes Vascular Disease History: Yes Age Score: 2 Gender Score: 0     ASSESSMENT AND PLAN: 1. Paroxysmal Atrial Fibrillation (ICD10:  I48.0) The patient's CHA2DS2-VASc score is 6, indicating a 9.7% annual risk of stroke.  Patient in [place] although he does seem to be having more frequent  episodes.  Continue flecainide 100 mg BID. Not on AV nodal agents per Dr Lovena Le due to sinus node dysfunction. He has a 1st degree AV block on  ECG but had been on higher dose of flecainide previously. It was recently decreased 2/2 renal function and bradycardia. Will defer decision to increase to Dr Lovena Le. Continue Eliquis 5 mg BID  2. Secondary Hypercoagulable State (ICD10:  D68.69) The patient is at significant risk for stroke/thromboembolism based upon his CHA2DS2-VASc Score of 6.  Continue Apixaban (Eliquis).   3. HTN Stable, no changes today.    Follow up with Dr Lovena Le as scheduled.    [facility] [address] [place], [place] [address] [phone] 12/22/2020 1:15 PM

## 2020-12-27 ENCOUNTER — Telehealth: Payer: Self-pay | Admitting: Internal Medicine

## 2020-12-27 DIAGNOSIS — I480 Paroxysmal atrial fibrillation: Secondary | ICD-10-CM

## 2020-12-27 NOTE — Telephone Encounter (Signed)
Patient c/o Palpitations:  High priority if patient c/o lightheadedness, shortness of breath, or chest pain  How long have you had palpitations/irregular HR/ Afib? Are you having the symptoms now? Went out of rhythm 7/18, was going to have it shocked back into rhythm but Tuesday 19th it went back into rhythm on it's own. Then went out of rhythm again 8/1, and back into rhythm Tuesday 8/2, no symptoms now  Are you currently experiencing lightheadedness, SOB or CP? Had lightheadedness, not now  Do you have a history of afib (atrial fibrillation) or irregular heart rhythm? yes  Have you checked your BP or HR? (document readings if available): last night 129/87/93, running 111/61-100/66  Are you experiencing any other symptoms? tired  Patient's wife states the patient's heart has gone out of rhythm twice, but both times went back in rhythm on it's own. She says he saw the Afib clinic and was told not to take his BP medication unless his BP was over 140. She states it has not gone over except one time and she did give him his BP medication at that time. She states he is not having any other symptoms, but has been tired.

## 2020-12-27 NOTE — Telephone Encounter (Signed)
Returned call to Pt  Pt has had multiple breakthroughs of afib.  Is wondering if Pt can go back to flecainide 150 mg BID?  Not taking amlodipine d/t low blood pressures.  Advised would discuss with GT

## 2020-12-30 NOTE — Telephone Encounter (Signed)
Follow Up:...        Marland KitchenWife is calling to find out what was decided about the patient.

## 2020-12-31 ENCOUNTER — Ambulatory Visit (INDEPENDENT_AMBULATORY_CARE_PROVIDER_SITE_OTHER): Payer: Medicare Other

## 2020-12-31 DIAGNOSIS — I480 Paroxysmal atrial fibrillation: Secondary | ICD-10-CM

## 2020-12-31 NOTE — Progress Notes (Unsigned)
Enrolled patient for a 3 day Zio XT monitor to be mailed to patients home  

## 2020-12-31 NOTE — Telephone Encounter (Signed)
Advised no medication changes. Recommended 3 day heart monitor. 6 month follow up in September already scheduled.  Verbalized understanding.

## 2020-12-31 NOTE — Telephone Encounter (Signed)
Erik [name] is calling back to f/u in regards to this due to not yet hearing back. Please advise.

## 2021-01-03 ENCOUNTER — Other Ambulatory Visit: Payer: Self-pay | Admitting: Family Medicine

## 2021-01-03 ENCOUNTER — Ambulatory Visit
Admission: RE | Admit: 2021-01-03 | Discharge: 2021-01-03 | Disposition: A | Discharge status: Home / Self Care | Payer: Medicare Other | Source: Ambulatory Visit | Attending: Family Medicine | Admitting: Family Medicine

## 2021-01-03 ENCOUNTER — Other Ambulatory Visit: Payer: Self-pay

## 2021-01-03 DIAGNOSIS — M545 Low back pain, unspecified: Secondary | ICD-10-CM | POA: Diagnosis not present

## 2021-01-03 DIAGNOSIS — M5440 Lumbago with sciatica, unspecified side: Secondary | ICD-10-CM

## 2021-01-03 DIAGNOSIS — G5600 Carpal tunnel syndrome, unspecified upper limb: Secondary | ICD-10-CM | POA: Diagnosis not present

## 2021-01-04 DIAGNOSIS — I480 Paroxysmal atrial fibrillation: Secondary | ICD-10-CM

## 2021-01-05 DIAGNOSIS — M545 Low back pain, unspecified: Secondary | ICD-10-CM | POA: Diagnosis not present

## 2021-01-05 DIAGNOSIS — M4856XA Collapsed vertebra, not elsewhere classified, lumbar region, initial encounter for fracture: Secondary | ICD-10-CM | POA: Diagnosis not present

## 2021-01-11 DIAGNOSIS — I480 Paroxysmal atrial fibrillation: Secondary | ICD-10-CM | POA: Diagnosis not present

## 2021-02-01 DIAGNOSIS — M545 Low back pain, unspecified: Secondary | ICD-10-CM | POA: Diagnosis not present

## 2021-02-01 DIAGNOSIS — M4856XA Collapsed vertebra, not elsewhere classified, lumbar region, initial encounter for fracture: Secondary | ICD-10-CM | POA: Diagnosis not present

## 2021-02-02 DIAGNOSIS — R350 Frequency of micturition: Secondary | ICD-10-CM | POA: Diagnosis not present

## 2021-02-02 DIAGNOSIS — R3914 Feeling of incomplete bladder emptying: Secondary | ICD-10-CM | POA: Diagnosis not present

## 2021-02-04 ENCOUNTER — Ambulatory Visit: Payer: Medicare Other | Admitting: Internal Medicine

## 2021-02-04 ENCOUNTER — Other Ambulatory Visit: Payer: Self-pay

## 2021-02-04 VITALS — BP 156/88 | HR 53 | Ht 71.0 in | Wt 211.0 lb

## 2021-02-04 DIAGNOSIS — I480 Paroxysmal atrial fibrillation: Secondary | ICD-10-CM

## 2021-02-04 NOTE — Progress Notes (Signed)
HPI [name] returns today for followup. He is a pleasant 80 yo man with a h/o ETOH abuse, atrial flutter s/p ablation, PAF controlled on flecainide, sinus node dysfunction, dementia and COPD. He was hospitalized over a month ago with acute renal failure due to urinary obstruction, and had to have an indwelling foley catheter. He had his dose of flecainide reduced to 100 bid. He has done well in the interim but has fallen and cracked a vertebra and is considering kyphoplasty. He has some back pain. He has tried to remain active. He denies syncope. He wore a cardiac monitor which demonstrated an ave HR of 53/min.  Allergies  Allergen Reactions   Albuterol Sulfate [Albuterol] Other (See Comments)    rapid heartbeat; afib   Hyoscyamine Sulfate     Other reaction(s): urinary retention   Adhesive [Tape] Rash and Other (See Comments)    New heart monitor/patch (adhesive) broke out the skin and left a large red patch (still visible after 3 weeks   Warfarin And Related Other (See Comments)    Patient's INR could never get regulated while on this     Current Outpatient Medications  Medication Sig Dispense Refill   acetaminophen (TYLENOL) 500 MG tablet Take 500-1,000 mg by mouth every 6 (six) hours as needed (pain).     amLODipine (NORVASC) 5 MG tablet Take 1 tablet (5 mg total) by mouth daily. (Patient not taking: Reported on 12/22/2020) 90 tablet 3   calcitonin, salmon, (MIACALCIN/FORTICAL) 200 UNIT/ACT nasal spray Place 1 spray into alternate nostrils daily.     Carboxymethylcellulose Sodium (THERATEARS) 0.25 % SOLN Place 1 drop into both eyes daily as needed (dry eyes).     COVID-19 mRNA Vac-TriS, Pfizer, SUSP injection USE AS DIRECTED .3 mL 0   donepezil (ARICEPT) 10 MG tablet Take 10 mg by mouth at bedtime.     ELIQUIS 5 MG TABS tablet TAKE 1 TABLET BY MOUTH TWICE A DAY (Patient taking differently: Take 5 mg by mouth 2 (two) times daily.) 60 tablet 11   FIBER PO Take 1 capsule by  mouth daily.     finasteride (PROSCAR) 5 MG tablet Take 5 mg by mouth at bedtime.   12   flecainide (TAMBOCOR) 100 MG tablet Take 1 tablet (100 mg total) by mouth 2 (two) times daily. 180 tablet 3   INCRUSE ELLIPTA 62.5 MCG/INH AEPB Take 1 puff by mouth daily.  1   memantine (NAMENDA) 5 MG tablet Take 5 mg by mouth 2 (two) times daily.     Multiple Vitamins-Minerals (PRESERVISION AREDS 2+MULTI VIT PO) Take 1 capsule by mouth in the morning and at bedtime.     nitroGLYCERIN (NITROSTAT) 0.4 MG SL tablet Place 1 tablet (0.4 mg total) under the tongue every 5 (five) minutes as needed for chest pain (MAX 3 TABLETS). (Patient not taking: Reported on 12/22/2020) 25 tablet 11   polyethylene glycol (MIRALAX / GLYCOLAX) 17 g packet Take 17 g by mouth daily as needed for mild constipation. 14 each 0   tamsulosin (FLOMAX) 0.4 MG CAPS capsule Take 1 capsule (0.4 mg total) by mouth daily after breakfast. 14 capsule 0   No current facility-administered medications for this visit.   Facility-Administered Medications Ordered in Other Visits  Medication Dose Route Frequency Provider Last Rate Last Admin   gadobenate dimeglumine (MULTIHANCE) injection 20 mL  20 mL Intravenous Once Radiologist, Medication, MD         Past Medical History:  Diagnosis  Date   Arthritis    both knees   Atrial fibrillation (HCC)    Paroxysmal, s/p PVI 11/11/10   Atrial flutter (HCC)    s/p RFCA  by GT   Basal cell carcinoma of ear    CAD (coronary artery disease)    a. cath 5/12: pLAD 25%, D1 25%, EF 65 % (done 2/2 abnormal myoview with inferapical  ischemia)   Dyslipidemia    Dysrhythmia    HTN (hypertension)    Hx of echocardiogram    Echo (9/15):  Mild LVH, EF 55-60%, no RWMA, normal diast function, mild MR, mod LAE, mildly increased pulmonary pressures (PASP 30 mmHg).   Hypertension    Shortness of breath dyspnea    has a low heart rate   Snoring    a. sleep study 11/2012 neg for OSA; increased leg movement suspicious  for primary movement d/o (ie restless leg syndrome)    Stroke (HCC)    mild stroke with  second heart ablation   TIA (transient ischemic attack)    11/12/10 post afib ablation    ROS:   All systems reviewed and negative except as noted in the HPI.   Past Surgical History:  Procedure Laterality Date   atrial flutter ablation     by GT   basal cell cancer resection     CARDIOVERSION  10/31/2011   Procedure: CARDIOVERSION;  Surgeon: Jolaine Artist, MD;  Location: [facility] ENDOSCOPY;  Service: Cardiovascular;  Laterality: N/A;   CATARACT EXTRACTION, BILATERAL     COLONOSCOPY     EYE SURGERY     growth under lid- x2   HEMORRHOID SURGERY     KNEE ARTHROSCOPY Bilateral    bilateral   SHOULDER ARTHROSCOPY Right 08/04/2015   Procedure: ARTHROSCOPY RIGHT SHOULDER;  Surgeon: Frederik Pear, MD;  Location: [place];  Service: Orthopedics;  Laterality: Right;   TEE WITHOUT CARDIOVERSION  10/31/2011   Procedure: TRANSESOPHAGEAL ECHOCARDIOGRAM (TEE);  Surgeon: Jolaine Artist, MD;  Location: [facility] ENDOSCOPY;  Service: Cardiovascular;  Laterality: N/A;     Family History  Problem Relation Age of Onset   Hypertension Mother    Stroke Mother    Heart disease Mother    Arthritis Mother    Heart attack Neg Hx      Social History   Socioeconomic History   Marital status: Divorced    Spouse name: Not on file   Number of children: 1   Years of education: Not on file   Highest education level: Not on file  Occupational History   Occupation: Tax adviser for [employer]    Employer: RETIRED    Comment: Retired  Tobacco Use   Smoking status: Former    Packs/day: 1.50    Years: 20.00    Pack years: 30.00    Types: Cigarettes    Quit date: 05/23/1975    Years since quitting: 45.7   Smokeless tobacco: Never  Vaping Use   Vaping Use: Never used  Substance and Sexual Activity   Alcohol use: No    Alcohol/week: 14.0 standard drinks    Types: 14 Standard drinks or equivalent per week     Comment: has not had a drink in 9 months   Drug use: No   Sexual activity: Not on file  Other Topics Concern   Not on file  Social History Narrative   Not on file   Social Determinants of Health   Financial Resource Strain: Not on file  Food Insecurity:  Not on file  Transportation Needs: Not on file  Physical Activity: Not on file  Stress: Not on file  Social Connections: Not on file  Intimate Partner Violence: Not on file     BP (!) 156/88   Pulse (!) 53   Ht '5\' 11"'$  (1.803 m)   Wt 211 lb (95.7 kg)   SpO2 96%   BMI 29.43 kg/m   Physical Exam:  Well appearing NAD HEENT: Unremarkable Neck:  No JVD, no thyromegally Lymphatics:  No adenopathy Back:  No CVA tenderness Lungs:  Clear HEART:  Regular rate rhythm, no murmurs, no rubs, no clicks Abd:  soft, positive bowel sounds, no organomegally, no rebound, no guarding Ext:  2 plus pulses, no edema, no cyanosis, no clubbing Skin:  No rashes no nodules Neuro:  CN II through XII intact, motor grossly intact  EKG - sinus bradycardia   Assess/Plan:  1. PAF - he appears to be maintaining NSR on a lower dose of flecainide. Continue.  2. HTN - his bp is failry well controlled. We discussed switching norvasc but will hold for now. 3. Peripheral edema - he admits to sodium indiscretion and I asked him to eat less salt. I considered stopping norvasc but will continue for now. He will keep his legs elevated. 4. Sinus node dysfunction - he is off of all sinus node slowing drugs except for flecainide. We will follow. 5. ETOH abuse - in remission. I encouraged him to abstain.   Carleene Overlie Kyrie Bun,MD

## 2021-02-04 NOTE — Patient Instructions (Addendum)
Medication Instructions:  Your physician recommends that you continue on your current medications as directed. Please refer to the Current Medication list given to you today.  *If you need a refill on your cardiac medications before your next appointment, please call your pharmacy*   Lab Work: None ordered   Testing/Procedures: None ordered   Follow-Up: At [facility], you and your health needs are our priority.  As part of our continuing mission to provide you with exceptional heart care, we have created designated Provider Care Teams.  These Care Teams include your primary Cardiologist (physician) and Advanced Practice Providers (APPs -  Physician Assistants and Nurse Practitioners) who all work together to provide you with the care you need, when you need it.  Your next appointment:   6 month(s)  The format for your next appointment:   In Person  Provider:   Cristopher Peru, MD    Thank you for choosing [facility]!!   [phone]

## 2021-02-08 DIAGNOSIS — S32000A Wedge compression fracture of unspecified lumbar vertebra, initial encounter for closed fracture: Secondary | ICD-10-CM | POA: Diagnosis not present

## 2021-02-08 DIAGNOSIS — G629 Polyneuropathy, unspecified: Secondary | ICD-10-CM | POA: Diagnosis not present

## 2021-02-08 DIAGNOSIS — J449 Chronic obstructive pulmonary disease, unspecified: Secondary | ICD-10-CM | POA: Diagnosis not present

## 2021-02-08 DIAGNOSIS — I10 Essential (primary) hypertension: Secondary | ICD-10-CM | POA: Diagnosis not present

## 2021-02-08 DIAGNOSIS — Z23 Encounter for immunization: Secondary | ICD-10-CM | POA: Diagnosis not present

## 2021-02-08 DIAGNOSIS — R269 Unspecified abnormalities of gait and mobility: Secondary | ICD-10-CM | POA: Diagnosis not present

## 2021-02-08 DIAGNOSIS — E7800 Pure hypercholesterolemia, unspecified: Secondary | ICD-10-CM | POA: Diagnosis not present

## 2021-02-08 DIAGNOSIS — R413 Other amnesia: Secondary | ICD-10-CM | POA: Diagnosis not present

## 2021-02-14 DIAGNOSIS — M85852 Other specified disorders of bone density and structure, left thigh: Secondary | ICD-10-CM | POA: Diagnosis not present

## 2021-02-14 DIAGNOSIS — M85851 Other specified disorders of bone density and structure, right thigh: Secondary | ICD-10-CM | POA: Diagnosis not present

## 2021-02-22 ENCOUNTER — Emergency Department (HOSPITAL_COMMUNITY): Payer: Medicare Other

## 2021-02-22 ENCOUNTER — Emergency Department (HOSPITAL_COMMUNITY)
Admission: EM | Admit: 2021-02-22 | Discharge: 2021-02-22 | Disposition: A | Discharge status: Home / Self Care | Payer: Medicare Other | Attending: Emergency Medicine | Admitting: Emergency Medicine

## 2021-02-22 ENCOUNTER — Other Ambulatory Visit: Payer: Self-pay

## 2021-02-22 DIAGNOSIS — S40212A Abrasion of left shoulder, initial encounter: Secondary | ICD-10-CM | POA: Diagnosis not present

## 2021-02-22 DIAGNOSIS — R0789 Other chest pain: Secondary | ICD-10-CM | POA: Diagnosis not present

## 2021-02-22 DIAGNOSIS — I10 Essential (primary) hypertension: Secondary | ICD-10-CM | POA: Insufficient documentation

## 2021-02-22 DIAGNOSIS — Z85828 Personal history of other malignant neoplasm of skin: Secondary | ICD-10-CM | POA: Insufficient documentation

## 2021-02-22 DIAGNOSIS — S60222A Contusion of left hand, initial encounter: Secondary | ICD-10-CM | POA: Diagnosis not present

## 2021-02-22 DIAGNOSIS — Z20822 Contact with and (suspected) exposure to covid-19: Secondary | ICD-10-CM | POA: Diagnosis not present

## 2021-02-22 DIAGNOSIS — Z87891 Personal history of nicotine dependence: Secondary | ICD-10-CM | POA: Diagnosis not present

## 2021-02-22 DIAGNOSIS — K449 Diaphragmatic hernia without obstruction or gangrene: Secondary | ICD-10-CM | POA: Diagnosis not present

## 2021-02-22 DIAGNOSIS — M47812 Spondylosis without myelopathy or radiculopathy, cervical region: Secondary | ICD-10-CM | POA: Diagnosis not present

## 2021-02-22 DIAGNOSIS — M546 Pain in thoracic spine: Secondary | ICD-10-CM

## 2021-02-22 DIAGNOSIS — S0990XA Unspecified injury of head, initial encounter: Secondary | ICD-10-CM | POA: Diagnosis not present

## 2021-02-22 DIAGNOSIS — I2510 Atherosclerotic heart disease of native coronary artery without angina pectoris: Secondary | ICD-10-CM | POA: Insufficient documentation

## 2021-02-22 DIAGNOSIS — J8410 Pulmonary fibrosis, unspecified: Secondary | ICD-10-CM | POA: Diagnosis not present

## 2021-02-22 DIAGNOSIS — M542 Cervicalgia: Secondary | ICD-10-CM | POA: Diagnosis not present

## 2021-02-22 DIAGNOSIS — Z7901 Long term (current) use of anticoagulants: Secondary | ICD-10-CM | POA: Insufficient documentation

## 2021-02-22 DIAGNOSIS — S80211A Abrasion, right knee, initial encounter: Secondary | ICD-10-CM | POA: Diagnosis not present

## 2021-02-22 DIAGNOSIS — K828 Other specified diseases of gallbladder: Secondary | ICD-10-CM | POA: Diagnosis not present

## 2021-02-22 DIAGNOSIS — R109 Unspecified abdominal pain: Secondary | ICD-10-CM | POA: Diagnosis not present

## 2021-02-22 DIAGNOSIS — D171 Benign lipomatous neoplasm of skin and subcutaneous tissue of trunk: Secondary | ICD-10-CM | POA: Diagnosis not present

## 2021-02-22 DIAGNOSIS — S199XXA Unspecified injury of neck, initial encounter: Secondary | ICD-10-CM | POA: Diagnosis not present

## 2021-02-22 DIAGNOSIS — Z041 Encounter for examination and observation following transport accident: Secondary | ICD-10-CM | POA: Diagnosis not present

## 2021-02-22 DIAGNOSIS — R410 Disorientation, unspecified: Secondary | ICD-10-CM | POA: Diagnosis not present

## 2021-02-22 DIAGNOSIS — G319 Degenerative disease of nervous system, unspecified: Secondary | ICD-10-CM | POA: Diagnosis not present

## 2021-02-22 DIAGNOSIS — Z79899 Other long term (current) drug therapy: Secondary | ICD-10-CM | POA: Insufficient documentation

## 2021-02-22 DIAGNOSIS — S6992XA Unspecified injury of left wrist, hand and finger(s), initial encounter: Secondary | ICD-10-CM | POA: Diagnosis present

## 2021-02-22 DIAGNOSIS — J9811 Atelectasis: Secondary | ICD-10-CM | POA: Diagnosis not present

## 2021-02-22 DIAGNOSIS — Y92410 Unspecified street and highway as the place of occurrence of the external cause: Secondary | ICD-10-CM | POA: Insufficient documentation

## 2021-02-22 DIAGNOSIS — J984 Other disorders of lung: Secondary | ICD-10-CM | POA: Diagnosis not present

## 2021-02-22 DIAGNOSIS — S4992XA Unspecified injury of left shoulder and upper arm, initial encounter: Secondary | ICD-10-CM | POA: Diagnosis not present

## 2021-02-22 DIAGNOSIS — I480 Paroxysmal atrial fibrillation: Secondary | ICD-10-CM | POA: Insufficient documentation

## 2021-02-22 DIAGNOSIS — K5790 Diverticulosis of intestine, part unspecified, without perforation or abscess without bleeding: Secondary | ICD-10-CM | POA: Diagnosis not present

## 2021-02-22 LAB — CBC
HCT: 43 % (ref 39.0–52.0)
Hemoglobin: 14.1 g/dL (ref 13.0–17.0)
MCH: 32 pg (ref 26.0–34.0)
MCHC: 32.8 g/dL (ref 30.0–36.0)
MCV: 97.7 fL (ref 80.0–100.0)
Platelets: 254 10*3/uL (ref 150–400)
RBC: 4.4 MIL/uL (ref 4.22–5.81)
RDW: 12.6 % (ref 11.5–15.5)
WBC: 9.4 10*3/uL (ref 4.0–10.5)
nRBC: 0 % (ref 0.0–0.2)

## 2021-02-22 LAB — I-STAT CHEM 8, ED
BUN: 31 mg/dL — ABNORMAL HIGH (ref 8–23)
Calcium, Ion: 1.25 mmol/L (ref 1.15–1.40)
Chloride: 106 mmol/L (ref 98–111)
Creatinine, Ser: 1.5 mg/dL — ABNORMAL HIGH (ref 0.61–1.24)
Glucose, Bld: 105 mg/dL — ABNORMAL HIGH (ref 70–99)
HCT: 40 % (ref 39.0–52.0)
Hemoglobin: 13.6 g/dL (ref 13.0–17.0)
Potassium: 4.5 mmol/L (ref 3.5–5.1)
Sodium: 142 mmol/L (ref 135–145)
TCO2: 28 mmol/L (ref 22–32)

## 2021-02-22 LAB — COMPREHENSIVE METABOLIC PANEL
ALT: 16 U/L (ref 0–44)
AST: 19 U/L (ref 15–41)
Albumin: 3.7 g/dL (ref 3.5–5.0)
Alkaline Phosphatase: 65 U/L (ref 38–126)
Anion gap: 8 (ref 5–15)
BUN: 29 mg/dL — ABNORMAL HIGH (ref 8–23)
CO2: 27 mmol/L (ref 22–32)
Calcium: 9.6 mg/dL (ref 8.9–10.3)
Chloride: 105 mmol/L (ref 98–111)
Creatinine, Ser: 1.43 mg/dL — ABNORMAL HIGH (ref 0.61–1.24)
GFR, Estimated: 50 mL/min — ABNORMAL LOW (ref >60)
Glucose, Bld: 111 mg/dL — ABNORMAL HIGH (ref 70–99)
Potassium: 4.5 mmol/L (ref 3.5–5.1)
Sodium: 140 mmol/L (ref 135–145)
Total Bilirubin: 0.6 mg/dL (ref 0.3–1.2)
Total Protein: 6.6 g/dL (ref 6.5–8.1)

## 2021-02-22 LAB — RESP PANEL BY RT-PCR (FLU A&B, COVID) ARPGX2
Influenza A by PCR: NEGATIVE
Influenza B by PCR: NEGATIVE
SARS Coronavirus 2 by RT PCR: NEGATIVE

## 2021-02-22 LAB — LACTIC ACID, PLASMA: Lactic Acid, Venous: 1.6 mmol/L (ref 0.5–1.9)

## 2021-02-22 LAB — SAMPLE TO BLOOD BANK

## 2021-02-22 LAB — PROTIME-INR
INR: 1.1 (ref 0.8–1.2)
Prothrombin Time: 13.8 seconds (ref 11.4–15.2)

## 2021-02-22 MED ORDER — IOHEXOL 350 MG/ML SOLN
80.0000 mL | Freq: Once | INTRAVENOUS | Status: AC | PRN
  Administered 2021-02-22: 80 mL via INTRAVENOUS

## 2021-02-22 NOTE — ED Notes (Signed)
Provider at beddside

## 2021-02-22 NOTE — ED Notes (Signed)
Returned from ct scan 

## 2021-02-22 NOTE — ED Notes (Signed)
Radiology at bedside

## 2021-02-22 NOTE — ED Notes (Signed)
Pt has abrasion and bruising noted lt hand, abrasions noted to lt shoulder rt lower leg, pain to mid upper chest, upper back, base of neck.

## 2021-02-22 NOTE — ED Notes (Signed)
Provider at bedside.c-collar removed by provider

## 2021-02-22 NOTE — ED Notes (Signed)
Patient transported to CT 

## 2021-02-22 NOTE — ED Notes (Signed)
Provider at bedside
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
## 2021-02-22 NOTE — ED Notes (Signed)
Provider sent message about VS at this time
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
## 2021-02-22 NOTE — ED Notes (Signed)
Contacted ct at this time about transport to ct scan wife at bedside at this time as well
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
## 2021-03-01 DIAGNOSIS — M4856XA Collapsed vertebra, not elsewhere classified, lumbar region, initial encounter for fracture: Secondary | ICD-10-CM | POA: Diagnosis not present

## 2021-03-16 DIAGNOSIS — D1801 Hemangioma of skin and subcutaneous tissue: Secondary | ICD-10-CM | POA: Diagnosis not present

## 2021-03-16 DIAGNOSIS — Z85828 Personal history of other malignant neoplasm of skin: Secondary | ICD-10-CM | POA: Diagnosis not present

## 2021-03-16 DIAGNOSIS — D2372 Other benign neoplasm of skin of left lower limb, including hip: Secondary | ICD-10-CM | POA: Diagnosis not present

## 2021-03-16 DIAGNOSIS — C44519 Basal cell carcinoma of skin of other part of trunk: Secondary | ICD-10-CM | POA: Diagnosis not present

## 2021-03-16 DIAGNOSIS — D225 Melanocytic nevi of trunk: Secondary | ICD-10-CM | POA: Diagnosis not present

## 2021-03-16 DIAGNOSIS — L570 Actinic keratosis: Secondary | ICD-10-CM | POA: Diagnosis not present

## 2021-03-16 DIAGNOSIS — D0462 Carcinoma in situ of skin of left upper limb, including shoulder: Secondary | ICD-10-CM | POA: Diagnosis not present

## 2021-03-16 DIAGNOSIS — L821 Other seborrheic keratosis: Secondary | ICD-10-CM | POA: Diagnosis not present

## 2021-03-16 DIAGNOSIS — C44319 Basal cell carcinoma of skin of other parts of face: Secondary | ICD-10-CM | POA: Diagnosis not present

## 2021-03-16 DIAGNOSIS — L814 Other melanin hyperpigmentation: Secondary | ICD-10-CM | POA: Diagnosis not present

## 2021-04-04 DIAGNOSIS — I959 Hypotension, unspecified: Secondary | ICD-10-CM | POA: Diagnosis not present

## 2021-04-04 DIAGNOSIS — R269 Unspecified abnormalities of gait and mobility: Secondary | ICD-10-CM | POA: Diagnosis not present

## 2021-04-04 DIAGNOSIS — I10 Essential (primary) hypertension: Secondary | ICD-10-CM | POA: Diagnosis not present

## 2021-04-22 ENCOUNTER — Telehealth (HOSPITAL_COMMUNITY): Payer: Self-pay | Admitting: *Deleted

## 2021-04-22 NOTE — Telephone Encounter (Signed)
Patient wife called in on 12/1 stating pt went into afib in the afternoon 95 HR 146/91 pt took an amlodipine 5mg . Intermittent chest pressure with the elevated HR.Pt normally converts on his own per Adline Peals PA call with update on 12/2.  Patient's wife called with update this morning stating his BP is 85/54 HR 86 feels dizzy but otherwise ok. No current chest pain but was up a lot during night urinating.  Per Adline Peals PA will increase PO intake this morning to bring BP up. ER precautions reviewed with wife/patient. NO amlodipine today. If still out of rhythm on Monday will call to be seen.

## 2021-04-25 ENCOUNTER — Telehealth: Payer: Self-pay | Admitting: Internal Medicine

## 2021-04-25 NOTE — Telephone Encounter (Signed)
Spoke with pt's girlfriend and wanted to let Dr Lovena Le know pt has had 3 episodes of afib in a couple week timeframe and pt was told to call if started having frequent episodes Pt converted on own and is normal today HR 53 and B/P 140/77 Will forward to Dr Lovena Le for review .Adonis Housekeeper

## 2021-04-25 NOTE — Telephone Encounter (Signed)
Pt's wife Arnold Long is calling to advise the pts heart was out of rhythm last week and 2 weeks before that, pt called the [facility]. Pt is not due for an OV until 07/2021, she wants to know if Dr. Lovena Le would like to see the patient before 07/2021  Pt's heart is not out of rhythm today!! Bryna Colander can be reached at  [phone]

## 2021-04-26 NOTE — Telephone Encounter (Signed)
I would like to see him in next two weeks. GT

## 2021-04-27 NOTE — Telephone Encounter (Signed)
Appt scheduled

## 2021-04-28 ENCOUNTER — Ambulatory Visit: Payer: Medicare Other | Admitting: Internal Medicine

## 2021-04-28 ENCOUNTER — Other Ambulatory Visit: Payer: Self-pay

## 2021-04-28 ENCOUNTER — Telehealth: Payer: Self-pay | Admitting: Internal Medicine

## 2021-04-28 ENCOUNTER — Encounter: Payer: Self-pay | Admitting: Internal Medicine

## 2021-04-28 VITALS — BP 124/86 | HR 54 | Ht 71.0 in | Wt 216.0 lb

## 2021-04-28 DIAGNOSIS — I480 Paroxysmal atrial fibrillation: Secondary | ICD-10-CM | POA: Diagnosis not present

## 2021-04-28 DIAGNOSIS — I25118 Atherosclerotic heart disease of native coronary artery with other forms of angina pectoris: Secondary | ICD-10-CM | POA: Diagnosis not present

## 2021-04-28 MED ORDER — NITROGLYCERIN 0.4 MG SL SUBL: 0.4000 mg | SUBLINGUAL_TABLET | SUBLINGUAL | 11 refills | Status: AC | PRN

## 2021-04-28 NOTE — Progress Notes (Signed)
HPI Mr. Hackmann returns today for followup. He is a pleasant 80 yo man with a h/o ETOH abuse, atrial flutter s/p ablation, PAF controlled on flecainide, sinus node dysfunction, dementia and COPD. He was hospitalized over 3 months ago with acute renal failure due to urinary obstruction, and had to have an indwelling foley catheter. He had his dose of flecainide reduced to 100 bid. He has done well in the interim but has fallen and cracked a vertebra and is considering kyphoplasty. He has some back pain. He has tried to remain active. He denies syncope. He wore a cardiac monitor which demonstrated an ave HR of 53/min.   His wife called last week stating that the patient was having more atrial fib and felt poorly. His pulse was in the 80's.       Allergies   Allergies  Allergen Reactions   Albuterol Sulfate [Albuterol] Other (See Comments)    rapid heartbeat; afib   Hyoscyamine Sulfate     Other reaction(s): urinary retention   Adhesive [Tape] Rash and Other (See Comments)    New heart monitor/patch (adhesive) broke out the skin and left a large red patch (still visible after 3 weeks   Warfarin And Related Other (See Comments)    Patient's INR could never get regulated while on this     Current Outpatient Medications  Medication Sig Dispense Refill   acetaminophen (TYLENOL) 500 MG tablet Take 500-1,000 mg by mouth every 6 (six) hours as needed (pain).     amLODipine (NORVASC) 5 MG tablet Take 1 tablet (5 mg total) by mouth daily. 90 tablet 3   Carboxymethylcellulose Sodium (THERATEARS) 0.25 % SOLN Place 1 drop into both eyes daily as needed (dry eyes).     COVID-19 mRNA Vac-TriS, Pfizer, SUSP injection USE AS DIRECTED .3 mL 0   donepezil (ARICEPT) 10 MG tablet Take 10 mg by mouth at bedtime.     ELIQUIS 5 MG TABS tablet TAKE 1 TABLET BY MOUTH TWICE A DAY (Patient taking differently: Take 5 mg by mouth 2 (two) times daily.) 60 tablet 11   FIBER PO Take 1 capsule by mouth daily.      finasteride (PROSCAR) 5 MG tablet Take 5 mg by mouth at bedtime.   12   flecainide (TAMBOCOR) 100 MG tablet Take 1 tablet (100 mg total) by mouth 2 (two) times daily. 180 tablet 3   INCRUSE ELLIPTA 62.5 MCG/INH AEPB Take 1 puff by mouth daily.  1   memantine (NAMENDA) 5 MG tablet Take 5 mg by mouth 2 (two) times daily.     Multiple Vitamins-Minerals (PRESERVISION AREDS 2+MULTI VIT PO) Take 1 capsule by mouth in the morning and at bedtime.     polyethylene glycol (MIRALAX / GLYCOLAX) 17 g packet Take 17 g by mouth daily as needed for mild constipation. 14 each 0   tamsulosin (FLOMAX) 0.4 MG CAPS capsule Take 1 capsule (0.4 mg total) by mouth daily after breakfast. 14 capsule 0   nitroGLYCERIN (NITROSTAT) 0.4 MG SL tablet Place 1 tablet (0.4 mg total) under the tongue every 5 (five) minutes as needed for chest pain (MAX 3 TABLETS). 25 tablet 11   No current facility-administered medications for this visit.   Facility-Administered Medications Ordered in Other Visits  Medication Dose Route Frequency Provider Last Rate Last Admin   gadobenate dimeglumine (MULTIHANCE) injection 20 mL  20 mL Intravenous Once Radiologist, Medication, MD         Past Medical  History:  Diagnosis Date   Arthritis    both knees   Atrial fibrillation (HCC)    Paroxysmal, s/p PVI 11/11/10   Atrial flutter (HCC)    s/p RFCA  by GT   Basal cell carcinoma of ear    CAD (coronary artery disease)    a. cath 5/12: pLAD 25%, D1 25%, EF 65 % (done 2/2 abnormal myoview with inferapical  ischemia)   Dyslipidemia    Dysrhythmia    HTN (hypertension)    Hx of echocardiogram    Echo (9/15):  Mild LVH, EF 55-60%, no RWMA, normal diast function, mild MR, mod LAE, mildly increased pulmonary pressures (PASP 30 mmHg).   Hypertension    Shortness of breath dyspnea    has a low heart rate   Snoring    a. sleep study 11/2012 neg for OSA; increased leg movement suspicious for primary movement d/o (ie restless leg syndrome)     Stroke (HCC)    mild stroke with  second heart ablation   TIA (transient ischemic attack)    11/12/10 post afib ablation    ROS:   All systems reviewed and negative except as noted in the HPI.   Past Surgical History:  Procedure Laterality Date   atrial flutter ablation     by GT   basal cell cancer resection     CARDIOVERSION  10/31/2011   Procedure: CARDIOVERSION;  Surgeon: Jolaine Artist, MD;  Location[facility] ENDOSCOPY;  Service: Cardiovascular;  Laterality: N/A;   CATARACT EXTRACTION, BILATERAL     COLONOSCOPY     EYE SURGERY     growth under lid- x2   HEMORRHOID SURGERY     KNEE ARTHROSCOPY Bilateral    bilateral   SHOULDER ARTHROSCOPY Right 08/04/2015   Procedure: ARTHROSCOPY RIGHT SHOULDER;  Surgeon: Frederik Pear, MD;  Location: [facility];  Service: Orthopedics;  Laterality: Right;   TEE WITHOUT CARDIOVERSION  10/31/2011   Procedure: TRANSESOPHAGEAL ECHOCARDIOGRAM (TEE);  Surgeon: Jolaine Artist, MD;  Location: [facility] ENDOSCOPY;  Service: Cardiovascular;  Laterality: N/A;     Family History  Problem Relation Age of Onset   Hypertension Mother    Stroke Mother    Heart disease Mother    Arthritis Mother    Heart attack Neg Hx      Social History   Socioeconomic History   Marital status: Divorced    Spouse name: Not on file   Number of children: 1   Years of education: Not on file   Highest education level: Not on file  Occupational History   Occupation: Tax adviser for [employer]    Employer: RETIRED    Comment: Retired  Tobacco Use   Smoking status: Former    Packs/day: 1.50    Years: 20.00    Pack years: 30.00    Types: Cigarettes    Quit date: 05/23/1975    Years since quitting: 45.9   Smokeless tobacco: Never  Vaping Use   Vaping Use: Never used  Substance and Sexual Activity   Alcohol use: No    Alcohol/week: 14.0 standard drinks    Types: 14 Standard drinks or equivalent per week    Comment: has not had a drink in 9 months   Drug use:  No   Sexual activity: Not on file  Other Topics Concern   Not on file  Social History Narrative   Not on file   Social Determinants of Health   Financial Resource Strain: Not on file  Food Insecurity: Not on file  Transportation Needs: Not on file  Physical Activity: Not on file  Stress: Not on file  Social Connections: Not on file  Intimate Partner Violence: Not on file     BP 124/86   Pulse (!) 54   Ht 5\' 11"  (1.803 m)   Wt 216 lb (98 kg)   BMI 30.13 kg/m   Physical Exam:  Well appearing NAD HEENT: Unremarkable Neck:  No JVD, no thyromegally Lymphatics:  No adenopathy Back:  No CVA tenderness Lungs:  Clear with no wheezes HEART:  Regular rate rhythm, no murmurs, no rubs, no clicks Abd:  soft, positive bowel sounds, no organomegally, no rebound, no guarding Ext:  2 plus pulses, no edema, no cyanosis, no clubbing Skin:  No rashes no nodules Neuro:  CN II through XII intact, motor grossly intact  EKG - nsr with rare PVC  Assess/Plan:  1. PAF - he appears to be maintaining NSR on a lower dose of flecainide. Continue. We discussed switching to dofetilide but will hold off on this for now. 2. HTN - his bp is fairly well controlled.  3. Peripheral edema - he admits to sodium indiscretion and I asked him to eat less salt. I considered stopping norvasc but will continue for now. He will keep his legs elevated. 4. Sinus node dysfunction - he is off of all sinus node slowing drugs except for flecainide. We will follow. 5. ETOH abuse - in remission. I encouraged him to abstain.   Carleene Overlie Danta Baumgardner,MD

## 2021-04-28 NOTE — Patient Instructions (Addendum)
Medication Instructions:  °Your physician recommends that you continue on your current medications as directed. Please refer to the Current Medication list given to you today. ° °Labwork: °None ordered. ° °Testing/Procedures: °None ordered. ° °Follow-Up: °Your physician wants you to follow-up in: 6 months with Gregg Taylor, MD  ° ° °Any Other Special Instructions Will Be Listed Below (If Applicable). ° °If you need a refill on your cardiac medications before your next appointment, please call your pharmacy.  ° ° ° ° °

## 2021-04-28 NOTE — Telephone Encounter (Signed)
Returned call to wife.  Advised that Dr. Lovena Le had wanted her to contact us if his afib was persistent-more than a few hours at a time.  Wife states she understood, he just feels bad when he is out of rhythm.  Wife will continue to monitor and let this nurse know if afib becomes more frequent then it currently is.

## 2021-04-28 NOTE — Telephone Encounter (Signed)
Patient c/o Palpitations:  High priority if patient c/o lightheadedness, shortness of breath, or chest pain  How long have you had palpitations/irregular HR/ Afib? A-Fib Are you having the symptoms now? yes  Are you currently experiencing lightheadedness, SOB or CP? no  Do you have a history of afib (atrial fibrillation) or irregular heart rhythm? A-fib  Have you checked your BP or HR? (document readings if available): BP 149/91 HR 99  Are you experiencing any other symptoms? They went shopping after appt today, and patient went back into A-Fib.  They wanted to make Dr. Lovena Le aware, as he said they might have to adjusted his medication.

## 2021-05-09 ENCOUNTER — Telehealth: Payer: Self-pay | Admitting: Internal Medicine

## 2021-05-09 MED ORDER — DRONEDARONE HCL 400 MG PO TABS: 400.0000 mg | ORAL_TABLET | Freq: Two times a day (BID) | ORAL | 11 refills | Status: DC

## 2021-05-09 NOTE — Telephone Encounter (Signed)
Called patient's wife (DPR) back about message. Consulted Dr. Lovena Le before calling back. Dr. Veneda Melter advised patient to STOP flecainide and start Multaq in 3 days at 400 mg twice daily with meals, and then come into the office in 1 week for an EKG. Sent in [place] to patient's pharmacy of choice. Made patient a nurse visit for EKG next week on 05/19/21. Patient's wife verbalized understanding.

## 2021-05-09 NOTE — Telephone Encounter (Signed)
Patient c/o Palpitations:  High priority if patient c/o lightheadedness, shortness of breath, or chest pain  How long have you had palpitations/irregular HR/ Afib? Are you having the symptoms now? Has been in and out of rhythm since 12/8, yes  Are you currently experiencing lightheadedness, SOB or CP? A little bit of SOB  Do you have a history of afib (atrial fibrillation) or irregular heart rhythm? yes  Have you checked your BP or HR? (document readings if available): BP 136/78 HR 91  Are you experiencing any other symptoms? No   Patient's wife states the patient has been in and out of afib since he was seen 12/8. She states he was out of rhythm twice that week and then again last Friday. She says he was in rhythm this morning when he woke up, but now is out again. She states the patient is taking 100 mg of flecainide and wants to know if he can go back on the 150 mg, because that kept him in rhythm for 7 years. She says they had changed his medication in the hospital due to his kidneys.

## 2021-05-17 ENCOUNTER — Telehealth: Payer: Self-pay | Admitting: Internal Medicine

## 2021-05-17 MED ORDER — PROPAFENONE HCL 300 MG PO TABS: 300.0000 mg | ORAL_TABLET | Freq: Two times a day (BID) | ORAL | 11 refills | Status: DC

## 2021-05-17 NOTE — Telephone Encounter (Signed)
Pt c/o medication issue:  1. Name of Medication: dronedarone (MULTAQ) 400 MG tablet  2. How are you currently taking this medication (dosage and times per day)? Patient started taking 05/12/21  3. Are you having a reaction (difficulty breathing--STAT)? Vomiting, Diarrhea on the first day, Stomach pains since   4. What is your medication issue?  Patient has been uncomfortable since starting the medicine. He is not sure what to do

## 2021-05-17 NOTE — Telephone Encounter (Signed)
Discussed with Dr. Lovena Le.  Will DC Multaq and try propafenone 300  mg PO BID.  Pt advised to stop Multaq for 24 hours prior to starting propafenone.  Will call next week to see how Pt is doing and schedule EKG nurse visit.

## 2021-05-24 NOTE — Telephone Encounter (Signed)
Patient c/o Palpitations:  High priority if patient c/o lightheadedness, shortness of breath, or chest pain  How long have you had palpitations/irregular HR/ Afib? Are you having the symptoms now? Yes, started a new medicine (Propafenone)last Wednesday-  been in Afib every since  Are you currently experiencing lightheadedness, SOB or CP?  Sore all over his body  Do you have a history of afib (atrial fibrillation) or irregular heart rhythm? yes  Have you checked your BP or HR? (document readings if available):   Are you experiencing any other symptoms? no

## 2021-05-24 NOTE — Telephone Encounter (Signed)
Discussed with Dr. Lovena Le.  See Pt Friday 05/27/2021 to discuss amiodarone.  Pt's wife aware.  Appt scheduled.

## 2021-05-27 ENCOUNTER — Ambulatory Visit: Payer: Medicare Other | Admitting: Internal Medicine

## 2021-05-27 ENCOUNTER — Encounter: Payer: Self-pay | Admitting: Internal Medicine

## 2021-05-27 ENCOUNTER — Other Ambulatory Visit: Payer: Self-pay

## 2021-05-27 VITALS — BP 126/80 | HR 88 | Ht 71.0 in | Wt 215.6 lb

## 2021-05-27 DIAGNOSIS — I10 Essential (primary) hypertension: Secondary | ICD-10-CM | POA: Diagnosis not present

## 2021-05-27 DIAGNOSIS — R001 Bradycardia, unspecified: Secondary | ICD-10-CM | POA: Diagnosis not present

## 2021-05-27 DIAGNOSIS — I480 Paroxysmal atrial fibrillation: Secondary | ICD-10-CM

## 2021-05-27 MED ORDER — DIGOXIN 125 MCG PO TABS: ORAL_TABLET | ORAL | 3 refills | Status: DC

## 2021-05-27 MED ORDER — METOPROLOL SUCCINATE ER 25 MG PO TB24: 25.0000 mg | ORAL_TABLET | Freq: Every day | ORAL | 3 refills | Status: DC

## 2021-05-27 NOTE — Patient Instructions (Addendum)
Medication Instructions:  Your physician has recommended you make the following change in your medication:    STOP propafenone   2.    START taking metoprolol succinate 25 mg-  Take one tablet by mouth daily  3.   START taking digoxin 0.125 mg-  Take one tablet by mouth daily EXCEPT Sunday.  Labwork: None ordered.  Testing/Procedures: None ordered.  Follow-Up:  You will come to the [place] office in 2 weeks for an EKG and lab work. ON THE MORNING OF YOUR EKG-do NOT take your digoxin.  You will take it when you go back home.  Your physician wants you to follow-up in: 3 months with Oda Kilts PA.   Any Other Special Instructions Will Be Listed Below (If Applicable).  If you need a refill on your cardiac medications before your next appointment, please call your pharmacy.   Digoxin Tablets What is this medication? DIGOXIN (di JOX in) treats heart failure. It may also be used to treat a type of arrhythmia known as AFib (atrial fibrillation). It works by helping your heart beat stronger, making it easier for your heart to pump blood to the rest of the body. It also slows down overactive electric signals in the heart, which stabilizes your heart rhythm. This medicine may be used for other purposes; ask your health care provider or pharmacist if you have questions. COMMON BRAND NAME(S): Digitek, Lanoxicaps, Lanoxin What should I tell my care team before I take this medication? They need to know if you have any of these conditions: Certain heart rhythm disorders Heart disease or recent heart attack Kidney or liver disease An unusual or allergic reaction to digoxin, other medications, foods, dyes, or preservatives Pregnant or trying to get pregnant Breast-feeding How should I use this medication? Take this medication by mouth with a glass of water. Follow the directions on the prescription label. Take your doses at regular intervals. Do not take your medication more often than  directed. Talk to your care team about the use of this medication in children. Special care may be needed. Overdosage: If you think you have taken too much of this medicine contact a poison control center or emergency room at once. NOTE: This medicine is only for you. Do not share this medicine with others. What if I miss a dose? If you miss a dose, take it as soon as you can. If it is almost time for your next dose, take only that dose. Do not take double or extra doses. What may interact with this medication? Activated charcoal Albuterol Alprazolam Antacids Antiviral medications for HIV or AIDS like ritonavir and saquinavir Calcium Certain antibiotics like azithromycin, clarithromycin, erythromycin, gentamicin, neomycin, trimethoprim, and tetracycline Certain medications for blood pressure, heart disease, irregular heart beat Certain medications for cancer Certain medications for cholesterol like atorvastatin, cholestyramine, and colestipol Certain medications for diabetes, like acarbose, exenatide, miglitol, and metformin Certain medications for fungal infections like ketoconazole and itraconazole Certain medications for stomach problems like omeprazole, esomeprazole, lansoprazole, rabeprazole, metoclopramide, and sucralfate Conivaptan Cyclosporine Diphenoxylate Epinephrine Kaolin; pectin Nefazodone NSAIDS, medications for pain and inflammation, like celecoxib, ibuprofen, or naproxen Penicillamine Phenytoin Propantheline Quinine Phenytoin Rifampin Succinylcholine St. John's Wort Sulfasalazine Teriparatide Thyroid hormones Tolvaptan This list may not describe all possible interactions. Give your health care provider a list of all the medicines, herbs, non-prescription drugs, or dietary supplements you use. Also tell them if you smoke, drink alcohol, or use illegal drugs. Some items may interact with your medicine. What should  I watch for while using this medication? Visit  your care team for regular checks on your progress. Do not stop taking this medication without the advice of your care team, even if you feel better. Do not change the brand you are taking, other brands may affect you differently. Check your heart rate and blood pressure regularly while you are taking this medication. Ask your care team what your heart rate and blood pressure should be, and when you should contact him or her. Your care team also may schedule regular blood tests and electrocardiograms to check your progress. Watch your diet. Less digoxin may be absorbed from the stomach if you have a diet high in bran fiber. Do not treat yourself for coughs, colds or allergies without asking your care team for advice. Some ingredients can increase possible side effects. What side effects may I notice from receiving this medication? Side effects that you should report to your care team as soon as possible: Allergic reactions--skin rash, itching, hives, swelling of the face, lips, tongue, or throat Digoxin toxicity--confusion, loss of appetite, nausea, vomiting, diarrhea, change in vision such as blurry or yellow vision, fatigue, fast or irregular heartbeat Slow heartbeat--dizziness, feeling faint or lightheaded, confusion, trouble breathing, unusual weakness or fatigue Side effects that usually do not require medical attention (report to your care team if they continue or are bothersome): Dizziness Stomach pain Unexpected breast tissue growth This list may not describe all possible side effects. Call your doctor for medical advice about side effects. You may report side effects to FDA at 1-800-FDA-1088. Where should I keep my medication? Keep out of the reach of children. Store at room temperature between 15 and 30 degrees C (59 and 86 degrees F). Protect from light and moisture. Throw away any unused medication after the expiration date. NOTE: This sheet is a summary. It may not cover all possible  information. If you have questions about this medicine, talk to your doctor, pharmacist, or health care provider.  2022 Elsevier/Gold Standard (2020-08-15 00:00:00)

## 2021-05-27 NOTE — Progress Notes (Signed)
HPI [name] returns today for followup of his atrial fib. He is a pleasant 81 yo man with a h/o HTN and atrial fib. He has been tried on multiple medications. His flecainide became ineffective. He was placed on propafenone and has felt better but review of HR and BP suggest that he has not maintained NSR with propafenone. He does not have much in the way of palpitations. He denies chest pain. No edema Allergies  Allergen Reactions   Albuterol Sulfate [Albuterol] Other (See Comments)    rapid heartbeat; afib   Hyoscyamine Sulfate     Other reaction(s): urinary retention   Adhesive [Tape] Rash and Other (See Comments)    New heart monitor/patch (adhesive) broke out the skin and left a large red patch (still visible after 3 weeks   Warfarin And Related Other (See Comments)    Patient's INR could never get regulated while on this     Current Outpatient Medications  Medication Sig Dispense Refill   acetaminophen (TYLENOL) 500 MG tablet Take 500-1,000 mg by mouth every 6 (six) hours as needed (pain).     amLODipine (NORVASC) 5 MG tablet Take 1 tablet (5 mg total) by mouth daily. 90 tablet 3   Carboxymethylcellulose Sodium (THERATEARS) 0.25 % SOLN Place 1 drop into both eyes daily as needed (dry eyes).     COVID-19 mRNA Vac-TriS, Pfizer, SUSP injection USE AS DIRECTED .3 mL 0   donepezil (ARICEPT) 10 MG tablet Take 10 mg by mouth at bedtime.     ELIQUIS 5 MG TABS tablet TAKE 1 TABLET BY MOUTH TWICE A DAY (Patient taking differently: Take 5 mg by mouth 2 (two) times daily.) 60 tablet 11   FIBER PO Take 1 capsule by mouth daily.     finasteride (PROSCAR) 5 MG tablet Take 5 mg by mouth at bedtime.   12   INCRUSE ELLIPTA 62.5 MCG/INH AEPB Take 1 puff by mouth daily.  1   memantine (NAMENDA) 5 MG tablet Take 5 mg by mouth 2 (two) times daily.     Multiple Vitamins-Minerals (PRESERVISION AREDS 2+MULTI VIT PO) Take 1 capsule by mouth in the morning and at bedtime.     nitroGLYCERIN  (NITROSTAT) 0.4 MG SL tablet Place 1 tablet (0.4 mg total) under the tongue every 5 (five) minutes as needed for chest pain (MAX 3 TABLETS). 25 tablet 11   polyethylene glycol (MIRALAX / GLYCOLAX) 17 g packet Take 17 g by mouth daily as needed for mild constipation. 14 each 0   propafenone (RYTHMOL) 300 MG tablet Take 1 tablet (300 mg total) by mouth in the morning and at bedtime. 60 tablet 11   tamsulosin (FLOMAX) 0.4 MG CAPS capsule Take 1 capsule (0.4 mg total) by mouth daily after breakfast. 14 capsule 0   No current facility-administered medications for this visit.   Facility-Administered Medications Ordered in Other Visits  Medication Dose Route Frequency Provider Last Rate Last Admin   gadobenate dimeglumine (MULTIHANCE) injection 20 mL  20 mL Intravenous Once Radiologist, Medication, MD         Past Medical History:  Diagnosis Date   Arthritis    both knees   Atrial fibrillation ([place])    Paroxysmal, s/p PVI 11/11/10   Atrial flutter ([facility])    s/p RFCA  by GT   Basal cell carcinoma of ear    CAD (coronary artery disease)    a. cath 5/12: pLAD 25%, D1 25%, EF 65 % (done 2/2  abnormal myoview with inferapical  ischemia)   Dyslipidemia    Dysrhythmia    HTN (hypertension)    Hx of echocardiogram    Echo (9/15):  Mild LVH, EF 55-60%, no RWMA, normal diast function, mild MR, mod LAE, mildly increased pulmonary pressures (PASP 30 mmHg).   Hypertension    Shortness of breath dyspnea    has a low heart rate   Snoring    a. sleep study 11/2012 neg for OSA; increased leg movement suspicious for primary movement d/o (ie restless leg syndrome)    Stroke ([facility])    mild stroke with  second heart ablation   TIA (transient ischemic attack)    11/12/10 post afib ablation    ROS:   All systems reviewed and negative except as noted in the HPI.   Past Surgical History:  Procedure Laterality Date   atrial flutter ablation     by GT   basal cell cancer resection     CARDIOVERSION   10/31/2011   Procedure: CARDIOVERSION;  Surgeon: Jolaine Artist, MD;  Location: [facility] ENDOSCOPY;  Service: Cardiovascular;  Laterality: N/A;   CATARACT EXTRACTION, BILATERAL     COLONOSCOPY     EYE SURGERY     growth under lid- x2   HEMORRHOID SURGERY     KNEE ARTHROSCOPY Bilateral    bilateral   SHOULDER ARTHROSCOPY Right 08/04/2015   Procedure: ARTHROSCOPY RIGHT SHOULDER;  Surgeon: Frederik Pear, MD;  Location: [place];  Service: Orthopedics;  Laterality: Right;   TEE WITHOUT CARDIOVERSION  10/31/2011   Procedure: TRANSESOPHAGEAL ECHOCARDIOGRAM (TEE);  Surgeon: Jolaine Artist, MD;  Location: [facility] ENDOSCOPY;  Service: Cardiovascular;  Laterality: N/A;     Family History  Problem Relation Age of Onset   Hypertension Mother    Stroke Mother    Heart disease Mother    Arthritis Mother    Heart attack Neg Hx      Social History   Socioeconomic History   Marital status: Divorced    Spouse name: Not on file   Number of children: 1   Years of education: Not on file   Highest education level: Not on file  Occupational History   Occupation: Tax adviser for [employer]    Employer: RETIRED    Comment: Retired  Tobacco Use   Smoking status: Former    Packs/day: 1.50    Years: 20.00    Pack years: 30.00    Types: Cigarettes    Quit date: 05/23/1975    Years since quitting: 46.0   Smokeless tobacco: Never  Vaping Use   Vaping Use: Never used  Substance and Sexual Activity   Alcohol use: No    Alcohol/week: 14.0 standard drinks    Types: 14 Standard drinks or equivalent per week    Comment: has not had a drink in 9 months   Drug use: No   Sexual activity: Not on file  Other Topics Concern   Not on file  Social History Narrative   Not on file   Social Determinants of Health   Financial Resource Strain: Not on file  Food Insecurity: Not on file  Transportation Needs: Not on file  Physical Activity: Not on file  Stress: Not on file  Social Connections: Not on  file  Intimate Partner Violence: Not on file     BP 126/80    Pulse 88    Ht 5\' 11"  (1.803 m)    Wt 215 lb 9.6 oz (97.8 kg)  SpO2 96%    BMI 30.07 kg/m   Physical Exam:  Well appearing 81 yo man, NAD HEENT: Unremarkable Neck:  6 cm JVD, no thyromegally Lymphatics:  No adenopathy Back:  No CVA tenderness Lungs:  Clear HEART:  Regular rate rhythm, no murmurs, no rubs, no clicks Abd:  soft, positive bowel sounds, no organomegally, no rebound, no guarding Ext:  2 plus pulses, no edema, no cyanosis, no clubbing Skin:  No rashes no nodules Neuro:  CN II through XII intact, motor grossly intact  Assess/Plan:  Persistent atrial fib - I discussed dofetilide, amiodarone, rate control and he would like to try rate control. We will start low dose toprol and stop propafenone, and start digoxin. My hope is to uptitrate his medications in the future. HTN - his bp is well controlled today. We will follow. Coags - he will continue eliquis with no bleeding. Dementia -this seems to have stabilized. Continue namenda.  Erik Overlie Lio Wehrly,MD

## 2021-06-10 ENCOUNTER — Other Ambulatory Visit: Payer: Medicare Other | Admitting: *Deleted

## 2021-06-10 ENCOUNTER — Ambulatory Visit: Payer: Medicare Other | Admitting: *Deleted

## 2021-06-10 ENCOUNTER — Other Ambulatory Visit: Payer: Self-pay

## 2021-06-10 VITALS — HR 65 | Ht 71.0 in | Wt 215.0 lb

## 2021-06-10 DIAGNOSIS — R001 Bradycardia, unspecified: Secondary | ICD-10-CM

## 2021-06-10 DIAGNOSIS — Z79899 Other long term (current) drug therapy: Secondary | ICD-10-CM

## 2021-06-10 DIAGNOSIS — I480 Paroxysmal atrial fibrillation: Secondary | ICD-10-CM | POA: Diagnosis not present

## 2021-06-10 NOTE — Progress Notes (Signed)
Pt came in for EKG today after starting Digoxin per orders dr Lovena Le continue taking Digoxin as directed /cy

## 2021-06-11 LAB — DIGOXIN LEVEL: Digoxin, Serum: 0.6 ng/mL (ref 0.5–0.9)

## 2021-06-15 ENCOUNTER — Telehealth: Payer: Self-pay

## 2021-06-15 NOTE — Telephone Encounter (Signed)
Returned call to family.  Per wife Pt is feeling better on the new medication regimen!  Advised to call if any issues.

## 2021-06-15 NOTE — Telephone Encounter (Signed)
Lavonne was returning nurse call. Phone number is [phone].

## 2021-08-09 DIAGNOSIS — E559 Vitamin D deficiency, unspecified: Secondary | ICD-10-CM | POA: Diagnosis not present

## 2021-08-09 DIAGNOSIS — R413 Other amnesia: Secondary | ICD-10-CM | POA: Diagnosis not present

## 2021-08-09 DIAGNOSIS — R269 Unspecified abnormalities of gait and mobility: Secondary | ICD-10-CM | POA: Diagnosis not present

## 2021-08-09 DIAGNOSIS — G629 Polyneuropathy, unspecified: Secondary | ICD-10-CM | POA: Diagnosis not present

## 2021-08-09 DIAGNOSIS — E7800 Pure hypercholesterolemia, unspecified: Secondary | ICD-10-CM | POA: Diagnosis not present

## 2021-08-09 DIAGNOSIS — J449 Chronic obstructive pulmonary disease, unspecified: Secondary | ICD-10-CM | POA: Diagnosis not present

## 2021-08-09 DIAGNOSIS — I10 Essential (primary) hypertension: Secondary | ICD-10-CM | POA: Diagnosis not present

## 2021-08-10 ENCOUNTER — Ambulatory Visit: Payer: Medicare Other | Admitting: Internal Medicine

## 2021-08-25 NOTE — Progress Notes (Signed)
? ?PCP:  Alroy Dust, L.Marlou Sa, MD ?Primary Cardiologist: None ?Electrophysiologist: Cristopher Peru, MD  ? ?Erik [name] is a 81 y.o. male seen today for Cristopher Peru, MD for routine electrophysiology followup.  Since last being seen in our clinic the patient reports doing well overall. Rates are well controlled by notes kept by wife.  he denies chest pain, palpitations, dyspnea, PND, orthopnea, nausea, vomiting, dizziness, syncope, edema, weight gain, or early satiety. ? ?Past Medical History:  ?Diagnosis Date  ? Arthritis   ? both knees  ? Atrial fibrillation ([place])   ? Paroxysmal, s/p PVI 11/11/10  ? Atrial flutter ([place])   ? s/p RFCA  by GT  ? Basal cell carcinoma of ear   ? CAD (coronary artery disease)   ? a. cath 5/12: pLAD 25%, D1 25%, EF 65 % (done 2/2 abnormal myoview with inferapical  ischemia)  ? Dyslipidemia   ? Dysrhythmia   ? HTN (hypertension)   ? Hx of echocardiogram   ? Echo (9/15):  Mild LVH, EF 55-60%, no RWMA, normal diast function, mild MR, mod LAE, mildly increased pulmonary pressures (PASP 30 mmHg).  ? Hypertension   ? Shortness of breath dyspnea   ? has a low heart rate  ? Snoring   ? a. sleep study 11/2012 neg for OSA; increased leg movement suspicious for primary movement d/o (ie restless leg syndrome)   ? Stroke [facility])   ? mild stroke with  second heart ablation  ? TIA (transient ischemic attack)   ? 11/12/10 post afib ablation  ? ?Past Surgical History:  ?Procedure Laterality Date  ? atrial flutter ablation    ? by GT  ? basal cell cancer resection    ? CARDIOVERSION  10/31/2011  ? Procedure: CARDIOVERSION;  Surgeon: Jolaine Artist, MD;  Location: [facility] ENDOSCOPY;  Service: Cardiovascular;  Laterality: N/A;  ? CATARACT EXTRACTION, BILATERAL    ? COLONOSCOPY    ? EYE SURGERY    ? growth under lid- x2  ? HEMORRHOID SURGERY    ? KNEE ARTHROSCOPY Bilateral   ? bilateral  ? SHOULDER ARTHROSCOPY Right 08/04/2015  ? Procedure: ARTHROSCOPY RIGHT SHOULDER;  Surgeon: Frederik Pear, MD;  Location: [place];   Service: Orthopedics;  Laterality: Right;  ? TEE WITHOUT CARDIOVERSION  10/31/2011  ? Procedure: TRANSESOPHAGEAL ECHOCARDIOGRAM (TEE);  Surgeon: Jolaine Artist, MD;  Location: [facility] ENDOSCOPY;  Service: Cardiovascular;  Laterality: N/A;  ? ? ?Current Outpatient Medications  ?Medication Sig Dispense Refill  ? acetaminophen (TYLENOL) 500 MG tablet Take 500-1,000 mg by mouth every 6 (six) hours as needed (pain).    ? amLODipine (NORVASC) 5 MG tablet Take 1 tablet (5 mg total) by mouth daily. 90 tablet 3  ? Carboxymethylcellulose Sodium (THERATEARS) 0.25 % SOLN Place 1 drop into both eyes daily as needed (dry eyes).    ? Cholecalciferol (VITAMIN D3) 50 MCG (2000 UT) TABS Take by mouth.    ? digoxin (LANOXIN) 0.125 MG tablet Take one tablet by mouth daily EXCEPT on Sunday. 90 tablet 3  ? donepezil (ARICEPT) 10 MG tablet Take 10 mg by mouth at bedtime.    ? ELIQUIS 5 MG TABS tablet TAKE 1 TABLET BY MOUTH TWICE A DAY (Patient taking differently: Take 5 mg by mouth 2 (two) times daily.) 60 tablet 11  ? FIBER PO Take 1 capsule by mouth daily.    ? finasteride (PROSCAR) 5 MG tablet Take 5 mg by mouth at bedtime.   12  ? INCRUSE ELLIPTA 62.5  MCG/INH AEPB Take 1 puff by mouth daily.  1  ? memantine (NAMENDA) 5 MG tablet Take 5 mg by mouth 2 (two) times daily.    ? metoprolol succinate (TOPROL XL) 25 MG 24 hr tablet Take 1 tablet (25 mg total) by mouth daily. 90 tablet 3  ? Multiple Vitamins-Minerals (PRESERVISION AREDS 2+MULTI VIT PO) Take 1 capsule by mouth in the morning and at bedtime.    ? nitroGLYCERIN (NITROSTAT) 0.4 MG SL tablet Place 1 tablet (0.4 mg total) under the tongue every 5 (five) minutes as needed for chest pain (MAX 3 TABLETS). 25 tablet 11  ? polyethylene glycol (MIRALAX / GLYCOLAX) 17 g packet Take 17 g by mouth daily as needed for mild constipation. 14 each 0  ? tamsulosin (FLOMAX) 0.4 MG CAPS capsule Take 1 capsule (0.4 mg total) by mouth daily after breakfast. 14 capsule 0  ? ?No current  facility-administered medications for this visit.  ? ?Facility-Administered Medications Ordered in Other Visits  ?Medication Dose Route Frequency Provider Last Rate Last Admin  ? gadobenate dimeglumine (MULTIHANCE) injection 20 mL  20 mL Intravenous Once Radiologist, Medication, MD      ? ? ?Allergies  ?Allergen Reactions  ? Albuterol Sulfate [Albuterol] Other (See Comments)  ?  rapid heartbeat; afib  ? Hyoscyamine Sulfate   ?  Other reaction(s): urinary retention  ? Adhesive [Tape] Rash and Other (See Comments)  ?  New heart monitor/patch (adhesive) broke out the skin and left a large red patch (still visible after 3 weeks  ? Warfarin And Related Other (See Comments)  ?  Patient's INR could never get regulated while on this  ? ? ?Social History  ? ?Socioeconomic History  ? Marital status: Divorced  ?  Spouse name: Not on file  ? Number of children: 1  ? Years of education: Not on file  ? Highest education level: Not on file  ?Occupational History  ? Occupation: Tax adviser for [employer]  ?  Employer: RETIRED  ?  Comment: Retired  ?Tobacco Use  ? Smoking status: Former  ?  Packs/day: 1.50  ?  Years: 20.00  ?  Pack years: 30.00  ?  Types: Cigarettes  ?  Quit date: 05/23/1975  ?  Years since quitting: 46.3  ? Smokeless tobacco: Never  ?Vaping Use  ? Vaping Use: Never used  ?Substance and Sexual Activity  ? Alcohol use: No  ?  Alcohol/week: 14.0 standard drinks  ?  Types: 14 Standard drinks or equivalent per week  ?  Comment: has not had a drink in 9 months  ? Drug use: No  ? Sexual activity: Not on file  ?Other Topics Concern  ? Not on file  ?Social History Narrative  ? Not on file  ? ?Social Determinants of Health  ? ?Financial Resource Strain: Not on file  ?Food Insecurity: Not on file  ?Transportation Needs: Not on file  ?Physical Activity: Not on file  ?Stress: Not on file  ?Social Connections: Not on file  ?Intimate Partner Violence: Not on file  ? ? ? ?Review of Systems: ?All other systems reviewed and  are otherwise negative except as noted above. ? ?Physical Exam: ?Vitals:  ? 08/30/21 1004  ?BP: 130/80  ?Pulse: 77  ?SpO2: 96%  ?Weight: 217 lb 12.8 oz (98.8 kg)  ?Height: '5\' 11"'$  (1.803 m)  ? ? ?GEN- The patient is well appearing, alert and oriented x 3 today.   ?HEENT: normocephalic, atraumatic; sclera clear, conjunctiva pink; hearing  intact; oropharynx clear; neck supple, no JVP ?Lymph- no cervical lymphadenopathy ?Lungs- Clear to ausculation bilaterally, normal work of breathing.  No wheezes, rales, rhonchi ?Heart- Irregularly irregular rate and rhythm, no murmurs, rubs or gallops, PMI not laterally displaced ?GI- soft, non-tender, non-distended, bowel sounds present, no hepatosplenomegaly ?Extremities- no clubbing, cyanosis, or edema; DP/PT/radial pulses 2+ bilaterally ?MS- no significant deformity or atrophy ?Skin- warm and dry, no rash or lesion ?Psych- euthymic mood, full affect ?Neuro- strength and sensation are intact ? ?EKG is ordered. Personal review of EKG from today shows atrial fibrillation at 77 bpm ? ?Additional studies reviewed include: ?Previous EP office notes.  ? ?Assessment and Plan: ? ?Persistent atrial fib -  ?EKG today shows rate controlled AF ?Pt prefers rate control ?Continue toprol 25 mg daily.  ?Continue digoxin, level Thursday, as he has already taken today.  ?Continue eliquis. Labs Thursday.  ? ?2. HTN ?Stable on current regimen  ? ?3. Dementia  ?Relatively stable on Namenda.  ? ?Follow up with Dr. Lovena Le in 6 months  ? ?Shirley Friar, PA-C  ?08/30/21 ?10:12 AM ? ?

## 2021-08-30 ENCOUNTER — Encounter: Payer: Self-pay | Admitting: Student

## 2021-08-30 ENCOUNTER — Ambulatory Visit: Payer: Medicare Other | Admitting: Student

## 2021-08-30 VITALS — BP 130/80 | HR 77 | Ht 71.0 in | Wt 217.8 lb

## 2021-08-30 DIAGNOSIS — I10 Essential (primary) hypertension: Secondary | ICD-10-CM

## 2021-08-30 DIAGNOSIS — I480 Paroxysmal atrial fibrillation: Secondary | ICD-10-CM | POA: Diagnosis not present

## 2021-08-30 NOTE — Patient Instructions (Signed)
Medication Instructions:  ?Your physician recommends that you continue on your current medications as directed. Please refer to the Current Medication list given to you today. ? ?*If you need a refill on your cardiac medications before your next appointment, please call your pharmacy* ? ? ?Lab Work: ?BMET, CBC, Digoxin Level on Thursday 09/01/21 anytime between 7:15am-4:30pm ? ?If you have labs (blood work) drawn today and your tests are completely normal, you will receive your results only by: ?MyChart Message (if you have MyChart) OR ?A paper copy in the mail ?If you have any lab test that is abnormal or we need to change your treatment, we will call you to review the results. ? ? ?Follow-Up: ?At [facility], you and your health needs are our priority.  As part of our continuing mission to provide you with exceptional heart care, we have created designated Provider Care Teams.  These Care Teams include your primary Cardiologist (physician) and Advanced Practice Providers (APPs -  Physician Assistants and Nurse Practitioners) who all work together to provide you with the care you need, when you need it. ? ? ?Your next appointment:   ?6 month(s) ? ?The format for your next appointment:   ?In Person ? ?Provider:   ?Cristopher Peru, MD  ? ? ?Important Information About Sugar ? ? ? ? ?  ?

## 2021-09-01 ENCOUNTER — Other Ambulatory Visit: Payer: Medicare Other

## 2021-09-01 DIAGNOSIS — I480 Paroxysmal atrial fibrillation: Secondary | ICD-10-CM | POA: Diagnosis not present

## 2021-09-01 DIAGNOSIS — I10 Essential (primary) hypertension: Secondary | ICD-10-CM | POA: Diagnosis not present

## 2021-09-02 LAB — CBC
Hematocrit: 44.7 % (ref 37.5–51.0)
Hemoglobin: 15 g/dL (ref 13.0–17.7)
MCH: 31.1 pg (ref 26.6–33.0)
MCHC: 33.6 g/dL (ref 31.5–35.7)
MCV: 93 fL (ref 79–97)
Platelets: 255 10*3/uL (ref 150–450)
RBC: 4.82 x10E6/uL (ref 4.14–5.80)
RDW: 12.8 % (ref 11.6–15.4)
WBC: 9.2 10*3/uL (ref 3.4–10.8)

## 2021-09-02 LAB — BASIC METABOLIC PANEL
BUN/Creatinine Ratio: 16 (ref 10–24)
BUN: 19 mg/dL (ref 8–27)
CO2: 24 mmol/L (ref 20–29)
Calcium: 10 mg/dL (ref 8.6–10.2)
Chloride: 105 mmol/L (ref 96–106)
Creatinine, Ser: 1.16 mg/dL (ref 0.76–1.27)
Glucose: 85 mg/dL (ref 70–99)
Potassium: 4.5 mmol/L (ref 3.5–5.2)
Sodium: 144 mmol/L (ref 134–144)
eGFR: 64 mL/min/{1.73_m2} (ref >59)

## 2021-09-02 LAB — DIGOXIN LEVEL: Digoxin, Serum: 0.6 ng/mL (ref 0.5–0.9)

## 2021-09-15 DIAGNOSIS — Z85828 Personal history of other malignant neoplasm of skin: Secondary | ICD-10-CM | POA: Diagnosis not present

## 2021-09-15 DIAGNOSIS — D1801 Hemangioma of skin and subcutaneous tissue: Secondary | ICD-10-CM | POA: Diagnosis not present

## 2021-09-15 DIAGNOSIS — C44519 Basal cell carcinoma of skin of other part of trunk: Secondary | ICD-10-CM | POA: Diagnosis not present

## 2021-09-15 DIAGNOSIS — D225 Melanocytic nevi of trunk: Secondary | ICD-10-CM | POA: Diagnosis not present

## 2021-09-15 DIAGNOSIS — L821 Other seborrheic keratosis: Secondary | ICD-10-CM | POA: Diagnosis not present

## 2021-09-15 DIAGNOSIS — L570 Actinic keratosis: Secondary | ICD-10-CM | POA: Diagnosis not present

## 2021-10-19 ENCOUNTER — Telehealth: Payer: Self-pay | Admitting: Internal Medicine

## 2021-10-19 NOTE — Telephone Encounter (Signed)
  Pt c/o medication issue:  1. Name of Medication:   ELIQUIS 5 MG TABS tablet    2. How are you currently taking this medication (dosage and times per day)? TAKE 1 TABLET BY MOUTH TWICE A DAYPatient taking differently: Take 5 mg by mouth 2 (two) times daily.  3. Are you having a reaction (difficulty breathing--STAT)?   4. What is your medication issue? [name] called, she said, pt has had 3 nose bleeds in the last month and a half, the recent one was last night. The pt is getting concerned and wanted to get Dr. Tanna Furry recommendations

## 2021-10-19 NOTE — Telephone Encounter (Signed)
Returned call to [facility].  Advised that Pt can try a saline nasal spray to lubricate nostrils.  Can also use Afrin PRN.  She will also call PCP to get a referral to an ENT to evaluate.  She states Pt blows his nose hard, and this could also be causing the issue.  All questions answered.  Await further needs.

## 2021-11-02 ENCOUNTER — Ambulatory Visit: Payer: Medicare Other | Admitting: Internal Medicine

## 2021-12-20 ENCOUNTER — Ambulatory Visit
Admission: RE | Admit: 2021-12-20 | Discharge: 2021-12-20 | Disposition: A | Discharge status: Home / Self Care | Payer: Medicare Other | Source: Ambulatory Visit | Attending: Physician Assistant | Admitting: Physician Assistant

## 2021-12-20 ENCOUNTER — Other Ambulatory Visit: Payer: Self-pay | Admitting: Physician Assistant

## 2021-12-20 DIAGNOSIS — M549 Dorsalgia, unspecified: Secondary | ICD-10-CM | POA: Diagnosis not present

## 2021-12-20 DIAGNOSIS — M25551 Pain in right hip: Secondary | ICD-10-CM | POA: Diagnosis not present

## 2021-12-20 DIAGNOSIS — M25552 Pain in left hip: Secondary | ICD-10-CM

## 2021-12-20 DIAGNOSIS — R413 Other amnesia: Secondary | ICD-10-CM | POA: Diagnosis not present

## 2021-12-21 ENCOUNTER — Telehealth: Payer: Self-pay | Admitting: Internal Medicine

## 2021-12-21 NOTE — Telephone Encounter (Signed)
Pt c/o medication issue:  1. Name of Medication: benadryl  2. How are you currently taking this medication (dosage and times per day)? Has not taken it yet  3. Are you having a reaction (difficulty breathing--STAT)? no  4. What is your medication issue? Patient's wife calling to see if the patient can take benadryl for his yellow jacket stings.

## 2021-12-21 NOTE — Telephone Encounter (Signed)
Spoke with the patient's wife and advised that it would be okay for the patient to take Benadryl. She verbalized understanding.

## 2021-12-21 NOTE — Telephone Encounter (Signed)
Yes, however he should be aware it can cause drowsiness and can be drying

## 2021-12-22 DIAGNOSIS — R040 Epistaxis: Secondary | ICD-10-CM | POA: Diagnosis not present

## 2022-01-31 DIAGNOSIS — R3914 Feeling of incomplete bladder emptying: Secondary | ICD-10-CM | POA: Diagnosis not present

## 2022-01-31 DIAGNOSIS — R350 Frequency of micturition: Secondary | ICD-10-CM | POA: Diagnosis not present

## 2022-02-08 DIAGNOSIS — Z23 Encounter for immunization: Secondary | ICD-10-CM | POA: Diagnosis not present

## 2022-02-08 DIAGNOSIS — E559 Vitamin D deficiency, unspecified: Secondary | ICD-10-CM | POA: Diagnosis not present

## 2022-02-08 DIAGNOSIS — E7800 Pure hypercholesterolemia, unspecified: Secondary | ICD-10-CM | POA: Diagnosis not present

## 2022-02-08 DIAGNOSIS — R413 Other amnesia: Secondary | ICD-10-CM | POA: Diagnosis not present

## 2022-02-08 DIAGNOSIS — J449 Chronic obstructive pulmonary disease, unspecified: Secondary | ICD-10-CM | POA: Diagnosis not present

## 2022-02-08 DIAGNOSIS — R269 Unspecified abnormalities of gait and mobility: Secondary | ICD-10-CM | POA: Diagnosis not present

## 2022-02-08 DIAGNOSIS — G629 Polyneuropathy, unspecified: Secondary | ICD-10-CM | POA: Diagnosis not present

## 2022-02-08 DIAGNOSIS — I10 Essential (primary) hypertension: Secondary | ICD-10-CM | POA: Diagnosis not present

## 2022-03-21 DIAGNOSIS — B078 Other viral warts: Secondary | ICD-10-CM | POA: Diagnosis not present

## 2022-03-21 DIAGNOSIS — D0421 Carcinoma in situ of skin of right ear and external auricular canal: Secondary | ICD-10-CM | POA: Diagnosis not present

## 2022-03-21 DIAGNOSIS — L821 Other seborrheic keratosis: Secondary | ICD-10-CM | POA: Diagnosis not present

## 2022-03-21 DIAGNOSIS — D225 Melanocytic nevi of trunk: Secondary | ICD-10-CM | POA: Diagnosis not present

## 2022-03-21 DIAGNOSIS — Z85828 Personal history of other malignant neoplasm of skin: Secondary | ICD-10-CM | POA: Diagnosis not present

## 2022-03-21 DIAGNOSIS — L570 Actinic keratosis: Secondary | ICD-10-CM | POA: Diagnosis not present

## 2022-03-21 DIAGNOSIS — D1801 Hemangioma of skin and subcutaneous tissue: Secondary | ICD-10-CM | POA: Diagnosis not present

## 2022-04-04 DIAGNOSIS — R109 Unspecified abdominal pain: Secondary | ICD-10-CM | POA: Diagnosis not present

## 2022-04-04 DIAGNOSIS — R609 Edema, unspecified: Secondary | ICD-10-CM | POA: Diagnosis not present

## 2022-04-07 ENCOUNTER — Ambulatory Visit: Payer: Medicare Other | Attending: Internal Medicine | Admitting: Internal Medicine

## 2022-04-07 ENCOUNTER — Encounter: Payer: Self-pay | Admitting: Internal Medicine

## 2022-04-07 VITALS — BP 126/76 | HR 62 | Ht 71.0 in | Wt 215.8 lb

## 2022-04-07 DIAGNOSIS — I10 Essential (primary) hypertension: Secondary | ICD-10-CM

## 2022-04-07 DIAGNOSIS — I4891 Unspecified atrial fibrillation: Secondary | ICD-10-CM | POA: Diagnosis not present

## 2022-04-07 NOTE — Progress Notes (Signed)
HPI  [name] returns today for followup of his atrial fib. He is a pleasant 81 yo man with a h/o HTN and atrial fib. He has been tried on multiple medications. His flecainide became ineffective. He was placed on propafenone and has felt better but review of HR and BP suggest that he has not maintained NSR with propafenone. He does not have much in the way of palpitations. He denies chest pain. No edema     Current Outpatient Medications  Medication Sig Dispense Refill   acetaminophen (TYLENOL) 500 MG tablet Take 500-1,000 mg by mouth every 6 (six) hours as needed (pain).     amLODipine (NORVASC) 5 MG tablet Take 1 tablet (5 mg total) by mouth daily. 90 tablet 3   Carboxymethylcellulose Sodium (THERATEARS) 0.25 % SOLN Place 1 drop into both eyes daily as needed (dry eyes).     Cholecalciferol (VITAMIN D3) 50 MCG (2000 UT) TABS Take by mouth.     digoxin (LANOXIN) 0.125 MG tablet Take one tablet by mouth daily EXCEPT on Sunday. 90 tablet 3   donepezil (ARICEPT) 10 MG tablet Take 10 mg by mouth at bedtime.     ELIQUIS 5 MG TABS tablet TAKE 1 TABLET BY MOUTH TWICE A DAY (Patient taking differently: Take 5 mg by mouth 2 (two) times daily.) 60 tablet 11   FIBER PO Take 1 capsule by mouth daily.     finasteride (PROSCAR) 5 MG tablet Take 5 mg by mouth at bedtime.   12   furosemide (LASIX) 20 MG tablet Take 20 mg by mouth daily as needed.     INCRUSE ELLIPTA 62.5 MCG/INH AEPB Take 1 puff by mouth daily.  1   memantine (NAMENDA) 5 MG tablet Take 5 mg by mouth 2 (two) times daily.     metoprolol succinate (TOPROL XL) 25 MG 24 hr tablet Take 1 tablet (25 mg total) by mouth daily. 90 tablet 3   Multiple Vitamins-Minerals (PRESERVISION AREDS 2+MULTI VIT PO) Take 1 capsule by mouth in the morning and at bedtime.     nitroGLYCERIN (NITROSTAT) 0.4 MG SL tablet Place 1 tablet (0.4 mg total) under the tongue every 5 (five) minutes as needed for chest pain (MAX 3 TABLETS). 25 tablet 11    polyethylene glycol (MIRALAX / GLYCOLAX) 17 g packet Take 17 g by mouth daily as needed for mild constipation. 14 each 0   tamsulosin (FLOMAX) 0.4 MG CAPS capsule Take 1 capsule (0.4 mg total) by mouth daily after breakfast. 14 capsule 0   No current facility-administered medications for this visit.   Facility-Administered Medications Ordered in Other Visits  Medication Dose Route Frequency Provider Last Rate Last Admin   gadobenate dimeglumine (MULTIHANCE) injection 20 mL  20 mL Intravenous Once Radiologist, Medication, MD         Past Medical History:  Diagnosis Date   Arthritis    both knees   Atrial fibrillation (HCC)    Paroxysmal, s/p PVI 11/11/10   Atrial flutter (HCC)    s/p RFCA  by GT   Basal cell carcinoma of ear    CAD (coronary artery disease)    a. cath 5/12: pLAD 25%, D1 25%, EF 65 % (done 2/2 abnormal myoview with inferapical  ischemia)   Dyslipidemia    Dysrhythmia    HTN (hypertension)    Hx of echocardiogram    Echo (9/15):  Mild LVH, EF 55-60%, no RWMA, normal diast function, mild MR, mod LAE, mildly  increased pulmonary pressures (PASP 30 mmHg).   Hypertension    Shortness of breath dyspnea    has a low heart rate   Snoring    a. sleep study 11/2012 neg for OSA; increased leg movement suspicious for primary movement d/o (ie restless leg syndrome)    Stroke (HCC)    mild stroke with  second heart ablation   TIA (transient ischemic attack)    11/12/10 post afib ablation    ROS:   All systems reviewed and negative except as noted in the HPI.   Past Surgical History:  Procedure Laterality Date   atrial flutter ablation     by GT   basal cell cancer resection     CARDIOVERSION  10/31/2011   Procedure: CARDIOVERSION;  Surgeon: Jolaine Artist, MD;  Location: [facility] ENDOSCOPY;  Service: Cardiovascular;  Laterality: N/A;   CATARACT EXTRACTION, BILATERAL     COLONOSCOPY     EYE SURGERY     growth under lid- x2   HEMORRHOID SURGERY     KNEE ARTHROSCOPY  Bilateral    bilateral   SHOULDER ARTHROSCOPY Right 08/04/2015   Procedure: ARTHROSCOPY RIGHT SHOULDER;  Surgeon: Frederik Pear, MD;  Location: [place];  Service: Orthopedics;  Laterality: Right;   TEE WITHOUT CARDIOVERSION  10/31/2011   Procedure: TRANSESOPHAGEAL ECHOCARDIOGRAM (TEE);  Surgeon: Jolaine Artist, MD;  Location: [facility] ENDOSCOPY;  Service: Cardiovascular;  Laterality: N/A;     Family History  Problem Relation Age of Onset   Hypertension Mother    Stroke Mother    Heart disease Mother    Arthritis Mother    Heart attack Neg Hx      Social History   Socioeconomic History   Marital status: Divorced    Spouse name: Not on file   Number of children: 1   Years of education: Not on file   Highest education level: Not on file  Occupational History   Occupation: Tax adviser for [employer]    Employer: RETIRED    Comment: Retired  Tobacco Use   Smoking status: Former    Packs/day: 1.50    Years: 20.00    Total pack years: 30.00    Types: Cigarettes    Quit date: 05/23/1975    Years since quitting: 46.9   Smokeless tobacco: Never  Vaping Use   Vaping Use: Never used  Substance and Sexual Activity   Alcohol use: No    Alcohol/week: 14.0 standard drinks of alcohol    Types: 14 Standard drinks or equivalent per week    Comment: has not had a drink in 9 months   Drug use: No   Sexual activity: Not on file  Other Topics Concern   Not on file  Social History Narrative   Not on file   Social Determinants of Health   Financial Resource Strain: Not on file  Food Insecurity: Not on file  Transportation Needs: Not on file  Physical Activity: Not on file  Stress: Not on file  Social Connections: Not on file  Intimate Partner Violence: Not on file     BP 126/76   Pulse 62   Ht '5\' 11"'$  (1.803 m)   Wt 215 lb 12.8 oz (97.9 kg)   SpO2 97%   BMI 30.10 kg/m   Physical Exam:  Well appearing NAD HEENT: Unremarkable Neck:  No JVD, no  thyromegally Lymphatics:  No adenopathy Back:  No CVA tenderness Lungs:  Clear HEART:  Regular rate rhythm, no murmurs, no  rubs, no clicks Abd:  soft, positive bowel sounds, no organomegally, no rebound, no guarding Ext:  2 plus pulses, no edema, no cyanosis, no clubbing Skin:  No rashes no nodules Neuro:  CN II through XII intact, motor grossly intact  EKG  DEVICE  Normal device function.  See PaceArt for details.   Assess/Plan:  Persistent atrial fib - I discussed the treatment options and he would like to continue with rate control. We will continue toprol. His VR appears to be controlled. HTN - his bp is well controlled today. We will follow. Coags - he will continue eliquis with no bleeding. Dementia -this seems to have stabilized. Continue namenda.   Erik Overlie Chaselyn Nanney,MD

## 2022-04-07 NOTE — Patient Instructions (Signed)

## 2022-05-18 ENCOUNTER — Other Ambulatory Visit: Payer: Self-pay | Admitting: Internal Medicine

## 2022-06-05 ENCOUNTER — Other Ambulatory Visit: Payer: Self-pay

## 2022-06-05 MED ORDER — DIGOXIN 125 MCG PO TABS: ORAL_TABLET | ORAL | 3 refills | Status: DC

## 2022-07-24 DIAGNOSIS — J449 Chronic obstructive pulmonary disease, unspecified: Secondary | ICD-10-CM | POA: Diagnosis not present

## 2022-07-24 DIAGNOSIS — K5900 Constipation, unspecified: Secondary | ICD-10-CM | POA: Diagnosis not present

## 2022-07-24 DIAGNOSIS — R350 Frequency of micturition: Secondary | ICD-10-CM | POA: Diagnosis not present

## 2022-08-08 DIAGNOSIS — E559 Vitamin D deficiency, unspecified: Secondary | ICD-10-CM | POA: Diagnosis not present

## 2022-08-08 DIAGNOSIS — G629 Polyneuropathy, unspecified: Secondary | ICD-10-CM | POA: Diagnosis not present

## 2022-08-08 DIAGNOSIS — J449 Chronic obstructive pulmonary disease, unspecified: Secondary | ICD-10-CM | POA: Diagnosis not present

## 2022-08-08 DIAGNOSIS — I10 Essential (primary) hypertension: Secondary | ICD-10-CM | POA: Diagnosis not present

## 2022-08-08 DIAGNOSIS — D6869 Other thrombophilia: Secondary | ICD-10-CM | POA: Diagnosis not present

## 2022-08-08 DIAGNOSIS — R269 Unspecified abnormalities of gait and mobility: Secondary | ICD-10-CM | POA: Diagnosis not present

## 2022-08-08 DIAGNOSIS — K5900 Constipation, unspecified: Secondary | ICD-10-CM | POA: Diagnosis not present

## 2022-08-08 DIAGNOSIS — E7800 Pure hypercholesterolemia, unspecified: Secondary | ICD-10-CM | POA: Diagnosis not present

## 2022-08-08 DIAGNOSIS — I480 Paroxysmal atrial fibrillation: Secondary | ICD-10-CM | POA: Diagnosis not present

## 2022-08-16 ENCOUNTER — Telehealth: Payer: Self-pay | Admitting: Internal Medicine

## 2022-08-16 NOTE — Telephone Encounter (Signed)
Pt spouse wanted to know if Pt is ok to use Ipratropium 0.03 / Atrovent for his runny nose?  [facility] MD wanted Dr Lovena Le to look at his med list, and make sure this medicine does not interfere with any cardiac meds. Will forward to Dr. Lovena Le for review, and follow up with Pt.

## 2022-08-16 NOTE — Telephone Encounter (Signed)
Pt c/o medication issue:  1. Name of Medication: Ipratropium 0.03  2. How are you currently taking this medication (dosage and times per day)?   3. Are you having a reaction (difficulty breathing--STAT)? No  4. What is your medication issue? Pt went to see his PCP because his nose is constantly running, pt's wife stated that he was prescribed to use this nasal spray, but she is concerned about if he is allowed to use this medication. Please advise

## 2022-08-22 NOTE — Telephone Encounter (Signed)
Pt spouse called and told per Dr. Lovena Le, that Ipratropium is ok to take with the medications he is currently on .  Ms. Loletha Grayer appreciated the call back.  Pt had no other concerns at this time.

## 2022-08-22 NOTE — Telephone Encounter (Signed)
    Pt's wife is calling to follow up 

## 2022-09-19 DIAGNOSIS — C44319 Basal cell carcinoma of skin of other parts of face: Secondary | ICD-10-CM | POA: Diagnosis not present

## 2022-09-19 DIAGNOSIS — Z85828 Personal history of other malignant neoplasm of skin: Secondary | ICD-10-CM | POA: Diagnosis not present

## 2022-09-19 DIAGNOSIS — L821 Other seborrheic keratosis: Secondary | ICD-10-CM | POA: Diagnosis not present

## 2022-09-19 DIAGNOSIS — L570 Actinic keratosis: Secondary | ICD-10-CM | POA: Diagnosis not present

## 2022-09-19 DIAGNOSIS — D1801 Hemangioma of skin and subcutaneous tissue: Secondary | ICD-10-CM | POA: Diagnosis not present

## 2022-09-19 DIAGNOSIS — D225 Melanocytic nevi of trunk: Secondary | ICD-10-CM | POA: Diagnosis not present

## 2022-09-19 DIAGNOSIS — D485 Neoplasm of uncertain behavior of skin: Secondary | ICD-10-CM | POA: Diagnosis not present

## 2022-09-19 DIAGNOSIS — L814 Other melanin hyperpigmentation: Secondary | ICD-10-CM | POA: Diagnosis not present

## 2022-09-19 DIAGNOSIS — L820 Inflamed seborrheic keratosis: Secondary | ICD-10-CM | POA: Diagnosis not present
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
## 2022-11-14 DIAGNOSIS — L570 Actinic keratosis: Secondary | ICD-10-CM | POA: Diagnosis not present

## 2022-11-14 DIAGNOSIS — C44319 Basal cell carcinoma of skin of other parts of face: Secondary | ICD-10-CM | POA: Diagnosis not present

## 2023-01-31 DIAGNOSIS — R3912 Poor urinary stream: Secondary | ICD-10-CM | POA: Diagnosis not present

## 2023-01-31 DIAGNOSIS — R350 Frequency of micturition: Secondary | ICD-10-CM | POA: Diagnosis not present

## 2023-01-31 DIAGNOSIS — R3914 Feeling of incomplete bladder emptying: Secondary | ICD-10-CM | POA: Diagnosis not present

## 2023-02-14 DIAGNOSIS — R269 Unspecified abnormalities of gait and mobility: Secondary | ICD-10-CM | POA: Diagnosis not present

## 2023-02-14 DIAGNOSIS — I10 Essential (primary) hypertension: Secondary | ICD-10-CM | POA: Diagnosis not present

## 2023-02-14 DIAGNOSIS — J449 Chronic obstructive pulmonary disease, unspecified: Secondary | ICD-10-CM | POA: Diagnosis not present

## 2023-02-14 DIAGNOSIS — E559 Vitamin D deficiency, unspecified: Secondary | ICD-10-CM | POA: Diagnosis not present

## 2023-02-14 DIAGNOSIS — G629 Polyneuropathy, unspecified: Secondary | ICD-10-CM | POA: Diagnosis not present

## 2023-02-14 DIAGNOSIS — D6869 Other thrombophilia: Secondary | ICD-10-CM | POA: Diagnosis not present

## 2023-02-14 DIAGNOSIS — Z23 Encounter for immunization: Secondary | ICD-10-CM | POA: Diagnosis not present

## 2023-02-14 DIAGNOSIS — I480 Paroxysmal atrial fibrillation: Secondary | ICD-10-CM | POA: Diagnosis not present

## 2023-02-14 DIAGNOSIS — Z0000 Encounter for general adult medical examination without abnormal findings: Secondary | ICD-10-CM | POA: Diagnosis not present

## 2023-02-23 ENCOUNTER — Other Ambulatory Visit: Payer: Self-pay | Admitting: Internal Medicine

## 2023-03-22 DIAGNOSIS — L821 Other seborrheic keratosis: Secondary | ICD-10-CM | POA: Diagnosis not present

## 2023-03-22 DIAGNOSIS — D485 Neoplasm of uncertain behavior of skin: Secondary | ICD-10-CM | POA: Diagnosis not present

## 2023-03-22 DIAGNOSIS — D225 Melanocytic nevi of trunk: Secondary | ICD-10-CM | POA: Diagnosis not present

## 2023-03-22 DIAGNOSIS — D1801 Hemangioma of skin and subcutaneous tissue: Secondary | ICD-10-CM | POA: Diagnosis not present

## 2023-03-22 DIAGNOSIS — L570 Actinic keratosis: Secondary | ICD-10-CM | POA: Diagnosis not present

## 2023-03-22 DIAGNOSIS — Z85828 Personal history of other malignant neoplasm of skin: Secondary | ICD-10-CM | POA: Diagnosis not present

## 2023-04-10 DIAGNOSIS — H35363 Drusen (degenerative) of macula, bilateral: Secondary | ICD-10-CM | POA: Diagnosis not present

## 2023-04-10 DIAGNOSIS — H524 Presbyopia: Secondary | ICD-10-CM | POA: Diagnosis not present

## 2023-04-10 DIAGNOSIS — H35033 Hypertensive retinopathy, bilateral: Secondary | ICD-10-CM | POA: Diagnosis not present

## 2023-04-13 ENCOUNTER — Encounter: Payer: Self-pay | Admitting: Internal Medicine

## 2023-04-13 ENCOUNTER — Ambulatory Visit: Payer: Medicare Other | Attending: Internal Medicine | Admitting: Internal Medicine

## 2023-04-13 VITALS — BP 134/86 | HR 81 | Ht 71.0 in | Wt 216.0 lb

## 2023-04-13 DIAGNOSIS — I4891 Unspecified atrial fibrillation: Secondary | ICD-10-CM | POA: Diagnosis not present

## 2023-04-13 NOTE — Progress Notes (Signed)
HPI [name] returns today for followup of his atrial fib. He is a pleasant 82 yo man with a h/o HTN and atrial fib. He has been tried on multiple medications. His flecainide became ineffective. He was placed on propafenone and has felt better but review of HR and BP suggest that he has not maintained NSR with propafenone. He does not have much in the way of palpitations. He denies chest pain. No edema    Allergies  Allergen Reactions   Albuterol Sulfate [Albuterol] Other (See Comments)    rapid heartbeat; afib   Hyoscyamine Sulfate     Other reaction(s): urinary retention   Adhesive [Tape] Rash and Other (See Comments)    New heart monitor/patch (adhesive) broke out the skin and left a large red patch (still visible after 3 weeks   Warfarin And Related Other (See Comments)    Patient's INR could never get regulated while on this     Current Outpatient Medications  Medication Sig Dispense Refill   acetaminophen (TYLENOL) 500 MG tablet Take 500-1,000 mg by mouth every 6 (six) hours as needed (pain).     amLODipine (NORVASC) 5 MG tablet Take 1 tablet (5 mg total) by mouth daily. 90 tablet 3   Carboxymethylcellulose Sodium (THERATEARS) 0.25 % SOLN Place 1 drop into both eyes daily as needed (dry eyes).     Cholecalciferol (VITAMIN D3) 50 MCG (2000 UT) TABS Take by mouth.     digoxin (LANOXIN) 0.125 MG tablet Take one tablet by mouth daily EXCEPT on Sunday. 90 tablet 3   donepezil (ARICEPT) 10 MG tablet Take 10 mg by mouth at bedtime.     ELIQUIS 5 MG TABS tablet TAKE 1 TABLET BY MOUTH TWICE A DAY (Patient taking differently: Take 5 mg by mouth 2 (two) times daily.) 60 tablet 11   FIBER PO Take 1 capsule by mouth daily.     finasteride (PROSCAR) 5 MG tablet Take 5 mg by mouth at bedtime.   12   furosemide (LASIX) 20 MG tablet Take 20 mg by mouth daily as needed.     INCRUSE ELLIPTA 62.5 MCG/INH AEPB Take 1 puff by mouth daily.  1   memantine (NAMENDA) 5 MG tablet Take 5 mg by  mouth 2 (two) times daily.     metoprolol succinate (TOPROL-XL) 25 MG 24 hr tablet TAKE 1 TABLET(25 MG) BY MOUTH DAILY 90 tablet 3   Multiple Vitamins-Minerals (PRESERVISION AREDS 2+MULTI VIT PO) Take 1 capsule by mouth in the morning and at bedtime.     nitroGLYCERIN (NITROSTAT) 0.4 MG SL tablet Place 1 tablet (0.4 mg total) under the tongue every 5 (five) minutes as needed for chest pain (MAX 3 TABLETS). 25 tablet 11   polyethylene glycol (MIRALAX / GLYCOLAX) 17 g packet Take 17 g by mouth daily as needed for mild constipation. 14 each 0   tamsulosin (FLOMAX) 0.4 MG CAPS capsule Take 1 capsule (0.4 mg total) by mouth daily after breakfast. 14 capsule 0   No current facility-administered medications for this visit.   Facility-Administered Medications Ordered in Other Visits  Medication Dose Route Frequency Provider Last Rate Last Admin   gadobenate dimeglumine (MULTIHANCE) injection 20 mL  20 mL Intravenous Once Radiologist, Medication, MD         Past Medical History:  Diagnosis Date   Arthritis    both knees   Atrial fibrillation (HCC)    Paroxysmal, s/p PVI 11/11/10   Atrial flutter (HCC)  s/p RFCA  by GT   Basal cell carcinoma of ear    CAD (coronary artery disease)    a. cath 5/12: pLAD 25%, D1 25%, EF 65 % (done 2/2 abnormal myoview with inferapical  ischemia)   Dyslipidemia    Dysrhythmia    HTN (hypertension)    Hx of echocardiogram    Echo (9/15):  Mild LVH, EF 55-60%, no RWMA, normal diast function, mild MR, mod LAE, mildly increased pulmonary pressures (PASP 30 mmHg).   Hypertension    Shortness of breath dyspnea    has a low heart rate   Snoring    a. sleep study 11/2012 neg for OSA; increased leg movement suspicious for primary movement d/o (ie restless leg syndrome)    Stroke (HCC)    mild stroke with  second heart ablation   TIA (transient ischemic attack)    11/12/10 post afib ablation    ROS:   All systems reviewed and negative except as noted in the  HPI.   Past Surgical History:  Procedure Laterality Date   atrial flutter ablation     by GT   basal cell cancer resection     CARDIOVERSION  10/31/2011   Procedure: CARDIOVERSION;  Surgeon: Dolores Patty, MD;  Location: [facility] ENDOSCOPY;  Service: Cardiovascular;  Laterality: N/A;   CATARACT EXTRACTION, BILATERAL     COLONOSCOPY     EYE SURGERY     growth under lid- x2   HEMORRHOID SURGERY     KNEE ARTHROSCOPY Bilateral    bilateral   SHOULDER ARTHROSCOPY Right 08/04/2015   Procedure: ARTHROSCOPY RIGHT SHOULDER;  Surgeon: Gean Birchwood, MD;  Location: [facility];  Service: Orthopedics;  Laterality: Right;   TEE WITHOUT CARDIOVERSION  10/31/2011   Procedure: TRANSESOPHAGEAL ECHOCARDIOGRAM (TEE);  Surgeon: Dolores Patty, MD;  Location: [facility] ENDOSCOPY;  Service: Cardiovascular;  Laterality: N/A;     Family History  Problem Relation Age of Onset   Hypertension Mother    Stroke Mother    Heart disease Mother    Arthritis Mother    Heart attack Neg Hx      Social History   Socioeconomic History   Marital status: Divorced    Spouse name: Not on file   Number of children: 1   Years of education: Not on file   Highest education level: Not on file  Occupational History   Occupation: Archivist for [employer]    Employer: RETIRED    Comment: Retired  Tobacco Use   Smoking status: Former    Current packs/day: 0.00    Average packs/day: 1.5 packs/day for 20.0 years (30.0 ttl pk-yrs)    Types: Cigarettes    Start date: 05/23/1955    Quit date: 05/23/1975    Years since quitting: 47.9   Smokeless tobacco: Never  Vaping Use   Vaping status: Never Used  Substance and Sexual Activity   Alcohol use: No    Alcohol/week: 14.0 standard drinks of alcohol    Types: 14 Standard drinks or equivalent per week    Comment: has not had a drink in 9 months   Drug use: No   Sexual activity: Not on file  Other Topics Concern   Not on file  Social History Narrative   Not on file    Social Determinants of Health   Financial Resource Strain: Not on file  Food Insecurity: Not on file  Transportation Needs: Not on file  Physical Activity: Not on file  Stress: Not  on file  Social Connections: Not on file  Intimate Partner Violence: Not on file     BP 134/86   Pulse 81   Ht 5\' 11"  (1.803 m)   Wt 216 lb (98 kg)   SpO2 99%   BMI 30.13 kg/m   Physical Exam:  Well appearing NAD HEENT: Unremarkable Neck:  No JVD, no thyromegally Lymphatics:  No adenopathy Back:  No CVA tenderness Lungs:  Clear HEART:  Regular rate rhythm, no murmurs, no rubs, no clicks Abd:  soft, positive bowel sounds, no organomegally, no rebound, no guarding Ext:  2 plus pulses, no edema, no cyanosis, no clubbing Skin:  No rashes no nodules Neuro:  CN II through XII intact, motor grossly intact  EKG  DEVICE  Normal device function.  See PaceArt for details.   Assess/Plan:  Persistent atrial fib - I discussed the treatment options and he would like to continue with rate control. We will continue toprol. His VR appears controlled.  HTN - his bp is well controlled today. We will follow. Coags - he will continue eliquis with no bleeding. Dementia -this seems to have stabilized. Continue namenda.   Sharlot Gowda Rankin Coolman,MD

## 2023-04-13 NOTE — Patient Instructions (Signed)
Medication Instructions:  Your physician recommends that you continue on your current medications as directed. Please refer to the Current Medication list given to you today.  *If you need a refill on your cardiac medications before your next appointment, please call your pharmacy*  Lab Work: None ordered.  If you have labs (blood work) drawn today and your tests are completely normal, you will receive your results only by: MyChart Message (if you have MyChart) OR A paper copy in the mail If you have any lab test that is abnormal or we need to change your treatment, we will call you to review the results.  Testing/Procedures: None ordered.  Follow-Up: At [facility], you and your health needs are our priority.  As part of our continuing mission to provide you with exceptional heart care, we have created designated Provider Care Teams.  These Care Teams include your primary Cardiologist (physician) and Advanced Practice Providers (APPs -  Physician Assistants and Nurse Practitioners) who all work together to provide you with the care you need, when you need it.   Your next appointment:   1 year(s)  The format for your next appointment:   In Person  Provider:   Lewayne Bunting, MD{or one of the following Advanced Practice Providers on your designated Care Team:   Francis Dowse, [place] Casimiro Needle "Mardelle Matte" [place], [place] Earnest Rosier, NP    Important Information About Sugar

## 2023-04-17 DIAGNOSIS — D485 Neoplasm of uncertain behavior of skin: Secondary | ICD-10-CM | POA: Diagnosis not present

## 2023-04-17 DIAGNOSIS — L988 Other specified disorders of the skin and subcutaneous tissue: Secondary | ICD-10-CM | POA: Diagnosis not present

## 2023-05-03 ENCOUNTER — Ambulatory Visit
Admission: RE | Admit: 2023-05-03 | Discharge: 2023-05-03 | Disposition: A | Discharge status: Home / Self Care | Payer: Medicare Other | Source: Ambulatory Visit | Attending: Family Medicine | Admitting: Family Medicine

## 2023-05-03 ENCOUNTER — Other Ambulatory Visit: Payer: Self-pay | Admitting: Family Medicine

## 2023-05-03 DIAGNOSIS — M549 Dorsalgia, unspecified: Secondary | ICD-10-CM

## 2023-05-03 DIAGNOSIS — R0781 Pleurodynia: Secondary | ICD-10-CM

## 2023-05-03 DIAGNOSIS — M545 Low back pain, unspecified: Secondary | ICD-10-CM | POA: Diagnosis not present

## 2023-05-03 DIAGNOSIS — M546 Pain in thoracic spine: Secondary | ICD-10-CM | POA: Diagnosis not present

## 2023-05-03 DIAGNOSIS — Z9181 History of falling: Secondary | ICD-10-CM | POA: Diagnosis not present

## 2023-05-09 DIAGNOSIS — H35033 Hypertensive retinopathy, bilateral: Secondary | ICD-10-CM | POA: Diagnosis not present

## 2023-05-09 DIAGNOSIS — H35363 Drusen (degenerative) of macula, bilateral: Secondary | ICD-10-CM | POA: Diagnosis not present

## 2023-05-18 ENCOUNTER — Other Ambulatory Visit: Payer: Self-pay | Admitting: Internal Medicine

## 2023-07-10 ENCOUNTER — Other Ambulatory Visit: Payer: Self-pay | Admitting: Internal Medicine

## 2023-08-20 ENCOUNTER — Other Ambulatory Visit: Payer: Self-pay | Admitting: Family Medicine

## 2023-08-20 ENCOUNTER — Encounter: Payer: Self-pay | Admitting: Physician Assistant

## 2023-08-20 ENCOUNTER — Other Ambulatory Visit: Payer: Self-pay

## 2023-08-20 DIAGNOSIS — R413 Other amnesia: Secondary | ICD-10-CM

## 2023-09-07 ENCOUNTER — Ambulatory Visit
Admission: RE | Admit: 2023-09-07 | Discharge: 2023-09-07 | Disposition: A | Discharge status: Home / Self Care | Source: Ambulatory Visit | Attending: Family Medicine | Admitting: Family Medicine

## 2023-09-07 DIAGNOSIS — R413 Other amnesia: Secondary | ICD-10-CM

## 2023-09-07 MED ORDER — GADOPICLENOL 0.5 MMOL/ML IV SOLN
10.0000 mL | Freq: Once | INTRAVENOUS | Status: AC | PRN
  Administered 2023-09-07: 10 mL via INTRAVENOUS

## 2023-09-22 ENCOUNTER — Emergency Department (HOSPITAL_COMMUNITY)

## 2023-09-22 ENCOUNTER — Encounter (HOSPITAL_COMMUNITY): Payer: Self-pay | Admitting: Emergency Medicine

## 2023-09-22 ENCOUNTER — Other Ambulatory Visit: Payer: Self-pay

## 2023-09-22 ENCOUNTER — Emergency Department (HOSPITAL_COMMUNITY)
Admission: EM | Admit: 2023-09-22 | Discharge: 2023-09-22 | Disposition: A | Discharge status: Home / Self Care | Attending: Emergency Medicine | Admitting: Emergency Medicine

## 2023-09-22 DIAGNOSIS — Z7901 Long term (current) use of anticoagulants: Secondary | ICD-10-CM | POA: Insufficient documentation

## 2023-09-22 DIAGNOSIS — I2510 Atherosclerotic heart disease of native coronary artery without angina pectoris: Secondary | ICD-10-CM | POA: Insufficient documentation

## 2023-09-22 DIAGNOSIS — I700 Atherosclerosis of aorta: Secondary | ICD-10-CM | POA: Insufficient documentation

## 2023-09-22 DIAGNOSIS — N390 Urinary tract infection, site not specified: Secondary | ICD-10-CM | POA: Diagnosis not present

## 2023-09-22 DIAGNOSIS — Z79899 Other long term (current) drug therapy: Secondary | ICD-10-CM | POA: Diagnosis not present

## 2023-09-22 DIAGNOSIS — K760 Fatty (change of) liver, not elsewhere classified: Secondary | ICD-10-CM | POA: Diagnosis not present

## 2023-09-22 DIAGNOSIS — I10 Essential (primary) hypertension: Secondary | ICD-10-CM | POA: Insufficient documentation

## 2023-09-22 DIAGNOSIS — Z8673 Personal history of transient ischemic attack (TIA), and cerebral infarction without residual deficits: Secondary | ICD-10-CM | POA: Diagnosis not present

## 2023-09-22 DIAGNOSIS — R600 Localized edema: Secondary | ICD-10-CM | POA: Insufficient documentation

## 2023-09-22 DIAGNOSIS — K449 Diaphragmatic hernia without obstruction or gangrene: Secondary | ICD-10-CM | POA: Insufficient documentation

## 2023-09-22 DIAGNOSIS — R531 Weakness: Secondary | ICD-10-CM | POA: Diagnosis present

## 2023-09-22 DIAGNOSIS — N400 Enlarged prostate without lower urinary tract symptoms: Secondary | ICD-10-CM | POA: Diagnosis not present

## 2023-09-22 DIAGNOSIS — K5900 Constipation, unspecified: Secondary | ICD-10-CM | POA: Diagnosis not present

## 2023-09-22 LAB — URINALYSIS, ROUTINE W REFLEX MICROSCOPIC
Bilirubin Urine: NEGATIVE
Glucose, UA: NEGATIVE mg/dL
Hgb urine dipstick: NEGATIVE
Ketones, ur: NEGATIVE mg/dL
Nitrite: POSITIVE — AB
Protein, ur: NEGATIVE mg/dL
Specific Gravity, Urine: 1.015 (ref 1.005–1.030)
WBC, UA: >50 WBC/hpf (ref 0–5)
pH: 7 (ref 5.0–8.0)

## 2023-09-22 LAB — COMPREHENSIVE METABOLIC PANEL WITH GFR
ALT: 16 U/L (ref 0–44)
AST: 18 U/L (ref 15–41)
Albumin: 3.4 g/dL — ABNORMAL LOW (ref 3.5–5.0)
Alkaline Phosphatase: 51 U/L (ref 38–126)
Anion gap: 10 (ref 5–15)
BUN: 16 mg/dL (ref 8–23)
CO2: 23 mmol/L (ref 22–32)
Calcium: 9 mg/dL (ref 8.9–10.3)
Chloride: 103 mmol/L (ref 98–111)
Creatinine, Ser: 1.05 mg/dL (ref 0.61–1.24)
GFR, Estimated: >60 mL/min (ref >60)
Glucose, Bld: 121 mg/dL — ABNORMAL HIGH (ref 70–99)
Potassium: 4.2 mmol/L (ref 3.5–5.1)
Sodium: 136 mmol/L (ref 135–145)
Total Bilirubin: 2.5 mg/dL — ABNORMAL HIGH (ref 0.0–1.2)
Total Protein: 6.7 g/dL (ref 6.5–8.1)

## 2023-09-22 LAB — CBC
HCT: 46.7 % (ref 39.0–52.0)
Hemoglobin: 15.4 g/dL (ref 13.0–17.0)
MCH: 31.6 pg (ref 26.0–34.0)
MCHC: 33 g/dL (ref 30.0–36.0)
MCV: 95.9 fL (ref 80.0–100.0)
Platelets: 207 10*3/uL (ref 150–400)
RBC: 4.87 MIL/uL (ref 4.22–5.81)
RDW: 12.9 % (ref 11.5–15.5)
WBC: 17.6 10*3/uL — ABNORMAL HIGH (ref 4.0–10.5)
nRBC: 0 % (ref 0.0–0.2)

## 2023-09-22 LAB — DIGOXIN LEVEL: Digoxin Level: 0.4 ng/mL — ABNORMAL LOW (ref 0.8–2.0)

## 2023-09-22 LAB — ETHANOL: Alcohol, Ethyl (B): <15 mg/dL (ref <15)

## 2023-09-22 LAB — CBG MONITORING, ED: Glucose-Capillary: 114 mg/dL — ABNORMAL HIGH (ref 70–99)

## 2023-09-22 MED ORDER — IOHEXOL 350 MG/ML SOLN
75.0000 mL | Freq: Once | INTRAVENOUS | Status: AC | PRN
  Administered 2023-09-22: 75 mL via INTRAVENOUS

## 2023-09-22 MED ORDER — CEPHALEXIN 500 MG PO CAPS: 500.0000 mg | ORAL_CAPSULE | Freq: Four times a day (QID) | ORAL | 0 refills | Status: DC

## 2023-09-22 MED ORDER — SODIUM CHLORIDE 0.9 % IV SOLN
1.0000 g | Freq: Once | INTRAVENOUS | Status: AC
  Administered 2023-09-22: 1 g via INTRAVENOUS
  Filled 2023-09-22: qty 10

## 2023-09-22 NOTE — ED Notes (Signed)
 Patient transported to CT

## 2023-09-22 NOTE — ED Triage Notes (Signed)
 Pt BIB EMS from home with c/o increased weakness after working outside today. Per ems, a&o x1 is baseline, weakness is not.  160/100 97RA 91HR 125cbg

## 2023-09-22 NOTE — ED Provider Notes (Signed)
 Patient's care assumed at 6:30 AM.  Patient is pending CT scan of his head and abdomen.  Patient has had increased weakness.  UA shows urinary tract infection. CT returned CT head shows no acute changes no evidence of CVA CT abdomen pelvis no acute findings. Patient's wife is present I discussed findings with patient and her.  Patient is given a prescription for Keflex.  He has received Rocephin here.  Patient advised to follow-up with primary care for recheck this week return to the emergency department if any problems   Kacey Vicuna K, PA-C 09/22/23 1012    Auston Blush, MD 09/24/23 [phone]

## 2023-09-22 NOTE — Discharge Instructions (Addendum)
Return if any problems.  See your Physician for recheck this week  

## 2023-09-22 NOTE — ED Provider Notes (Signed)
 [facility] Provider Note   CSN: [id number] Arrival date & time: 09/22/23  0422     History {Add pertinent medical, surgical, social history, OB history to HPI:1} Chief Complaint  Patient presents with   Weakness    Erik [name] is a 83 y.o. male.  The history is provided by the spouse, the patient, the EMS personnel and medical records. No language interpreter was used.  Weakness    83 year old male history of prior stroke, atrial flutter currently on Eliquis , CAD, hypertension brought here via EMS from home for evaluation of weakness.  History obtained through wife who is at bedside.  Per wife, 7 weeks ago patient became more confused than usual.  States he has been seen evaluated by his doctor several times for his increased confusion and has had brain MRI without any acute finding.  Today after he was outside in the yard he came in appears increasingly more confused and generally weak.  She thought he was dehydrated and gave him a fluid to drink.  Throughout the night patient has gone to the bathroom to urinate multiple times, more frequent than usual.  No report of any fever or nausea or vomiting.  Patient denies having any pain.  Home Medications Prior to Admission medications   Medication Sig Start Date End Date Taking? Authorizing Provider  acetaminophen  (TYLENOL ) 500 MG tablet Take 500-1,000 mg by mouth every 6 (six) hours as needed (pain).    [provider]  amLODipine  (NORVASC ) 5 MG tablet Take 1 tablet (5 mg total) by mouth daily. 06/16/20   Tammie Fall, MD  Carboxymethylcellulose Sodium (THERATEARS) 0.25 % SOLN Place 1 drop into both eyes daily as needed (dry eyes).    [provider]  Cholecalciferol (VITAMIN D3) 50 MCG (2000 UT) TABS Take by mouth.    [provider]  digoxin  (LANOXIN ) 0.125 MG tablet TAKE 1 TABLET BY MOUTH DAILY. EXCEPT ON SUNDAY 07/12/23   Tammie Fall, MD  donepezil   (ARICEPT ) 10 MG tablet Take 10 mg by mouth at bedtime. 07/04/18   [provider]  ELIQUIS  5 MG TABS tablet TAKE 1 TABLET BY MOUTH TWICE A DAY Patient taking differently: Take 5 mg by mouth 2 (two) times daily. 03/21/18   Tammie Fall, MD  FIBER PO Take 1 capsule by mouth daily.    [provider]  finasteride  (PROSCAR ) 5 MG tablet Take 5 mg by mouth at bedtime.  08/08/17   [provider]  furosemide (LASIX) 20 MG tablet Take 20 mg by mouth daily as needed. 04/05/22   [provider]  INCRUSE ELLIPTA  62.5 MCG/INH AEPB Take 1 puff by mouth daily. 01/29/17   [provider]  memantine  (NAMENDA ) 5 MG tablet Take 5 mg by mouth 2 (two) times daily. 07/21/20   [provider]  metoprolol  succinate (TOPROL -XL) 25 MG 24 hr tablet TAKE 1 TABLET(25 MG) BY MOUTH DAILY 05/21/23   Tammie Fall, MD  Multiple Vitamins-Minerals (PRESERVISION AREDS 2+MULTI VIT PO) Take 1 capsule by mouth in the morning and at bedtime.    [provider]  nitroGLYCERIN  (NITROSTAT ) 0.4 MG SL tablet Place 1 tablet (0.4 mg total) under the tongue every 5 (five) minutes as needed for chest pain (MAX 3 TABLETS). 04/28/21   Tammie Fall, MD  polyethylene glycol (MIRALAX  / GLYCOLAX ) 17 g packet Take 17 g by mouth daily as needed for mild constipation. 06/15/20   Edsel Grace,  MD  tamsulosin  (FLOMAX ) 0.4 MG CAPS capsule Take 1 capsule (0.4 mg total) by mouth daily after breakfast. 06/15/20   Edsel Grace, MD      Allergies    Albuterol  sulfate [albuterol ], Hyoscyamine sulfate, Adhesive [tape], and Warfarin and related    Review of Systems   Review of Systems  Neurological:  Positive for weakness.  All other systems reviewed and are negative.   Physical Exam Updated Vital Signs BP (!) 159/79   Pulse 86   Temp 99.1 F (37.3 C)   Resp 16   Ht 5\' 11"  (1.803 m)   Wt 98 kg   SpO2 97%   BMI 30.13 kg/m  Physical Exam Constitutional:      General: He is not in  acute distress.    Appearance: He is well-developed.     Comments: Patient laying in bed with his eyes closed but arousable and appears to be in no acute discomfort.  HENT:     Head: Atraumatic.  Eyes:     Extraocular Movements: Extraocular movements intact.     Conjunctiva/sclera: Conjunctivae normal.     Pupils: Pupils are equal, round, and reactive to light.  Cardiovascular:     Rate and Rhythm: Normal rate and regular rhythm.     Pulses: Normal pulses.     Heart sounds: Normal heart sounds.  Pulmonary:     Effort: Pulmonary effort is normal.     Breath sounds: Normal breath sounds.  Abdominal:     Tenderness: There is abdominal tenderness (Diffusely tender no focal point tenderness).  Musculoskeletal:        General: Swelling present.     Cervical back: Normal range of motion and neck supple.     Right lower leg: Edema present.     Left lower leg: Edema present.  Skin:    Findings: No rash.  Neurological:     Mental Status: He is alert. He is disoriented.     ED Results / Procedures / Treatments   Labs (all labs ordered are listed, but only abnormal results are displayed) Labs Reviewed  COMPREHENSIVE METABOLIC PANEL WITH GFR - Abnormal; Notable for the following components:      Result Value   Glucose, Bld 121 (*)    Albumin 3.4 (*)    Total Bilirubin 2.5 (*)    All other components within normal limits  CBC - Abnormal; Notable for the following components:   WBC 17.6 (*)    All other components within normal limits  URINALYSIS, ROUTINE W REFLEX MICROSCOPIC - Abnormal; Notable for the following components:   APPearance HAZY (*)    Nitrite POSITIVE (*)    Leukocytes,Ua MODERATE (*)    Bacteria, UA RARE (*)    All other components within normal limits  DIGOXIN  LEVEL - Abnormal; Notable for the following components:   Digoxin  Level 0.4 (*)    All other components within normal limits  CBG MONITORING, ED - Abnormal; Notable for the following components:    Glucose-Capillary 114 (*)    All other components within normal limits  URINE CULTURE  ETHANOL    EKG None  Radiology No results found.  Procedures Procedures  {Document cardiac monitor, telemetry assessment procedure when appropriate:1}  Medications Ordered in ED Medications  cefTRIAXone (ROCEPHIN) 1 g in sodium chloride  0.9 % 100 mL IVPB (has no administration in time range)    ED Course/ Medical Decision Making/ A&P   {   Click here for ABCD2, HEART and  other calculatorsREFRESH Note before signing :1}                              Medical Decision Making Amount and/or Complexity of Data Reviewed Labs: ordered. Radiology: ordered.   BP (!) 166/78   Pulse 75   Temp 99.1 F (37.3 C)   Resp 18   Ht 5\' 11"  (1.803 m)   Wt 98 kg   SpO2 94%   BMI 30.13 kg/m   45:26 AM   82 year old male history of prior stroke, atrial flutter currently on Eliquis , CAD, hypertension brought here via EMS from home for evaluation of weakness.  History obtained through wife who is at bedside.  Per wife, 7 weeks ago patient became more confused than usual.  States he has been seen evaluated by his doctor several times for his increased confusion and has had brain MRI without any acute finding.  Today after he was outside in the yard he came in appears increasingly more confused and generally weak.  She thought he was dehydrated and gave him a fluid to drink.  Throughout the night patient has gone to the bathroom to urinate multiple times, more frequent than usual.  No report of any fever or nausea or vomiting.  Patient denies having any pain.  On exam, patient is laying in bed in no acute discomfort.  Patient without focal neurodeficit however, he is   -Labs ordered, independently viewed and interpreted by me.  Labs remarkable for UA with nitrite positive, moderate leukocytes, greater than 50 WBC consistent with UTI.  I have sent urine culture, and I will also initiate Rocephin antibiotic.   Elevated white count of 17.6.  Digoxin  is subtherapeutic -The patient was maintained on a cardiac monitor.  I personally viewed and interpreted the cardiac monitored which showed an underlying rhythm of: Normal sinus rhythm -Imaging including head CT scan as well as abdominal pelvis CT scan ordered and have not resulted yet -This patient presents to the ED for concern of altered mental status, this involves an extensive number of treatment options, and is a complaint that carries with it a high risk of complications and morbidity.  The differential diagnosis includes urinary tract infection, stroke, electrolyte imbalance, hyper/hypoglycemia, sepsis -Co morbidities that complicate the patient evaluation includes prior stroke, atrial flutter, CAD, hypertension -Treatment includes Rocephin -Reevaluation of the patient after these medicines showed that the patient stayed the same -PCP office notes or outside notes reviewed -Discussion with oncoming team who will f/u on CT scan and determine disposition.  If CT are normal and patient tolerates PO, then pt can be discharge home with antibiotic for UTI -Escalation to admission/observation considered: dispo pending   {Document critical care time when appropriate:1} {Document review of labs and clinical decision tools ie heart score, Chads2Vasc2 etc:1}  {Document your independent review of radiology images, and any outside records:1} {Document your discussion with family members, caretakers, and with consultants:1} {Document social determinants of health affecting pt's care:1} {Document your decision making why or why not admission, treatments were needed:1} Final Clinical Impression(s) / ED Diagnoses Final diagnoses:  None    Rx / DC Orders ED Discharge Orders     None

## 2023-09-24 LAB — URINE CULTURE: Culture: >=100000 — AB

## 2023-09-25 ENCOUNTER — Telehealth (HOSPITAL_BASED_OUTPATIENT_CLINIC_OR_DEPARTMENT_OTHER): Payer: Self-pay

## 2023-09-25 NOTE — Telephone Encounter (Signed)
 Post ED Visit - Positive Culture Follow-up  Culture report reviewed by antimicrobial stewardship pharmacist: [facility] [x]  [place], [place].D. []  Skeet Duke, Pharm.D., BCPS AQ-ID []  Leslee Rase, Pharm.D., BCPS []  Garland Junk, Pharm.D., BCPS []  [place], [address].D., BCPS, AAHIVP []  Alcide Aly, Pharm.D., BCPS, AAHIVP []  Jerri Morale, PharmD, BCPS []  Graham Laws, PharmD, BCPS []  Cleda Curly, PharmD, BCPS []  Tamar Fairly, PharmD []  Ballard Levels, PharmD, BCPS []  Ollen Beverage, PharmD  [facility] []  Arlyne Bering, PharmD []  Sherryle Don, PharmD []  Van Gelinas, PharmD []  Delila Felty, Rph []  [place]) Cleora Daft, PharmD []  Augustina Block, PharmD []  Arie Kurtz, PharmD []  Sharlyn Deaner, PharmD []  Agnes Hose, PharmD []  Kendall Pauls, PharmD []  Gladstone Lamer, PharmD []  Armanda Bern, PharmD []  Tera Fellows, PharmD   Positive urine culture Treated with Cephalexin, organism sensitive to the same and no further patient follow-up is required at this time.  Delena Feil 09/25/2023, 1:51 PM

## 2023-09-28 ENCOUNTER — Ambulatory Visit

## 2023-09-28 ENCOUNTER — Encounter: Payer: Self-pay | Admitting: Physician Assistant

## 2023-09-28 ENCOUNTER — Other Ambulatory Visit

## 2023-09-28 ENCOUNTER — Ambulatory Visit: Admitting: Physician Assistant

## 2023-09-28 VITALS — BP 136/80 | HR 78 | Resp 18 | Ht 71.0 in

## 2023-09-28 DIAGNOSIS — R413 Other amnesia: Secondary | ICD-10-CM | POA: Diagnosis not present

## 2023-09-28 MED ORDER — MEMANTINE HCL 10 MG PO TABS: ORAL_TABLET | ORAL | 3 refills | Status: AC

## 2023-09-28 NOTE — Progress Notes (Addendum)
 Assessment/Plan:     Erik [name] is a very pleasant 83 y.o. year old RH male with a history of hypertension, hyperlipidemia, CAD, history of CVA in 2016 (postablation), arthritis atrial fibrillation on Eliquis  seen today for evaluation of memory loss.He carries a diagnosis of dementia since around his CVA, listed as Alzheimer's although no other neurological studies are seen. Patient is already on donepezil  10 mg daily and memantine  5 mg twice daily by his PCP  MMSE is 14/30 (unable to perform MoCA). Interestingly, he shows some parkinsonian signs, including flat affect, resting and intention tremor, increased tone and B cogwheeling, sort stride, wide based gait, microphonia, micrographia, suspicious or ex, for Parkinson's  Disease. MRI brain from 08/2023, without acute abnormalities, some atrophy, not severe enough to explain his decreased cognitive performance today. Discussed increasing memantine  to 10 mg bid , wife agrees to proceed. We will continue monitoring his movements during the next visit and may entertain PD workup.     Memory Impairment of unclear etiology     Check B12, TSH Continue donepezil  10 mg daily. Side effects were discussed  Increase memantine  to 10 mg bid, side effects discussed Recommend good control of cardiovascular risk factors.   Continue to control mood as per PCP Fol[place] up in 3 months   Subjective:    The patient is accompanied by his wife  who supplement  the history.    How long did patient have memory difficulties?  For about. 9 years, "worse 10 weeks ago when he could not remember where he was and were things and rooms were, but he had a UTI"-wife says. LTM is fair.  repeats oneself?  Denies  Disoriented when walking into a room? Endorsed, may confuse rooms when going to the bathroom. Leaving objects in unusual places?  Denies.   Wandering behavior? Denies.   Any personality changes, or depression, anxiety? Denies  Hallucinations or paranoia?  Denies.   Seizures? Denies.    Any sleep changes?  Sleeps well denies frequent nightmares or dream reenactment  Sleep apnea? Denies.   Any hygiene concerns?  Denies.   Independent of bathing and dressing? needs assistance Does the patient need help with medications?  Wife is in charge   Who is in charge of the finances?Wife  is in charge     Any changes in appetite?   Denies.     Patient have trouble swallowing?  Denies.   Does the patient cook?  No, denies forgetting common recipes or kitchen accidents   Any headaches?  Denies.   Chronic pain? chronic knee pain "bone on bone",  and peripheral neuropathy Ambulates with difficulty? He has significant difficulty ambulating, partially due to his knee pain, needs a wheelchair to go out, and a walker at home. knee replacement ."HE shuffles, gait is short"  Does not want PT Recent falls or head injuries? Denies.     Vision changes?  Denies any new issues.    Any strokelike symptoms? Denies.   Any tremors? Endorsed, initially though to be due to meds but he continues to have resting and on intention tremors  Any anosmia? Denies.   Any incontinence of urine? Endorsed, had recent UTI. "He is not quite sure when he is going" Any bowel dysfunction? Denies.      Patient lives with  wife   History of heavy alcohol intake? Denies.   History of heavy tobacco use?  Quit 3 years ago Family history of dementia?  Father had AD  Does patient drive? No longer drives for the last year Retired Archivist for the [employer]  Allergies  Allergen Reactions   Albuterol  Sulfate [Albuterol ] Other (See Comments)    rapid heartbeat; afib   Hyoscyamine Sulfate     Other reaction(s): urinary retention   Adhesive [Tape] Rash and Other (See Comments)    New heart monitor/patch (adhesive) broke out the skin and left a large red patch (still visible after 3 weeks   Warfarin And Related Other (See Comments)    Patient's INR could never get regulated  while on this    Current Outpatient Medications  Medication Instructions   acetaminophen  (TYLENOL ) 500-1,000 mg, Every 6 hours PRN   amLODipine  (NORVASC ) 5 mg, Oral, Daily   Carboxymethylcellulose Sodium (THERATEARS) 0.25 % SOLN 1 drop, Daily PRN   cephALEXin  (KEFLEX ) 500 mg, Oral, 4 times daily   Cholecalciferol (VITAMIN D3) 50 MCG (2000 UT) TABS Take by mouth.   digoxin  (LANOXIN ) 0.125 MG tablet TAKE 1 TABLET BY MOUTH DAILY. EXCEPT ON SUNDAY   donepezil  (ARICEPT ) 10 mg, Daily at bedtime   ELIQUIS  5 MG TABS tablet TAKE 1 TABLET BY MOUTH TWICE A DAY   FIBER PO 1 capsule, Daily   finasteride  (PROSCAR ) 5 mg, Daily at bedtime   furosemide (LASIX) 20 mg, Daily PRN   INCRUSE ELLIPTA  62.5 MCG/INH AEPB 1 puff, Daily   memantine  (NAMENDA ) 10 MG tablet Take one tablet twice a day   metoprolol  succinate (TOPROL -XL) 25 MG 24 hr tablet TAKE 1 TABLET(25 MG) BY MOUTH DAILY   Multiple Vitamins-Minerals (PRESERVISION AREDS 2+MULTI VIT PO) 1 capsule, 2 times daily   nitroGLYCERIN  (NITROSTAT ) 0.4 mg, Sublingual, Every 5 min PRN   polyethylene glycol (MIRALAX  / GLYCOLAX ) 17 g, Oral, Daily PRN   tamsulosin  (FLOMAX ) 0.4 mg, Oral, Daily after breakfast     VITALS:   Vitals:   09/28/23 0919  BP: 136/80  Pulse: 78  Resp: 18  Height: 5\' 11"  (1.803 m)      PHYSICAL EXAM   HEENT:  Normocephalic, atraumatic.  The superficial temporal arteries are without ropiness or tenderness. Cardiovascular: Regular rate and rhythm. Lungs: Clear to auscultation bilaterally. Neck: There are no carotid bruits noted bilaterally.  NEUROLOGICAL:     No data to display             09/28/2023   12:00 PM  MMSE - Mini Mental State Exam  Orientation to time 0  Orientation to Place 2  Registration 3  Attention/ Calculation 0  Recall 3  Language- name 2 objects 2  Language- repeat 1  Language- follow 3 step command 2  Language- read & follow direction 1  Write a sentence 0  Copy design 0  Total score 14      Orientation:  Alert and oriented to person, not to place and not to time . No aphasia or dysarthria. Fund of knowledge is reduced. Recent and remote memory impaired.  Attention and concentration are reduced .  Able to name objects and unable to repeat phrases.   Delayed recall 3/3  Cranial nerves: There is good facial symmetry. Extraocular muscles are intact and visual fields are full to confrontational testing. Speech is not  fluent but clear. No tongue deviation. Hearing is intact to conversational tone.  Tone: Tone is increased throughout. +L>R cogwheeling BUE Sensation: Sensation is intact to light touch.  Vibration is intact at the bilateral big toe.  Coordination: The patient has no difficulty with RAM's or FNF  bilaterally. Abnormal  finger to nose  (difficulty understanding the command) Motor: Strength is 5/5 in the bilateral upper and lower extremities. Unable to perform pronator drift. There are no  fasciculations noted .possible asterixis L hand DTR's: Deep tendon reflexes are 1/4 bilaterally. Gait and Station: The patient is unable to ambulate, only did a few steps.  Gait is cautious and wide based Stride length is short, bends forward    Thank you for allowing us  the opportunity to participate in the care of this nice patient. Please do not hesitate to contact us  for any questions or concerns.   Total time spent on today's visit was 60 minutes dedicated to this patient today, preparing to see patient, examining the patient, ordering tests and/or medications and counseling the patient, documenting clinical information in the EHR or other health record, independently interpreting results and communicating results to the patient/family, discussing treatment and goals, answering patient's questions and coordinating care.  Cc:  Benedetto Brady, MD  Tex Filbert 09/28/2023 12:32 PM

## 2023-09-28 NOTE — Patient Instructions (Addendum)
 It was a pleasure to see you today at our office.   Recommendations:  Increase memantine  to 10 mg twice a day  Check labs today [address] Follow up in 3  months Recommend visiting the website : " Dementia Success [facility]" to better understand some behaviors related to memory loss.    For psychiatric meds, mood meds: Please have your primary care physician manage these medications.  If you have any severe symptoms of a stroke, or other severe issues such as confusion,severe chills or fever, etc call 911 or go to the ER as you may need to be evaluated further    For assessment of decision of mental capacity and competency:  Call Dr. Laverne Potter, geriatric psychiatrist at [phone]  Counseling regarding caregiver distress, including caregiver depression, anxiety and issues regarding community resources, adult day care programs, adult living facilities, or memory care questions:  please contact your  Primary Doctor's Social Worker   Whom to call: Memory  decline, memory medications: Call our office [phone]    [email or web address]   RECOMMENDATIONS FOR ALL PATIENTS WITH MEMORY PROBLEMS: 1. Continue to exercise (Recommend 30 minutes of walking everyday, or 3 hours every week) 2. Increase social interactions - continue going to [place] and enjoy social gatherings with friends and family 3. Eat healthy, avoid fried foods and eat more fruits and vegetables 4. Maintain adequate blood pressure, blood sugar, and blood cholesterol level. Reducing the risk of stroke and cardiovascular disease also helps promoting better memory. 5. Avoid stressful situations. Live a simple life and avoid aggravations. Organize your time and prepare for the next day in anticipation. 6. Sleep well, avoid any interruptions of sleep and avoid any distractions in the bedroom that may interfere with adequate sleep quality 7. Avoid sugar, avoid sweets as there is  a strong link between excessive sugar intake, diabetes, and cognitive impairment We discussed the Mediterranean diet, which has been shown to help patients reduce the risk of progressive memory disorders and reduces cardiovascular risk. This includes eating fish, eat fruits and green leafy vegetables, nuts like almonds and hazelnuts, walnuts, and also use olive oil. Avoid fast foods and fried foods as much as possible. Avoid sweets and sugar as sugar use has been linked to worsening of memory function.  There is always a concern of gradual progression of memory problems. If this is the case, then we may need to adjust level of care according to patient needs. Support, both to the patient and caregiver, should then be put into place.      You have been referred for a neuropsychological evaluation (i.e., evaluation of memory and thinking abilities). Please bring someone with you to this appointment if possible, as it is helpful for the doctor to hear from both you and another adult who knows you well. Please bring eyeglasses and hearing aids if you wear them.    The evaluation will take approximately 3 hours and has two parts:   The first part is a clinical interview with the neuropsychologist (Dr. Kitty Perkins or Dr. Donavon Fudge). During the interview, the neuropsychologist will speak with you and the individual you brought to the appointment.    The second part of the evaluation is testing with the doctor's technician Bernabe Brew or Burdette Carolin). During the testing, the technician will ask you to remember different types of material, solve problems, and answer some questionnaires. Your family member will not be present for this portion of the evaluation.   Please note: We must reserve several hours  of the neuropsychologist's time and the psychometrician's time for your evaluation appointment. As such, there is a No-Show fee of $100. If you are unable to attend any of your appointments, please contact our office as soon as  possible to reschedule.      DRIVING: Regarding driving, in patients with progressive memory problems, driving will be impaired. We advise to have someone else do the driving if trouble finding directions or if minor accidents are reported. Independent driving assessment is available to determine safety of driving.   If you are interested in the driving assessment, you can contact the following:  [employer] in [place] [phone]  [facility] [phone]  [facility] [phone]  [facility] [phone] or [phone]   FALL PRECAUTIONS: Be cautious when walking. Scan the area for obstacles that may increase the risk of trips and falls. When getting up in the mornings, sit up at the edge of the bed for a few minutes before getting out of bed. Consider elevating the bed at the head end to avoid drop of blood pressure when getting up. Walk always in a well-lit room (use night lights in the walls). Avoid area rugs or power cords from appliances in the middle of the walkways. Use a walker or a cane if necessary and consider physical therapy for balance exercise. Get your eyesight checked regularly.  FINANCIAL OVERSIGHT: Supervision, especially oversight when making financial decisions or transactions is also recommended.  HOME SAFETY: Consider the safety of the kitchen when operating appliances like stoves, microwave oven, and blender. Consider having supervision and share cooking responsibilities until no longer able to participate in those. Accidents with firearms and other hazards in the house should be identified and addressed as well.   ABILITY TO BE LEFT ALONE: If patient is unable to contact 911 operator, consider using LifeLine, or when the need is there, arrange for someone to stay with patients. Smoking is a fire hazard, consider supervision or cessation. Risk of wandering should be assessed by caregiver and if detected at any  point, supervision and safe proof recommendations should be instituted.  MEDICATION SUPERVISION: Inability to self-administer medication needs to be constantly addressed. Implement a mechanism to ensure safe administration of the medications.      Mediterranean Diet A Mediterranean diet refers to food and lifestyle choices that are based on the traditions of countries located on the [employer]. This way of eating has been shown to help prevent certain conditions and improve outcomes for people who have chronic diseases, like kidney disease and heart disease. What are tips for following this plan? Lifestyle  Cook and eat meals together with your family, when possible. Drink enough fluid to keep your urine clear or pale yellow. Be physically active every day. This includes: Aerobic exercise like running or swimming. Leisure activities like gardening, walking, or housework. Get 7-8 hours of sleep each night. If recommended by your health care provider, drink red wine in moderation. This means 1 glass a day for nonpregnant women and 2 glasses a day for men. A glass of wine equals 5 oz (150 mL). Reading food labels  Check the serving size of packaged foods. For foods such as rice and pasta, the serving size refers to the amount of cooked product, not dry. Check the total fat in packaged foods. Avoid foods that have saturated fat or trans fats. Check the ingredients list for added sugars, such as corn syrup. Shopping  At the grocery store, buy most  of your food from the areas near the walls of the store. This includes: Fresh fruits and vegetables (produce). Grains, beans, nuts, and seeds. Some of these may be available in unpackaged forms or large amounts (in bulk). Fresh seafood. Poultry and eggs. Low-fat dairy products. Buy whole ingredients instead of prepackaged foods. Buy fresh fruits and vegetables in-season from local farmers markets. Buy frozen fruits and vegetables in  resealable bags. If you do not have access to quality fresh seafood, buy precooked frozen shrimp or canned fish, such as tuna, salmon, or sardines. Buy small amounts of raw or cooked vegetables, salads, or olives from the deli or salad bar at your store. Stock your pantry so you always have certain foods on hand, such as olive oil, canned tuna, canned tomatoes, rice, pasta, and beans. Cooking  Cook foods with extra-virgin olive oil instead of using butter or other vegetable oils. Have meat as a side dish, and have vegetables or grains as your main dish. This means having meat in small portions or adding small amounts of meat to foods like pasta or stew. Use beans or vegetables instead of meat in common dishes like chili or lasagna. Experiment with different cooking methods. Try roasting or broiling vegetables instead of steaming or sauteing them. Add frozen vegetables to soups, stews, pasta, or rice. Add nuts or seeds for added healthy fat at each meal. You can add these to yogurt, salads, or vegetable dishes. Marinate fish or vegetables using olive oil, lemon juice, garlic, and fresh herbs. Meal planning  Plan to eat 1 vegetarian meal one day each week. Try to work up to 2 vegetarian meals, if possible. Eat seafood 2 or more times a week. Have healthy snacks readily available, such as: Vegetable sticks with hummus. Greek yogurt. Fruit and nut trail mix. Eat balanced meals throughout the week. This includes: Fruit: 2-3 servings a day Vegetables: 4-5 servings a day Low-fat dairy: 2 servings a day Fish, poultry, or lean meat: 1 serving a day Beans and legumes: 2 or more servings a week Nuts and seeds: 1-2 servings a day Whole grains: 6-8 servings a day Extra-virgin olive oil: 3-4 servings a day Limit red meat and sweets to only a few servings a month What are my food choices? Mediterranean diet Recommended Grains: Whole-grain pasta. Brown rice. Bulgar wheat. Polenta. Couscous.  Whole-wheat bread. Dwyane Glad. Vegetables: Artichokes. Beets. Broccoli. Cabbage. Carrots. Eggplant. Green beans. Chard. Kale. Spinach. Onions. Leeks. Peas. Squash. Tomatoes. Peppers. Radishes. Fruits: Apples. Apricots. Avocado. Berries. Bananas. Cherries. Dates. Figs. Grapes. Lemons. Melon. Oranges. Peaches. Plums. Pomegranate. Meats and other protein foods: Beans. Almonds. Sunflower seeds. Pine nuts. Peanuts. Cod. Salmon. Scallops. Shrimp. Tuna. Tilapia. Clams. Oysters. Eggs. Dairy: Low-fat milk. Cheese. Greek yogurt. Beverages: Water. Red wine. Herbal tea. Fats and oils: Extra virgin olive oil. Avocado oil. Grape seed oil. Sweets and desserts: [place] yogurt with honey. Baked apples. Poached pears. Trail mix. Seasoning and other foods: Basil. Cilantro. Coriander. Cumin. Mint. Parsley. Sage. Rosemary. Tarragon. Garlic. Oregano. Thyme. Pepper. Balsalmic vinegar. Tahini. Hummus. Tomato sauce. Olives. Mushrooms. Limit these Grains: Prepackaged pasta or rice dishes. Prepackaged cereal with added sugar. Vegetables: Deep fried potatoes (french fries). Fruits: Fruit canned in syrup. Meats and other protein foods: Beef. Pork. Lamb. Poultry with skin. Hot dogs. Helene Loader. Dairy: Ice cream. Sour cream. Whole milk. Beverages: Juice. Sugar-sweetened soft drinks. Beer. Liquor and spirits. Fats and oils: Butter. Canola oil. Vegetable oil. Beef fat (tallow). Lard. Sweets and desserts: Cookies. Cakes. Pies. Candy. Seasoning and other  foods: Mayonnaise. Premade sauces and marinades. The items listed may not be a complete list. Talk with your dietitian about what dietary choices are right for you. Summary The Mediterranean diet includes both food and lifestyle choices. Eat a variety of fresh fruits and vegetables, beans, nuts, seeds, and whole grains. Limit the amount of red meat and sweets that you eat. Talk with your health care provider about whether it is safe for you to drink red wine in moderation. This  means 1 glass a day for nonpregnant women and 2 glasses a day for men. A glass of wine equals 5 oz (150 mL). This information is not intended to replace advice given to you by your health care provider. Make sure you discuss any questions you have with your health care provider. Document Released: 12/30/2015 Document Revised: 02/01/2016 Document Reviewed: 12/30/2015 Elsevier Interactive Patient Education  2017 [employer].

## 2023-09-29 LAB — TSH: TSH: 1.85 m[IU]/L (ref 0.40–4.50)

## 2023-09-29 LAB — VITAMIN B12: Vitamin B-12: >2000 pg/mL — ABNORMAL HIGH (ref 200–1100)

## 2023-10-27 ENCOUNTER — Emergency Department (HOSPITAL_COMMUNITY)

## 2023-10-27 ENCOUNTER — Inpatient Hospital Stay (HOSPITAL_COMMUNITY)
Admission: EM | Admit: 2023-10-27 | Discharge: 2023-10-31 | DRG: 689 | Disposition: A | Discharge status: Home Health | Attending: Internal Medicine | Admitting: Internal Medicine

## 2023-10-27 ENCOUNTER — Other Ambulatory Visit: Payer: Self-pay

## 2023-10-27 ENCOUNTER — Encounter (HOSPITAL_COMMUNITY): Payer: Self-pay

## 2023-10-27 DIAGNOSIS — I482 Chronic atrial fibrillation, unspecified: Secondary | ICD-10-CM | POA: Diagnosis present

## 2023-10-27 DIAGNOSIS — N400 Enlarged prostate without lower urinary tract symptoms: Secondary | ICD-10-CM | POA: Diagnosis present

## 2023-10-27 DIAGNOSIS — Z87891 Personal history of nicotine dependence: Secondary | ICD-10-CM

## 2023-10-27 DIAGNOSIS — N401 Enlarged prostate with lower urinary tract symptoms: Secondary | ICD-10-CM | POA: Diagnosis present

## 2023-10-27 DIAGNOSIS — I25118 Atherosclerotic heart disease of native coronary artery with other forms of angina pectoris: Secondary | ICD-10-CM

## 2023-10-27 DIAGNOSIS — N390 Urinary tract infection, site not specified: Secondary | ICD-10-CM | POA: Diagnosis not present

## 2023-10-27 DIAGNOSIS — Z8673 Personal history of transient ischemic attack (TIA), and cerebral infarction without residual deficits: Secondary | ICD-10-CM

## 2023-10-27 DIAGNOSIS — G4761 Periodic limb movement disorder: Secondary | ICD-10-CM | POA: Diagnosis present

## 2023-10-27 DIAGNOSIS — R791 Abnormal coagulation profile: Secondary | ICD-10-CM | POA: Diagnosis present

## 2023-10-27 DIAGNOSIS — J449 Chronic obstructive pulmonary disease, unspecified: Secondary | ICD-10-CM | POA: Diagnosis present

## 2023-10-27 DIAGNOSIS — Z7901 Long term (current) use of anticoagulants: Secondary | ICD-10-CM

## 2023-10-27 DIAGNOSIS — R32 Unspecified urinary incontinence: Secondary | ICD-10-CM | POA: Insufficient documentation

## 2023-10-27 DIAGNOSIS — Z823 Family history of stroke: Secondary | ICD-10-CM

## 2023-10-27 DIAGNOSIS — R6510 Systemic inflammatory response syndrome (SIRS) of non-infectious origin without acute organ dysfunction: Secondary | ICD-10-CM | POA: Diagnosis not present

## 2023-10-27 DIAGNOSIS — Z85828 Personal history of other malignant neoplasm of skin: Secondary | ICD-10-CM

## 2023-10-27 DIAGNOSIS — G9341 Metabolic encephalopathy: Secondary | ICD-10-CM | POA: Diagnosis present

## 2023-10-27 DIAGNOSIS — B9620 Unspecified Escherichia coli [E. coli] as the cause of diseases classified elsewhere: Secondary | ICD-10-CM | POA: Diagnosis present

## 2023-10-27 DIAGNOSIS — I10 Essential (primary) hypertension: Secondary | ICD-10-CM | POA: Diagnosis present

## 2023-10-27 DIAGNOSIS — E785 Hyperlipidemia, unspecified: Secondary | ICD-10-CM | POA: Diagnosis present

## 2023-10-27 DIAGNOSIS — R509 Fever, unspecified: Secondary | ICD-10-CM | POA: Diagnosis present

## 2023-10-27 DIAGNOSIS — G9340 Encephalopathy, unspecified: Secondary | ICD-10-CM | POA: Insufficient documentation

## 2023-10-27 DIAGNOSIS — Z91048 Other nonmedicinal substance allergy status: Secondary | ICD-10-CM

## 2023-10-27 DIAGNOSIS — Z87440 Personal history of urinary (tract) infections: Secondary | ICD-10-CM

## 2023-10-27 DIAGNOSIS — I480 Paroxysmal atrial fibrillation: Secondary | ICD-10-CM | POA: Diagnosis present

## 2023-10-27 DIAGNOSIS — F0390 Unspecified dementia without behavioral disturbance: Secondary | ICD-10-CM | POA: Diagnosis present

## 2023-10-27 DIAGNOSIS — I2510 Atherosclerotic heart disease of native coronary artery without angina pectoris: Secondary | ICD-10-CM | POA: Diagnosis present

## 2023-10-27 DIAGNOSIS — M545 Low back pain, unspecified: Secondary | ICD-10-CM | POA: Diagnosis present

## 2023-10-27 DIAGNOSIS — Z79899 Other long term (current) drug therapy: Secondary | ICD-10-CM

## 2023-10-27 DIAGNOSIS — Z888 Allergy status to other drugs, medicaments and biological substances status: Secondary | ICD-10-CM

## 2023-10-27 DIAGNOSIS — N3001 Acute cystitis with hematuria: Principal | ICD-10-CM

## 2023-10-27 DIAGNOSIS — F1010 Alcohol abuse, uncomplicated: Secondary | ICD-10-CM | POA: Diagnosis present

## 2023-10-27 DIAGNOSIS — Z8249 Family history of ischemic heart disease and other diseases of the circulatory system: Secondary | ICD-10-CM

## 2023-10-27 LAB — I-STAT VENOUS BLOOD GAS, ED
Acid-Base Excess: 4 mmol/L — ABNORMAL HIGH (ref 0.0–2.0)
Bicarbonate: 26.7 mmol/L (ref 20.0–28.0)
Calcium, Ion: 1.13 mmol/L — ABNORMAL LOW (ref 1.15–1.40)
HCT: 47 % (ref 39.0–52.0)
Hemoglobin: 16 g/dL (ref 13.0–17.0)
O2 Saturation: 88 %
Potassium: 4.8 mmol/L (ref 3.5–5.1)
Sodium: 136 mmol/L (ref 135–145)
TCO2: 28 mmol/L (ref 22–32)
pCO2, Ven: 34.8 mmHg — ABNORMAL LOW (ref 44–60)
pH, Ven: 7.493 — ABNORMAL HIGH (ref 7.25–7.43)
pO2, Ven: 50 mmHg — ABNORMAL HIGH (ref 32–45)

## 2023-10-27 LAB — CBC WITH DIFFERENTIAL/PLATELET
Abs Immature Granulocytes: 0.06 10*3/uL (ref 0.00–0.07)
Basophils Absolute: 0.1 10*3/uL (ref 0.0–0.1)
Basophils Relative: 0 %
Eosinophils Absolute: 0 10*3/uL (ref 0.0–0.5)
Eosinophils Relative: 0 %
HCT: 48.4 % (ref 39.0–52.0)
Hemoglobin: 15.8 g/dL (ref 13.0–17.0)
Immature Granulocytes: 0 %
Lymphocytes Relative: 6 %
Lymphs Abs: 0.8 10*3/uL (ref 0.7–4.0)
MCH: 31.5 pg (ref 26.0–34.0)
MCHC: 32.6 g/dL (ref 30.0–36.0)
MCV: 96.6 fL (ref 80.0–100.0)
Monocytes Absolute: 0.9 10*3/uL (ref 0.1–1.0)
Monocytes Relative: 7 %
Neutro Abs: 11.6 10*3/uL — ABNORMAL HIGH (ref 1.7–7.7)
Neutrophils Relative %: 87 %
Platelets: 261 10*3/uL (ref 150–400)
RBC: 5.01 MIL/uL (ref 4.22–5.81)
RDW: 12.9 % (ref 11.5–15.5)
WBC: 13.5 10*3/uL — ABNORMAL HIGH (ref 4.0–10.5)
nRBC: 0 % (ref 0.0–0.2)

## 2023-10-27 LAB — I-STAT CG4 LACTIC ACID, ED
Lactic Acid, Venous: 2.3 mmol/L (ref 0.5–1.9)
Lactic Acid, Venous: 2.1 mmol/L (ref 0.5–1.9)

## 2023-10-27 LAB — URINALYSIS, W/ REFLEX TO CULTURE (INFECTION SUSPECTED)
Bilirubin Urine: NEGATIVE
Glucose, UA: NEGATIVE mg/dL
Ketones, ur: NEGATIVE mg/dL
Leukocytes,Ua: NEGATIVE
Nitrite: NEGATIVE
Protein, ur: NEGATIVE mg/dL
RBC / HPF: >50 RBC/hpf (ref 0–5)
Specific Gravity, Urine: 1.013 (ref 1.005–1.030)
pH: 9 — ABNORMAL HIGH (ref 5.0–8.0)

## 2023-10-27 LAB — COMPREHENSIVE METABOLIC PANEL WITH GFR
ALT: 22 U/L (ref 0–44)
AST: 20 U/L (ref 15–41)
Albumin: 3.6 g/dL (ref 3.5–5.0)
Alkaline Phosphatase: 63 U/L (ref 38–126)
Anion gap: 13 (ref 5–15)
BUN: 15 mg/dL (ref 8–23)
CO2: 23 mmol/L (ref 22–32)
Calcium: 9.7 mg/dL (ref 8.9–10.3)
Chloride: 102 mmol/L (ref 98–111)
Creatinine, Ser: 1.1 mg/dL (ref 0.61–1.24)
GFR, Estimated: >60 mL/min (ref >60)
Glucose, Bld: 111 mg/dL — ABNORMAL HIGH (ref 70–99)
Potassium: 4.7 mmol/L (ref 3.5–5.1)
Sodium: 138 mmol/L (ref 135–145)
Total Bilirubin: 1.5 mg/dL — ABNORMAL HIGH (ref 0.0–1.2)
Total Protein: 7.6 g/dL (ref 6.5–8.1)

## 2023-10-27 LAB — PROTIME-INR
INR: 1.4 — ABNORMAL HIGH (ref 0.8–1.2)
Prothrombin Time: 17.2 s — ABNORMAL HIGH (ref 11.4–15.2)

## 2023-10-27 LAB — AMMONIA: Ammonia: 42 umol/L — ABNORMAL HIGH (ref 9–35)

## 2023-10-27 MED ORDER — TAMSULOSIN HCL 0.4 MG PO CAPS
0.4000 mg | ORAL_CAPSULE | Freq: Every day | ORAL | Status: DC
  Administered 2023-10-29 – 2023-10-31 (×3): 0.4 mg via ORAL
  Filled 2023-10-27 (×4): qty 1

## 2023-10-27 MED ORDER — DONEPEZIL HCL 10 MG PO TABS
10.0000 mg | ORAL_TABLET | Freq: Every day | ORAL | Status: DC
  Administered 2023-10-27 – 2023-10-30 (×4): 10 mg via ORAL
  Filled 2023-10-27: qty 1
  Filled 2023-10-27: qty 2
  Filled 2023-10-27 (×2): qty 1

## 2023-10-27 MED ORDER — AMLODIPINE BESYLATE 5 MG PO TABS: 5.0000 mg | ORAL_TABLET | Freq: Every day | ORAL | Status: DC

## 2023-10-27 MED ORDER — APIXABAN 5 MG PO TABS
5.0000 mg | ORAL_TABLET | Freq: Two times a day (BID) | ORAL | Status: DC
  Administered 2023-10-27: 5 mg via ORAL
  Filled 2023-10-27 (×2): qty 1

## 2023-10-27 MED ORDER — METOPROLOL SUCCINATE ER 25 MG PO TB24
25.0000 mg | ORAL_TABLET | Freq: Every day | ORAL | Status: DC
  Administered 2023-10-27: 25 mg via ORAL
  Filled 2023-10-27: qty 1

## 2023-10-27 MED ORDER — ACETAMINOPHEN 325 MG RE SUPP
325.0000 mg | Freq: Once | RECTAL | Status: AC
  Administered 2023-10-27: 325 mg via RECTAL
  Filled 2023-10-27: qty 1

## 2023-10-27 MED ORDER — MEMANTINE HCL 10 MG PO TABS
10.0000 mg | ORAL_TABLET | Freq: Two times a day (BID) | ORAL | Status: DC
  Administered 2023-10-27 – 2023-10-31 (×7): 10 mg via ORAL
  Filled 2023-10-27 (×8): qty 1

## 2023-10-27 MED ORDER — VANCOMYCIN HCL 1750 MG/350ML IV SOLN: 1750.0000 mg | INTRAVENOUS | Status: DC

## 2023-10-27 MED ORDER — METOPROLOL SUCCINATE ER 25 MG PO TB24: 25.0000 mg | ORAL_TABLET | Freq: Every day | ORAL | Status: DC

## 2023-10-27 MED ORDER — ACETAMINOPHEN 650 MG RE SUPP
650.0000 mg | RECTAL | Status: DC | PRN
  Administered 2023-10-27: 650 mg via RECTAL
  Filled 2023-10-27: qty 1

## 2023-10-27 MED ORDER — POLYVINYL ALCOHOL 1.4 % OP SOLN: 1.0000 [drp] | Freq: Every day | OPHTHALMIC | Status: DC | PRN

## 2023-10-27 MED ORDER — DIGOXIN 125 MCG PO TABS: 0.1250 mg | ORAL_TABLET | Freq: Every day | ORAL | Status: DC

## 2023-10-27 MED ORDER — LACTATED RINGERS IV SOLN: INTRAVENOUS | Status: AC

## 2023-10-27 MED ORDER — SODIUM CHLORIDE 0.9% FLUSH
3.0000 mL | Freq: Two times a day (BID) | INTRAVENOUS | Status: DC
  Administered 2023-10-27 – 2023-10-31 (×9): 3 mL via INTRAVENOUS

## 2023-10-27 MED ORDER — LACTATED RINGERS IV BOLUS (SEPSIS)
1000.0000 mL | Freq: Once | INTRAVENOUS | Status: AC
  Administered 2023-10-27: 1000 mL via INTRAVENOUS

## 2023-10-27 MED ORDER — FINASTERIDE 5 MG PO TABS
5.0000 mg | ORAL_TABLET | Freq: Every day | ORAL | Status: DC
  Administered 2023-10-27 – 2023-10-30 (×4): 5 mg via ORAL
  Filled 2023-10-27 (×4): qty 1

## 2023-10-27 MED ORDER — VANCOMYCIN HCL 2000 MG/400ML IV SOLN
2000.0000 mg | Freq: Once | INTRAVENOUS | Status: AC
  Administered 2023-10-27: 2000 mg via INTRAVENOUS
  Filled 2023-10-27: qty 400

## 2023-10-27 MED ORDER — SODIUM CHLORIDE 0.9 % IV SOLN
2.0000 g | Freq: Three times a day (TID) | INTRAVENOUS | Status: DC
  Administered 2023-10-27 – 2023-10-30 (×8): 2 g via INTRAVENOUS
  Filled 2023-10-27 (×8): qty 12.5

## 2023-10-27 MED ORDER — VANCOMYCIN HCL IN DEXTROSE 1-5 GM/200ML-% IV SOLN: 1000.0000 mg | Freq: Once | INTRAVENOUS | Status: DC

## 2023-10-27 MED ORDER — METRONIDAZOLE 500 MG/100ML IV SOLN
500.0000 mg | Freq: Two times a day (BID) | INTRAVENOUS | Status: DC
  Administered 2023-10-27 – 2023-10-29 (×4): 500 mg via INTRAVENOUS
  Filled 2023-10-27 (×4): qty 100

## 2023-10-27 MED ORDER — ACETAMINOPHEN 650 MG RE SUPP
650.0000 mg | Freq: Four times a day (QID) | RECTAL | Status: DC | PRN
  Administered 2023-10-27 – 2023-10-28 (×3): 650 mg via RECTAL
  Filled 2023-10-27 (×4): qty 1

## 2023-10-27 MED ORDER — SODIUM CHLORIDE 0.9 % IV SOLN
2.0000 g | Freq: Once | INTRAVENOUS | Status: AC
  Administered 2023-10-27: 2 g via INTRAVENOUS
  Filled 2023-10-27: qty 12.5

## 2023-10-27 MED ORDER — METRONIDAZOLE 500 MG/100ML IV SOLN
500.0000 mg | Freq: Once | INTRAVENOUS | Status: AC
  Administered 2023-10-27: 500 mg via INTRAVENOUS
  Filled 2023-10-27: qty 100

## 2023-10-27 MED ORDER — POLYETHYLENE GLYCOL 3350 17 G PO PACK: 17.0000 g | PACK | Freq: Every day | ORAL | Status: DC | PRN

## 2023-10-27 MED ORDER — ACETAMINOPHEN 325 MG PO TABS
650.0000 mg | ORAL_TABLET | Freq: Four times a day (QID) | ORAL | Status: DC | PRN
Start: 2023-10-27 — End: 2023-10-31
  Administered 2023-10-28 – 2023-10-31 (×6): 650 mg via ORAL
  Filled 2023-10-27 (×7): qty 2

## 2023-10-27 MED ORDER — UMECLIDINIUM BROMIDE 62.5 MCG/ACT IN AEPB
1.0000 | INHALATION_SPRAY | Freq: Every day | RESPIRATORY_TRACT | Status: DC
  Administered 2023-10-30 – 2023-10-31 (×2): 1 via RESPIRATORY_TRACT
  Filled 2023-10-27: qty 7

## 2023-10-27 NOTE — ED Notes (Signed)
 Called dietary for tray.

## 2023-10-27 NOTE — ED Notes (Signed)
 Output urine

## 2023-10-27 NOTE — ED Notes (Signed)
 Paged admitting floor coverage Dr. [facility] concerning temp 102.1 despite rectal tylenol  given. Pt HR ST 120s. Pts wife also sts concern for worsening confusion

## 2023-10-27 NOTE — ED Provider Notes (Signed)
 [facility] Provider Note   CSN: [id number] Arrival date & time: 10/27/23  0813     History {Add pertinent medical, surgical, social history, OB history to HPI:1} Chief Complaint  Patient presents with   Fever   Altered Mental Status    Erik [name] is a 83 y.o. male.   Fever Altered Mental Status Associated symptoms: fever      Patient has a history of atrial flutter hypertension coronary artery disease, TIA, stroke who presents ED for altered mental status.  According to the EMS report patient started experiencing fever and confusion last evening.  Patient's wife initially noticed some of the symptoms about 10:30 PM.  This morning his symptoms were worse.  He reportedly did not recognize his wife.  Patient in the ED looks at me but has a hard time answering questions.  He reports his birthdate but cannot tell me what brought him to the ED. Patient wife states patient has had trouble with recurrent UTIs.  He also has been having trouble with his speech ongoing now for over the last month.  Patient's Mini-Mental status exam on the ninth was 14 out of 30  Home Medications Prior to Admission medications   Medication Sig Start Date End Date Taking? Authorizing Provider  acetaminophen  (TYLENOL ) 500 MG tablet Take 500-1,000 mg by mouth every 6 (six) hours as needed (pain).    [provider]  amLODipine  (NORVASC ) 5 MG tablet Take 1 tablet (5 mg total) by mouth daily. 06/16/20   Tammie Fall, MD  Carboxymethylcellulose Sodium (THERATEARS) 0.25 % SOLN Place 1 drop into both eyes daily as needed (dry eyes).    [provider]  cephALEXin  (KEFLEX ) 500 MG capsule Take 1 capsule (500 mg total) by mouth 4 (four) times daily. 09/22/23   Sofia, Leslie K, PA-C  Cholecalciferol (VITAMIN D3) 50 MCG (2000 UT) TABS Take by mouth.    [provider]  digoxin  (LANOXIN ) 0.125 MG tablet TAKE 1 TABLET BY MOUTH DAILY. EXCEPT ON  SUNDAY 07/12/23   Tammie Fall, MD  donepezil  (ARICEPT ) 10 MG tablet Take 10 mg by mouth at bedtime. 07/04/18   [provider]  ELIQUIS  5 MG TABS tablet TAKE 1 TABLET BY MOUTH TWICE A DAY Patient taking differently: Take 5 mg by mouth 2 (two) times daily. 03/21/18   Tammie Fall, MD  FIBER PO Take 1 capsule by mouth daily.    [provider]  finasteride  (PROSCAR ) 5 MG tablet Take 5 mg by mouth at bedtime.  08/08/17   [provider]  furosemide (LASIX) 20 MG tablet Take 20 mg by mouth daily as needed. 04/05/22   [provider]  INCRUSE ELLIPTA  62.5 MCG/INH AEPB Take 1 puff by mouth daily. 01/29/17   [provider]  memantine  (NAMENDA ) 10 MG tablet Take one tablet twice a day 09/28/23   Wertman, Sara E, PA-C  metoprolol  succinate (TOPROL -XL) 25 MG 24 hr tablet TAKE 1 TABLET(25 MG) BY MOUTH DAILY 05/21/23   Tammie Fall, MD  Multiple Vitamins-Minerals (PRESERVISION AREDS 2+MULTI VIT PO) Take 1 capsule by mouth in the morning and at bedtime.    [provider]  nitroGLYCERIN  (NITROSTAT ) 0.4 MG SL tablet Place 1 tablet (0.4 mg total) under the tongue every 5 (five) minutes as needed for chest pain (MAX 3 TABLETS). 04/28/21   Tammie Fall, MD  polyethylene glycol (MIRALAX  / GLYCOLAX ) 17 g packet Take 17 g by  mouth daily as needed for mild constipation. 06/15/20   Edsel Grace, MD  tamsulosin  (FLOMAX ) 0.4 MG CAPS capsule Take 1 capsule (0.4 mg total) by mouth daily after breakfast. 06/15/20   Edsel Grace, MD      Allergies    Albuterol  sulfate [albuterol ], Hyoscyamine sulfate, Adhesive [tape], and Warfarin and related    Review of Systems   Review of Systems  Constitutional:  Positive for fever.    Physical Exam Updated Vital Signs There were no vitals taken for this visit. Physical Exam Vitals and nursing note reviewed.  Constitutional:      General: He is not in acute distress.    Appearance: He is well-developed.  HENT:      Head: Normocephalic and atraumatic.     Right Ear: External ear normal.     Left Ear: External ear normal.  Eyes:     General: No scleral icterus.       Right eye: No discharge.        Left eye: No discharge.     Conjunctiva/sclera: Conjunctivae normal.  Neck:     Trachea: No tracheal deviation.  Cardiovascular:     Rate and Rhythm: Normal rate and regular rhythm.  Pulmonary:     Effort: Pulmonary effort is normal. No respiratory distress.     Breath sounds: Normal breath sounds. No stridor. No wheezing or rales.  Abdominal:     General: Bowel sounds are normal. There is no distension.     Palpations: Abdomen is soft.     Tenderness: There is no abdominal tenderness. There is no guarding or rebound.  Musculoskeletal:        General: No tenderness or deformity.     Cervical back: Neck supple.  Skin:    General: Skin is warm and dry.     Findings: No rash.  Neurological:     General: No focal deficit present.     Mental Status: He is confused.     GCS: GCS eye subscore is 4. GCS verbal subscore is 3. GCS motor subscore is 6.     Cranial Nerves: No cranial nerve deficit, dysarthria or facial asymmetry.     Sensory: No sensory deficit.     Motor: Weakness present. No abnormal muscle tone or seizure activity.     Coordination: Coordination normal.     Comments: Patient has difficulty following commands completely.  Does not spontaneously keep his arms and legs elevated when asked to do so but does comply somewhat if I assist him.  Able to hold both arms off the bed against gravity without difficulty with lifting them off the bed when asked to do so, able to hold both legs off the bed against gravity but only slightly.  No clear facial asymmetry, no dysarthria, questionable aphasia as patient only saying his birthdate  Psychiatric:        Mood and Affect: Mood normal.     ED Results / Procedures / Treatments   Labs (all labs ordered are listed, but only abnormal results are  displayed) Labs Reviewed  CULTURE, BLOOD (ROUTINE X 2)  CULTURE, BLOOD (ROUTINE X 2)  COMPREHENSIVE METABOLIC PANEL WITH GFR  CBC WITH DIFFERENTIAL/PLATELET  PROTIME-INR  URINALYSIS, W/ REFLEX TO CULTURE (INFECTION SUSPECTED)  AMMONIA  I-STAT CG4 LACTIC ACID, ED  I-STAT VENOUS BLOOD GAS, ED    EKG None  Radiology No results found.  Procedures Procedures  {Document cardiac monitor, telemetry assessment procedure when appropriate:1}  Medications Ordered in ED  Medications - No data to display  ED Course/ Medical Decision Making/ A&P Clinical Course as of 10/27/23 1227  Sat Oct 27, 2023  0850 Temp 102.6 [JK]  1049 Head CT without acute abnormalities [JK]  1049 Chest x-ray without acute findings [JK]  1053 Lactic acid level elevated.  Remains normotensive.  Will give fluid boluses empiric antibiotics. [JK]  1226 Urinalysis does show 6-10 whites greater than 50 red blood cells with bacteria.  Possible source of infection. [JK]    Clinical Course User Index [JK] Trish Furl, MD   {   Click here for ABCD2, HEART and other calculatorsREFRESH Note before signing :1}                              Medical Decision Making Problems Addressed: Acute cystitis with hematuria: acute illness or injury that poses a threat to life or bodily functions  Amount and/or Complexity of Data Reviewed Labs: ordered. Decision-making details documented in ED Course. Radiology: ordered and independent interpretation performed.  Risk OTC drugs. Prescription drug management. Decision regarding hospitalization.   Patient present to the ED for evaluation of fever worsening confusion.  Patient noted to have a temp of 102.6.  Patient does have an elevated lactic acid level but no signs of hypotension.  ED workup does not show any pneumonia.  No acute abnormality noted on head CT.  Chest x-ray without signs of pneumonia.  Patient does not have any meningismus.  He is confused but this appears to be his  baseline.  Patient's urinalysis is not abnormal.  Suspect he might have a UTI causing his symptoms.  Patient has been started on IV antibiotics.  I will consult the medical service for admission  {Document critical care time when appropriate:1} {Document review of labs and clinical decision tools ie heart score, Chads2Vasc2 etc:1}  {Document your independent review of radiology images, and any outside records:1} {Document your discussion with family members, caretakers, and with consultants:1} {Document social determinants of health affecting pt's care:1} {Document your decision making why or why not admission, treatments were needed:1} Final Clinical Impression(s) / ED Diagnoses Final diagnoses:  None    Rx / DC Orders ED Discharge Orders     None

## 2023-10-27 NOTE — H&P (Signed)
 History and Physical   HERRON FERO AOZ:[id number] DOB: 12-23-1940 DOA: 10/27/2023  PCP: Benedetto Brady, MD   Patient coming from: Home  Chief Complaint: Fever, confusion  HPI: Erik [name] is a 83 y.o. male with medical history significant of dementia, hypertension, hyperlipidemia, TIA, atrial fibrillation, CAD, bradycardia, BPH, COPD, alcohol use, periodic limb movement disorder, low back pain, urinary incontinence presenting with fever and confusion.  Patient first noted to have significant fever and confusion last night.  Per family this was first noticed around 10:30 PM.  Symptoms worsened this morning to the point where patient did not recognize his wife and was having increased trouble answering questions.  Family reports patient has had a history of recurrent UTI.  He has had some ongoing issues with dementia which has been worsening and worsening issues with his speech for the past month.  Did see neurology a month ago and his medications were adjusted, MRI on 4/18 did not show explanation for these progressive changes to suggest anything other than progressive dementia.  ED Course: Consult in the ED notable for fever 102.6, blood pressure in the 140s systolic.  Lab workup included CMP with glucose 111, T. bili 1.5.  CBC with leukocytosis to 13.5.  PT and INR elevated at 17.2 and 1.4 Spectrobid.  Lactic acid mildly elevated at 2.3, pending.  CT head showed no acute abnormality.  Chest x-ray showed no acute abnormality.  VBG showed pH 7.49PCO 234.8.  Ammonia level 42.  Urinalysis with hemoglobin, normal white blood cells, rare bacteria blood cultures pending.  Patient received IV medicine, cefepime, Flagyl in the ED as well as 2 L IV fluids and started on IV fluids.  Has received Tylenol .  Review of Systems: As per HPI otherwise all other systems reviewed and are negative.  Past Medical History:  Diagnosis Date   Arthritis    both knees   Atrial fibrillation (HCC)    Paroxysmal,  s/p PVI 11/11/10   Atrial flutter (HCC)    s/p RFCA  by GT   Basal cell carcinoma of ear    CAD (coronary artery disease)    a. cath 5/12: pLAD 25%, D1 25%, EF 65 % (done 2/2 abnormal myoview  with inferapical  ischemia)   Dyslipidemia    Dysrhythmia    HTN (hypertension)    Hx of echocardiogram    Echo (9/15):  Mild LVH, EF 55-60%, no RWMA, normal diast function, mild MR, mod LAE, mildly increased pulmonary pressures (PASP 30 mmHg).   Hypertension    Shortness of breath dyspnea    has a low heart rate   Snoring    a. sleep study 11/2012 neg for OSA; increased leg movement suspicious for primary movement d/o (ie restless leg syndrome)    Stroke (HCC)    mild stroke with  second heart ablation   TIA (transient ischemic attack)    11/12/10 post afib ablation    Past Surgical History:  Procedure Laterality Date   atrial flutter ablation     by GT   basal cell cancer resection     CARDIOVERSION  10/31/2011   Procedure: CARDIOVERSION;  Surgeon: Mardell Shade, MD;  Location: [facility] ENDOSCOPY;  Service: Cardiovascular;  Laterality: N/A;   CATARACT EXTRACTION, BILATERAL     COLONOSCOPY     EYE SURGERY     growth under lid- x2   HEMORRHOID SURGERY     KNEE ARTHROSCOPY Bilateral    bilateral   SHOULDER ARTHROSCOPY Right 08/04/2015  Procedure: ARTHROSCOPY RIGHT SHOULDER;  Surgeon: Wendolyn Hamburger, MD;  Location: [facility] OR;  Service: Orthopedics;  Laterality: Right;   TEE WITHOUT CARDIOVERSION  10/31/2011   Procedure: TRANSESOPHAGEAL ECHOCARDIOGRAM (TEE);  Surgeon: Mardell Shade, MD;  Location: [facility] ENDOSCOPY;  Service: Cardiovascular;  Laterality: N/A;    Social History  reports that he quit smoking about 48 years ago. His smoking use included cigarettes. He started smoking about 68 years ago. He has a 30 pack-year smoking history. He has never used smokeless tobacco. He reports that he does not drink alcohol and does not use drugs.  Allergies  Allergen Reactions   Albuterol  Sulfate  [Albuterol ] Other (See Comments)    rapid heartbeat; afib   Hyoscyamine Sulfate     Other reaction(s): urinary retention   Adhesive [Tape] Rash and Other (See Comments)    New heart monitor/patch (adhesive) broke out the skin and left a large red patch (still visible after 3 weeks   Warfarin And Related Other (See Comments)    Patient's INR could never get regulated while on this    Family History  Problem Relation Age of Onset   Hypertension Mother    Stroke Mother    Heart disease Mother    Arthritis Mother    Heart attack Neg Hx   Reviewed on admission  Prior to Admission medications   Medication Sig Start Date End Date Taking? Authorizing Provider  acetaminophen  (TYLENOL ) 500 MG tablet Take 500-1,000 mg by mouth every 6 (six) hours as needed (pain).    [provider]  amLODipine  (NORVASC ) 5 MG tablet Take 1 tablet (5 mg total) by mouth daily. 06/16/20   Tammie Fall, MD  Carboxymethylcellulose Sodium (THERATEARS) 0.25 % SOLN Place 1 drop into both eyes daily as needed (dry eyes).    [provider]  cephALEXin  (KEFLEX ) 500 MG capsule Take 1 capsule (500 mg total) by mouth 4 (four) times daily. 09/22/23   Sofia, Leslie K, PA-C  Cholecalciferol (VITAMIN D3) 50 MCG (2000 UT) TABS Take by mouth.    [provider]  digoxin  (LANOXIN ) 0.125 MG tablet TAKE 1 TABLET BY MOUTH DAILY. EXCEPT ON SUNDAY 07/12/23   Tammie Fall, MD  donepezil  (ARICEPT ) 10 MG tablet Take 10 mg by mouth at bedtime. 07/04/18   [provider]  ELIQUIS  5 MG TABS tablet TAKE 1 TABLET BY MOUTH TWICE A DAY Patient taking differently: Take 5 mg by mouth 2 (two) times daily. 03/21/18   Tammie Fall, MD  FIBER PO Take 1 capsule by mouth daily.    [provider]  finasteride  (PROSCAR ) 5 MG tablet Take 5 mg by mouth at bedtime.  08/08/17   [provider]  furosemide (LASIX) 20 MG tablet Take 20 mg by mouth daily as needed. 04/05/22   [provider]   INCRUSE ELLIPTA  62.5 MCG/INH AEPB Take 1 puff by mouth daily. 01/29/17   [provider]  memantine  (NAMENDA ) 10 MG tablet Take one tablet twice a day 09/28/23   Wertman, Sara E, PA-C  metoprolol  succinate (TOPROL -XL) 25 MG 24 hr tablet TAKE 1 TABLET(25 MG) BY MOUTH DAILY 05/21/23   Tammie Fall, MD  Multiple Vitamins-Minerals (PRESERVISION AREDS 2+MULTI VIT PO) Take 1 capsule by mouth in the morning and at bedtime.    [provider]  nitroGLYCERIN  (NITROSTAT ) 0.4 MG SL tablet Place 1 tablet (0.4 mg total) under the tongue every 5 (five) minutes as needed for chest pain (MAX 3 TABLETS). 04/28/21  Tammie Fall, MD  polyethylene glycol (MIRALAX  / GLYCOLAX ) 17 g packet Take 17 g by mouth daily as needed for mild constipation. 06/15/20   Edsel Grace, MD  tamsulosin  (FLOMAX ) 0.4 MG CAPS capsule Take 1 capsule (0.4 mg total) by mouth daily after breakfast. 06/15/20   Edsel Grace, MD    Physical Exam: Vitals:   10/27/23 0845 10/27/23 0846 10/27/23 1230  BP: (!) 141/95  (!) 143/81  Pulse: 96  99  Resp: 13  12  Temp:  (!) 102.6 F (39.2 C)   TempSrc:  Rectal   SpO2: 96%  98%    Physical Exam Constitutional:      General: He is not in acute distress.    Appearance: Normal appearance.  HENT:     Head: Normocephalic and atraumatic.     Mouth/Throat:     Mouth: Mucous membranes are moist.     Pharynx: Oropharynx is clear.  Eyes:     Extraocular Movements: Extraocular movements intact.     Pupils: Pupils are equal, round, and reactive to light.  Cardiovascular:     Rate and Rhythm: Normal rate and regular rhythm.     Pulses: Normal pulses.     Heart sounds: Normal heart sounds.  Pulmonary:     Effort: Pulmonary effort is normal. No respiratory distress.     Breath sounds: Normal breath sounds.  Abdominal:     General: Bowel sounds are normal. There is no distension.     Palpations: Abdomen is soft.     Tenderness: There is no abdominal tenderness.   Musculoskeletal:        General: No swelling or deformity.  Skin:    General: Skin is warm and dry.  Neurological:     General: No focal deficit present.     Mental Status: He is disoriented.     Comments: Near baseline, but somewhat worse per family    Labs on Admission: I have personally reviewed following labs and imaging studies  CBC: Recent Labs  Lab 10/27/23 0914 10/27/23 0922  WBC 13.5*  --   NEUTROABS 11.6*  --   HGB 15.8 16.0  HCT 48.4 47.0  MCV 96.6  --   PLT 261  --     Basic Metabolic Panel: Recent Labs  Lab 10/27/23 0914 10/27/23 0922  NA 138 136  K 4.7 4.8  CL 102  --   CO2 23  --   GLUCOSE 111*  --   BUN 15  --   CREATININE 1.10  --   CALCIUM 9.7  --     GFR: CrCl cannot be calculated (Unknown ideal weight.).  Liver Function Tests: Recent Labs  Lab 10/27/23 0914  AST 20  ALT 22  ALKPHOS 63  BILITOT 1.5*  PROT 7.6  ALBUMIN 3.6    Urine analysis:    Component Value Date/Time   COLORURINE YELLOW 10/27/2023 1154   APPEARANCEUR CLOUDY (A) 10/27/2023 1154   LABSPEC 1.013 10/27/2023 1154   PHURINE 9.0 (H) 10/27/2023 1154   GLUCOSEU NEGATIVE 10/27/2023 1154   HGBUR MODERATE (A) 10/27/2023 1154   BILIRUBINUR NEGATIVE 10/27/2023 1154   KETONESUR NEGATIVE 10/27/2023 1154   PROTEINUR NEGATIVE 10/27/2023 1154   UROBILINOGEN 0.2 11/12/2010 1949   NITRITE NEGATIVE 10/27/2023 1154   LEUKOCYTESUR NEGATIVE 10/27/2023 1154    Radiological Exams on Admission: CT Head Wo Contrast Result Date: 10/27/2023 CLINICAL DATA:  Mental status change, unknown cause EXAM: CT HEAD WITHOUT CONTRAST TECHNIQUE: Contiguous axial images were  obtained from the base of the skull through the vertex without intravenous contrast. RADIATION DOSE REDUCTION: This exam was performed according to the departmental dose-optimization program which includes automated exposure control, adjustment of the mA and/or kV according to patient size and/or use of iterative reconstruction  technique. COMPARISON:  CT head 09/22/2023 FINDINGS: Brain: No evidence of large-territorial acute infarction. No parenchymal hemorrhage. No mass lesion. No extra-axial collection. No mass effect or midline shift. No hydrocephalus. Basilar cisterns are patent. Vascular: No hyperdense vessel. Skull: No acute fracture or focal lesion. Sinuses/Orbits: Paranasal sinuses and mastoid air cells are clear. The orbits are unremarkable. Other: None. IMPRESSION: No acute intracranial abnormality. Electronically Signed   By: Morgane  Naveau M.D.   On: 10/27/2023 10:24   DG Chest Port 1 View Result Date: 10/27/2023 CLINICAL DATA:  Sepsis. EXAM: PORTABLE CHEST 1 VIEW COMPARISON:  Chest radiograph dated 05/03/2023 FINDINGS: With shallow inspiration. No focal consolidation, pleural effusion, pneumothorax the cardiac silhouette is within normal limits. No acute osseous pathology. IMPRESSION: No active disease. Electronically Signed   By: Angus Bark M.D.   On: 10/27/2023 09:32   EKG: Independently reviewed.  Atrial fibrillation with T wave changes.  Assessment/Plan Active Problems:   Essential hypertension   Paroxysmal atrial fibrillation (HCC)   CAD (coronary artery disease)   History of TIA (transient ischemic attack)   Periodic limb movement disorder (PLMD)   ETOH abuse   Chronic obstructive pulmonary disease (HCC)   Enlarged prostate   Hyperlipidemia   Dementia (HCC)   Low back pain   Encephalopathy SIRS > Presenting with fever and acute on chronic confusion. > Has baseline progressive dementia, worse in the last month now with fever starting last night and worsening confusion to where he was unable to recognize wife this morning. > Leukocytosis 13.5, T. bili one 2.6.  No obvious source identified on CT head, chest x-ray.  Possible UTI as he has a history of this but only rare bacteria and minimal WBCs on urinalysis.  No evidence of meningismus. > Some of his acute on chronic confusion could be due  to his chronic fevers, currently 102.6 in the ED.  No focal deficits > No specific abdominal complaints to warrant further imaging - Monitor on progressive unit overnight pending-continue with broad-spectrum antibiotics: Vancomycin, cefepime, Flagyl - Follow-up urine culture, blood cultures - Trend fever curve and WBC - Continue IV fluids - Supportive care  Hypertension - Continue metoprolol   Hyperlipidemia - Not on medication for this.  History of TIA - Continue home Eliquis   Atrial fibrillation - Continue home metoprolol , digoxin , Eliquis   CAD - Continue metoprolol , Eliquis   BPH - Continue home finasteride , tamsulosin   COPD - Continue home Incruse  Dementia > As noted above has had progressive issues with his dementia. > Some issues with speech for the past month.  > Followed up with neurology about a month ago with medication adjustments.  MRI on 4/18 without escalation beyond dementia for his progressive symptoms. - Noted  History of alcohol use - Noted  DVT prophylaxis: Eliquis  Code Status:   Full Family Communication:  Updated at bedside  Disposition Plan:   Patient is from:  Home  Anticipated DC to:  Home  Anticipated DC date:  1 to 4 days  Anticipated DC barriers: None  Consults called:  None Admission status:  Observation Progress  Severity of Illness: The appropriate patient status for this patient is OBSERVATION. Observation status is judged to be reasonable and necessary in order to provide  the required intensity of service to ensure the patient's safety. The patient's presenting symptoms, physical exam findings, and initial radiographic and laboratory data in the context of their medical condition is felt to place them at decreased risk for further clinical deterioration. Furthermore, it is anticipated that the patient will be medically stable for discharge from the hospital within 2 midnights of admission.    Johnetta Nab MD [facility]  How to contact the [facility] Attending or Consulting provider 7A - 7P or covering provider during after hours 7P -7A, for this patient?   Check the care team in [facility] and look for a) attending/consulting [facility] provider listed and b) the [facility] team listed Log into [email or web address] and use [place]'s universal password to access. If you do not have the password, please contact the hospital operator. Locate the [facility] provider you are looking for under [facility] and page to a number that you can be directly reached. If you still have difficulty reaching the provider, please page the [facility] (Director on Call) for the Hospitalists listed on amion for assistance.  10/27/2023, 12:52 PM

## 2023-10-27 NOTE — Progress Notes (Signed)
 Pharmacy Antibiotic Note  Erik [name] is a 83 y.o. male for which pharmacy has been consulted for cefepime and vancomycin dosing for sepsis.  SCr 1.1 WBC 13.5; LA 2.1; T 98.7; HR 82; RR 18  Plan: Metronidazole per MD Cefepime 2g q8hr  Vancomycin 2000 mg once then 1750 mg q24hr (eAUC 491.3) unless change in renal function Monitor WBC, fever, renal function, cultures De-escalate when able Levels at steady state     Temp (24hrs), Avg:102.6 F (39.2 C), Min:102.6 F (39.2 C), Max:102.6 F (39.2 C)  Recent Labs  Lab 10/27/23 0914 10/27/23 0922 10/27/23 1322  WBC 13.5*  --   --   CREATININE 1.10  --   --   LATICACIDVEN  --  2.3* 2.1*    CrCl cannot be calculated (Unknown ideal weight.).    Allergies  Allergen Reactions   Albuterol  Sulfate [Albuterol ] Other (See Comments)    rapid heartbeat; afib   Hyoscyamine Sulfate     Other reaction(s): urinary retention   Adhesive [Tape] Rash and Other (See Comments)    New heart monitor/patch (adhesive) broke out the skin and left a large red patch (still visible after 3 weeks   Warfarin And Related Other (See Comments)    Patient's INR could never get regulated while on this   Microbiology results: Pending  Thank you for allowing pharmacy to be a part of this patient's care.  Dionicio Fray, PharmD, BCPS 10/27/2023 1:26 PM ED Clinical Pharmacist -  [phone]

## 2023-10-27 NOTE — ED Triage Notes (Signed)
 Patient BIB [facility] from home for AMS and fever. Patient's wife noticed he was altered last night at 2230, AMS was worse this morning, patient did not recognize her. EMS reports patient is hot to touch, recently treated for UTI by PCP. Patient A&Ox0 at triage.

## 2023-10-28 ENCOUNTER — Observation Stay (HOSPITAL_COMMUNITY)

## 2023-10-28 ENCOUNTER — Other Ambulatory Visit (HOSPITAL_COMMUNITY)

## 2023-10-28 DIAGNOSIS — I482 Chronic atrial fibrillation, unspecified: Secondary | ICD-10-CM | POA: Diagnosis present

## 2023-10-28 DIAGNOSIS — I2510 Atherosclerotic heart disease of native coronary artery without angina pectoris: Secondary | ICD-10-CM | POA: Diagnosis present

## 2023-10-28 DIAGNOSIS — G9341 Metabolic encephalopathy: Secondary | ICD-10-CM | POA: Diagnosis present

## 2023-10-28 DIAGNOSIS — I480 Paroxysmal atrial fibrillation: Secondary | ICD-10-CM | POA: Diagnosis present

## 2023-10-28 DIAGNOSIS — Z8249 Family history of ischemic heart disease and other diseases of the circulatory system: Secondary | ICD-10-CM | POA: Diagnosis not present

## 2023-10-28 DIAGNOSIS — Z87440 Personal history of urinary (tract) infections: Secondary | ICD-10-CM | POA: Diagnosis not present

## 2023-10-28 DIAGNOSIS — M545 Low back pain, unspecified: Secondary | ICD-10-CM | POA: Diagnosis present

## 2023-10-28 DIAGNOSIS — Z8673 Personal history of transient ischemic attack (TIA), and cerebral infarction without residual deficits: Secondary | ICD-10-CM | POA: Diagnosis not present

## 2023-10-28 DIAGNOSIS — J449 Chronic obstructive pulmonary disease, unspecified: Secondary | ICD-10-CM | POA: Diagnosis present

## 2023-10-28 DIAGNOSIS — E785 Hyperlipidemia, unspecified: Secondary | ICD-10-CM | POA: Diagnosis present

## 2023-10-28 DIAGNOSIS — Z7901 Long term (current) use of anticoagulants: Secondary | ICD-10-CM | POA: Diagnosis not present

## 2023-10-28 DIAGNOSIS — Z91048 Other nonmedicinal substance allergy status: Secondary | ICD-10-CM | POA: Diagnosis not present

## 2023-10-28 DIAGNOSIS — Z888 Allergy status to other drugs, medicaments and biological substances status: Secondary | ICD-10-CM | POA: Diagnosis not present

## 2023-10-28 DIAGNOSIS — R791 Abnormal coagulation profile: Secondary | ICD-10-CM | POA: Diagnosis present

## 2023-10-28 DIAGNOSIS — R6510 Systemic inflammatory response syndrome (SIRS) of non-infectious origin without acute organ dysfunction: Secondary | ICD-10-CM | POA: Diagnosis not present

## 2023-10-28 DIAGNOSIS — F0390 Unspecified dementia without behavioral disturbance: Secondary | ICD-10-CM | POA: Diagnosis present

## 2023-10-28 DIAGNOSIS — B9620 Unspecified Escherichia coli [E. coli] as the cause of diseases classified elsewhere: Secondary | ICD-10-CM | POA: Diagnosis present

## 2023-10-28 DIAGNOSIS — N401 Enlarged prostate with lower urinary tract symptoms: Secondary | ICD-10-CM | POA: Diagnosis present

## 2023-10-28 DIAGNOSIS — Z87891 Personal history of nicotine dependence: Secondary | ICD-10-CM | POA: Diagnosis not present

## 2023-10-28 DIAGNOSIS — Z85828 Personal history of other malignant neoplasm of skin: Secondary | ICD-10-CM | POA: Diagnosis not present

## 2023-10-28 DIAGNOSIS — N390 Urinary tract infection, site not specified: Secondary | ICD-10-CM | POA: Diagnosis present

## 2023-10-28 DIAGNOSIS — Z823 Family history of stroke: Secondary | ICD-10-CM | POA: Diagnosis not present

## 2023-10-28 DIAGNOSIS — F1010 Alcohol abuse, uncomplicated: Secondary | ICD-10-CM | POA: Diagnosis present

## 2023-10-28 DIAGNOSIS — R509 Fever, unspecified: Secondary | ICD-10-CM | POA: Diagnosis present

## 2023-10-28 DIAGNOSIS — I10 Essential (primary) hypertension: Secondary | ICD-10-CM | POA: Diagnosis present

## 2023-10-28 DIAGNOSIS — G4761 Periodic limb movement disorder: Secondary | ICD-10-CM | POA: Diagnosis present

## 2023-10-28 LAB — CBC
HCT: 44.7 % (ref 39.0–52.0)
Hemoglobin: 14.7 g/dL (ref 13.0–17.0)
MCH: 31.4 pg (ref 26.0–34.0)
MCHC: 32.9 g/dL (ref 30.0–36.0)
MCV: 95.5 fL (ref 80.0–100.0)
Platelets: 228 10*3/uL (ref 150–400)
RBC: 4.68 MIL/uL (ref 4.22–5.81)
RDW: 13 % (ref 11.5–15.5)
WBC: 11.8 10*3/uL — ABNORMAL HIGH (ref 4.0–10.5)
nRBC: 0 % (ref 0.0–0.2)

## 2023-10-28 LAB — COMPREHENSIVE METABOLIC PANEL WITH GFR
ALT: 18 U/L (ref 0–44)
AST: 18 U/L (ref 15–41)
Albumin: 2.9 g/dL — ABNORMAL LOW (ref 3.5–5.0)
Alkaline Phosphatase: 54 U/L (ref 38–126)
Anion gap: 10 (ref 5–15)
BUN: 11 mg/dL (ref 8–23)
CO2: 22 mmol/L (ref 22–32)
Calcium: 9 mg/dL (ref 8.9–10.3)
Chloride: 101 mmol/L (ref 98–111)
Creatinine, Ser: 1.03 mg/dL (ref 0.61–1.24)
GFR, Estimated: >60 mL/min (ref >60)
Glucose, Bld: 118 mg/dL — ABNORMAL HIGH (ref 70–99)
Potassium: 4.3 mmol/L (ref 3.5–5.1)
Sodium: 133 mmol/L — ABNORMAL LOW (ref 135–145)
Total Bilirubin: 1.4 mg/dL — ABNORMAL HIGH (ref 0.0–1.2)
Total Protein: 6.5 g/dL (ref 6.5–8.1)

## 2023-10-28 LAB — PROTIME-INR
INR: 1.5 — ABNORMAL HIGH (ref 0.8–1.2)
Prothrombin Time: 18.3 s — ABNORMAL HIGH (ref 11.4–15.2)

## 2023-10-28 LAB — CORTISOL-AM, BLOOD: Cortisol - AM: 18.2 ug/dL (ref 6.7–22.6)

## 2023-10-28 MED ORDER — APIXABAN 5 MG PO TABS
5.0000 mg | ORAL_TABLET | Freq: Two times a day (BID) | ORAL | Status: DC
  Administered 2023-10-29 – 2023-10-31 (×6): 5 mg via ORAL
  Filled 2023-10-28 (×6): qty 1

## 2023-10-28 MED ORDER — ENOXAPARIN SODIUM 40 MG/0.4ML IJ SOSY
40.0000 mg | PREFILLED_SYRINGE | INTRAMUSCULAR | Status: DC
  Administered 2023-10-28: 40 mg via SUBCUTANEOUS
  Filled 2023-10-28: qty 0.4

## 2023-10-28 MED ORDER — DIGOXIN 0.25 MG/ML IJ SOLN
0.1250 mg | Freq: Every day | INTRAMUSCULAR | Status: DC
  Administered 2023-10-29 – 2023-10-30 (×2): 0.125 mg via INTRAVENOUS
  Filled 2023-10-28 (×2): qty 0.5

## 2023-10-28 MED ORDER — LACTATED RINGERS IV SOLN: INTRAVENOUS | Status: DC

## 2023-10-28 MED ORDER — METOPROLOL SUCCINATE ER 25 MG PO TB24
25.0000 mg | ORAL_TABLET | Freq: Every day | ORAL | Status: DC
  Administered 2023-10-28 – 2023-10-30 (×3): 25 mg via ORAL
  Filled 2023-10-28 (×3): qty 1

## 2023-10-28 MED ORDER — METOPROLOL TARTRATE 5 MG/5ML IV SOLN
5.0000 mg | Freq: Three times a day (TID) | INTRAVENOUS | Status: DC
  Administered 2023-10-28: 5 mg via INTRAVENOUS
  Filled 2023-10-28: qty 5

## 2023-10-28 NOTE — Progress Notes (Signed)
 Erik [name]  MWU:[id number] DOB: [birth year] DOA: 10/27/2023 PCP: Benedetto Brady, MD    Brief Narrative:  83 year old with a history of dementia, HTN, HLD, TIA, atrial fibrillation, CAD, bradycardia, BPH, COPD, alcohol use, periodic limb movement disorder, urinary incontinence with recurrent UTIs, and chronic low back pain who was brought to the ER 10/27/2023 by family after they noticed he was significantly confused and appeared to have a fever.  In the ER his temperature was 102.6 with an elevated WBC at 13.5.  CT head was without acute finding.  CXR was with no focal infiltrate.  Ammonia level was 42.  UA was not convincing of UTI.  Goals of Care:   Code Status: Full Code   DVT prophylaxis:  apixaban  (ELIQUIS ) tablet 5 mg   Interim Hx: Fever has recurred since admission with Tmax of 102.6.  Blood pressure stable.  Vital stable otherwise.  Oxygenation 97% on room air.  The patient is alert but confused at the time of my exam.  He does not appear uncomfortable.  There is no respiratory distress.  His abdominal exam is remarkably benign.  Assessment & Plan:  Acute encephalopathy on background of dementia of unclear etiology Has been undergoing outpatient evaluation by neurology for his dementia -suspected metabolic encephalopathy related to fever of unknown origin -monitor clinically -complete metabolic workup with B12 folic acid ammonia level and TSH  Fever of unknown origin CXR unrevealing -CT head unrevealing -UA not convincing of UTI presently though a urine cx was + for pansensitive E coli on 10/23/23 - -placed on broad-spectrum antibiotics at time of admission -follow blood cultures and urine culture -has known diverticulosis as noted on CT abdomen 09/22/2023 so diverticulitis is certainly in the differential, but abdom exam is completely benign - consider CT abd/pelvis if cultures do not otherwise point to a source - the patient has been on a course of ciprofloxacin since 10/23/2023 for a Cipro  susceptible E. coli UTI so this may be confounding our current UA and culture data -check renal ultrasound to rule out renal abscess - I am not presently suspicious of a source that would suggest MRSA therefore I will discontinue vancomycin for now but monitor very closely  HTN Continue usual home medication  Chronic atrial fibrillation Continue usual metoprolol , digoxin , Eliquis   CAD Quiescent at present  BPH Continue finasteride  and tamsulosin   COPD Quiescent at present -continue usual inhaled medications   Family Communication: Spoke with wife at bedside at length Disposition: Anticipate eventual discharge home   Objective: Blood pressure (!) 154/82, pulse 80, temperature (!) 102.1 F (38.9 C), temperature source Rectal, resp. rate (!) 22, SpO2 95%.  Intake/Output Summary (Last 24 hours) at 10/28/2023 0755 Last data filed at 10/28/2023 0616 Gross per 24 hour  Intake 502.09 ml  Output 700 ml  Net -197.91 ml   There were no vitals filed for this visit.  Examination: General: No acute respiratory distress Lungs: Clear to auscultation bilaterally without wheezes or crackles Cardiovascular: Regular rate and rhythm without murmur gallop or rub normal S1 and S2 Abdomen: Nontender, nondistended, soft, bowel sounds positive, no rebound, no ascites, no appreciable mass Extremities: No significant cyanosis, clubbing, or edema bilateral lower extremities  CBC: Recent Labs  Lab 10/27/23 0914 10/27/23 0922 10/28/23 0259  WBC 13.5*  --  11.8*  NEUTROABS 11.6*  --   --   HGB 15.8 16.0 14.7  HCT 48.4 47.0 44.7  MCV 96.6  --  95.5  PLT 261  --  228   Basic Metabolic Panel: Recent Labs  Lab 10/27/23 0914 10/27/23 0922 10/28/23 0259  NA 138 136 133*  K 4.7 4.8 4.3  CL 102  --  101  CO2 23  --  22  GLUCOSE 111*  --  118*  BUN 15  --  11  CREATININE 1.10  --  1.03  CALCIUM 9.7  --  9.0   GFR: CrCl cannot be calculated (Unknown ideal weight.).   Scheduled Meds:   apixaban   5 mg Oral BID   digoxin   0.125 mg Oral Daily   donepezil   10 mg Oral QHS   finasteride   5 mg Oral QHS   memantine   10 mg Oral BID   metoprolol  succinate  25 mg Oral QHS   sodium chloride  flush  3 mL Intravenous Q12H   tamsulosin   0.4 mg Oral QPC breakfast   [START ON 10/29/2023] umeclidinium bromide   1 puff Inhalation Daily   Continuous Infusions:  ceFEPime (MAXIPIME) IV Stopped (10/28/23 4401)   metronidazole Stopped (10/27/23 2245)   vancomycin       LOS: 0 days   Abbe Abate, MD [facility] Office  [phone] Pager - Text Page per [place] Foley  If 7PM-7AM, please contact night-coverage per Amion 10/28/2023, 7:55 AM

## 2023-10-28 NOTE — Care Management Obs Status (Signed)
 MEDICARE OBSERVATION STATUS NOTIFICATION   Patient Details  Name: Erik [name] MRN: [id number] Date of Birth: Jan 23, 1941   Medicare Observation Status Notification Given:  Yes    Ronni Colace, RN 10/28/2023, 10:27 AM

## 2023-10-28 NOTE — Plan of Care (Signed)
  Problem: Fluid Volume: Goal: Hemodynamic stability will improve 10/28/2023 2038 by Amber Bail, RN Outcome: Progressing 10/28/2023 2038 by Amber Bail, RN Outcome: Progressing   Problem: Clinical Measurements: Goal: Diagnostic test results will improve 10/28/2023 2038 by Amber Bail, RN Outcome: Progressing 10/28/2023 2038 by Amber Bail, RN Outcome: Progressing Goal: Signs and symptoms of infection will decrease 10/28/2023 2038 by Amber Bail, RN Outcome: Progressing 10/28/2023 2038 by Amber Bail, RN Outcome: Progressing   Problem: Respiratory: Goal: Ability to maintain adequate ventilation will improve 10/28/2023 2038 by Amber Bail, RN Outcome: Progressing 10/28/2023 2038 by Amber Bail, RN Outcome: Progressing   Problem: Education: Goal: Knowledge of General Education information will improve Description: Including pain rating scale, medication(s)/side effects and non-pharmacologic comfort measures Outcome: Progressing   Problem: Health Behavior/Discharge Planning: Goal: Ability to manage health-related needs will improve Outcome: Progressing   Problem: Clinical Measurements: Goal: Ability to maintain clinical measurements within normal limits will improve Outcome: Progressing Goal: Will remain free from infection Outcome: Progressing Goal: Diagnostic test results will improve Outcome: Progressing Goal: Respiratory complications will improve Outcome: Progressing Goal: Cardiovascular complication will be avoided Outcome: Progressing   Problem: Activity: Goal: Risk for activity intolerance will decrease Outcome: Progressing   Problem: Nutrition: Goal: Adequate nutrition will be maintained Outcome: Progressing   Problem: Coping: Goal: Level of anxiety will decrease Outcome: Progressing   Problem: Elimination: Goal: Will not experience complications related to bowel motility Outcome: Progressing Goal: Will not experience complications related  to urinary retention Outcome: Progressing   Problem: Pain Managment: Goal: General experience of comfort will improve and/or be controlled Outcome: Progressing   Problem: Safety: Goal: Ability to remain free from injury will improve Outcome: Progressing   Problem: Skin Integrity: Goal: Risk for impaired skin integrity will decrease Outcome: Progressing

## 2023-10-29 DIAGNOSIS — R6510 Systemic inflammatory response syndrome (SIRS) of non-infectious origin without acute organ dysfunction: Secondary | ICD-10-CM | POA: Diagnosis not present

## 2023-10-29 LAB — URINE CULTURE: Culture: NO GROWTH

## 2023-10-29 LAB — CBC
HCT: 42.8 % (ref 39.0–52.0)
Hemoglobin: 14 g/dL (ref 13.0–17.0)
MCH: 31.3 pg (ref 26.0–34.0)
MCHC: 32.7 g/dL (ref 30.0–36.0)
MCV: 95.7 fL (ref 80.0–100.0)
Platelets: 214 10*3/uL (ref 150–400)
RBC: 4.47 MIL/uL (ref 4.22–5.81)
RDW: 13 % (ref 11.5–15.5)
WBC: 9.7 10*3/uL (ref 4.0–10.5)
nRBC: 0 % (ref 0.0–0.2)

## 2023-10-29 LAB — COMPREHENSIVE METABOLIC PANEL WITH GFR
ALT: 15 U/L (ref 0–44)
AST: 15 U/L (ref 15–41)
Albumin: 2.5 g/dL — ABNORMAL LOW (ref 3.5–5.0)
Alkaline Phosphatase: 57 U/L (ref 38–126)
Anion gap: 8 (ref 5–15)
BUN: 13 mg/dL (ref 8–23)
CO2: 25 mmol/L (ref 22–32)
Calcium: 9 mg/dL (ref 8.9–10.3)
Chloride: 105 mmol/L (ref 98–111)
Creatinine, Ser: 1.01 mg/dL (ref 0.61–1.24)
GFR, Estimated: >60 mL/min (ref >60)
Glucose, Bld: 94 mg/dL (ref 70–99)
Potassium: 3.9 mmol/L (ref 3.5–5.1)
Sodium: 138 mmol/L (ref 135–145)
Total Bilirubin: 1.1 mg/dL (ref 0.0–1.2)
Total Protein: 5.9 g/dL — ABNORMAL LOW (ref 6.5–8.1)

## 2023-10-29 LAB — RPR: RPR Ser Ql: NONREACTIVE

## 2023-10-29 LAB — MAGNESIUM: Magnesium: 1.8 mg/dL (ref 1.7–2.4)

## 2023-10-29 LAB — FOLATE: Folate: 33.4 ng/mL (ref >5.9)

## 2023-10-29 LAB — AMMONIA: Ammonia: 41 umol/L — ABNORMAL HIGH (ref 9–35)

## 2023-10-29 MED ORDER — LIDOCAINE HCL URETHRAL/MUCOSAL 2 % EX GEL
1.0000 | Freq: Four times a day (QID) | CUTANEOUS | Status: DC | PRN
  Administered 2023-10-30: 1 via TOPICAL
  Filled 2023-10-29 (×2): qty 6

## 2023-10-29 MED ORDER — CARMEX CLASSIC LIP BALM EX OINT
TOPICAL_OINTMENT | CUTANEOUS | Status: DC | PRN
  Filled 2023-10-29: qty 10

## 2023-10-29 NOTE — Evaluation (Signed)
 Occupational Therapy Evaluation Patient Details Name: Erik [name] MRN: [id number] DOB: October 26, 1940 Today's Date: 10/29/2023   History of Present Illness   Pt is an 82 y/o male presenting with AMS and fever of unknown origin. UA negative for UTI. Chest XR and head CT negative. PMH: dementia, HTN, CAD, a fib, BPH, COPD, TIA, bradycardia, alcohol use, recurrent UTIs, chronic back pain     Clinical Impressions PTA, pt lives with spouse, typically ambulatory with rollator and receives assist for bathing/dressing from PCA and spouse. Pt presents now with deficits in cognition, balance and endurance. Pt requires Mod A x 1-2 for bed mobility, Mod A x 2 to stand and take unsteady sidesteps at bedside. Pt requiring up to Max A for ADLs (+2 assist if completed in standing). Pt's wife at bedside, supportive and hopeful for mobility improvements during admission to return home with HHOT at DC.     If plan is discharge home, recommend the following:   A lot of help with walking and/or transfers;A lot of help with bathing/dressing/bathroom     Functional Status Assessment   Patient has had a recent decline in their functional status and demonstrates the ability to make significant improvements in function in a reasonable and predictable amount of time.     Equipment Recommendations   None recommended by OT     Recommendations for Other Services         Precautions/Restrictions   Precautions Precautions: Fall Restrictions Weight Bearing Restrictions Per Provider Order: No     Mobility Bed Mobility Overal bed mobility: Needs Assistance Bed Mobility: Supine to Sit, Sit to Supine     Supine to sit: Mod assist, +2 for physical assistance Sit to supine: Mod assist   General bed mobility comments: Mod A x 2 with use of bed pad to scoot hips to EOB, cues to use handheld assist to lift trunk. Mod A to return to bed w/ assist for BLE and repositioning trunk to midline     Transfers Overall transfer level: Needs assistance Equipment used: Rolling walker (2 wheels) Transfers: Sit to/from Stand Sit to Stand: Mod assist, +2 physical assistance, +2 safety/equipment           General transfer comment: Mod A x 2 to stand from bedside, initial posterior bias requiring hands on assist to correct. Mod A x2 to step at bedside w/ cues for sequencing (tactile cues to advance LE) and assist for RW mgmt      Balance Overall balance assessment: Needs assistance Sitting-balance support: No upper extremity supported, Feet supported Sitting balance-Leahy Scale: Fair     Standing balance support: Bilateral upper extremity supported, During functional activity Standing balance-Leahy Scale: Poor                             ADL either performed or assessed with clinical judgement   ADL Overall ADL's : Needs assistance/impaired Eating/Feeding: Minimal assistance;Sitting;Moderate assistance   Grooming: Maximal assistance;Sitting;Wash/dry face;Minimal assistance Grooming Details (indicate cue type and reason): Max cues, hand over hand in attempts for pt to wash face/wipe eyes. Pt later able to demo use of tissue d/t runny nose Upper Body Bathing: Maximal assistance;Sitting   Lower Body Bathing: Maximal assistance;+2 for safety/equipment;Sit to/from stand   Upper Body Dressing : Maximal assistance;Sitting Upper Body Dressing Details (indicate cue type and reason): donning gown around back Lower Body Dressing: Maximal assistance;+2 for safety/equipment;Sit to/from stand;Sitting/lateral leans  Toileting- Clothing Manipulation and Hygiene: Maximal assistance;+2 for safety/equipment;Sitting/lateral lean;Sit to/from stand               Vision Ability to See in Adequate Light: 1 Impaired Patient Visual Report: No change from baseline Vision Assessment?: No apparent visual deficits     Perception         Praxis         Pertinent  Vitals/Pain Pain Assessment Pain Assessment: Faces Faces Pain Scale: Hurts little more Pain Location: chronic back pain Pain Descriptors / Indicators: Discomfort, Grimacing, Guarding Pain Intervention(s): Monitored during session, Limited activity within patient's tolerance, Repositioned     Extremity/Trunk Assessment Upper Extremity Assessment Upper Extremity Assessment: Right hand dominant;RUE deficits/detail;LUE deficits/detail RUE Deficits / Details: questionable shoulder ROM impairments. pt resisting OT assessment of shoulders, could not reach to top of head when cued LUE Deficits / Details: questionable shoulder ROM impairments. pt resisting OT assessment of shoulders, could not reach to top of head when cued   Lower Extremity Assessment Lower Extremity Assessment: Defer to PT evaluation   Cervical / Trunk Assessment Cervical / Trunk Assessment: Kyphotic   Communication Communication Communication: Impaired Factors Affecting Communication: Difficulty expressing self   Cognition Arousal: Alert Behavior During Therapy: Flat affect Cognition: History of cognitive impairments             OT - Cognition Comments: hx of dementia, inconsistent command following, nonsensical speech at times. pleasant                 Following commands: Impaired Following commands impaired: Follows one step commands inconsistently, Follows multi-step commands inconsistently     Cueing  General Comments   Cueing Techniques: Verbal cues;Gestural cues;Tactile cues;Visual cues  Wife at bedside   Exercises     Shoulder Instructions      Home Living Family/patient expects to be discharged to:: Private residence Living Arrangements: Spouse/significant other Available Help at Discharge: Family;Available 24 hours/day;Personal care attendant Type of Home: House Home Access: Stairs to enter [employer] of Steps: 5 Entrance Stairs-Rails: Right;Left;Can reach both Home  Layout: Two level;Able to live on main level with bedroom/bathroom     Bathroom Shower/Tub: Tub/shower unit   Bathroom Toilet: Handicapped height     Home Equipment: Shower seat;Cane - single point;Rollator (4 wheels);Rolling Walker (2 wheels);BSC/3in1;Toilet riser   Additional Comments: M-F PCA  3 hours/day through the [place]      Prior Functioning/Environment Prior Level of Function : Needs assist             Mobility Comments: With rollator occasionally needs assist to get around (brake management) ADLs Comments: Showering every other day with PCA. He is able to go to the bathroom by himself. Depends the last couple weeks because he was not able to tell when he has to go. Wife assists with showering/dressing on the weekend    OT Problem List: Decreased activity tolerance;Impaired balance (sitting and/or standing);Decreased range of motion;Decreased strength;Decreased cognition;Decreased safety awareness;Decreased knowledge of use of DME or AE   OT Treatment/Interventions: Self-care/ADL training;Therapeutic exercise;DME and/or AE instruction;Energy conservation;Therapeutic activities;Patient/family education;Balance training      OT Goals(Current goals can be found in the care plan section)   Acute Rehab OT Goals Patient Stated Goal: Wife hopeful for pt mobility to improve. opting for [facility] rather than SNF OT Goal Formulation: With patient/family Time For Goal Achievement: 11/12/23 Potential to Achieve Goals: Good ADL Goals Pt Will Perform Eating: with set-up;sitting Pt Will Perform Grooming: with set-up;sitting Pt Will  Transfer to Toilet: with min assist;ambulating Pt Will Perform Toileting - Clothing Manipulation and hygiene: with mod assist;sitting/lateral leans;sit to/from stand   OT Frequency:       Co-evaluation PT/OT/SLP Co-Evaluation/Treatment: Yes Reason for Co-Treatment: For patient/therapist safety;To address functional/ADL transfers PT goals addressed during  session: Mobility/safety with mobility;Proper use of DME;Balance OT goals addressed during session: ADL's and self-care;Proper use of Adaptive equipment and DME      AM-PAC OT "6 Clicks" Daily Activity     Outcome Measure Help from another person eating meals?: A Lot Help from another person taking care of personal grooming?: A Lot Help from another person toileting, which includes using toliet, bedpan, or urinal?: A Lot Help from another person bathing (including washing, rinsing, drying)?: A Lot Help from another person to put on and taking off regular upper body clothing?: A Lot Help from another person to put on and taking off regular lower body clothing?: A Lot 6 Click Score: 12   End of Session Equipment Utilized During Treatment: Gait belt;Rolling walker (2 wheels)  Activity Tolerance: Patient tolerated treatment well Patient left: in bed;with bed alarm set;with call bell/phone within reach;with family/visitor present  OT Visit Diagnosis: Unsteadiness on feet (R26.81);Other abnormalities of gait and mobility (R26.89);Muscle weakness (generalized) (M62.81);Other symptoms and signs involving cognitive function                Time: 1610-9604 OT Time Calculation (min): 31 min Charges:  OT General Charges $OT Visit: 1 Visit OT Evaluation $OT Eval Moderate Complexity: 1 Mod  Lawrence Pretty, OTR/L Acute Rehab Services Office: [phone]   Shireen Dory 10/29/2023, 11:40 AM

## 2023-10-29 NOTE — Progress Notes (Signed)
 Erik [name]  ZOX:[id number] DOB: [birth year] DOA: 10/27/2023 PCP: Benedetto Brady, MD    Brief Narrative:  83 year old with a history of dementia, HTN, HLD, TIA, atrial fibrillation, CAD, bradycardia, BPH, COPD, alcohol use, periodic limb movement disorder, urinary incontinence with recurrent UTIs, and chronic low back pain who was brought to the ER 10/27/2023 by family after they noticed he was significantly confused and appeared to have a fever.  In the ER his temperature was 102.6 with an elevated WBC at 13.5.  CT head was without acute finding.  CXR was with no focal infiltrate.  Ammonia level was 42.  UA was not convincing of UTI.  Goals of Care:   Code Status: Full Code   DVT prophylaxis:  apixaban  (ELIQUIS ) tablet 5 mg   Interim Hx: The patient was afebrile overnight.  Vital signs are stable with blood pressure modestly elevated at 153-162.  At the time my exam he looks significantly improved.  He is much more alert and interactive, though at times remains confused.  His wife is at the bedside and states that he has improved but is not yet back to his baseline.  He has been eating some today.  Assessment & Plan:  Acute encephalopathy of unclear etiology on background of dementia  Has been undergoing outpatient evaluation by Neurology for his dementia -suspect metabolic encephalopathy related to fever of unknown origin as the etiology of his acute worsening encephalopathy-monitor clinically - B12, TSH, folic acid, and ammonia not significantly abnormal in a fashion that would impact mental status -suspect his acute delirium is due to metabolic encephalopathy in setting of probable UTI  Fever of unknown origin - probable multifactorial UTI CXR unrevealing - CT head unrevealing - UA not convincing of UTI presently though a urine cx was + for pansensitive E coli on 10/23/23 - placed on broad-spectrum antibiotics at time of admission -blood cultures unrevealing thus far -urine culture requested  but not yet resulted - has known diverticulosis as noted on CT abdomen 09/22/2023 so diverticulitis is certainly in the differential, but abdom exam is completely benign - the patient has been on a course of ciprofloxacin since 10/23/2023 for a Cipro susceptible E. coli UTI so this may be confounding our current UA and culture data - renal ultrasound without any appreciable abnormality - query if the patient had a separate strain of bacteria present in his urine that did not show up on the culture that is resistant to Cipro -nonetheless he is responding very favorably to his current antibiotic regimen - discontinue Flagyl and monitor on cefepime alone  HTN Adjust medical therapy if elevated blood pressure persists  Chronic atrial fibrillation Continue usual metoprolol , digoxin , Eliquis   CAD Quiescent at present  BPH Continue finasteride  and tamsulosin   COPD Quiescent at present -continue usual inhaled medications   Family Communication: Spoke with wife at bedside Disposition: Anticipate eventual discharge home   Objective: Blood pressure (!) 162/90, pulse 71, temperature 98.2 F (36.8 C), temperature source Oral, resp. rate 19, height 5\' 11"  (1.803 m), weight 97.1 kg, SpO2 97%.  Intake/Output Summary (Last 24 hours) at 10/29/2023 0920 Last data filed at 10/29/2023 0510 Gross per 24 hour  Intake 1547.42 ml  Output 3510 ml  Net -1962.58 ml   Filed Weights   10/28/23 0943  Weight: 97.1 kg    Examination: General: No acute respiratory distress -more alert Lungs: Clear to auscultation bilaterally without wheezes or crackles Cardiovascular: Regular rate and rhythm without murmur  Abdomen: Nontender, nondistended,  soft, bowel sounds positive, no rebound Extremities: No significant edema bilateral lower extremities  CBC: Recent Labs  Lab 10/27/23 0914 10/27/23 0922 10/28/23 0259 10/29/23 0453  WBC 13.5*  --  11.8* 9.7  NEUTROABS 11.6*  --   --   --   HGB 15.8 16.0 14.7 14.0   HCT 48.4 47.0 44.7 42.8  MCV 96.6  --  95.5 95.7  PLT 261  --  228 214   Basic Metabolic Panel: Recent Labs  Lab 10/27/23 0914 10/27/23 0922 10/28/23 0259 10/29/23 0453  NA 138 136 133* 138  K 4.7 4.8 4.3 3.9  CL 102  --  101 105  CO2 23  --  22 25  GLUCOSE 111*  --  118* 94  BUN 15  --  11 13  CREATININE 1.10  --  1.03 1.01  CALCIUM 9.7  --  9.0 9.0  MG  --   --   --  1.8   GFR: Estimated Creatinine Clearance: 67 mL/min (by C-G formula based on SCr of 1.01 mg/dL).   Scheduled Meds:  apixaban   5 mg Oral BID   digoxin   0.125 mg Intravenous Daily   donepezil   10 mg Oral QHS   finasteride   5 mg Oral QHS   memantine   10 mg Oral BID   metoprolol  succinate  25 mg Oral QHS   sodium chloride  flush  3 mL Intravenous Q12H   tamsulosin   0.4 mg Oral QPC breakfast   umeclidinium bromide   1 puff Inhalation Daily   Continuous Infusions:  ceFEPime (MAXIPIME) IV 2 g (10/29/23 0631)   metronidazole 500 mg (10/29/23 0847)     LOS: 1 day   Abbe Abate, MD [facility] Office  [phone] Pager - Text Page per Tilford Foley  If 7PM-7AM, please contact night-coverage per Amion 10/29/2023, 9:20 AM

## 2023-10-29 NOTE — Evaluation (Signed)
 Physical Therapy Evaluation  Patient Details Name: Erik [name] MRN: [id number] DOB: 1941/02/21 Today's Date: 10/29/2023  History of Present Illness  Pt is an 83 y/o male presenting with AMS and fever of unknown origin. UA negative for UTI. Chest XR and head CT negative. PMH: dementia, HTN, CAD, a fib, BPH, COPD, TIA, bradycardia, alcohol use, recurrent UTIs, chronic back pain   Clinical Impression  Pt admitted with above diagnosis. Pt currently with functional limitations due to the deficits listed below (see PT Problem List). At the time of PT eval pt was able to perform transfers and side stepping at EOB with up to +2 mod assist and RW for support. Pt following minimal commands, and unable to report name. Pt did state DOB correctly, but asking wife what his name is when asked. Wife reports desire to take pt home at d/c. She is interested in HHPT follow up at d/c as well. Pt will benefit from acute skilled PT to increase their independence and safety with mobility to allow discharge.           If plan is discharge home, recommend the following: A lot of help with walking and/or transfers;A lot of help with bathing/dressing/bathroom;Assistance with cooking/housework;Assist for transportation;Supervision due to cognitive status;Help with stairs or ramp for entrance   Can travel by private vehicle        Equipment Recommendations None recommended by PT  Recommendations for Other Services       Functional Status Assessment Patient has had a recent decline in their functional status and demonstrates the ability to make significant improvements in function in a reasonable and predictable amount of time.     Precautions / Restrictions Precautions Precautions: Fall Restrictions Weight Bearing Restrictions Per Provider Order: No      Mobility  Bed Mobility Overal bed mobility: Needs Assistance Bed Mobility: Supine to Sit, Sit to Supine     Supine to sit: Mod assist, +2 for  physical assistance Sit to supine: Mod assist   General bed mobility comments: Mod A x 2 with use of bed pad to scoot hips to EOB, cues to use handheld assist to lift trunk. Mod A to return to bed w/ assist for BLE and repositioning trunk to midline    Transfers Overall transfer level: Needs assistance Equipment used: Rolling walker (2 wheels) Transfers: Sit to/from Stand Sit to Stand: Mod assist, +2 physical assistance, +2 safety/equipment           General transfer comment: Mod A x 2 to stand from bedside, initial posterior bias requiring hands on assist to correct.    Ambulation/Gait Ambulation/Gait assistance: Mod assist, +2 physical assistance           Pre-gait activities: Mod A x2 to step at bedside w/ cues for sequencing (tactile cues to advance LE) and assist for RW mgmt    Stairs            Wheelchair Mobility     Tilt Bed    Modified Rankin (Stroke Patients Only)       Balance Overall balance assessment: Needs assistance Sitting-balance support: No upper extremity supported, Feet supported Sitting balance-Leahy Scale: Fair     Standing balance support: Bilateral upper extremity supported, During functional activity Standing balance-Leahy Scale: Poor                               Pertinent Vitals/Pain Pain Assessment Pain Assessment: Faces Faces  Pain Scale: Hurts little more Pain Location: chronic back pain Pain Descriptors / Indicators: Discomfort, Grimacing, Guarding Pain Intervention(s): Limited activity within patient's tolerance, Monitored during session, Repositioned    Home Living Family/patient expects to be discharged to:: Private residence Living Arrangements: Spouse/significant other Available Help at Discharge: Family;Available 24 hours/day;Personal care attendant Type of Home: House Home Access: Stairs to enter Entrance Stairs-Rails: Right;Left;Can reach both Entrance Stairs-Number of Steps: 5   Home Layout:  Two level;Able to live on main level with bedroom/bathroom Home Equipment: Shower seat;Cane - single point;Rollator (4 wheels);Rolling Walker (2 wheels);BSC/3in1;Toilet riser Additional Comments: M-F PCA  3 hours/day through the [place]    Prior Function Prior Level of Function : Needs assist             Mobility Comments: With rollator occasionally needs assist to get around (brake management) ADLs Comments: Showering every other day with PCA. He is able to go to the bathroom by himself. Depends the last couple weeks because he was not able to tell when he has to go. Wife assists with showering/dressing on the weekend     Extremity/Trunk Assessment   Upper Extremity Assessment Upper Extremity Assessment: Right hand dominant RUE Deficits / Details: questionable shoulder ROM impairments. pt resisting OT assessment of shoulders, could not reach to top of head when cued LUE Deficits / Details: questionable shoulder ROM impairments. pt resisting OT assessment of shoulders, could not reach to top of head when cued    Lower Extremity Assessment Lower Extremity Assessment: Generalized weakness    Cervical / Trunk Assessment Cervical / Trunk Assessment: Kyphotic  Communication   Communication Communication: Impaired Factors Affecting Communication: Difficulty expressing self    Cognition Arousal: Alert Behavior During Therapy: Flat affect                             Following commands: Impaired Following commands impaired: Follows one step commands inconsistently, Follows multi-step commands inconsistently     Cueing Cueing Techniques: Verbal cues, Gestural cues, Tactile cues, Visual cues     General Comments General comments (skin integrity, edema, etc.): Wife at bedside    Exercises     Assessment/Plan    PT Assessment Patient needs continued PT services  PT Problem List Decreased strength;Decreased activity tolerance;Decreased balance;Decreased  mobility;Decreased knowledge of use of DME;Decreased safety awareness;Decreased knowledge of precautions;Pain       PT Treatment Interventions DME instruction;Gait training;Stair training;Functional mobility training;Therapeutic activities;Therapeutic exercise;Balance training;Patient/family education    PT Goals (Current goals can be found in the Care Plan section)  Acute Rehab PT Goals Patient Stated Goal: Pt did not state goals. Wife wants to bring pt home at d/c PT Goal Formulation: With family Time For Goal Achievement: 11/12/23 Potential to Achieve Goals: Good    Frequency Min 2X/week     Co-evaluation PT/OT/SLP Co-Evaluation/Treatment: Yes Reason for Co-Treatment: For patient/therapist safety;To address functional/ADL transfers;Necessary to address cognition/behavior during functional activity PT goals addressed during session: Mobility/safety with mobility;Proper use of DME;Balance OT goals addressed during session: ADL's and self-care;Proper use of Adaptive equipment and DME       AM-PAC PT "6 Clicks" Mobility  Outcome Measure Help needed turning from your back to your side while in a flat bed without using bedrails?: A Lot Help needed moving from lying on your back to sitting on the side of a flat bed without using bedrails?: Total Help needed moving to and from a bed to a chair (  including a wheelchair)?: Total Help needed standing up from a chair using your arms (e.g., wheelchair or bedside chair)?: Total Help needed to walk in hospital room?: Total Help needed climbing 3-5 steps with a railing? : Total 6 Click Score: 7    End of Session Equipment Utilized During Treatment: Gait belt Activity Tolerance: Patient limited by fatigue Patient left: in bed;with call bell/phone within reach;with bed alarm set;with family/visitor present Nurse Communication: Mobility status PT Visit Diagnosis: Unsteadiness on feet (R26.81);Difficulty in walking, not elsewhere classified  (R26.2)    Time: 3086-5784 PT Time Calculation (min) (ACUTE ONLY): 31 min   Charges:   PT Evaluation $PT Eval Moderate Complexity: 1 Mod   PT General Charges $$ ACUTE PT VISIT: 1 Visit         Simone Dubois, PT, DPT Acute Rehabilitation Services Secure Chat Preferred Office: [phone]   Venus Ginsberg 10/29/2023, 2:58 PM

## 2023-10-29 NOTE — TOC CM/SW Note (Signed)
 Transition of Care [facility]) - Inpatient Brief Assessment   Patient Details  Name: Erik [name] MRN: [id number] Date of Birth: 1940/08/01  [facility] CM/SW Contact:    Juliane Och, LCSW Phone Number: 10/29/2023, 3:39 PM   Clinical Narrative:  3:39 PM Per chart review, patient resides at home with significant other. Patient has a PCP and insurance. Patient does not have SNF/HH/DME history. Patient's preferred pharmacy is [facility] [address] [place]. PT/OT recommendation patient to discharge with home health. Patient is not fully oriented and unable to answer SDOH questions. TOC will continue to follow.  Transition of Care Asessment: Insurance and Status: Insurance coverage has been reviewed Patient has primary care physician: Yes Home environment has been reviewed: Private Residence Prior level of function:: Needs Assistance Prior/Current Home Services: No current home services Social Drivers of Health Review:  (Patient unable to answer) Readmission risk has been reviewed: Yes Transition of care needs: transition of care needs identified, TOC will continue to follow

## 2023-10-30 DIAGNOSIS — R6510 Systemic inflammatory response syndrome (SIRS) of non-infectious origin without acute organ dysfunction: Secondary | ICD-10-CM | POA: Diagnosis not present

## 2023-10-30 MED ORDER — CEFUROXIME AXETIL 250 MG PO TABS
500.0000 mg | ORAL_TABLET | Freq: Two times a day (BID) | ORAL | Status: DC
  Administered 2023-10-30 – 2023-10-31 (×3): 500 mg via ORAL
  Filled 2023-10-30 (×4): qty 2

## 2023-10-30 MED ORDER — DIGOXIN 125 MCG PO TABS: 0.1250 mg | ORAL_TABLET | Freq: Every day | ORAL | Status: DC

## 2023-10-30 MED ORDER — LOPERAMIDE HCL 2 MG PO CAPS: 2.0000 mg | ORAL_CAPSULE | ORAL | Status: DC | PRN

## 2023-10-30 MED ORDER — DIPHENHYDRAMINE-ZINC ACETATE 2-0.1 % EX CREA
TOPICAL_CREAM | Freq: Three times a day (TID) | CUTANEOUS | Status: DC | PRN
  Filled 2023-10-30: qty 28

## 2023-10-30 MED ORDER — DIGOXIN 125 MCG PO TABS
0.1250 mg | ORAL_TABLET | Freq: Every day | ORAL | Status: DC
  Administered 2023-10-31: 0.125 mg via ORAL
  Filled 2023-10-30: qty 1

## 2023-10-30 NOTE — Progress Notes (Signed)
 Physical Therapy Treatment Patient Details Name: Erik [name] MRN: [id number] DOB: 05-Mar-1941 Today's Date: 10/30/2023   History of Present Illness Pt is an 83 y/o male presenting with AMS and fever of unknown origin. UA negative for UTI. Chest XR and head CT negative. PMH: dementia, HTN, CAD, a fib, BPH, COPD, TIA, bradycardia, alcohol use, recurrent UTIs, chronic back pain    PT Comments  Pt with noted delayed processing, visual hallucinations, muffled speech at times, and continues to require mod/maxA for EOB and OOB mobility. Aware wife desires to take patient home however unsure that wife would be able to care for patient at this level of care. Pt would need 24/7 assist that could physically provide moderate A to patient for ADLs and transfers. Acute PT to cont to follow.    If plan is discharge home, recommend the following: A lot of help with walking and/or transfers;A lot of help with bathing/dressing/bathroom;Assistance with cooking/housework;Assist for transportation;Supervision due to cognitive status;Help with stairs or ramp for entrance   Can travel by private vehicle        Equipment Recommendations  None recommended by PT    Recommendations for Other Services       Precautions / Restrictions Precautions Precautions: Fall Restrictions Weight Bearing Restrictions Per Provider Order: No     Mobility  Bed Mobility Overal bed mobility: Needs Assistance Bed Mobility: Rolling, Sidelying to Sit Rolling: Max assist, Used rails Sidelying to sit: Max assist, HOB elevated, Used rails       General bed mobility comments: max directional verbal cues, maxA for LE management off EOB and then for trunk elevation    Transfers Overall transfer level: Needs assistance Equipment used: Rolling walker (2 wheels) Transfers: Sit to/from Stand, Bed to chair/wheelchair/BSC Sit to Stand: Mod assist, +2 safety/equipment   Step pivot transfers: Mod assist, +2 safety/equipment        General transfer comment: Mod A x to stand from bedside, initial posterior bias requiring hands on assist to correct., PT to stabilize RW as pt repeatedly pulling up on walker despite verbal cues not to, bed elevated, step by step directional verbal cues to sequence stepping to chair, modA for walker management    Ambulation/Gait                   Stairs             Wheelchair Mobility     Tilt Bed    Modified Rankin (Stroke Patients Only)       Balance Overall balance assessment: Needs assistance Sitting-balance support: No upper extremity supported, Feet supported Sitting balance-Leahy Scale: Fair     Standing balance support: Bilateral upper extremity supported, During functional activity Standing balance-Leahy Scale: Poor Standing balance comment: reliant on RW, pt stood x 1 min to urinate, noted bilat knee instability                            Communication Communication Communication: Impaired Factors Affecting Communication: Difficulty expressing self  Cognition Arousal: Alert (but sleepy) Behavior During Therapy: Flat affect   PT - Cognitive impairments: Attention, Sequencing, Problem solving                       PT - Cognition Comments: pt with delayed response time, requires freq, repetitive multimodal directional verbal cues to complete tasks, pt with noted visual hallucinations and reaching for things that aren't there Following commands:  Impaired Following commands impaired: Follows one step commands inconsistently    Cueing Cueing Techniques: Verbal cues, Gestural cues, Tactile cues, Visual cues  Exercises      General Comments General comments (skin integrity, edema, etc.): VSS      Pertinent Vitals/Pain Pain Assessment Pain Assessment: No/denies pain Pain Location: chronic back pain but didn't complain this date    Home Living                          Prior Function            PT Goals  (current goals can now be found in the care plan section) Acute Rehab PT Goals PT Goal Formulation: With family Time For Goal Achievement: 11/12/23 Potential to Achieve Goals: Good Progress towards PT goals: Progressing toward goals    Frequency    Min 2X/week      PT Plan      Co-evaluation              AM-PAC PT "6 Clicks" Mobility   Outcome Measure  Help needed turning from your back to your side while in a flat bed without using bedrails?: A Lot Help needed moving from lying on your back to sitting on the side of a flat bed without using bedrails?: A Lot Help needed moving to and from a bed to a chair (including a wheelchair)?: A Lot Help needed standing up from a chair using your arms (e.g., wheelchair or bedside chair)?: A Lot Help needed to walk in hospital room?: Total Help needed climbing 3-5 steps with a railing? : Total 6 Click Score: 10    End of Session Equipment Utilized During Treatment: Gait belt Activity Tolerance: Patient limited by fatigue Patient left: with call bell/phone within reach;with family/visitor present;in chair;with chair alarm set Nurse Communication: Mobility status PT Visit Diagnosis: Unsteadiness on feet (R26.81);Difficulty in walking, not elsewhere classified (R26.2)     Time: 5284-1324 PT Time Calculation (min) (ACUTE ONLY): 28 min  Charges:    $Gait Training: 8-22 mins $Therapeutic Activity: 8-22 mins PT General Charges $$ ACUTE PT VISIT: 1 Visit                     Renaee Caro, PT, DPT Acute Rehabilitation Services Secure chat preferred Office #: [phone]    Jenna Moan 10/30/2023, 2:59 PM

## 2023-10-30 NOTE — TOC Progression Note (Incomplete)
 Transition of Care [facility]) - Progression Note    Patient Details  Name: Erik [name] MRN: [id number] Date of Birth: 12-09-1940  Transition of Care [facility]) CM/SW Contact  Tom-Johnson, Comfort Iversen Daphne, RN Phone Number: 10/30/2023, 2:04 PM  Clinical Narrative:     CM spoke with patient and wife, [name] at bedside about Home health recommendation.        Expected Discharge Plan and Services                                               Social Determinants of Health (SDOH) Interventions SDOH Screenings   Food Insecurity: No Food Insecurity (10/29/2023)  Housing: Unknown (10/29/2023)  Transportation Needs: Patient Unable To Answer (10/29/2023)  Utilities: Not At Risk (10/29/2023)  Social Connections: Patient Unable To Answer (10/29/2023)  Tobacco Use: Medium Risk (10/27/2023)    Readmission Risk Interventions     No data to display

## 2023-10-30 NOTE — Progress Notes (Signed)
 Erik [name]  HYQ:[id number] DOB: 1941/04/08 DOA: 10/27/2023 PCP: Benedetto Brady, MD    Brief Narrative:  83 year old with a history of dementia, HTN, HLD, TIA, atrial fibrillation, CAD, bradycardia, BPH, COPD, alcohol use, periodic limb movement disorder, urinary incontinence with recurrent UTIs, and chronic low back pain who was brought to the ER 10/27/2023 by family after they noticed he was significantly confused and appeared to have a fever.  In the ER his temperature was 102.6 with an elevated WBC at 13.5.  CT head was without acute finding.  CXR was with no focal infiltrate.  Ammonia level was 42.  UA was not convincing of UTI.  Goals of Care:   Code Status: Full Code   DVT prophylaxis:  apixaban  (ELIQUIS ) tablet 5 mg   Interim Hx: No acute events reported overnight.  He remains afebrile.  Vital signs are stable.  More alert today.  Approaching his baseline mental status.  Alert conversant and pleasant.  Assessment & Plan:  Acute encephalopathy of unclear etiology on background of dementia  Has been undergoing outpatient evaluation by Neurology for dementia -suspect metabolic encephalopathy related to fever/UTI as the etiology of his acute worsening encephalopathy - B12, TSH, folic acid, and ammonia not significantly abnormal in a fashion that would impact mental status - suspect his acute delirium is due to metabolic encephalopathy in setting of UTI -mental status has been slowly improving with patient almost back to his baseline at this time  FUO >  probable multibacterial UTI CXR unrevealing - CT head unrevealing - UA not convincing of UTI at presentation, though a urine cx was + for pansensitive E coli on 10/23/23 - placed on broad-spectrum antibiotics at time of admission - blood culture unrevealing thus far -urine culture this admission with no growth - has known diverticulosis as noted on CT abdomen 09/22/2023 so diverticulitis is certainly in the differential, but abdom exam is  completely benign - the patient has been on a course of ciprofloxacin since 10/23/2023 for a Cipro susceptible E. coli UTI so this may be confounding our current UA and culture data - renal ultrasound without any appreciable abnormality - query if the patient had a separate strain of bacteria present in his urine that did not show up on the culture that is resistant to Cipro - nonetheless he is responding very favorably to his current antibiotic regimen -has remained afebrile following discontinuation of Flagyl -transition to oral cephalosporin today and monitor clinically  HTN Blood pressure well-controlled at present  Chronic atrial fibrillation Continue usual metoprolol , digoxin , Eliquis  -heart rate controlled  CAD Quiescent at present  BPH Continue finasteride  and tamsulosin   COPD Quiescent at present -continue usual inhaled medications   Family Communication: Spoke with wife at bedside Disposition: Hopeful for discharge home 6/11 if has no fever with transition to oral antibiotic   Objective: Blood pressure 111/65, pulse 80, temperature 98.1 F (36.7 C), temperature source Oral, resp. rate 14, height 5\' 11"  (1.803 m), weight 97.1 kg, SpO2 99%.  Intake/Output Summary (Last 24 hours) at 10/30/2023 0846 Last data filed at 10/30/2023 0600 Gross per 24 hour  Intake 820 ml  Output 2825 ml  Net -2005 ml   Filed Weights   10/28/23 0943  Weight: 97.1 kg    Examination: General: No acute respiratory distress -more alert Lungs: Clear to auscultation bilaterally without wheezes or crackles Cardiovascular: Regular rate and rhythm without murmur  Abdomen: NT/ND, soft, BS positive, no rebound Extremities: No significant edema bilateral lower extremities  CBC: Recent Labs  Lab 10/27/23 0914 10/27/23 0922 10/28/23 0259 10/29/23 0453  WBC 13.5*  --  11.8* 9.7  NEUTROABS 11.6*  --   --   --   HGB 15.8 16.0 14.7 14.0  HCT 48.4 47.0 44.7 42.8  MCV 96.6  --  95.5 95.7  PLT 261  --   228 214   Basic Metabolic Panel: Recent Labs  Lab 10/27/23 0914 10/27/23 0922 10/28/23 0259 10/29/23 0453  NA 138 136 133* 138  K 4.7 4.8 4.3 3.9  CL 102  --  101 105  CO2 23  --  22 25  GLUCOSE 111*  --  118* 94  BUN 15  --  11 13  CREATININE 1.10  --  1.03 1.01  CALCIUM 9.7  --  9.0 9.0  MG  --   --   --  1.8   GFR: Estimated Creatinine Clearance: 67 mL/min (by C-G formula based on SCr of 1.01 mg/dL).   Scheduled Meds:  apixaban   5 mg Oral BID   digoxin   0.125 mg Intravenous Daily   donepezil   10 mg Oral QHS   finasteride   5 mg Oral QHS   memantine   10 mg Oral BID   metoprolol  succinate  25 mg Oral QHS   sodium chloride  flush  3 mL Intravenous Q12H   tamsulosin   0.4 mg Oral QPC breakfast   umeclidinium bromide   1 puff Inhalation Daily   Continuous Infusions:  ceFEPime (MAXIPIME) IV 2 g (10/30/23 0531)     LOS: 2 days   Abbe Abate, MD [facility] Office  [phone] Pager - Text Page per Tilford Foley  If 7PM-7AM, please contact night-coverage per Amion 10/30/2023, 8:46 AM

## 2023-10-31 ENCOUNTER — Ambulatory Visit: Admitting: Orthopaedic Surgery

## 2023-10-31 ENCOUNTER — Telehealth: Payer: Self-pay | Admitting: Internal Medicine

## 2023-10-31 DIAGNOSIS — R6510 Systemic inflammatory response syndrome (SIRS) of non-infectious origin without acute organ dysfunction: Secondary | ICD-10-CM | POA: Diagnosis not present

## 2023-10-31 MED ORDER — METOPROLOL SUCCINATE ER 25 MG PO TB24: 12.5000 mg | ORAL_TABLET | Freq: Every day | ORAL | Status: DC

## 2023-10-31 MED ORDER — ORAL CARE MOUTH RINSE: 15.0000 mL | OROMUCOSAL | Status: DC | PRN

## 2023-10-31 MED ORDER — AMLODIPINE BESYLATE 5 MG PO TABS: 5.0000 mg | ORAL_TABLET | Freq: Every day | ORAL | Status: AC | PRN

## 2023-10-31 MED ORDER — CEFUROXIME AXETIL 500 MG PO TABS: 500.0000 mg | ORAL_TABLET | Freq: Two times a day (BID) | ORAL | 0 refills | Status: AC

## 2023-10-31 NOTE — Progress Notes (Signed)
 OT Cancellation Note  Patient Details Name: Erik [name] MRN: [id number] DOB: [birth year]   Cancelled Treatment:    Reason Eval/Treat Not Completed: (P) Other (comment), Pt dressed and ready for DC, spoke with wife and Pt about safety at home, she feels that she will be able to support Pt at home, has some concerns about safety with transfers and consistency with Pt but continues to perform home over postacute rehab. They have all DME needed to maximize safety in home setting.   Scherry Curtis 10/31/2023, 2:00 PM

## 2023-10-31 NOTE — Telephone Encounter (Signed)
 Pt c/o medication issue:  1. Name of Medication:  metoprolol  succinate (TOPROL -XL) 25 MG 24 hr tablet  2. How are you currently taking this medication (dosage and times per day)?  1 tablet by mouth at night   3. Are you having a reaction (difficulty breathing--STAT)?   4. What is your medication issue?   Significant other says Dr. Lydia Sams suggests either cutting Metoprolol  in half or stopping it completely. Significant other would like to confirm whether Dr. Carolynne Citron agrees. Please advise.

## 2023-10-31 NOTE — Telephone Encounter (Signed)
 Called patient's daughter back about Renee's advisement. There is nothing available until a month from now. Patient is taking metoprolol  12.5 mg for now. Patient has seen [facility] in the past, will see if they can see him sooner.

## 2023-10-31 NOTE — Progress Notes (Signed)
 Physical Therapy Treatment Patient Details Name: Erik [name] MRN: [id number] DOB: 1940/11/27 Today's Date: 10/31/2023   History of Present Illness Pt is an 83 y/o male presenting with AMS and fever of unknown origin. UA negative for UTI. Chest XR and head CT negative. PMH: dementia, HTN, CAD, a fib, BPH, COPD, TIA, bradycardia, alcohol use, recurrent UTIs, chronic back pain    PT Comments  Patient progressing with hallway ambulation today.  Also able to get up easier without lifting help.  Discussed with wife and feel he can negotiate steps for home entry with rails and 2 A for safety.  Feel likely safe for home with family support and HHPT.  Will follow up if remains inpatient to practice steps next session.     If plan is discharge home, recommend the following: A lot of help with walking and/or transfers;A lot of help with bathing/dressing/bathroom;Assistance with cooking/housework;Assist for transportation;Supervision due to cognitive status;Help with stairs or ramp for entrance   Can travel by private vehicle        Equipment Recommendations  None recommended by PT    Recommendations for Other Services       Precautions / Restrictions Precautions Precautions: Fall     Mobility  Bed Mobility Overal bed mobility: Needs Assistance Bed Mobility: Rolling, Sidelying to Sit Rolling: Min assist, +2 for safety/equipment, Used rails Sidelying to sit: Mod assist, +2 for safety/equipment       General bed mobility comments: cues and A for technique with increased time, help for legs off EOB and some for lifting trunk though pt using rail and pulling up with HHA with PT A.    Transfers Overall transfer level: Needs assistance Equipment used: Rolling walker (2 wheels) Transfers: Sit to/from Stand Sit to Stand: From elevated surface, Min assist, +2 safety/equipment           General transfer comment: cues for hand placement though not able to maintain so walker stabilized;  A though pt able to rise without lifting help    Ambulation/Gait Ambulation/Gait assistance: +2 safety/equipment, Min assist Gait Distance (Feet): 90 Feet Assistive device: Rolling walker (2 wheels) Gait Pattern/deviations: Step-to pattern, Step-through pattern, Trunk flexed, Shuffle       General Gait Details: mild flexion at hips and pt used to rollator though still able to turn in wide hallway with standard RW   Stairs             Wheelchair Mobility     Tilt Bed    Modified Rankin (Stroke Patients Only)       Balance Overall balance assessment: Needs assistance Sitting-balance support: Feet supported Sitting balance-Leahy Scale: Fair     Standing balance support: Bilateral upper extremity supported Standing balance-Leahy Scale: Poor Standing balance comment: UE support for balance in standing and CGA for safety                            Communication Communication Communication: Impaired Factors Affecting Communication: Difficulty expressing self  Cognition Arousal: Alert Behavior During Therapy: Restless   PT - Cognitive impairments: Attention, Sequencing, Problem solving, Orientation   Orientation impairments: Place, Time, Situation                   PT - Cognition Comments: slow processing, step by step cues for sequencing, sustained attention, could not state his wife's name Following commands: Impaired Following commands impaired: Follows one step commands inconsistently, Follows one step commands  with increased time    Cueing Cueing Techniques: Verbal cues, Gestural cues, Tactile cues, Visual cues  Exercises General Exercises - Lower Extremity Ankle Circles/Pumps: AROM, Both, 5 reps, Supine Heel Slides: AROM, AAROM, Both, 5 reps, Supine    General Comments General comments (skin integrity, edema, etc.): Wife present in the room, reports some concern though wants to take him home.  States a friend who visited him yesterday  will help her get him into the home.  4-5 STE with rails; discussed help for walker up the steps and someone behind him for support as he holds rails.  Reports they have a RAV 4 so car transfers should not be too difficult.  Also discussed HHPT at d/c.      Pertinent Vitals/Pain Pain Assessment Pain Assessment: Faces Faces Pain Scale: Hurts little more Pain Location: generalized with mobility Pain Descriptors / Indicators: Discomfort, Grimacing, Guarding Pain Intervention(s): Monitored during session, Repositioned    Home Living                          Prior Function            PT Goals (current goals can now be found in the care plan section) Progress towards PT goals: Progressing toward goals    Frequency    Min 2X/week      PT Plan      Co-evaluation              AM-PAC PT 6 Clicks Mobility   Outcome Measure  Help needed turning from your back to your side while in a flat bed without using bedrails?: A Little Help needed moving from lying on your back to sitting on the side of a flat bed without using bedrails?: A Little Help needed moving to and from a bed to a chair (including a wheelchair)?: A Little Help needed standing up from a chair using your arms (e.g., wheelchair or bedside chair)?: A Little Help needed to walk in hospital room?: A Little Help needed climbing 3-5 steps with a railing? : Total 6 Click Score: 16    End of Session Equipment Utilized During Treatment: Gait belt Activity Tolerance: Patient tolerated treatment well Patient left: in chair;with call bell/phone within reach;with chair alarm set;with family/visitor present   PT Visit Diagnosis: Unsteadiness on feet (R26.81);Difficulty in walking, not elsewhere classified (R26.2)     Time: 1093-2355 PT Time Calculation (min) (ACUTE ONLY): 26 min  Charges:    $Gait Training: 8-22 mins $Therapeutic Activity: 8-22 mins PT General Charges $$ ACUTE PT VISIT: 1 Visit                      Abigail Hoff, PT Acute Rehabilitation Services Office:[phone] 10/31/2023    Erik [name] 10/31/2023, 11:52 AM

## 2023-10-31 NOTE — Telephone Encounter (Signed)
 Called patient's wife (DPR) about message. Patient is in the hospital right now and they are going to send him home either off metoprolol  or reduced dose and they wanted to make sure this is okay. Informed them that Dr. Carolynne Citron is not in the office, but we could send to his PA or NP. Patient's wife was agreeable to this.

## 2023-10-31 NOTE — Progress Notes (Signed)
 Patient discharged home with wife via private vehicle.  PIV removed.  All personal belongings sent with patient.  Discharge instructions reviewed with wife.  All questions addressed and answered.  Necia Bali RN

## 2023-10-31 NOTE — Care Management Important Message (Signed)
 Important Message  Patient Details  Name: Erik [name] MRN: [id number] Date of Birth: Nov 08, 1940   Important Message Given:  Yes - Medicare IM     Wynonia Hedges 10/31/2023, 11:40 AM

## 2023-10-31 NOTE — TOC Transition Note (Signed)
 Transition of Care [facility]) - Discharge Note   Patient Details  Name: Erik [name] MRN: [id number] Date of Birth: 1941/04/08  Transition of Care [facility]) CM/SW Contact:  Tom-Johnson, Hala Narula Daphne, RN Phone Number: 10/31/2023, 1:47 PM   Clinical Narrative:     Patient is scheduled for discharge today.  Readmission Risk Assessment done. Home health info, hospital f/u and discharge instructions on AVS. Wife, [name] at bedside and will transport at discharge.  No further TOC needs noted.       Final next level of care: Home w Home Health Services Barriers to Discharge: Barriers Resolved   Patient Goals and CMS Choice Patient states their goals for this hospitalization and ongoing recovery are:: To return home CMS Medicare.gov Compare Post Acute Care list provided to:: Patient Choice offered to / list presented to : Patient, Spouse      Discharge Placement                Patient to be transferred to facility by: Wife Name of family member notified: [name]    Discharge Plan and Services Additional resources added to the After Visit Summary for                  DME Arranged: N/A DME Agency: NA       HH Arranged: PT, RN, OT, Disease Management HH Agency: [facility] Date [facility] Agency Contacted: 10/30/23 Time HH Agency Contacted: 1310 Representative spoke with at [facility] Agency: Randel Buss  Social Drivers of Health (SDOH) Interventions SDOH Screenings   Food Insecurity: No Food Insecurity (10/29/2023)  Housing: Unknown (10/29/2023)  Transportation Needs: Patient Unable To Answer (10/29/2023)  Utilities: Not At Risk (10/29/2023)  Social Connections: Patient Unable To Answer (10/29/2023)  Tobacco Use: Medium Risk (10/27/2023)     Readmission Risk Interventions    10/31/2023    1:45 PM  Readmission Risk Prevention Plan  Post Dischage Appt Complete  Medication Screening Complete  Transportation Screening Complete

## 2023-11-01 LAB — CULTURE, BLOOD (ROUTINE X 2)
Culture: NO GROWTH
Culture: NO GROWTH
Special Requests: ADEQUATE
Special Requests: ADEQUATE

## 2023-11-01 NOTE — Discharge Summary (Signed)
 Physician Discharge Summary   Patient: Erik [name] MRN: [id number] DOB: [birth year]  Admit date:     10/27/2023  Discharge date: 10/31/2023  Discharge Physician: Charlean Congress  PCP: Benedetto Brady, MD  Recommendations at discharge: Follow-up with PCP in 1 week. Follow-up with cardiology in 1 month. Follow-up with urology in 2 weeks.   Follow-up Information     Benedetto Brady, MD. Schedule an appointment as soon as possible for a visit in 1 week(s).   Specialty: Family Medicine Contact information: [address] [place] [address] [phone]         Tammie Fall, MD. Schedule an appointment as soon as possible for a visit in 1 month(s).   Specialty: Cardiology Contact information: [address] [place] [address] [phone]         urology. Schedule an appointment as soon as possible for a visit in 2 week(s).          [facility] up.   Specialty: Home Health Services Why: Someone will call you to schedule first home visit. Contact information: [address] [place] [address] [phone]                Discharge Diagnoses: Principal Problem:   SIRS (systemic inflammatory response syndrome) (HCC) Active Problems:   Essential hypertension   Paroxysmal atrial fibrillation (HCC)   CAD (coronary artery disease)   History of TIA (transient ischemic attack)   Periodic limb movement disorder (PLMD)   ETOH abuse   Chronic obstructive pulmonary disease (HCC)   Enlarged prostate   Hyperlipidemia   Dementia (HCC)   Low back pain   Acute encephalopathy   FUO (fever of unknown origin)  Hospital Course: 83 year old with a history of dementia, HTN, HLD, TIA, atrial fibrillation, CAD, bradycardia, BPH, COPD, alcohol use, periodic limb movement disorder, urinary incontinence with recurrent UTIs, and chronic low back pain who was brought to the ER 10/27/2023 by family after they noticed he was  significantly confused and appeared to have a fever. In the ER his temperature was 102.6 with an elevated WBC at 13.5. CT head was without acute finding. CXR was with no focal infiltrate. Ammonia level was 42. UA was not convincing of UTI.   Assessment and plan. Acute encephalopathy of unclear etiology on background of dementia  Has been undergoing outpatient evaluation by Neurology for dementia.  Suspect metabolic encephalopathy related to fever/UTI as the etiology  Recent B12, TSH, folic acid, and ammonia not significantly abnormal Mental status has been slowly improving with patient close to his baseline per wife at bedside. On exam patient still has some asterixis although no offensive medication identified as well. Was also recently started on ciprofloxacin which can occasionally cause CNS toxicity. CT head unremarkable on admission.  MRI brain with and without contrast in April 2025 was negative for any mass or hydrocephalus.   FUO probable multibacterial UTI CXR unrevealing CT head unrevealing UA not convincing of UTI at presentation, though a urine cx was + for pansensitive E coli on 10/23/23 placed on broad-spectrum antibiotics at time of admission blood culture unrevealing thus far urine culture this admission with no growth the patient has been on a course of ciprofloxacin since 10/23/2023 for a Cipro susceptible E. coli UTI so this may be confounding our current UA and culture data renal ultrasound without any appreciable abnormality query if the patient had a separate strain of bacteria present in his urine that did  not show up on the culture that is resistant to Cipro - nonetheless he is responding very favorably to his current antibiotic regimen Continue oral antibiotic for longer duration. Given presentation with recurrent UTI, recommend that the patient follows up with urology given findings of BPH.  BPH. On Flomax  and finasteride . Urology follow-up recommended given  recurrent UTI.  HTN Blood pressure well-controlled at present   Chronic atrial fibrillation Bradycardia. 2.7-second sinus pause at 9:30 AM. On metoprolol  and digoxin  at home. Patient had multiple pauses seen on telemetry with sinus bradycardia both at nighttime as well as daytime. Remains largely asymptomatic. Recommended to discontinue metoprolol  although wife is worried. Discussed with cardiology and after shared decision making patient was switched to metoprolol  12.5 mg. Continue digoxin . Dig level was recently checked and was below normal range. Continue Eliquis .   CAD Quiescent at present   COPD Quiescent at present -continue usual inhaled medications   Family Communication: Spoke with wife at bedside    Consultants:  None  Procedures performed:  None  DISCHARGE MEDICATION: Allergies as of 10/31/2023       Reactions   Albuterol  Sulfate [albuterol ] Other (See Comments)   rapid heartbeat; afib   Hyoscyamine Sulfate    Other reaction(s): urinary retention   Adhesive [tape] Rash, Other (See Comments)   New heart monitor/patch (adhesive) broke out the skin and left a large red patch (still visible after 3 weeks   Warfarin And Related Other (See Comments)   Patient's INR could never get regulated while on this        Medication List     STOP taking these medications    Cipro 500 MG tablet Generic drug: ciprofloxacin       TAKE these medications    acetaminophen  500 MG tablet Commonly known as: TYLENOL  Take 500-1,000 mg by mouth every 6 (six) hours as needed (pain).   amLODipine  5 MG tablet Commonly known as: NORVASC  Take 1 tablet (5 mg total) by mouth daily as needed (for systolic blood pressure more than 160). What changed:  when to take this reasons to take this   cefUROXime 500 MG tablet Commonly known as: CEFTIN Take 1 tablet (500 mg total) by mouth 2 (two) times daily with a meal for 10 days.   digoxin  0.125 MG tablet Commonly known as:  LANOXIN  TAKE 1 TABLET BY MOUTH DAILY. EXCEPT ON SUNDAY   donepezil  10 MG tablet Commonly known as: ARICEPT  Take 10 mg by mouth at bedtime.   Eliquis  5 MG Tabs tablet Generic drug: apixaban  TAKE 1 TABLET BY MOUTH TWICE A DAY What changed: how much to take   FIBER PO Take 1 capsule by mouth daily.   finasteride  5 MG tablet Commonly known as: PROSCAR  Take 5 mg by mouth at bedtime.   Incruse Ellipta  62.5 MCG/INH Aepb Generic drug: umeclidinium bromide  Take 1 puff by mouth daily.   ipratropium 0.03 % nasal spray Commonly known as: ATROVENT Place 2 sprays into the nose 3 (three) times daily.   memantine  10 MG tablet Commonly known as: NAMENDA  Take one tablet twice a day   metoprolol  succinate 25 MG 24 hr tablet Commonly known as: TOPROL -XL Take 0.5 tablets (12.5 mg total) by mouth daily. What changed: See the new instructions.   nitroGLYCERIN  0.4 MG SL tablet Commonly known as: Nitrostat  Place 1 tablet (0.4 mg total) under the tongue every 5 (five) minutes as needed for chest pain (MAX 3 TABLETS).   polyethylene glycol 17 g packet Commonly known  as: MIRALAX  / GLYCOLAX  Take 17 g by mouth daily as needed for mild constipation.   PRESERVISION AREDS 2+MULTI VIT PO Take 1 capsule by mouth in the morning and at bedtime.   tamsulosin  0.4 MG Caps capsule Commonly known as: FLOMAX  Take 1 capsule (0.4 mg total) by mouth daily after breakfast.   Theratears 0.25 % Soln Generic drug: Carboxymethylcellulose Sodium Place 1 drop into both eyes See admin instructions. 2-4 times daily   Vitamin D3 50 MCG (2000 UT) Tabs Take by mouth.       Disposition: Home Diet recommendation: Cardiac diet  Discharge Exam: Vitals:   10/31/23 0507 10/31/23 0551 10/31/23 0918 10/31/23 1333  BP: (!) 181/112 (!) 138/98 (!) 125/90 (!) 157/84  Pulse: 75  76 84  Resp: 18  19 19   Temp: 98 F (36.7 C)  97.9 F (36.6 C) 98.4 F (36.9 C)  TempSrc:    Oral  SpO2: 100%  97% 96%  Weight:       Height:       General: Appear in mild distress; no visible Abnormal Neck Mass Or lumps, Conjunctiva normal Cardiovascular: S1 and S2 Present, no Murmur, Respiratory: good respiratory effort, Bilateral Air entry present and CTA, no Crackles, no wheezes Abdomen: Bowel Sound present, Non tender  Extremities: no Pedal edema Neurology: alert and oriented to place and person mild asterixis present. Filed Weights   10/28/23 0943  Weight: 97.1 kg   Condition at discharge: stable  The results of significant diagnostics from this hospitalization (including imaging, microbiology, ancillary and laboratory) are listed below for reference.   Imaging Studies: US  RENAL Result Date: 10/28/2023 CLINICAL DATA:  Systemic inflammatory response syndrome EXAM: RENAL / URINARY TRACT ULTRASOUND COMPLETE COMPARISON:  CT abdomen pelvis 09/22/2023 FINDINGS: Right Kidney: Renal measurements: 10.7 x 5.9 x 5.6 cm = volume: 185 mL. Echogenicity within normal limits. No mass or hydronephrosis visualized. Left Kidney: Renal measurements: 10.5 x 5.5 x 5.6 cm = volume: 169 mL. Echogenicity within normal limits. No mass or hydronephrosis visualized. Bladder: Appears normal for degree of bladder distention. Other: Enlarged prostate. IMPRESSION: 1. Normal sonographic appearance of the kidneys.  No hydronephrosis. 2. Enlarged prostate. Electronically Signed   By: Rozell Cornet M.D.   On: 10/28/2023 19:22   CT Head Wo Contrast Result Date: 10/27/2023 CLINICAL DATA:  Mental status change, unknown cause EXAM: CT HEAD WITHOUT CONTRAST TECHNIQUE: Contiguous axial images were obtained from the base of the skull through the vertex without intravenous contrast. RADIATION DOSE REDUCTION: This exam was performed according to the departmental dose-optimization program which includes automated exposure control, adjustment of the mA and/or kV according to patient size and/or use of iterative reconstruction technique. COMPARISON:  CT head  09/22/2023 FINDINGS: Brain: No evidence of large-territorial acute infarction. No parenchymal hemorrhage. No mass lesion. No extra-axial collection. No mass effect or midline shift. No hydrocephalus. Basilar cisterns are patent. Vascular: No hyperdense vessel. Skull: No acute fracture or focal lesion. Sinuses/Orbits: Paranasal sinuses and mastoid air cells are clear. The orbits are unremarkable. Other: None. IMPRESSION: No acute intracranial abnormality. Electronically Signed   By: Morgane  Naveau M.D.   On: 10/27/2023 10:24   DG Chest Port 1 View Result Date: 10/27/2023 CLINICAL DATA:  Sepsis. EXAM: PORTABLE CHEST 1 VIEW COMPARISON:  Chest radiograph dated 05/03/2023 FINDINGS: With shallow inspiration. No focal consolidation, pleural effusion, pneumothorax the cardiac silhouette is within normal limits. No acute osseous pathology. IMPRESSION: No active disease. Electronically Signed   By: Marene Shape.D.  On: 10/27/2023 09:32    Microbiology: Results for orders placed or performed during the hospital encounter of 10/27/23  Blood Culture (routine x 2)     Status: None   Collection Time: 10/27/23  8:32 AM   Specimen: BLOOD  Result Value Ref Range Status   Specimen Description BLOOD SITE NOT SPECIFIED  Final   Special Requests   Final    BOTTLES DRAWN AEROBIC AND ANAEROBIC Blood Culture adequate volume   Culture   Final    NO GROWTH 5 DAYS Performed at [facility], [address] [place], [place] [address]    Report Status 11/01/2023 FINAL  Final  Blood Culture (routine x 2)     Status: None   Collection Time: 10/27/23  8:37 AM   Specimen: BLOOD LEFT ARM  Result Value Ref Range Status   Specimen Description BLOOD LEFT ARM  Final   Special Requests   Final    BOTTLES DRAWN AEROBIC AND ANAEROBIC Blood Culture adequate volume   Culture   Final    NO GROWTH 5 DAYS Performed at [facility], [address], [place], [place] [address]    Report Status 11/01/2023 FINAL   Final  Urine Culture (for pregnant, neutropenic or urologic patients or patients with an indwelling urinary catheter)     Status: None   Collection Time: 10/28/23  2:59 AM   Specimen: Urine, Clean Catch  Result Value Ref Range Status   Specimen Description URINE, CLEAN CATCH  Final   Special Requests NONE  Final   Culture   Final    NO GROWTH Performed at [facility], [address], [place], [place] [address]    Report Status 10/29/2023 FINAL  Final   Labs: CBC: Recent Labs  Lab 10/27/23 0914 10/27/23 0922 10/28/23 0259 10/29/23 0453  WBC 13.5*  --  11.8* 9.7  NEUTROABS 11.6*  --   --   --   HGB 15.8 16.0 14.7 14.0  HCT 48.4 47.0 44.7 42.8  MCV 96.6  --  95.5 95.7  PLT 261  --  228 214   Basic Metabolic Panel: Recent Labs  Lab 10/27/23 0914 10/27/23 0922 10/28/23 0259 10/29/23 0453  NA 138 136 133* 138  K 4.7 4.8 4.3 3.9  CL 102  --  101 105  CO2 23  --  22 25  GLUCOSE 111*  --  118* 94  BUN 15  --  11 13  CREATININE 1.10  --  1.03 1.01  CALCIUM 9.7  --  9.0 9.0  MG  --   --   --  1.8   Liver Function Tests: Recent Labs  Lab 10/27/23 0914 10/28/23 0259 10/29/23 0453  AST 20 18 15   ALT 22 18 15   ALKPHOS 63 54 57  BILITOT 1.5* 1.4* 1.1  PROT 7.6 6.5 5.9*  ALBUMIN 3.6 2.9* 2.5*   CBG: No results for input(s): GLUCAP in the last 168 hours.  Discharge time spent: greater than 30 minutes.  Author: Charlean Congress, MD  [facility] 10/31/2023

## 2023-11-09 ENCOUNTER — Encounter (HOSPITAL_COMMUNITY): Payer: Self-pay | Admitting: Physician Assistant

## 2023-11-09 ENCOUNTER — Ambulatory Visit (HOSPITAL_COMMUNITY)
Admission: RE | Admit: 2023-11-09 | Discharge: 2023-11-09 | Disposition: A | Discharge status: Home / Self Care | Source: Ambulatory Visit | Attending: Physician Assistant | Admitting: Physician Assistant

## 2023-11-09 VITALS — BP 150/80 | HR 51 | Ht 71.0 in | Wt 205.9 lb

## 2023-11-09 DIAGNOSIS — I4821 Permanent atrial fibrillation: Secondary | ICD-10-CM | POA: Diagnosis not present

## 2023-11-09 DIAGNOSIS — D6869 Other thrombophilia: Secondary | ICD-10-CM

## 2023-11-09 DIAGNOSIS — I480 Paroxysmal atrial fibrillation: Secondary | ICD-10-CM

## 2023-11-09 MED ORDER — METOPROLOL SUCCINATE ER 25 MG PO TB24: 25.0000 mg | ORAL_TABLET | Freq: Every day | ORAL | 3 refills | Status: AC

## 2023-11-09 NOTE — Addendum Note (Signed)
 Encounter addended by: Missouri Amor, RN on: 11/09/2023 1:57 PM  Actions taken: Order list changed

## 2023-11-09 NOTE — Progress Notes (Signed)
 Primary Care Physician: Benedetto Brady, MD Primary Electrophysiologist: Dr Carolynne Citron  Referring Physician: Dr Alethia Andrea is a 83 y.o. male with a history of atrial flutter, CAD, HLD, HTN, CVA, sinus node dysfunction, dementia, COPD, atrial fibrillation who presents for follow up in the [facility]. He is s/p atrial flutter ablation in 2010 and afib ablation in 2012. He has been maintained on flecainide  since. Patient is on Eliquis  for a CHADS2VASC score of 6. Patient reports that on 12/05/20 he started to feel much more fatigued and had dizziness on standing. There did not appear to be any specific triggers. He states he has not had any afib for years. He was in afib with heart rates ~100 bpm. He was scheduled for DCCV but converted back to SR on 12/07/20. He was seen by Dr Carolynne Citron 03/2023 in persistent afib, rate control only was recommended. He was hospitalized 10/27/23 with probable UTI and acute encephalopathy. He had short pauses noted on telemetry and his BB was decreased.   Patient returns for follow up for atrial fibrillation. Patient is a difficult historian and his wife helps to provide the history today. His heart rates were in the low 90's bpm on the lower dose of BB and patient didn't feel right. They increased his BB back to a full pill. He is rate controlled today. No bleeding issues on anticoagulation.   Today, he  denies symptoms of palpitations, chest pain, shortness of breath, orthopnea, PND, lower extremity edema, dizziness, presyncope, syncope, snoring, daytime somnolence, bleeding, or neurologic sequela. The patient is tolerating medications without difficulties and is otherwise without complaint today.    Atrial Fibrillation Risk Factors:  he does not have symptoms or diagnosis of sleep apnea. he does not have a history of rheumatic fever. he does not have a history of alcohol  use.   Atrial Fibrillation Management history:  Previous  antiarrhythmic drugs: flecainide  Previous cardioversions: 2013 Previous ablations: flutter 2010, afib 2012 Anticoagulation history: Eliquis    Past Medical History:  Diagnosis Date   Arthritis    both knees   Atrial fibrillation (HCC)    Paroxysmal, s/p PVI 11/11/10   Atrial flutter (HCC)    s/p RFCA  by GT   Basal cell carcinoma of ear    CAD (coronary artery disease)    a. cath 5/12: pLAD 25%, D1 25%, EF 65 % (done 2/2 abnormal myoview  with inferapical  ischemia)   Dyslipidemia    Dysrhythmia    HTN (hypertension)    Hx of echocardiogram    Echo (9/15):  Mild LVH, EF 55-60%, no RWMA, normal diast function, mild MR, mod LAE, mildly increased pulmonary pressures (PASP 30 mmHg).   Hypertension    Shortness of breath dyspnea    has a low heart rate   Snoring    a. sleep study 11/2012 neg for OSA; increased leg movement suspicious for primary movement d/o (ie restless leg syndrome)    Stroke (HCC)    mild stroke with  second heart ablation   TIA (transient ischemic attack)    11/12/10 post afib ablation    Current Outpatient Medications  Medication Sig Dispense Refill   acetaminophen  (TYLENOL ) 500 MG tablet Take 500-1,000 mg by mouth every 6 (six) hours as needed (pain).     amLODipine  (NORVASC ) 5 MG tablet Take 1 tablet (5 mg total) by mouth daily as needed (for systolic blood pressure more than 160).     Carboxymethylcellulose Sodium (THERATEARS)  0.25 % SOLN Place 1 drop into both eyes See admin instructions. 2-4 times daily     cefUROXime  (CEFTIN ) 500 MG tablet Take 1 tablet (500 mg total) by mouth 2 (two) times daily with a meal for 10 days. 20 tablet 0   Cholecalciferol (VITAMIN D3) 50 MCG (2000 UT) TABS Take by mouth.     digoxin  (LANOXIN ) 0.125 MG tablet TAKE 1 TABLET BY MOUTH DAILY. EXCEPT ON SUNDAY 90 tablet 2   donepezil  (ARICEPT ) 10 MG tablet Take 10 mg by mouth at bedtime.     ELIQUIS  5 MG TABS tablet TAKE 1 TABLET BY MOUTH TWICE A DAY 60 tablet 11   FIBER PO Take 1  capsule by mouth daily.     finasteride  (PROSCAR ) 5 MG tablet Take 5 mg by mouth at bedtime.   12   INCRUSE ELLIPTA  62.5 MCG/INH AEPB Take 1 puff by mouth daily.  1   ipratropium (ATROVENT) 0.03 % nasal spray Place 2 sprays into the nose 3 (three) times daily.     memantine  (NAMENDA ) 10 MG tablet Take one tablet twice a day 180 tablet 3   metoprolol  succinate (TOPROL -XL) 25 MG 24 hr tablet Take 0.5 tablets (12.5 mg total) by mouth daily.     Multiple Vitamins-Minerals (PRESERVISION AREDS 2+MULTI VIT PO) Take 1 capsule by mouth in the morning and at bedtime.     nitroGLYCERIN  (NITROSTAT ) 0.4 MG SL tablet Place 1 tablet (0.4 mg total) under the tongue every 5 (five) minutes as needed for chest pain (MAX 3 TABLETS). 25 tablet 11   polyethylene glycol (MIRALAX  / GLYCOLAX ) 17 g packet Take 17 g by mouth daily as needed for mild constipation. 14 each 0   tamsulosin  (FLOMAX ) 0.4 MG CAPS capsule Take 1 capsule (0.4 mg total) by mouth daily after breakfast. 14 capsule 0   No current facility-administered medications for this encounter.    ROS- All systems are reviewed and negative except as per the HPI above.  Physical Exam: Vitals:   11/09/23 0941  BP: (!) 150/80  Pulse: (!) 51  Weight: 93.4 kg  Height: 5' 11 (1.803 m)     GEN: Well nourished, well developed in no acute distress CARDIAC: Irregularly irregular rate and rhythm, no murmurs, rubs, gallops RESPIRATORY:  Clear to auscultation without rales, wheezing or rhonchi  ABDOMEN: Soft, non-tender, non-distended EXTREMITIES:  No edema; No deformity    Wt Readings from Last 3 Encounters:  11/09/23 93.4 kg  10/28/23 97.1 kg  09/22/23 98 kg    EKG today demonstrates  Afib with slow V rate Vent. rate 51 BPM PR interval * ms QRS duration 80 ms QT/QTcB 404/372 ms   Echo 07/02/17 demonstrated  - Left ventricle: The cavity size was mildly dilated. Wall    thickness was increased in a pattern of mild LVH. Systolic    function was  normal. The estimated ejection fraction was in the    range of 55% to 60%. Features are consistent with a pseudonormal    left ventricular filling pattern, with concomitant abnormal    relaxation and increased filling pressure (grade 2 diastolic    dysfunction). Doppler parameters are consistent with high    ventricular filling pressure.  - Aortic valve: There was trivial regurgitation.  - Mitral valve: Calcified annulus. Mildly thickened leaflets .    There was mild regurgitation.  - Left atrium: The atrium was mildly dilated.  - Pulmonary arteries: PA peak pressure: 35 mm Hg (S).   Epic  records are reviewed at length today  CHA2DS2-VASc Score = 6  The patient's score is based upon: CHF History: 0 HTN History: 1 Diabetes History: 0 Stroke History: 2 Vascular Disease History: 1 Age Score: 2 Gender Score: 0       ASSESSMENT AND PLAN: Permanent Atrial Fibrillation (ICD10:  I48.11) The patient's CHA2DS2-VASc score is 6, indicating a 9.7% annual risk of stroke.   Patient in rate controlled afib. His wife reports his rates are typically in the 60s at home.  Previously failed flecainide  and propafenone  Continue digoxin  0.125 mg daily Continue Eliquis  5 mg BID Continue Toprol  25 mg daily  Secondary Hypercoagulable State (ICD10:  D68.69) The patient is at significant risk for stroke/thromboembolism based upon his CHA2DS2-VASc Score of 6.  Continue Apixaban  (Eliquis ).   HTN Mildly elevated today, better controlled at previous visits. No changes today.    Follow up with Dr Carolynne Citron in 3 months.    Myrtha Ates PA-C [facility] [address] [place], [place] [address] [phone] 11/09/2023 10:05 AM

## 2023-11-21 ENCOUNTER — Other Ambulatory Visit: Payer: Self-pay

## 2023-11-21 ENCOUNTER — Encounter (HOSPITAL_COMMUNITY): Payer: Self-pay

## 2023-11-21 ENCOUNTER — Emergency Department (HOSPITAL_COMMUNITY)

## 2023-11-21 ENCOUNTER — Emergency Department (HOSPITAL_COMMUNITY)
Admission: EM | Admit: 2023-11-21 | Discharge: 2023-11-21 | Disposition: A | Discharge status: Home / Self Care | Attending: Emergency Medicine | Admitting: Emergency Medicine

## 2023-11-21 ENCOUNTER — Ambulatory Visit: Admitting: Orthopaedic Surgery

## 2023-11-21 DIAGNOSIS — J449 Chronic obstructive pulmonary disease, unspecified: Secondary | ICD-10-CM | POA: Insufficient documentation

## 2023-11-21 DIAGNOSIS — F0390 Unspecified dementia without behavioral disturbance: Secondary | ICD-10-CM | POA: Diagnosis not present

## 2023-11-21 DIAGNOSIS — I10 Essential (primary) hypertension: Secondary | ICD-10-CM | POA: Insufficient documentation

## 2023-11-21 DIAGNOSIS — Z7951 Long term (current) use of inhaled steroids: Secondary | ICD-10-CM | POA: Diagnosis not present

## 2023-11-21 DIAGNOSIS — Z7901 Long term (current) use of anticoagulants: Secondary | ICD-10-CM | POA: Diagnosis not present

## 2023-11-21 DIAGNOSIS — R1013 Epigastric pain: Secondary | ICD-10-CM | POA: Insufficient documentation

## 2023-11-21 DIAGNOSIS — Z79899 Other long term (current) drug therapy: Secondary | ICD-10-CM | POA: Insufficient documentation

## 2023-11-21 DIAGNOSIS — I2510 Atherosclerotic heart disease of native coronary artery without angina pectoris: Secondary | ICD-10-CM | POA: Diagnosis not present

## 2023-11-21 DIAGNOSIS — R0789 Other chest pain: Secondary | ICD-10-CM | POA: Insufficient documentation

## 2023-11-21 LAB — TROPONIN I (HIGH SENSITIVITY)
Troponin I (High Sensitivity): 9 ng/L
Troponin I (High Sensitivity): 10 ng/L (ref <18)

## 2023-11-21 LAB — BASIC METABOLIC PANEL WITH GFR
Anion gap: 11 (ref 5–15)
BUN: 16 mg/dL (ref 8–23)
CO2: 24 mmol/L (ref 22–32)
Calcium: 9.9 mg/dL (ref 8.9–10.3)
Chloride: 103 mmol/L (ref 98–111)
Creatinine, Ser: 0.94 mg/dL (ref 0.61–1.24)
GFR, Estimated: >60 mL/min (ref >60)
Glucose, Bld: 147 mg/dL — ABNORMAL HIGH (ref 70–99)
Potassium: 4.3 mmol/L (ref 3.5–5.1)
Sodium: 138 mmol/L (ref 135–145)

## 2023-11-21 LAB — CBC
HCT: 48.6 % (ref 39.0–52.0)
Hemoglobin: 15.7 g/dL (ref 13.0–17.0)
MCH: 31.7 pg (ref 26.0–34.0)
MCHC: 32.3 g/dL (ref 30.0–36.0)
MCV: 98.2 fL (ref 80.0–100.0)
Platelets: 250 10*3/uL (ref 150–400)
RBC: 4.95 MIL/uL (ref 4.22–5.81)
RDW: 13.3 % (ref 11.5–15.5)
WBC: 8.8 10*3/uL (ref 4.0–10.5)
nRBC: 0 % (ref 0.0–0.2)

## 2023-11-21 NOTE — ED Provider Notes (Signed)
  EMERGENCY DEPARTMENT AT [facility] Provider Note   CSN: [id number] Arrival date & time: 11/21/23  1456     Patient presents with: No chief complaint on file.   Erik [name] is a 83 y.o. male.   83 year old male with prior medical history as detailed below presents for evaluation.  Patient reports onset of epigastric discomfort and pressure around 1 PM.  Patient took 1 nitroglycerin  at home.  Patient took 324 mg of aspirin  prior to arrival.  Patient reports that his symptoms have completely resolved.  He denies significant issues with prior chest discomfort or pain.  Patient with a history of atrial flutter, CAD, HLD, HTN, CVA, sinus node dysfunction, dementia, COPD, atrial fibrillation.  He is compliant with Eliquis  as previously prescribed.  The history is provided by the patient and medical records.       Prior to Admission medications   Medication Sig Start Date End Date Taking? Authorizing Provider  acetaminophen  (TYLENOL ) 500 MG tablet Take 500-1,000 mg by mouth every 6 (six) hours as needed (pain).    [provider]  amLODipine  (NORVASC ) 5 MG tablet Take 1 tablet (5 mg total) by mouth daily as needed (for systolic blood pressure more than 160). 10/31/23   Patel, Pranav M, MD  Carboxymethylcellulose Sodium (THERATEARS) 0.25 % SOLN Place 1 drop into both eyes See admin instructions. 2-4 times daily    [provider]  Cholecalciferol (VITAMIN D3) 50 MCG (2000 UT) TABS Take by mouth.    [provider]  digoxin  (LANOXIN ) 0.125 MG tablet TAKE 1 TABLET BY MOUTH DAILY. EXCEPT ON SUNDAY 07/12/23   Waddell Danelle ORN, MD  donepezil  (ARICEPT ) 10 MG tablet Take 10 mg by mouth at bedtime. 07/04/18   [provider]  ELIQUIS  5 MG TABS tablet TAKE 1 TABLET BY MOUTH TWICE A DAY 03/21/18   Waddell Danelle ORN, MD  FIBER PO Take 1 capsule by mouth daily.    [provider]  finasteride  (PROSCAR ) 5 MG tablet Take 5 mg by mouth at  bedtime.  08/08/17   [provider]  INCRUSE ELLIPTA  62.5 MCG/INH AEPB Take 1 puff by mouth daily. 01/29/17   [provider]  ipratropium (ATROVENT) 0.03 % nasal spray Place 2 sprays into the nose 3 (three) times daily. 08/06/23   [provider]  memantine  (NAMENDA ) 10 MG tablet Take one tablet twice a day 09/28/23   Wertman, Sara E, PA-C  metoprolol  succinate (TOPROL -XL) 25 MG 24 hr tablet Take 1 tablet (25 mg total) by mouth daily. 11/09/23   Fenton, Clint R, PA  Multiple Vitamins-Minerals (PRESERVISION AREDS 2+MULTI VIT PO) Take 1 capsule by mouth in the morning and at bedtime.    [provider]  nitroGLYCERIN  (NITROSTAT ) 0.4 MG SL tablet Place 1 tablet (0.4 mg total) under the tongue every 5 (five) minutes as needed for chest pain (MAX 3 TABLETS). 04/28/21   Waddell Danelle ORN, MD  polyethylene glycol (MIRALAX  / GLYCOLAX ) 17 g packet Take 17 g by mouth daily as needed for mild constipation. 06/15/20   Malvina Ellen, MD  tamsulosin  (FLOMAX ) 0.4 MG CAPS capsule Take 1 capsule (0.4 mg total) by mouth daily after breakfast. 06/15/20   Malvina Ellen, MD    Allergies: Albuterol  sulfate [albuterol ], Hyoscyamine sulfate, Adhesive [tape], and Warfarin and related    Review of Systems  All other systems reviewed and are negative.   Updated Vital Signs BP (!) 149/94 (BP Location: Right Arm)  Pulse 80   Temp 97.8 F (36.6 C)   Resp 18   SpO2 100%   Physical Exam Vitals and nursing note reviewed.  Constitutional:      General: He is not in acute distress.    Appearance: Normal appearance. He is well-developed.  HENT:     Head: Normocephalic and atraumatic.  Eyes:     Conjunctiva/sclera: Conjunctivae normal.     Pupils: Pupils are equal, round, and reactive to light.  Cardiovascular:     Rate and Rhythm: Normal rate and regular rhythm.     Heart sounds: Normal heart sounds.  Pulmonary:     Effort: Pulmonary effort is normal. No respiratory distress.      Breath sounds: Normal breath sounds.  Abdominal:     General: There is no distension.     Palpations: Abdomen is soft.     Tenderness: There is no abdominal tenderness.  Musculoskeletal:        General: No deformity. Normal range of motion.     Cervical back: Normal range of motion and neck supple.  Skin:    General: Skin is warm and dry.  Neurological:     General: No focal deficit present.     Mental Status: He is alert and oriented to person, place, and time.     (all labs ordered are listed, but only abnormal results are displayed) Labs Reviewed  BASIC METABOLIC PANEL WITH GFR - Abnormal; Notable for the following components:      Result Value   Glucose, Bld 147 (*)    All other components within normal limits  CBC  TROPONIN I (HIGH SENSITIVITY)  TROPONIN I (HIGH SENSITIVITY)    EKG: EKG Interpretation Date/Time:  Wednesday November 21 2023 15:10:45 EDT Ventricular Rate:  76 PR Interval:    QRS Duration:  76 QT Interval:  374 QTC Calculation: 420 R Axis:   -42  Text Interpretation: Atrial fibrillation Left axis deviation Abnormal ECG When compared with ECG of 09-Nov-2023 09:44, PREVIOUS ECG IS PRESENT Confirmed by Laurice Coy [phone]) on 11/21/2023 7:02:01 PM  Radiology: DG Chest 2 View Result Date: 11/21/2023 CLINICAL DATA:  cp EXAM: CHEST - 2 VIEW COMPARISON:  October 27, 2023 FINDINGS: The lung apices are partially obscured by the patient's chin. Markedly low lung volumes with bronchovascular crowding and streaky bibasilar atelectasis. No focal airspace consolidation, pleural effusion, or pneumothorax. No cardiomegaly.No acute fracture or destructive lesion. Multilevel degenerative disc disease of the spine. IMPRESSION: No acute cardiopulmonary abnormality. Electronically Signed   By: Rogelia Myers M.D.   On: 11/21/2023 15:45     Procedures   Medications Ordered in the ED - No data to display                                  Medical Decision Making Amount and/or  Complexity of Data Reviewed Labs: ordered. Radiology: ordered.    Medical Screen Complete  This patient presented to the ED with complaint of chest discomfort.  This complaint involves an extensive number of treatment options. The initial differential diagnosis includes, but is not limited to, ACS, metabolic abnormality, etc.  This presentation is: Acute, Self-Limited, Previously Undiagnosed, Uncertain Prognosis, Complicated, Systemic Symptoms, and Threat to Life/Bodily Function  Patient is presenting with atypical chest discomfort.  Patient with resolution of symptoms prior to my evaluation.  Obtained EKG without evidence of acute ischemia.  Troponins are without significant elevation or  delta.  Other screening labs obtained are also without significant abnormality.  Case discussed with Dr. Gail, on call cardiology, who feels that patient does not require admission.  Patient offered overnight observation.  Patient declines.  He prefers to go home.  Importance close follow-up stressed.  Strict return precautions given and understood.  Additional history obtained: External records from outside sources obtained and reviewed including prior ED visits and prior Inpatient records.   Problem List / ED Course:  Atypical chest pain   Disposition:  After consideration of the diagnostic results and the patients response to treatment, I feel that the patent would benefit from close outpatient follow-up.       Final diagnoses:  Atypical chest pain    ED Discharge Orders     None          Laurice Maude BROCKS, MD 11/21/23 2143

## 2023-11-21 NOTE — ED Triage Notes (Addendum)
 Pt bib ems from home; after lunch c/o epigastric pain; took Pepto, pain improving; 1 nitroglycerin  taken at home, 324 ASA given pta; 12 lead afib controlled rate, hx same; pain 1/10; memory deficits from old stroke; VSS 150/80, P 80, RR 16, 96% RA, cbg 133  Pt's family states pt's BP dropped to 90/44 after taking 1 nitroglycerin ; states pt endorsing chest heaviness 7/10 prior to taking nitroglycerin ; denies N/V, denies diaphoresis, denies sob

## 2023-11-21 NOTE — ED Provider Triage Note (Signed)
 Emergency Medicine Provider Triage Evaluation Note  Erik [name] , a 83 y.o. male  was evaluated in triage.  Pt complains of substernal chest heaviness.  This started after lunch.  He was not exerting himself.  Denies any shortness of breath.  Denies any nausea, vomiting, diaphoresis, abdominal pain, diarrhea.  Patient was speaking with his cardiologist who recommended coming into the emergency department for further evaluation.  Review of Systems  Positive:  Negative: See above   Physical Exam  BP (!) 152/93 (BP Location: Right Arm)   Pulse 95   Temp 97.8 F (36.6 C) (Oral)   Resp 20   SpO2 99%  Gen:   Awake, no distress   Resp:  Normal effort  MSK:   Moves extremities without difficulty  Other:    Medical Decision Making  Medically screening exam initiated at 3:08 PM.  Appropriate orders placed.  Erik [name] was informed that the remainder of the evaluation will be completed by another provider, this initial triage assessment does not replace that evaluation, and the importance of remaining in the ED until their evaluation is complete.     Theotis Peers [place], [place] 11/21/23 [phone]

## 2023-11-21 NOTE — Discharge Instructions (Addendum)
 Return for any problem.   Up with your cardiology team tomorrow.

## 2023-11-21 NOTE — Telephone Encounter (Signed)
 Pt was expecting a cb that she never rec'vd and no avail til the end of month for f/u. Requesting cb

## 2023-11-21 NOTE — Telephone Encounter (Signed)
 Called pt to f/u not receiving a call back.   Spoke with pt spouse ok per DPR.    Reports pt is having chest discomfort that began suddenly.  Reports pt is holding his chest. Spouse doesn't know if it is indigestion from fish sandwich pt recently ate.    When asked to rate chest pain pt says 7:10.  Spouse reports only has NTG that is past expiration date.  Advised ED visit spouse reports absolutely does not want to go to ED.  Advised pt can try NTG to see if CP resolves.  Spouse gave pt NTG pt then reports 2:10 CP but feels worse.  Spouse reports pt is not the best at expressing himself since stroke.  Advised spouse of need for ED visit.  Spouse expresses understanding.

## 2023-11-30 ENCOUNTER — Ambulatory Visit: Attending: Internal Medicine | Admitting: Internal Medicine

## 2023-11-30 ENCOUNTER — Encounter: Payer: Self-pay | Admitting: Internal Medicine

## 2023-11-30 VITALS — BP 136/82 | HR 54 | Ht 71.0 in | Wt 211.6 lb

## 2023-11-30 DIAGNOSIS — I4819 Other persistent atrial fibrillation: Secondary | ICD-10-CM

## 2023-11-30 NOTE — Progress Notes (Signed)
 HPI [name] returns today for followup of his atrial fib. He is a pleasant 83 yo man with a h/o HTN and atrial fib. He has been tried on multiple medications. His flecainide  became ineffective. He was placed on propafenone  and has felt better but review of HR and BP suggest that he has not maintained NSR with propafenone . He does not have much in the way of palpitations. No edema. He has been in the hospital with UTI's and non-cardiac chest pain. His UTI's were associated with altered mentation. He has non-cardiac chest pain which improved with acid suppression.  Allergies  Allergen Reactions   Albuterol  Sulfate [Albuterol ] Other (See Comments)    rapid heartbeat; afib   Hyoscyamine Sulfate     Other reaction(s): urinary retention   Adhesive [Tape] Rash and Other (See Comments)    New heart monitor/patch (adhesive) broke out the skin and left a large red patch (still visible after 3 weeks   Warfarin And Related Other (See Comments)    Patient's INR could never get regulated while on this     Current Outpatient Medications  Medication Sig Dispense Refill   acetaminophen  (TYLENOL ) 500 MG tablet Take 500-1,000 mg by mouth every 6 (six) hours as needed (pain).     amLODipine  (NORVASC ) 5 MG tablet Take 1 tablet (5 mg total) by mouth daily as needed (for systolic blood pressure more than 160).     Carboxymethylcellulose Sodium (THERATEARS) 0.25 % SOLN Place 1 drop into both eyes See admin instructions. 2-4 times daily     Cholecalciferol (VITAMIN D3) 50 MCG (2000 UT) TABS Take by mouth.     digoxin  (LANOXIN ) 0.125 MG tablet TAKE 1 TABLET BY MOUTH DAILY. EXCEPT ON SUNDAY 90 tablet 2   donepezil  (ARICEPT ) 10 MG tablet Take 10 mg by mouth at bedtime.     ELIQUIS  5 MG TABS tablet TAKE 1 TABLET BY MOUTH TWICE A DAY 60 tablet 11   FIBER PO Take 1 capsule by mouth daily.     finasteride  (PROSCAR ) 5 MG tablet Take 5 mg by mouth at bedtime.   12   INCRUSE ELLIPTA  62.5 MCG/INH AEPB Take 1  puff by mouth daily.  1   ipratropium (ATROVENT) 0.03 % nasal spray Place 2 sprays into the nose 3 (three) times daily.     memantine  (NAMENDA ) 10 MG tablet Take one tablet twice a day 180 tablet 3   metoprolol  succinate (TOPROL -XL) 25 MG 24 hr tablet Take 1 tablet (25 mg total) by mouth daily. 30 tablet 3   Multiple Vitamins-Minerals (PRESERVISION AREDS 2+MULTI VIT PO) Take 1 capsule by mouth in the morning and at bedtime.     nitrofurantoin, macrocrystal-monohydrate, (MACROBID) 100 MG capsule Take 100 mg by mouth daily.     nitroGLYCERIN  (NITROSTAT ) 0.4 MG SL tablet Place 1 tablet (0.4 mg total) under the tongue every 5 (five) minutes as needed for chest pain (MAX 3 TABLETS). 25 tablet 11   polyethylene glycol (MIRALAX  / GLYCOLAX ) 17 g packet Take 17 g by mouth daily as needed for mild constipation. 14 each 0   tamsulosin  (FLOMAX ) 0.4 MG CAPS capsule Take 1 capsule (0.4 mg total) by mouth daily after breakfast. 14 capsule 0   No current facility-administered medications for this visit.     Past Medical History:  Diagnosis Date   Arthritis    both knees   Atrial fibrillation (HCC)    Paroxysmal, s/p PVI 11/11/10   Atrial flutter (HCC)  s/p RFCA  by GT   Basal cell carcinoma of ear    CAD (coronary artery disease)    a. cath 5/12: pLAD 25%, D1 25%, EF 65 % (done 2/2 abnormal myoview  with inferapical  ischemia)   Dyslipidemia    Dysrhythmia    HTN (hypertension)    Hx of echocardiogram    Echo (9/15):  Mild LVH, EF 55-60%, no RWMA, normal diast function, mild MR, mod LAE, mildly increased pulmonary pressures (PASP 30 mmHg).   Hypertension    Shortness of breath dyspnea    has a low heart rate   Snoring    a. sleep study 11/2012 neg for OSA; increased leg movement suspicious for primary movement d/o (ie restless leg syndrome)    Stroke (HCC)    mild stroke with  second heart ablation   TIA (transient ischemic attack)    11/12/10 post afib ablation    ROS:   All systems  reviewed and negative except as noted in the HPI.   Past Surgical History:  Procedure Laterality Date   atrial flutter ablation     by GT   basal cell cancer resection     CARDIOVERSION  10/31/2011   Procedure: CARDIOVERSION;  Surgeon: Toribio JONELLE Fuel, MD;  Location: [facility] ENDOSCOPY;  Service: Cardiovascular;  Laterality: N/A;   CATARACT EXTRACTION, BILATERAL     COLONOSCOPY     EYE SURGERY     growth under lid- x2   HEMORRHOID SURGERY     KNEE ARTHROSCOPY Bilateral    bilateral   SHOULDER ARTHROSCOPY Right 08/04/2015   Procedure: ARTHROSCOPY RIGHT SHOULDER;  Surgeon: Dempsey Sensor, MD;  Location: [facility] OR;  Service: Orthopedics;  Laterality: Right;   TEE WITHOUT CARDIOVERSION  10/31/2011   Procedure: TRANSESOPHAGEAL ECHOCARDIOGRAM (TEE);  Surgeon: Toribio JONELLE Fuel, MD;  Location: [facility] ENDOSCOPY;  Service: Cardiovascular;  Laterality: N/A;     Family History  Problem Relation Age of Onset   Hypertension Mother    Stroke Mother    Heart disease Mother    Arthritis Mother    Heart attack Neg Hx      Social History   Socioeconomic History   Marital status: Divorced    Spouse name: Not on file   Number of children: 1   Years of education: 12   Highest education level: Not on file  Occupational History   Occupation: Archivist for [employer]    Employer: RETIRED    Comment: Retired  Tobacco Use   Smoking status: Former    Current packs/day: 0.00    Average packs/day: 1.5 packs/day for 20.0 years (30.0 ttl pk-yrs)    Types: Cigarettes    Start date: 05/23/1955    Quit date: 05/23/1975    Years since quitting: 48.5   Smokeless tobacco: Never   Tobacco comments:    Former smoker 11/09/23  Vaping Use   Vaping status: Never Used  Substance and Sexual Activity   Alcohol  use: No    Alcohol /week: 14.0 standard drinks of alcohol     Types: 14 Standard drinks or equivalent per week    Comment: has not had a drink in 9 months   Drug use: No   Sexual activity: Not on  file  Other Topics Concern   Not on file  Social History Narrative   Right handed   Drinks caffeine prn   Lives with wife   Two floor home   Social Drivers of Health   Financial Resource Strain: Not on  file  Food Insecurity: No Food Insecurity (10/29/2023)   Hunger Vital Sign    Worried About Running Out of Food in the Last Year: Never true    Ran Out of Food in the Last Year: Never true  Transportation Needs: Patient Unable To Answer (10/29/2023)   PRAPARE - Transportation    Lack of Transportation (Medical): Patient unable to answer    Lack of Transportation (Non-Medical): Patient unable to answer  Physical Activity: Not on file  Stress: Not on file  Social Connections: Patient Unable To Answer (10/29/2023)   Social Connection and Isolation Panel    Frequency of Communication with Friends and Family: Patient unable to answer    Frequency of Social Gatherings with Friends and Family: Patient unable to answer    Attends Religious Services: Patient unable to answer    Active Member of Clubs or Organizations: Patient unable to answer    Attends Banker Meetings: Patient unable to answer    Marital Status: Patient unable to answer  Intimate Partner Violence: Patient Unable To Answer (10/29/2023)   Humiliation, Afraid, Rape, and Kick questionnaire    Fear of Current or Ex-Partner: Patient unable to answer    Emotionally Abused: Patient unable to answer    Physically Abused: Patient unable to answer    Sexually Abused: Patient unable to answer     BP 136/82   Pulse (!) 54   Ht 5' 11 (1.803 m)   Wt 211 lb 9.6 oz (96 kg)   SpO2 98%   BMI 29.51 kg/m   Physical Exam:  Elderly appearing NAD HEENT: Unremarkable Neck:  No JVD, no thyromegally Lymphatics:  No adenopathy Back:  No CVA tenderness Lungs:  Clear with no wheezes HEART:  IRegular rate rhythm, no murmurs, no rubs, no clicks Abd:  soft, positive bowel sounds, no organomegally, no rebound, no guarding Ext:  2  plus pulses, no edema, no cyanosis, no clubbing Skin:  No rashes no nodules Neuro:  CN II through XII intact, motor grossly intact  Assess/Plan:  Persistent atrial fib - I discussed the treatment options and he would like to continue with rate control. We will continue toprol . His VR appears controlled.  HTN - his bp is well controlled today. We will follow. Coags - he will continue eliquis  with no bleeding. Dementia -this seems to have stabilized. Continue namenda .   Danelle Lehua Flores,MD

## 2023-11-30 NOTE — Patient Instructions (Addendum)
 Medication Instructions:  Your physician recommends that you continue on your current medications as directed. Please refer to the Current Medication list given to you today.  *If you need a refill on your cardiac medications before your next appointment, please call your pharmacy*  Lab Work: None ordered.  You may go to any [facility] Location for your lab work:  [facility] - [address] ([facility]) - [address] [phone] [address]  [place] - [address]   [place]  - [address]   [place] - [address] - [address] [employer]  - [address] ([facility]   If you have labs (blood work) drawn today and your tests are completely normal, you will receive your results only by: [employer] (if you have MyChart)  If you have any lab test that is abnormal or we need to change your treatment, we will call you or send a MyChart message to review the results.  Testing/Procedures: None ordered.  Follow-Up: At [facility], you and your health needs are our priority.  As part of our continuing mission to provide you with exceptional heart care, we have created designated Provider Care Teams.  These Care Teams include your primary Cardiologist (physician) and Advanced Practice Providers (APPs -  Physician Assistants and Nurse Practitioners) who all work together to provide you with the care you need, when you need it.  We recommend signing up for the patient portal called MyChart.  Sign up information is provided on this After Visit Summary.  MyChart is used to connect with patients for Virtual Visits (Telemedicine).  Patients are able to view lab/test results, encounter notes, upcoming appointments, etc.  Non-urgent messages can be sent to your provider as well.   To learn more about what you can do with MyChart, go to  [email or web address].    Your next appointment:   February 2026   The format for your next appointment:   In Person  Provider:   Donnice Primus, MD{or one of the following Advanced Practice Providers on your designated Care Team:   Charlies Arthur, [place] Ozell Jodie Passey, [place] Leotis Barrack, NP  Note: Remote monitoring is used to monitor your Pacemaker/ ICD from home. This monitoring reduces the number of office visits required to check your device to one time per year. It allows us  to keep an eye on the functioning of your device to ensure it is working properly.

## 2023-12-19 ENCOUNTER — Ambulatory Visit: Admitting: Physician Assistant

## 2023-12-19 ENCOUNTER — Encounter: Payer: Self-pay | Admitting: Physician Assistant

## 2023-12-19 VITALS — BP 143/84 | HR 60 | Ht 71.0 in

## 2023-12-19 DIAGNOSIS — R4789 Other speech disturbances: Secondary | ICD-10-CM

## 2023-12-19 DIAGNOSIS — R251 Tremor, unspecified: Secondary | ICD-10-CM | POA: Diagnosis not present

## 2023-12-19 MED ORDER — CARBIDOPA-LEVODOPA 25-100 MG PO TABS: ORAL_TABLET | ORAL | 2 refills | Status: DC

## 2023-12-19 NOTE — Patient Instructions (Addendum)
 It was a pleasure to see you today at our office.   Recommendations:   memantine  to 10 mg twice a day  Continue donepezil  10 mg daily. Side effects were discussed  Follow up in 3  months  Referral to ST  1. Start carbidopa  (Sinemet ) 25/100mg  tablets.  Week 1: Take 1/2  tablet morning, 1/2 tab noon, and 1/2  tablet in evening/bedtime.  Week 2: Take 1/2 tablet in morning, 1/2 tablet noon, and 1 tablet in evening/bedtime.  Week 3: take 1/2 tablet in am, then 1 tab noon and then 1 tab in the evening  Week 4  & thereafter: Take 1 tablet three times daily.   Take the medication at the same time everyday. The medication does not get absorbed into your body as well, if you take it with protein-containing foods (meat, dairy, beans). Try taking the medication about one hour before meals. If you experience nausea by taking it on an empty stomach, you may take it with carbohydrate-containing food,such as bread or crackers. Side effects to look out for, include dizziness, nausea, vivid dreams and hallucinations. If you experience any of these symptoms, please call us .    For psychiatric meds, mood meds: Please have your primary care physician manage these medications.  If you have any severe symptoms of a stroke, or other severe issues such as confusion,severe chills or fever, etc call 911 or go to the ER as you may need to be evaluated further    For assessment of decision of mental capacity and competency:  Call Dr. Rosaline Nine, geriatric psychiatrist at [phone]  Counseling regarding caregiver distress, including caregiver depression, anxiety and issues regarding community resources, adult day care programs, adult living facilities, or memory care questions:  please contact your  Primary Doctor's Social Worker   Whom to call: Memory  decline, memory medications: Call our office [phone]    [email or web address]   RECOMMENDATIONS  FOR ALL PATIENTS WITH MEMORY PROBLEMS: 1. Continue to exercise (Recommend 30 minutes of walking everyday, or 3 hours every week) 2. Increase social interactions - continue going to [place] and enjoy social gatherings with friends and family 3. Eat healthy, avoid fried foods and eat more fruits and vegetables 4. Maintain adequate blood pressure, blood sugar, and blood cholesterol level. Reducing the risk of stroke and cardiovascular disease also helps promoting better memory. 5. Avoid stressful situations. Live a simple life and avoid aggravations. Organize your time and prepare for the next day in anticipation. 6. Sleep well, avoid any interruptions of sleep and avoid any distractions in the bedroom that may interfere with adequate sleep quality 7. Avoid sugar, avoid sweets as there is a strong link between excessive sugar intake, diabetes, and cognitive impairment We discussed the Mediterranean diet, which has been shown to help patients reduce the risk of progressive memory disorders and reduces cardiovascular risk. This includes eating fish, eat fruits and green leafy vegetables, nuts like almonds and hazelnuts, walnuts, and also use olive oil. Avoid fast foods and fried foods as much as possible. Avoid sweets and sugar as sugar use has been linked to worsening of memory function.  There is always a concern of gradual progression of memory problems. If this is the case, then we may need to adjust level of care according to patient needs. Support, both to the patient and caregiver, should then be put into place.          DRIVING: Regarding driving, in patients with progressive memory problems, driving will  be impaired. We advise to have someone else do the driving if trouble finding directions or if minor accidents are reported. Independent driving assessment is available to determine safety of driving.   If you are interested in the driving assessment, you can contact the following:  [facility]  [employer] in [place] [phone]  [facility] [phone]  [facility] [phone]  [facility] [phone] or [phone]   FALL PRECAUTIONS: Be cautious when walking. Scan the area for obstacles that may increase the risk of trips and falls. When getting up in the mornings, sit up at the edge of the bed for a few minutes before getting out of bed. Consider elevating the bed at the head end to avoid drop of blood pressure when getting up. Walk always in a well-lit room (use night lights in the walls). Avoid area rugs or power cords from appliances in the middle of the walkways. Use a walker or a cane if necessary and consider physical therapy for balance exercise. Get your eyesight checked regularly.  FINANCIAL OVERSIGHT: Supervision, especially oversight when making financial decisions or transactions is also recommended.  HOME SAFETY: Consider the safety of the kitchen when operating appliances like stoves, microwave oven, and blender. Consider having supervision and share cooking responsibilities until no longer able to participate in those. Accidents with firearms and other hazards in the house should be identified and addressed as well.   ABILITY TO BE LEFT ALONE: If patient is unable to contact 911 operator, consider using LifeLine, or when the need is there, arrange for someone to stay with patients. Smoking is a fire hazard, consider supervision or cessation. Risk of wandering should be assessed by caregiver and if detected at any point, supervision and safe proof recommendations should be instituted.  MEDICATION SUPERVISION: Inability to self-administer medication needs to be constantly addressed. Implement a mechanism to ensure safe administration of the medications.      Mediterranean Diet A Mediterranean diet refers to food and lifestyle choices that are based on the traditions of countries located on the [employer].  This way of eating has been shown to help prevent certain conditions and improve outcomes for people who have chronic diseases, like kidney disease and heart disease. What are tips for following this plan? Lifestyle  Cook and eat meals together with your family, when possible. Drink enough fluid to keep your urine clear or pale yellow. Be physically active every day. This includes: Aerobic exercise like running or swimming. Leisure activities like gardening, walking, or housework. Get 7-8 hours of sleep each night. If recommended by your health care provider, drink red wine in moderation. This means 1 glass a day for nonpregnant women and 2 glasses a day for men. A glass of wine equals 5 oz (150 mL). Reading food labels  Check the serving size of packaged foods. For foods such as rice and pasta, the serving size refers to the amount of cooked product, not dry. Check the total fat in packaged foods. Avoid foods that have saturated fat or trans fats. Check the ingredients list for added sugars, such as corn syrup. Shopping  At the grocery store, buy most of your food from the areas near the walls of the store. This includes: Fresh fruits and vegetables (produce). Grains, beans, nuts, and seeds. Some of these may be available in unpackaged forms or large amounts (in bulk). Fresh seafood. Poultry and eggs. Low-fat dairy products. Buy whole ingredients instead of prepackaged foods. Buy fresh fruits and  vegetables in-season from local farmers markets. Buy frozen fruits and vegetables in resealable bags. If you do not have access to quality fresh seafood, buy precooked frozen shrimp or canned fish, such as tuna, salmon, or sardines. Buy small amounts of raw or cooked vegetables, salads, or olives from the deli or salad bar at your store. Stock your pantry so you always have certain foods on hand, such as olive oil, canned tuna, canned tomatoes, rice, pasta, and beans. Cooking  Cook foods with  extra-virgin olive oil instead of using butter or other vegetable oils. Have meat as a side dish, and have vegetables or grains as your main dish. This means having meat in small portions or adding small amounts of meat to foods like pasta or stew. Use beans or vegetables instead of meat in common dishes like chili or lasagna. Experiment with different cooking methods. Try roasting or broiling vegetables instead of steaming or sauteing them. Add frozen vegetables to soups, stews, pasta, or rice. Add nuts or seeds for added healthy fat at each meal. You can add these to yogurt, salads, or vegetable dishes. Marinate fish or vegetables using olive oil, lemon juice, garlic, and fresh herbs. Meal planning  Plan to eat 1 vegetarian meal one day each week. Try to work up to 2 vegetarian meals, if possible. Eat seafood 2 or more times a week. Have healthy snacks readily available, such as: Vegetable sticks with hummus. Greek yogurt. Fruit and nut trail mix. Eat balanced meals throughout the week. This includes: Fruit: 2-3 servings a day Vegetables: 4-5 servings a day Low-fat dairy: 2 servings a day Fish, poultry, or lean meat: 1 serving a day Beans and legumes: 2 or more servings a week Nuts and seeds: 1-2 servings a day Whole grains: 6-8 servings a day Extra-virgin olive oil: 3-4 servings a day Limit red meat and sweets to only a few servings a month What are my food choices? Mediterranean diet Recommended Grains: Whole-grain pasta. Brown rice. Bulgar wheat. Polenta. Couscous. Whole-wheat bread. [place] Made[place]. Vegetables: Artichokes. Beets. Broccoli. Cabbage. Carrots. Eggplant. Green beans. Chard. Kale. Spinach. Onions. Leeks. Peas. Squash. Tomatoes. Peppers. Radishes. Fruits: Apples. Apricots. Avocado. Berries. Bananas. Cherries. Dates. Figs. Grapes. Lemons. Melon. Oranges. Peaches. Plums. Pomegranate. Meats and other protein foods: Beans. Almonds. Sunflower seeds. Pine nuts. Peanuts. Cod.  Salmon. Scallops. Shrimp. Tuna. Tilapia. Clams. Oysters. Eggs. Dairy: Low-fat milk. Cheese. Greek yogurt. Beverages: Water. Red wine. Herbal tea. Fats and oils: Extra virgin olive oil. Avocado oil. Grape seed oil. Sweets and desserts: [place] yogurt with honey. Baked apples. Poached pears. Trail mix. Seasoning and other foods: Basil. Cilantro. Coriander. Cumin. Mint. Parsley. Sage. Rosemary. Tarragon. Garlic. Oregano. Thyme. Pepper. Balsalmic vinegar. Tahini. Hummus. Tomato sauce. Olives. Mushrooms. Limit these Grains: Prepackaged pasta or rice dishes. Prepackaged cereal with added sugar. Vegetables: Deep fried potatoes (french fries). Fruits: Fruit canned in syrup. Meats and other protein foods: Beef. Pork. Lamb. Poultry with skin. Hot dogs. [place]. Dairy: Ice cream. Sour cream. Whole milk. Beverages: Juice. Sugar-sweetened soft drinks. Beer. Liquor and spirits. Fats and oils: Butter. Canola oil. Vegetable oil. Beef fat (tallow). Lard. Sweets and desserts: Cookies. Cakes. Pies. Candy. Seasoning and other foods: Mayonnaise. Premade sauces and marinades. The items listed may not be a complete list. Talk with your dietitian about what dietary choices are right for you. Summary The Mediterranean diet includes both food and lifestyle choices. Eat a variety of fresh fruits and vegetables, beans, nuts, seeds, and whole grains. Limit the amount of red meat  and sweets that you eat. Talk with your health care provider about whether it is safe for you to drink red wine in moderation. This means 1 glass a day for nonpregnant women and 2 glasses a day for men. A glass of wine equals 5 oz (150 mL). This information is not intended to replace advice given to you by your health care provider. Make sure you discuss any questions you have with your health care provider. Document Released: 12/30/2015 Document Revised: 02/01/2016 Document Reviewed: 12/30/2015 Elsevier Interactive Patient Education  2017 [employer].

## 2023-12-19 NOTE — Progress Notes (Signed)
 Assessment/Plan:   Dementia of unclear etiology   Erik [name] is a very pleasant 83 y.o. 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 Patient is already on donepezil  10 mg daily and memantine10 mg twice daily by his PCP.  He is seen today in follow up for memory loss.  Memory is currently stable.  Etiology of dementia is still unclear although most recently, the patient has been developing parkinsonian features, Parkinson's disease may be on the differential at this time.   Follow up in 3 months. Continue donepezil  10 mg daily and memantine  10 mg twice daily, side effects discussed Recommend good control of her cardiovascular risk factors Referral to ST for speech difficulties Continue to control mood as per PCP  Tremors Patient shows intention tremors, increased tone, bilateral cogwheeling, tongue tremor, short stride, wide-based gait, microphonia, micrographia, all findings suspicious for Parkinson's disease.  He had significant difficulty drawing spirals or writing his name. Recommendations 1. Start carbidopa  (Sinemet ) 25/100mg  tablets.  Side effects discussed, including dizziness, nausea, vivid dreams, hallucinations Week 1: Take 1/2  tablet morning, 1/2 tab noon, and 1/2  tablet in evening/bedtime.  Week 2: Take 1/2 tablet in morning, 1/2 tablet noon, and 1 tablet in evening/bedtime.  Week 3: take 1/2 tablet in am, then 1 tab noon and then 1 tab in the evening  Week 4  & thereafter: Take 1 tablet three times daily.         Subjective:    This patient is accompanied in the office by his wife  who supplements the history.  Previous records as well as any outside records available were reviewed prior to  todays visit. Patient was last seen on 09/28/2023 with MMSE 14/30 (unable to perform MoCA)    Any changes in memory since last visit? About the same.  He may have more difficulty with remembering recent conversations and names.  LTM is fair. repeats oneself? Denies  Disoriented when walking into a room?  He may confuse some rooms when going to the bathroom   Leaving objects? Denies    Wandering behavior?  denies   Any personality changes since last visit?  A little different, more frustrated when the words don't come out Any worsening depression?:  Denies.   Hallucinations or paranoia?  He thinks he sees some animal is on the floor sometimes -wife says Seizures? denies    Any sleep changes?  Sleeps well, denies vivid dreams or nightmares, REM behavior or sleepwalking   Sleep apnea?   Denies.   Any hygiene concerns? Denies.  Independent of bathing and dressing?  He needs assistance due to mobility issues and tremors Does the patient needs help with medications?  Wife is in charge   Who is in charge of the finances?  Wife is in charge       Any changes in appetite?  denies.  However it is harder for him to feed himself due to the tremors.     Patient have trouble swallowing? Denies. Any significant drooling?No Does the patient cook?  No Any headaches?   denies   Any vision changes? Denies diplopia Chronic pain?  Endorsed at the knee, he is bone-on-bone .  He also has peripheral neuropathy, he is on PT  ambulates with  difficulty?  Endorsed, due to above.  He needs a wheelchair to go out a walker at home.   Recent falls or head injuries? Denies.     Unilateral weakness, numbness or tingling? denies   Any tremors?  Endorsed, as mentioned prior, initially it was thought to be due to the medications but continues to show resting and intention tremors.  He has trouble with certain activities such as feeding himself, or grabbing an object.  Any anosmia?  Denies   Any incontinence of urine?   Endorsed urge incontinence.  He had a acute cystitis in June 2025  Any bowel dysfunction?   Denies      Patient lives with his wife     Does the patient drive? No longer drives     Initial visit 09/28/2023 How long did patient have memory difficulties?  For about. 9 years, worse 10 weeks ago when he could not remember where he was and were things and rooms were, but he had a UTI-wife says. LTM is fair.  repeats oneself?  Denies  Disoriented when walking into a room? Endorsed, may confuse rooms when going to the bathroom. Leaving objects in unusual places?  Denies.   Wandering behavior? Denies.   Any personality changes, or depression, anxiety? Denies  Hallucinations or paranoia? Denies.   Seizures? Denies.    Any sleep changes?  Sleeps well denies frequent nightmares or dream reenactment  Sleep apnea? Denies.   Any hygiene concerns?  Denies.   Independent of bathing and dressing? needs assistance Does the patient need help with medications?  Wife is in charge   Who is in charge of the finances?Wife  is in charge     Any changes in appetite?   Denies.     Patient have trouble swallowing?  Denies.   Does the patient cook?  No, denies forgetting common recipes or kitchen accidents   Any headaches?  Denies.   Chronic pain? chronic knee pain bone on bone,  and peripheral neuropathy Ambulates with difficulty? He has significant difficulty ambulating, partially due to his knee pain, needs a wheelchair to go out, and a walker at home. knee replacement .HE shuffles, gait is short  Does not want PT Recent falls or head injuries? Denies.     Vision changes?  Denies any new issues.    Any strokelike symptoms? Denies.   Any tremors? Endorsed, initially though to be due to meds but he continues to have resting and on intention tremors  Any anosmia? Denies.   Any incontinence of urine? Endorsed, had recent UTI. He is not quite sure when he is going Any bowel dysfunction? Denies.       Patient lives with  wife   History of heavy alcohol  intake? Denies.   History of heavy tobacco use?  Quit 3 years ago Family history of dementia?  Father had AD   Does patient drive? No longer drives for the last year Retired Archivist for the [employer]  MRI of the brain, personally reviewed April 2025 without acute abnormalities, mild atrophy   PREVIOUS MEDICATIONS:   CURRENT MEDICATIONS:  Outpatient Encounter Medications as of 12/19/2023  Medication Sig   acetaminophen  (TYLENOL ) 500 MG tablet Take 500-1,000 mg by mouth every 6 (six) hours as needed (pain).   amLODipine  (NORVASC ) 5 MG tablet Take 1 tablet (5 mg total) by mouth daily as needed (for systolic blood pressure more than 160).   carbidopa -levodopa  (SINEMET  IR) 25-100 MG tablet 1. Start carbidopa  (Sinemet )  25/100mg  tablets.  Week 1: Take 1/2  tablet morning, 1/2 tab noon, and 1/2  tablet in evening/bedtime.  Week 2: Take 1/2 tablet in morning, 1/2 tablet noon, and 1 tablet in evening/bedtime.  Week 3: take 1/2 tablet in am, then 1 tab noon and then 1 tab in the evening  Week 4  & thereafter: Take 1 tablet three times daily.   Carboxymethylcellulose Sodium (THERATEARS) 0.25 % SOLN Place 1 drop into both eyes See admin instructions. 2-4 times daily   Cholecalciferol (VITAMIN D3) 50 MCG (2000 UT) TABS Take by mouth.   digoxin  (LANOXIN ) 0.125 MG tablet TAKE 1 TABLET BY MOUTH DAILY. EXCEPT ON SUNDAY   donepezil  (ARICEPT ) 10 MG tablet Take 10 mg by mouth at bedtime.   ELIQUIS  5 MG TABS tablet TAKE 1 TABLET BY MOUTH TWICE A DAY   FIBER PO Take 1 capsule by mouth daily.   finasteride  (PROSCAR ) 5 MG tablet Take 5 mg by mouth at bedtime.    INCRUSE ELLIPTA  62.5 MCG/INH AEPB Take 1 puff by mouth daily.   ipratropium (ATROVENT) 0.03 % nasal spray Place 2 sprays into the nose 3 (three) times daily.   memantine  (NAMENDA ) 10 MG tablet Take one tablet twice a day   metoprolol  succinate (TOPROL -XL) 25 MG 24 hr tablet Take  1 tablet (25 mg total) by mouth daily.   Multiple Vitamins-Minerals (PRESERVISION AREDS 2+MULTI VIT PO) Take 1 capsule by mouth in the morning and at bedtime.   nitrofurantoin, macrocrystal-monohydrate, (MACROBID) 100 MG capsule Take 100 mg by mouth daily.   nitroGLYCERIN  (NITROSTAT ) 0.4 MG SL tablet Place 1 tablet (0.4 mg total) under the tongue every 5 (five) minutes as needed for chest pain (MAX 3 TABLETS).   polyethylene glycol (MIRALAX  / GLYCOLAX ) 17 g packet Take 17 g by mouth daily as needed for mild constipation.   tamsulosin  (FLOMAX ) 0.4 MG CAPS capsule Take 1 capsule (0.4 mg total) by mouth daily after breakfast.   No facility-administered encounter medications on file as of 12/19/2023.       09/28/2023   12:00 PM  MMSE - Mini Mental State Exam  Orientation to time 0  Orientation to Place 2  Registration 3  Attention/ Calculation 0  Recall 3  Language- name 2 objects 2  Language- repeat 1  Language- follow 3 step command 2  Language- read & follow direction 1  Write a sentence 0  Copy design 0  Total score 14       No data to display          Objective:     PHYSICAL EXAMINATION:    VITALS:   Vitals:   12/19/23 1429  BP: (!) 143/84  Pulse: 60  SpO2: 97%  Height: 5' 11 (1.803 m)    GEN:  The patient appears stated age and is in NAD. HEENT:  Normocephalic, atraumatic.   Neurological examination:  General: NAD, well-groomed, appears stated age. Orientation: The patient is alert. Oriented to person, place, not to date Cranial nerves: There is good facial symmetry.The speech is not fluent but clear, hypophonia noted.  No aphasia or dysarthria. Fund of knowledge is reduced. Recent and remote memory are impaired. Attention and concentration are reduced. Able to name objects and unable to repeat phrases.  Hearing is intact to conversational tone.   Sensation: Sensation is intact to light touch throughout Motor: Strength is at least antigravity x4. DTR's 1/4 in  UE/LE     Movement examination: Tone: There is increased  tone in the UE/LE +L>R, worse cogwheeling BUE when compared to prior Abnormal movements: Bilateral resting and intention tremors, right greater than left, possible tremors in both lower extremities are noted or intention.  No myoclonus.  Questionable asterixis on the left hand.   Coordination:  There is no decremation with RAM's.  Abnormal finger to nose (difficulty understanding the command for the last) Gait and Station: The patient has difficulty arising out of a deep-seated chair without the use of the hands.  He was unable to ambulate, unable to do a few steps, gait was cautious and wide base.  Stride is short, he bends forward.      Thank you for allowing us  the opportunity to participate in the care of this nice patient. Please do not hesitate to contact us  for any questions or concerns.   Total time spent on today's visit was 36 minutes dedicated to this patient today, preparing to see patient, examining the patient, ordering tests and/or medications and counseling the patient, documenting clinical information in the EHR or other health record, independently interpreting results and communicating results to the patient/family, discussing treatment and goals, answering patient's questions and coordinating care.  Cc:  Leonel Cole, MD  [name] 12/19/2023 4:41 PM

## 2023-12-27 ENCOUNTER — Telehealth: Payer: Self-pay | Admitting: Physician Assistant

## 2023-12-27 NOTE — Telephone Encounter (Signed)
 Pt.s wife has not heard back from us  on if Dr. Evonnie is to work him in before Pt.s next [employer] , needs call bk with update

## 2023-12-27 NOTE — Telephone Encounter (Signed)
 Left message on machine for patient to call back.

## 2023-12-31 ENCOUNTER — Telehealth: Payer: Self-pay | Admitting: Physician Assistant

## 2023-12-31 ENCOUNTER — Other Ambulatory Visit: Payer: Self-pay

## 2023-12-31 ENCOUNTER — Ambulatory Visit: Admitting: Speech Pathology

## 2023-12-31 MED ORDER — CARBIDOPA-LEVODOPA 25-100 MG PO TABS: ORAL_TABLET | ORAL | 2 refills | Status: DC

## 2023-12-31 NOTE — Telephone Encounter (Signed)
 Left a message on the VM trying to reach a nurse

## 2023-12-31 NOTE — Telephone Encounter (Signed)
 Pt wife said the pharmacy does not have this RX. Carbi/levo. They dont have the one that christy said was sent 12/19/23 or the one from today. The pharmacist said christy can call them and give it to them OTP. She is unsure why they have not got it.

## 2023-12-31 NOTE — Telephone Encounter (Signed)
 Called and spoke with [name]. She stated that Camie told her she was going to speak with Dr. Evonnie about the pt having Parkinson's symptoms and possibly getting on a low dose of medication to help with his shaking before he see's Camie in October. And to make sure her diagnosis of Parkinson's is correct.

## 2023-12-31 NOTE — Telephone Encounter (Signed)
 Resent via fax. She thanked me for calling, very apprecative

## 2023-12-31 NOTE — Telephone Encounter (Signed)
 I personally called the pharmacist

## 2023-12-31 NOTE — Telephone Encounter (Signed)
 Camie did send in the medication for Parkinson's disease, the Carbidopa -Levodopa  (Sinemet ), has he started taking it? If yes, how is he doing on the medication? Thanks

## 2024-01-03 ENCOUNTER — Telehealth: Payer: Self-pay | Admitting: Physician Assistant

## 2024-01-03 NOTE — Telephone Encounter (Signed)
 Pt.s wife cld and wanted Bari to be aware that he can not take the time release Rx due to his AFIB and that she was told by [facility] that she would speak to Tat about seeing Dr. Evonnie and would like an update, xplnd Dina is out of office until End of Sept.

## 2024-01-04 NOTE — Telephone Encounter (Signed)
 Spoke to pt wife below message and voiced understanding.

## 2024-01-04 NOTE — Telephone Encounter (Signed)
 The prescription for carbidopa /levodopa  is immediate-release, not time-release. I checked for interactions with his medications, I don't see any interaction with his afib medications. The main thing to monitor is his BP as she starts him on the immediate-release Carbidopa /Levodopa . Will have to wait for Camie to come back re: Dr. Evonnie evaluation. Thanks

## 2024-01-14 ENCOUNTER — Other Ambulatory Visit (INDEPENDENT_AMBULATORY_CARE_PROVIDER_SITE_OTHER)

## 2024-01-14 ENCOUNTER — Ambulatory Visit (INDEPENDENT_AMBULATORY_CARE_PROVIDER_SITE_OTHER): Admitting: Orthopaedic Surgery

## 2024-01-14 DIAGNOSIS — G8929 Other chronic pain: Secondary | ICD-10-CM | POA: Diagnosis not present

## 2024-01-14 DIAGNOSIS — M25562 Pain in left knee: Secondary | ICD-10-CM

## 2024-01-14 DIAGNOSIS — M25561 Pain in right knee: Secondary | ICD-10-CM | POA: Diagnosis not present

## 2024-01-14 DIAGNOSIS — M170 Bilateral primary osteoarthritis of knee: Secondary | ICD-10-CM | POA: Diagnosis not present

## 2024-01-14 DIAGNOSIS — M1711 Unilateral primary osteoarthritis, right knee: Secondary | ICD-10-CM

## 2024-01-14 DIAGNOSIS — M1712 Unilateral primary osteoarthritis, left knee: Secondary | ICD-10-CM

## 2024-01-14 MED ORDER — METHYLPREDNISOLONE ACETATE 40 MG/ML IJ SUSP
40.0000 mg | INTRAMUSCULAR | Status: AC | PRN
  Administered 2024-01-14: 40 mg via INTRA_ARTICULAR

## 2024-01-14 MED ORDER — LIDOCAINE HCL 1 % IJ SOLN
3.0000 mL | INTRAMUSCULAR | Status: AC | PRN
Start: 2024-01-14 — End: 2024-01-14
  Administered 2024-01-14: 3 mL

## 2024-01-14 NOTE — Progress Notes (Signed)
 The patient is a 83 year old gentleman that I am seeing for the first time as a patient but I have seen him before because his wife is a patient of mine.  He has been dealing with a neurologic disorder which is potentially Parkinson disease.  He sees neurologist coming up but he has been on Sinemet  and Aricept .  He does ambulate a rolling walker.  He gets some therapy at home but sometimes therapy can be too much for him due to the stiffness in his joints and can make him feel worse.  He is on Eliquis  so he cannot take anti-inflammatories.  He has a long list of medications and I was able to review these as well as his past medical history within epic.  On exam both knees are not incredibly stiff as well as all of his joints certainly pointing to a musculoskeletal as well as neurologic issues.  It is hard to get his knees through full complete motion due to stiffness.  His hips fortunately move smoothly and fluidly.  An AP and lateral of both knees shows tricompartment arthritis with mainly varus malalignment and medial joint space and patellofemoral narrowing with osteophytes in all 3 compartments.  He does have significant arthritis in both his knees and we did recommend a steroid injection in both knees today.  We can then see him back in a month to see if he is a candidate for considering hyaluronic acid for his knees.  He did tolerate the steroid injections in both his knees today.    Procedure Note  Patient: Erik [name]             Date of Birth: May 22, 1941           MRN: [id number]             Visit Date: 01/14/2024  Procedures: Visit Diagnoses:  1. Chronic pain of left knee   2. Chronic pain of right knee   3. Unilateral primary osteoarthritis, right knee   4. Unilateral primary osteoarthritis, left knee     Large Joint Inj: R knee on 01/14/2024 10:32 AM Indications: diagnostic evaluation and pain Details: 22 G 1.5 in needle, superolateral approach  Arthrogram:  No  Medications: 3 mL lidocaine  1 %; 40 mg methylPREDNISolone  acetate 40 MG/ML Outcome: tolerated well, no immediate complications Procedure, treatment alternatives, risks and benefits explained, specific risks discussed. Consent was given by the patient. Immediately prior to procedure a time out was called to verify the correct patient, procedure, equipment, support staff and site/side marked as required. Patient was prepped and draped in the usual sterile fashion.    Large Joint Inj: L knee on 01/14/2024 10:32 AM Indications: diagnostic evaluation and pain Details: 22 G 1.5 in needle, superolateral approach  Arthrogram: No  Medications: 3 mL lidocaine  1 %; 40 mg methylPREDNISolone  acetate 40 MG/ML Outcome: tolerated well, no immediate complications Procedure, treatment alternatives, risks and benefits explained, specific risks discussed. Consent was given by the patient. Immediately prior to procedure a time out was called to verify the correct patient, procedure, equipment, support staff and site/side marked as required. Patient was prepped and draped in the usual sterile fashion.

## 2024-01-18 ENCOUNTER — Ambulatory Visit: Admitting: Physician Assistant

## 2024-01-30 ENCOUNTER — Telehealth: Payer: Self-pay | Admitting: Internal Medicine

## 2024-01-30 NOTE — Telephone Encounter (Signed)
 Returned call to Pt's wife.  Discussed BP's reported.  Advised that overall his blood pressures look good.  Pt has PRN amlodipine  to take if systolic greater than 160.  Advised Pt should limit his salt intake.  Per wife, Pt loves salt and had gotten the salt shaker.  She states she has since confiscated it and given him his salt substitute to use.  Pt has been active with PT/OT/ST.  Started Sinemet  because his physician thinks he may have Parkinson's.  Reassured wife that blood pressure's were stable since last OV with Dr. Waddell.    Wife indicates understanding and thanked nurse for call back.

## 2024-01-30 NOTE — Telephone Encounter (Signed)
 Pt c/o BP issue: STAT if pt c/o blurred vision, one-sided weakness or slurred speech.  STAT if BP is GREATER than 180/120 TODAY.  STAT if BP is LESS than 90/60 and SYMPTOMATIC TODAY  1. What is your BP concern? Pt significant other concerned that pt bp is high   2. Have you taken any BP medication today? He took digoxin  in the AM, takes metoprolol  in the evening  She also wanted to note that pt started new medication - carbidopa -levodopa  (SINEMET  IR) 25-100 MG tablet     3. What are your last 5 BP readings?  119/88 hr 77 - 9/10  136/89 hr 69 - 9/9 148/96 hr 59 - 9/8 111/83 hr 74 - 9/7   4. Are you having any other symptoms (ex. Dizziness, headache, blurred vision, passed out)? No

## 2024-02-11 ENCOUNTER — Encounter: Payer: Self-pay | Admitting: Orthopaedic Surgery

## 2024-02-11 ENCOUNTER — Ambulatory Visit: Admitting: Orthopaedic Surgery

## 2024-02-11 DIAGNOSIS — M1711 Unilateral primary osteoarthritis, right knee: Secondary | ICD-10-CM

## 2024-02-11 DIAGNOSIS — M25561 Pain in right knee: Secondary | ICD-10-CM

## 2024-02-11 DIAGNOSIS — G8929 Other chronic pain: Secondary | ICD-10-CM

## 2024-02-11 DIAGNOSIS — M25562 Pain in left knee: Secondary | ICD-10-CM

## 2024-02-11 DIAGNOSIS — M1712 Unilateral primary osteoarthritis, left knee: Secondary | ICD-10-CM

## 2024-02-11 NOTE — Progress Notes (Signed)
 The patient is an 83 year old gentleman who comes in with his wife today for follow-up after having steroid injections in both his knees a month ago.  He states the injection is really not helpful.  His wife said he is not complaining a lot but he does have Parkinson disease and deals with significant knee stiffness on both his knees.  He is on Eliquis  and has significant comorbidities.  She does state that his other physicians stated that he can be off Eliquis  for knee replacement surgery.  However, thus far he is not interested in going to the rehab that he takes for knee replacement surgery.  Both knees are still quite stiff with varus malalignment and some of the stiffness is related to arthritis and some of this is his Parkinson's disease.  At this point I do feel he is a candidate for at least trying hyaluronic acid for his knees and they are both interested in trying that given the failure of other conservative treatment measures.  Will see if we get this approved in order for him and see him back in follow-up to place hyaluronic acid in both knees to treat osteoarthritis pain.  This patient is diagnosed with osteoarthritis of the knee(s).    Radiographs show evidence of joint space narrowing, osteophytes, subchondral sclerosis and/or subchondral cysts.  This patient has knee pain which interferes with functional and activities of daily living.    This patient has experienced inadequate response, adverse effects and/or intolerance with conservative treatments such as acetaminophen , NSAIDS, topical creams, physical therapy or regular exercise, knee bracing and/or weight loss.   This patient has experienced inadequate response or has a contraindication to intra articular steroid injections for at least 3 months.   This patient is not scheduled to have a total knee replacement within 6 months of starting treatment with viscosupplementation.

## 2024-02-12 ENCOUNTER — Other Ambulatory Visit: Payer: Self-pay

## 2024-02-12 DIAGNOSIS — G8929 Other chronic pain: Secondary | ICD-10-CM

## 2024-02-21 ENCOUNTER — Telehealth: Payer: Self-pay | Admitting: Physician Assistant

## 2024-02-21 NOTE — Telephone Encounter (Signed)
 Pt. Was to have a appt with Dr.Tat by Dina calling about update on appt to get diagnosis

## 2024-02-21 NOTE — Telephone Encounter (Signed)
 Patient is scheduled for 03/27/2024 at 9:45 with Dr.Tat

## 2024-03-04 ENCOUNTER — Ambulatory Visit: Admitting: Physician Assistant

## 2024-03-07 ENCOUNTER — Other Ambulatory Visit: Payer: Self-pay

## 2024-03-10 ENCOUNTER — Ambulatory Visit: Admitting: Neurology

## 2024-03-10 ENCOUNTER — Encounter: Payer: Self-pay | Admitting: Neurology

## 2024-03-10 VITALS — BP 124/76 | HR 67 | Ht 71.0 in | Wt 170.0 lb

## 2024-03-10 DIAGNOSIS — R413 Other amnesia: Secondary | ICD-10-CM

## 2024-03-10 DIAGNOSIS — G20A1 Parkinson's disease without dyskinesia, without mention of fluctuations: Secondary | ICD-10-CM

## 2024-03-10 NOTE — Progress Notes (Signed)
 Assessment/Plan:   1.  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  I told them that he likely does have Parkinson's disease, but certainly time will tell, and also it would be helpful for me to examine him off of medication.  In addition, we discussed in detail DaTscan along with its risks and benefits.  Ultimately, after lengthy discussion, they decided to hold off on that.  I think that is fairly reasonable.  - I did ask them to move the timing of the medication so that he take carbidopa /levodopa  25/100, 1 tablet at 7 AM/11 AM/4 PM.  Discussed potential interaction with protein and how to take that in relationship to protein.  - Discussed the importance of safe, cardiovascular exercise programs.  Discussed community exercise programs that were free/low-cost.  Discussed physical therapy programs.  He will let me know if he wants a referral to the physical therapy programs.  He has just recently finished some home physical therapy.  Told him that, in general, going to physical therapy is a little bit more robust.  - Discussed patient and caregiver support groups.  Information was given.   2.  Memory change  - Is on Aricept  and memantine .  These were started by primary care sometime ago, and memantine  increased by Erik [name].  - Discussed mental and physical exercises.  3.  Patient has a follow-up appointment with Erik [name] next week.  They asked me if they should keep.  I think it is okay to cancel that appointment and follow-up with her in the next 4 months.  They agreed.  Subjective:   Erik [name] was seen today in the movement disorders clinic for neurologic consultation at the request of Erik [name].  The consultation is for the evaluation of Parkinsons disease.  Pt with spouse who supplements hx.  Medical records made available to me are reviewed.  Patient first saw Erik on  May 9 for memory change.  At that point in time, she noted flat affect, resting and intention tremor, hypophonia, gait changes and there was a suspicion for Parkinson's disease.  Patient had an MRI of the brain prior to seeing Erik.  This was done September 07, 2023 and was unremarkable.  Patient was already on donepezil  and memantine .  His memantine  was increased on that visit.  He followed up July 30 and was started on levodopa  at that visit.  Patient has been on that medication for just over 2 months.  Patient requested a second opinion and is here for that today.  I did speak with Erik [name] about this case.  Current movement d/o medications: carbidopa /levodopa  25/100, 7am/noon-1pm/7-8pm   Specific Symptoms:  Tremor: some when eating per wife, mostly the R but he is R handed; carbidopa /levodopa  25/100 seem to have helped Family hx of similar:  No. Voice: yes, low speech Sleep:   Vivid Dreams:  No.  Acting out dreams:  some laughing but no falling OOB, hitting or jerking Wet Pillows: occ at night but not consistent Postural symptoms:  Yes.  , they think from back/knee issues  Falls?  Only 1 fall and that was about 3 months ago and he was not using walker and was able to get right back up Bradykinesia symptoms: he denies trouble getting out of chair/couch/car; he denies shuffle; is slower with ambulation/movements Loss of smell:  Yes.   Loss of taste:  No. Urinary Incontinence:  No.; has nocturia; wears depends preventatively day and night but usually doesn't need it Difficulty Swallowing:  No. Handwriting, micrographia: Yes.   Trouble with ADL's:  Yes.   - had covid early on in pandemic and never really recovered strength after that Depression:  Yes.   Memory changes:  Yes.  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  MRI in 2012 did not show acute infarct  Hallucinations:  No.  visual distortions: No. N/V:  No. Lightheaded:  No.  Syncope:  No. Diplopia:  Yes.  , binocular only - wife wasn't aware.   Been going on for years Prior exposure to reglan/antipsychotics: No.     ALLERGIES:   Allergies  Allergen Reactions   Albuterol  Sulfate [Albuterol ] Other (See Comments)    rapid heartbeat; afib   Hyoscyamine Sulfate     Other reaction(s): urinary retention   Adhesive [Tape] Rash and Other (See Comments)    New heart monitor/patch (adhesive) broke out the skin and left a large red patch (still visible after 3 weeks   Warfarin And Related Other (See Comments)    Patient's INR could never get regulated while on this    CURRENT MEDICATIONS:  Current Meds  Medication Sig   acetaminophen  (TYLENOL ) 500 MG tablet Take 500-1,000 mg by mouth every 6 (six) hours as needed (pain).   amLODipine  (NORVASC ) 5 MG tablet Take 1 tablet (5 mg total) by mouth daily as needed (for systolic blood pressure more than 160).   carbidopa -levodopa  (SINEMET  IR) 25-100 MG tablet 1. Start carbidopa  (Sinemet ) 25/100mg  tablets.  Week 1: Take 1/2  tablet morning, 1/2 tab noon, and 1/2  tablet in evening/bedtime.  Week 2: Take 1/2 tablet in morning, 1/2 tablet noon, and 1 tablet in evening/bedtime.  Week 3: take 1/2 tablet in am, then 1 tab noon and then 1 tab in the evening  Week 4  & thereafter: Take 1 tablet three times daily.   Carboxymethylcellulose Sodium (THERATEARS) 0.25 % SOLN Place 1 drop into both eyes See admin instructions. 2-4 times daily   Cholecalciferol (VITAMIN D3) 50 MCG (2000 UT) TABS Take by mouth.   digoxin  (LANOXIN ) 0.125 MG tablet TAKE 1 TABLET BY MOUTH DAILY. EXCEPT ON SUNDAY   donepezil  (ARICEPT ) 10 MG tablet Take 10 mg by mouth at bedtime.   ELIQUIS  5 MG TABS tablet TAKE 1 TABLET BY MOUTH TWICE A DAY   FIBER PO Take 1 capsule by mouth daily.   finasteride  (PROSCAR ) 5 MG tablet Take 5 mg by mouth at bedtime.    INCRUSE ELLIPTA  62.5 MCG/INH AEPB Take 1 puff by mouth daily.   ipratropium (ATROVENT) 0.03 % nasal spray Place 2  sprays into the nose 3 (three) times daily.   memantine  (NAMENDA ) 10 MG tablet Take one tablet twice a day   metoprolol  succinate (TOPROL -XL) 25 MG 24 hr tablet Take 1 tablet (25 mg total) by mouth daily.   Multiple Vitamins-Minerals (PRESERVISION AREDS 2+MULTI VIT PO) Take 1 capsule by mouth in the morning and at bedtime.   nitrofurantoin, macrocrystal-monohydrate, (MACROBID) 100 MG capsule Take 100 mg by mouth daily.   nitroGLYCERIN  (NITROSTAT ) 0.4 MG SL tablet Place 1 tablet (0.4 mg total) under the tongue every 5 (five) minutes as needed for chest pain (MAX 3 TABLETS).   polyethylene glycol (MIRALAX  / GLYCOLAX ) 17 g packet Take 17 g by mouth daily as needed for mild constipation.   tamsulosin  (FLOMAX )  0.4 MG CAPS capsule Take 1 capsule (0.4 mg total) by mouth daily after breakfast.     Objective:   VITALS:   Vitals:   03/10/24 1432  BP: 124/76  Pulse: 67  SpO2: 99%  Weight: 170 lb (77.1 kg)  Height: 5' 11 (1.803 m)    GEN:  The patient appears stated age and is in NAD. HEENT:  Normocephalic, atraumatic.  The mucous membranes are moist. The superficial temporal arteries are without ropiness or tenderness. CV:  RRR Lungs:  CTAB Neck/HEME:  There are no carotid bruits bilaterally.  Neurological examination:  Orientation: The patient is alert and oriented to person and place.  Provides some simple aspects of the history, but looks to his wife for details. Cranial nerves: There is good facial symmetry. Extraocular muscles are intact. The visual fields are full to confrontational testing. The speech is fluent and clear.  He is hypophonic.  Soft palate rises symmetrically and there is no tongue deviation. Hearing is intact to conversational tone. Sensation: Sensation is intact to light and pinprick throughout (facial, trunk, extremities). Vibration is intact at the bilateral ankle. There is no extinction with double simultaneous stimulation. There is no sensory dermatomal level  identified. Motor: Strength is 5/5 in the bilateral upper and lower extremities.   Shoulder shrug is equal and symmetric.  There is no pronator drift. Deep tendon reflexes: Deep tendon reflexes are 2-/4 at the bilateral biceps, triceps, brachioradialis, patella and achilles. Plantar responses are downgoing bilaterally.  Movement examination: Tone: There is mild increased tone in the RUE Abnormal movements: rare tremor in the RUE at rest Coordination:  There is so much apraxia that it is difficult to deterimine decremation Gait and Station: The patient pushes off to arise. The patient's stride length is decreased and he is forward flexed.    I have reviewed and interpreted the following labs independently   Chemistry      Component Value Date/Time   NA 138 11/21/2023 1508   NA 144 09/01/2021 0911   K 4.3 11/21/2023 1508   CL 103 11/21/2023 1508   CO2 24 11/21/2023 1508   BUN 16 11/21/2023 1508   BUN 19 09/01/2021 0911   CREATININE 0.94 11/21/2023 1508   CREATININE 1.24 (H) 03/01/2015 1023      Component Value Date/Time   CALCIUM 9.9 11/21/2023 1508   ALKPHOS 57 10/29/2023 0453   AST 15 10/29/2023 0453   ALT 15 10/29/2023 0453   BILITOT 1.1 10/29/2023 0453      Lab Results  Component Value Date   TSH 1.85 09/28/2023   Lab Results  Component Value Date   WBC 8.8 11/21/2023   HGB 15.7 11/21/2023   HCT 48.6 11/21/2023   MCV 98.2 11/21/2023   PLT 250 11/21/2023   Lab Results  Component Value Date   VITAMINB12 >2,000 (H) 09/28/2023      Total time spent on today's visit was 70 minutes, including both face-to-face time and nonface-to-face time.  Time included that spent on review of records (prior notes available to me/labs/imaging if pertinent), discussing treatment and goals, answering patient's questions and coordinating care.  Cc:  Leonel Cole, MD

## 2024-03-10 NOTE — Patient Instructions (Addendum)
 Take carbidopa /levodopa  25/100 at 7am/11am/4pm    As a reminder, carbidopa /levodopa  can be taken at the same time as a carbohydrate, but we like to have you take your pill either 30 minutes before a protein source or 1 hour after as protein can interfere with carbidopa /levodopa  absorption.   Let us  know if you would like a Rx for PT at [facility].  The physicians and staff at [facility] are committed to providing excellent care. You may receive a survey requesting feedback about your experience at our office. We strive to receive very good responses to the survey questions. If you feel that your experience would prevent you from giving the office a very good  response, please contact our office to try to remedy the situation. We may be reached at [phone]. Thank you for taking the time out of your busy day to complete the survey.

## 2024-03-11 ENCOUNTER — Other Ambulatory Visit: Payer: Self-pay

## 2024-03-11 MED ORDER — CARBIDOPA-LEVODOPA 25-100 MG PO TABS: ORAL_TABLET | ORAL | 2 refills | Status: AC

## 2024-03-17 ENCOUNTER — Encounter: Payer: Self-pay | Admitting: Physician Assistant

## 2024-03-17 ENCOUNTER — Ambulatory Visit: Admitting: Physician Assistant

## 2024-03-17 DIAGNOSIS — M170 Bilateral primary osteoarthritis of knee: Secondary | ICD-10-CM

## 2024-03-17 DIAGNOSIS — M1711 Unilateral primary osteoarthritis, right knee: Secondary | ICD-10-CM

## 2024-03-17 DIAGNOSIS — M1712 Unilateral primary osteoarthritis, left knee: Secondary | ICD-10-CM

## 2024-03-17 MED ORDER — SODIUM HYALURONATE 60 MG/3ML IX PRSY
60.0000 mg | PREFILLED_SYRINGE | INTRA_ARTICULAR | Status: AC | PRN
  Administered 2024-03-17: 60 mg via INTRA_ARTICULAR

## 2024-03-17 NOTE — Progress Notes (Signed)
   Procedure Note  Patient: Erik [name]             Date of Birth: [birth year]           MRN: [id number]             Visit Date: 03/17/2024 HPI: Erik [name] comes in today for bilateral Durlolane injections.  He is unable to take NSAIDs due to being on Eliquis .  He does take Tylenol  for his knee pain but this is not controlling his pain.  He is also a truck therapy and regular exercise.  Steroid injections have not been beneficial in the past.  He has no scheduled surgery on either knee in the next 6 months.  Physical exam: Bilateral knees he has near full extension bilaterally flexion to beyond 90 degrees.  No abnormal warmth erythema or effusion.  Procedures: Visit Diagnoses:  1. Unilateral primary osteoarthritis, right knee   2. Unilateral primary osteoarthritis, left knee     Large Joint Inj: bilateral knee on 03/17/2024 1:36 PM Indications: pain Details: 22 G 1.5 in needle, anterolateral approach  Arthrogram: No  Medications (Right): 60 mg Sodium Hyaluronate 60 MG/3ML Medications (Left): 60 mg Sodium Hyaluronate 60 MG/3ML Outcome: tolerated well, no immediate complications Procedure, treatment alternatives, risks and benefits explained, specific risks discussed. Consent was given by the patient. Immediately prior to procedure a time out was called to verify the correct patient, procedure, equipment, support staff and site/side marked as required. Patient was prepped and draped in the usual sterile fashion.    Plan: He will follow-up with us  as needed knows to wait 6 months between injections.  Questions encouraged and answered

## 2024-03-20 ENCOUNTER — Ambulatory Visit: Admitting: Physician Assistant

## 2024-03-24 ENCOUNTER — Encounter: Payer: Self-pay | Admitting: Radiology

## 2024-03-27 ENCOUNTER — Ambulatory Visit: Admitting: Neurology

## 2024-04-09 ENCOUNTER — Telehealth: Payer: Self-pay | Admitting: Physician Assistant

## 2024-04-09 NOTE — Telephone Encounter (Signed)
 Pt's wife  Viktoria called in this morning and she stated that she would like too see if something could be prescribe to her husband to relax him a night. Viktoria stated that pt is getting up and down all night  going to the bathroom so he is night sleeping well. [name] is not sleeping due to Pt's getting up and down all night. Thanks

## 2024-04-10 ENCOUNTER — Other Ambulatory Visit: Payer: Self-pay

## 2024-04-10 ENCOUNTER — Telehealth: Payer: Self-pay | Admitting: Internal Medicine

## 2024-04-10 MED ORDER — TRAZODONE HCL 50 MG PO TABS: ORAL_TABLET | ORAL | 1 refills | Status: AC

## 2024-04-10 NOTE — Telephone Encounter (Signed)
 Spoke with the patient's spouse who states that the patient's pharmacist advised that he contact cardiology to confirm that it was okay for him to take trazadone. Patient has persistent afib.

## 2024-04-10 NOTE — Telephone Encounter (Signed)
 Pt c/o medication issue:  1. Name of Medication:  Trazodone 25 MG at night   2. How are you currently taking this medication (dosage and times per day)?   3. Are you having a reaction (difficulty breathing--STAT)?   4. What is your medication issue?   [name] says Trazodone 25 MG has been prescribed, but she has concerns that it may cause afib. She would like to know what Dr. Waddell recommends. Please advise.

## 2024-04-10 NOTE — Telephone Encounter (Signed)
 Erik [name] called back in stating she was wanting to follow up from her call yesterday. She had not heard back.

## 2024-04-11 ENCOUNTER — Other Ambulatory Visit: Payer: Self-pay | Admitting: Internal Medicine

## 2024-04-11 NOTE — Telephone Encounter (Signed)
 I advised to patient wife and was notiifed of this. Thanked us  for calling.

## 2024-04-13 ENCOUNTER — Other Ambulatory Visit: Payer: Self-pay

## 2024-04-13 ENCOUNTER — Emergency Department (HOSPITAL_COMMUNITY)

## 2024-04-13 ENCOUNTER — Encounter (HOSPITAL_COMMUNITY): Payer: Self-pay

## 2024-04-13 ENCOUNTER — Inpatient Hospital Stay (HOSPITAL_COMMUNITY)
Admission: EM | Admit: 2024-04-13 | Discharge: 2024-04-19 | DRG: 640 | Disposition: A | Discharge status: Home Health | Attending: Family Medicine | Admitting: Family Medicine

## 2024-04-13 DIAGNOSIS — I10 Essential (primary) hypertension: Secondary | ICD-10-CM | POA: Diagnosis present

## 2024-04-13 DIAGNOSIS — Z79899 Other long term (current) drug therapy: Secondary | ICD-10-CM

## 2024-04-13 DIAGNOSIS — Z7901 Long term (current) use of anticoagulants: Secondary | ICD-10-CM

## 2024-04-13 DIAGNOSIS — I482 Chronic atrial fibrillation, unspecified: Secondary | ICD-10-CM | POA: Diagnosis present

## 2024-04-13 DIAGNOSIS — M170 Bilateral primary osteoarthritis of knee: Secondary | ICD-10-CM | POA: Diagnosis present

## 2024-04-13 DIAGNOSIS — R443 Hallucinations, unspecified: Secondary | ICD-10-CM | POA: Diagnosis present

## 2024-04-13 DIAGNOSIS — I679 Cerebrovascular disease, unspecified: Secondary | ICD-10-CM | POA: Diagnosis present

## 2024-04-13 DIAGNOSIS — E860 Dehydration: Secondary | ICD-10-CM | POA: Diagnosis present

## 2024-04-13 DIAGNOSIS — I4892 Unspecified atrial flutter: Secondary | ICD-10-CM | POA: Diagnosis present

## 2024-04-13 DIAGNOSIS — N400 Enlarged prostate without lower urinary tract symptoms: Secondary | ICD-10-CM | POA: Diagnosis present

## 2024-04-13 DIAGNOSIS — G9341 Metabolic encephalopathy: Secondary | ICD-10-CM | POA: Diagnosis not present

## 2024-04-13 DIAGNOSIS — Z8249 Family history of ischemic heart disease and other diseases of the circulatory system: Secondary | ICD-10-CM

## 2024-04-13 DIAGNOSIS — R531 Weakness: Secondary | ICD-10-CM

## 2024-04-13 DIAGNOSIS — E785 Hyperlipidemia, unspecified: Secondary | ICD-10-CM | POA: Diagnosis present

## 2024-04-13 DIAGNOSIS — G20A1 Parkinson's disease without dyskinesia, without mention of fluctuations: Secondary | ICD-10-CM | POA: Diagnosis present

## 2024-04-13 DIAGNOSIS — Z1152 Encounter for screening for COVID-19: Secondary | ICD-10-CM

## 2024-04-13 DIAGNOSIS — I2510 Atherosclerotic heart disease of native coronary artery without angina pectoris: Secondary | ICD-10-CM | POA: Diagnosis present

## 2024-04-13 DIAGNOSIS — R001 Bradycardia, unspecified: Secondary | ICD-10-CM | POA: Diagnosis present

## 2024-04-13 DIAGNOSIS — Z91048 Other nonmedicinal substance allergy status: Secondary | ICD-10-CM

## 2024-04-13 DIAGNOSIS — R2681 Unsteadiness on feet: Secondary | ICD-10-CM | POA: Diagnosis present

## 2024-04-13 DIAGNOSIS — Z9841 Cataract extraction status, right eye: Secondary | ICD-10-CM

## 2024-04-13 DIAGNOSIS — F0390 Unspecified dementia without behavioral disturbance: Secondary | ICD-10-CM | POA: Diagnosis present

## 2024-04-13 DIAGNOSIS — Z823 Family history of stroke: Secondary | ICD-10-CM

## 2024-04-13 DIAGNOSIS — Z888 Allergy status to other drugs, medicaments and biological substances status: Secondary | ICD-10-CM

## 2024-04-13 DIAGNOSIS — Z85828 Personal history of other malignant neoplasm of skin: Secondary | ICD-10-CM

## 2024-04-13 DIAGNOSIS — J9811 Atelectasis: Secondary | ICD-10-CM | POA: Diagnosis present

## 2024-04-13 DIAGNOSIS — Z87440 Personal history of urinary (tract) infections: Secondary | ICD-10-CM

## 2024-04-13 DIAGNOSIS — F02A2 Dementia in other diseases classified elsewhere, mild, with psychotic disturbance: Secondary | ICD-10-CM | POA: Diagnosis present

## 2024-04-13 DIAGNOSIS — R600 Localized edema: Secondary | ICD-10-CM | POA: Diagnosis present

## 2024-04-13 DIAGNOSIS — R410 Disorientation, unspecified: Principal | ICD-10-CM

## 2024-04-13 DIAGNOSIS — Z8673 Personal history of transient ischemic attack (TIA), and cerebral infarction without residual deficits: Secondary | ICD-10-CM

## 2024-04-13 DIAGNOSIS — Z8261 Family history of arthritis: Secondary | ICD-10-CM

## 2024-04-13 DIAGNOSIS — J449 Chronic obstructive pulmonary disease, unspecified: Secondary | ICD-10-CM | POA: Diagnosis present

## 2024-04-13 DIAGNOSIS — Z9842 Cataract extraction status, left eye: Secondary | ICD-10-CM

## 2024-04-13 DIAGNOSIS — Z87891 Personal history of nicotine dependence: Secondary | ICD-10-CM

## 2024-04-13 LAB — RESP PANEL BY RT-PCR (RSV, FLU A&B, COVID)  RVPGX2
Influenza A by PCR: NEGATIVE
Influenza B by PCR: NEGATIVE
Resp Syncytial Virus by PCR: NEGATIVE
SARS Coronavirus 2 by RT PCR: NEGATIVE

## 2024-04-13 LAB — URINALYSIS, W/ REFLEX TO CULTURE (INFECTION SUSPECTED)
Bilirubin Urine: NEGATIVE
Glucose, UA: NEGATIVE mg/dL
Hgb urine dipstick: NEGATIVE
Ketones, ur: 5 mg/dL — AB
Leukocytes,Ua: NEGATIVE
Nitrite: NEGATIVE
Protein, ur: NEGATIVE mg/dL
Specific Gravity, Urine: 1.024 (ref 1.005–1.030)
pH: 5 (ref 5.0–8.0)

## 2024-04-13 LAB — COMPREHENSIVE METABOLIC PANEL WITH GFR
ALT: 9 U/L (ref 0–44)
AST: 16 U/L (ref 15–41)
Albumin: 3.4 g/dL — ABNORMAL LOW (ref 3.5–5.0)
Alkaline Phosphatase: 61 U/L (ref 38–126)
Anion gap: 12 (ref 5–15)
BUN: 22 mg/dL (ref 8–23)
CO2: 22 mmol/L (ref 22–32)
Calcium: 9 mg/dL (ref 8.9–10.3)
Chloride: 105 mmol/L (ref 98–111)
Creatinine, Ser: 1 mg/dL (ref 0.61–1.24)
GFR, Estimated: >60 mL/min (ref >60)
Glucose, Bld: 83 mg/dL (ref 70–99)
Potassium: 4.4 mmol/L (ref 3.5–5.1)
Sodium: 139 mmol/L (ref 135–145)
Total Bilirubin: 2.1 mg/dL — ABNORMAL HIGH (ref 0.0–1.2)
Total Protein: 7.1 g/dL (ref 6.5–8.1)

## 2024-04-13 LAB — CBC WITH DIFFERENTIAL/PLATELET
Abs Immature Granulocytes: 0.06 K/uL (ref 0.00–0.07)
Basophils Absolute: 0 K/uL (ref 0.0–0.1)
Basophils Relative: 0 %
Eosinophils Absolute: 0 K/uL (ref 0.0–0.5)
Eosinophils Relative: 0 %
HCT: 50.6 % (ref 39.0–52.0)
Hemoglobin: 16.6 g/dL (ref 13.0–17.0)
Immature Granulocytes: 1 %
Lymphocytes Relative: 2 %
Lymphs Abs: 0.3 K/uL — ABNORMAL LOW (ref 0.7–4.0)
MCH: 32 pg (ref 26.0–34.0)
MCHC: 32.8 g/dL (ref 30.0–36.0)
MCV: 97.5 fL (ref 80.0–100.0)
Monocytes Absolute: 0.4 K/uL (ref 0.1–1.0)
Monocytes Relative: 4 %
Neutro Abs: 11.3 K/uL — ABNORMAL HIGH (ref 1.7–7.7)
Neutrophils Relative %: 93 %
Platelets: 204 K/uL (ref 150–400)
RBC: 5.19 MIL/uL (ref 4.22–5.81)
RDW: 13.1 % (ref 11.5–15.5)
WBC: 12.1 K/uL — ABNORMAL HIGH (ref 4.0–10.5)
nRBC: 0 % (ref 0.0–0.2)

## 2024-04-13 LAB — PROTIME-INR
INR: 1.3 — ABNORMAL HIGH (ref 0.8–1.2)
Prothrombin Time: 17.1 s — ABNORMAL HIGH (ref 11.4–15.2)

## 2024-04-13 LAB — I-STAT CG4 LACTIC ACID, ED: Lactic Acid, Venous: 1.7 mmol/L (ref 0.5–1.9)

## 2024-04-13 LAB — TROPONIN I (HIGH SENSITIVITY)
Troponin I (High Sensitivity): 12 ng/L (ref <18)
Troponin I (High Sensitivity): 12 ng/L (ref <18)

## 2024-04-13 MED ORDER — SODIUM CHLORIDE 0.9 % IV SOLN
2.0000 g | Freq: Once | INTRAVENOUS | Status: AC
  Administered 2024-04-13: 2 g via INTRAVENOUS
  Filled 2024-04-13: qty 20

## 2024-04-13 MED ORDER — LACTATED RINGERS IV BOLUS (SEPSIS)
1000.0000 mL | Freq: Once | INTRAVENOUS | Status: AC
Start: 2024-04-13 — End: 2024-04-13
  Administered 2024-04-13: 1000 mL via INTRAVENOUS

## 2024-04-13 MED ORDER — LACTATED RINGERS IV SOLN: INTRAVENOUS | Status: DC

## 2024-04-13 MED ORDER — IOHEXOL 350 MG/ML SOLN
75.0000 mL | Freq: Once | INTRAVENOUS | Status: AC | PRN
  Administered 2024-04-13: 75 mL via INTRAVENOUS

## 2024-04-13 NOTE — H&P (Signed)
 History and Physical    Erik [name] FMW:[id number] DOB: May 21, 1941 DOA: 04/13/2024  PCP: Leonel Cole, MD  Patient coming from: Home  Chief Complaint: AMS  HPI: Erik [name] is a 83 y.o. male with medical history significant of Parkinson's disease, dementia, chronic A-fib/flutter on apixaban , CAD, hypertension, hyperlipidemia, stroke/TIA, BPH, COPD presenting to the ED via EMS for evaluation of altered mental status/delirium.  Patient is currently oriented to self and knows that he is at a hospital but otherwise very confused and not able to give any history.  He is not able to recognize his wife at bedside.  Wife states patient has been very confused since 3 PM today and also having generalized weakness.  He barely had the strength to get up when she tried to change his clothes.  He has not been drinking water for the past few days.  Wife states patient has had a mild cough but this is not new.  No fevers or headaches reported.  No shortness of breath reported.  Wife states yesterday night patient had complained of lower abdominal pain but subsequently had a bowel movement after which his abdominal pain resolved.  No vomiting reported.  Wife states patient has had UTIs multiple times in the past and every time it causes him to become confused.  No recent change in medications.  ED Course: Hypertensive with SBP up to 170s but remainder of vital signs stable.  Labs notable for WBC count 12.1, hemoglobin 16.6, sodium 139, glucose 83, creatinine 1.0, T. bili 2.1 (elevated in the past as well), transaminases and alkaline phosphatase normal, blood cultures in process, troponin negative x 2, BNP pending, lactic acid normal, UA with 5 ketones and not suggestive of infection, COVID/influenza/RSV PCR negative.  Chest x-ray showing bibasilar atelectasis and no active cardiopulmonary process.  CT head showing no acute intracranial abnormality.  CT abdomen pelvis negative for acute findings.  Patient unable  to safely ambulate due to generalized weakness.  Patient was given ceftriaxone  and 1 L LR.  Review of Systems:  Review of Systems  All other systems reviewed and are negative.   Past Medical History:  Diagnosis Date   Arthritis    both knees   Atrial fibrillation (HCC)    Paroxysmal, s/p PVI 11/11/10   Atrial flutter (HCC)    s/p RFCA  by GT   Basal cell carcinoma of ear    CAD (coronary artery disease)    a. cath 5/12: pLAD 25%, D1 25%, EF 65 % (done 2/2 abnormal myoview  with inferapical  ischemia)   Dyslipidemia    Dysrhythmia    HTN (hypertension)    Hx of echocardiogram    Echo (9/15):  Mild LVH, EF 55-60%, no RWMA, normal diast function, mild MR, mod LAE, mildly increased pulmonary pressures (PASP 30 mmHg).   Hypertension    Shortness of breath dyspnea    has a low heart rate   Snoring    a. sleep study 11/2012 neg for OSA; increased leg movement suspicious for primary movement d/o (ie restless leg syndrome)    Stroke (HCC)    mild stroke with  second heart ablation   TIA (transient ischemic attack)    11/12/10 post afib ablation    Past Surgical History:  Procedure Laterality Date   atrial flutter ablation     by GT   basal cell cancer resection     CARDIOVERSION  10/31/2011   Procedure: CARDIOVERSION;  Surgeon: Toribio JONELLE Fuel, MD;  Location:  [facility] ENDOSCOPY;  Service: Cardiovascular;  Laterality: N/A;   CATARACT EXTRACTION, BILATERAL     COLONOSCOPY     EYE SURGERY     growth under lid- x2   HEMORRHOID SURGERY     KNEE ARTHROSCOPY Bilateral    bilateral   SHOULDER ARTHROSCOPY Right 08/04/2015   Procedure: ARTHROSCOPY RIGHT SHOULDER;  Surgeon: Dempsey Sensor, MD;  Location: [facility];  Service: Orthopedics;  Laterality: Right;   TEE WITHOUT CARDIOVERSION  10/31/2011   Procedure: TRANSESOPHAGEAL ECHOCARDIOGRAM (TEE);  Surgeon: Toribio JONELLE Fuel, MD;  Location: [facility] ENDOSCOPY;  Service: Cardiovascular;  Laterality: N/A;     reports that he quit smoking about 48 years ago.  His smoking use included cigarettes. He started smoking about 68 years ago. He has a 30 pack-year smoking history. He has never used smokeless tobacco. He reports that he does not drink alcohol  and does not use drugs.  Allergies  Allergen Reactions   Albuterol  Sulfate [Albuterol ] Other (See Comments)    rapid heartbeat; afib   Hyoscyamine Sulfate     Other reaction(s): urinary retention   Adhesive [Tape] Rash and Other (See Comments)    New heart monitor/patch (adhesive) broke out the skin and left a large red patch (still visible after 3 weeks   Warfarin And Related Other (See Comments)    Patient's INR could never get regulated while on this    Family History  Problem Relation Age of Onset   Hypertension Mother    Stroke Mother    Heart disease Mother    Arthritis Mother    Healthy Sister    Heart attack Neg Hx     Prior to Admission medications   Medication Sig Start Date End Date Taking? Authorizing Provider  acetaminophen  (TYLENOL ) 500 MG tablet Take 500-1,000 mg by mouth every 6 (six) hours as needed (pain).   Yes [provider]  carbidopa -levodopa  (SINEMET  IR) 25-100 MG tablet Take 1 tablet three times daily. 03/11/24  Yes Georjean Darice HERO, MD  Carboxymethylcellulose Sodium (THERATEARS) 0.25 % SOLN Place 1 drop into both eyes See admin instructions. 2-4 times daily   Yes [provider]  Cholecalciferol (VITAMIN D3) 50 MCG (2000 UT) TABS Take 2,000 Units by mouth in the morning.   Yes [provider]  digoxin  (LANOXIN ) 0.125 MG tablet TAKE 1 TABLET BY MOUTH DAILY. EXCEPT ON SUNDAY 04/11/24  Yes Waddell Danelle ORN, MD  donepezil  (ARICEPT ) 10 MG tablet Take 10 mg by mouth at bedtime. 07/04/18  Yes [provider]  ELIQUIS  5 MG TABS tablet TAKE 1 TABLET BY MOUTH TWICE A DAY 03/21/18  Yes Waddell Danelle ORN, MD  FIBER PO Take 1 capsule by mouth daily.   Yes [provider]  finasteride  (PROSCAR ) 5 MG tablet Take 5 mg by mouth at bedtime.   08/08/17  Yes [provider]  INCRUSE ELLIPTA  62.5 MCG/INH AEPB Take 1 puff by mouth daily. 01/29/17  Yes [provider]  ipratropium (ATROVENT ) 0.03 % nasal spray Place 2 sprays into the nose 3 (three) times daily. 08/06/23  Yes [provider]  memantine  (NAMENDA ) 10 MG tablet Take one tablet twice a day 09/28/23  Yes Wertman, Sara E, PA-C  metoprolol  succinate (TOPROL -XL) 25 MG 24 hr tablet Take 1 tablet (25 mg total) by mouth daily. Patient taking differently: Take 25 mg by mouth at bedtime. 11/09/23  Yes Fenton, Clint R, PA  Multiple Vitamins-Minerals (PRESERVISION AREDS 2+MULTI VIT PO) Take 1 capsule by mouth in the morning  and at bedtime.   Yes [provider]  nitrofurantoin , macrocrystal-monohydrate, (MACROBID ) 100 MG capsule Take 100 mg by mouth daily. 11/21/23  Yes [provider]  polyethylene glycol (MIRALAX  / GLYCOLAX ) 17 g packet Take 17 g by mouth daily as needed for mild constipation. 06/15/20  Yes Malvina Ellen, MD  tamsulosin  (FLOMAX ) 0.4 MG CAPS capsule Take 1 capsule (0.4 mg total) by mouth daily after breakfast. 06/15/20  Yes Malvina Ellen, MD  traZODone  (DESYREL ) 50 MG tablet Take 1/2 tab po qhs Patient not taking: Reported on 04/13/2024 04/10/24   Wertman, Sara E, PA-C  amLODipine  (NORVASC ) 5 MG tablet Take 1 tablet (5 mg total) by mouth daily as needed (for systolic blood pressure more than 160). Patient not taking: Reported on 04/13/2024 10/31/23   Tobie Yetta HERO, MD  nitroGLYCERIN  (NITROSTAT ) 0.4 MG SL tablet Place 1 tablet (0.4 mg total) under the tongue every 5 (five) minutes as needed for chest pain (MAX 3 TABLETS). Patient not taking: Reported on 04/13/2024 04/28/21   Waddell Danelle ORN, MD    Physical Exam: Vitals:   04/13/24 2045 04/13/24 2235 04/13/24 2245 04/13/24 2300  BP: (!) 172/101  (!) 173/79 (!) 155/88  Pulse: 92 91 89 75  Resp: 16 16 20 15   Temp:   98.5 F (36.9 C)   TempSrc:      SpO2: 96% 99% 98% 98%  Weight:       Height:        Physical Exam Vitals reviewed.  Constitutional:      General: He is not in acute distress. HENT:     Head: Normocephalic and atraumatic.     Mouth/Throat:     Mouth: Mucous membranes are dry.  Eyes:     Extraocular Movements: Extraocular movements intact.  Cardiovascular:     Rate and Rhythm: Normal rate and regular rhythm.     Heart sounds: Normal heart sounds.  Pulmonary:     Effort: Pulmonary effort is normal. No respiratory distress.     Breath sounds: No stridor. No wheezing, rhonchi or rales.  Abdominal:     General: Bowel sounds are normal.     Palpations: Abdomen is soft.     Tenderness: There is no abdominal tenderness. There is no guarding.  Musculoskeletal:     Cervical back: Normal range of motion. No rigidity.     Right lower leg: Edema present.     Left lower leg: Edema present.     Comments: 2+ pitting edema of bilateral lower extremities  Skin:    General: Skin is warm and dry.  Neurological:     General: No focal deficit present.     Mental Status: He is alert and oriented to person, place, and time.     Cranial Nerves: No cranial nerve deficit.     Sensory: No sensory deficit.     Motor: No weakness.     Comments: He is able to follow commands     Labs on Admission: I have personally reviewed following labs and imaging studies  CBC: Recent Labs  Lab 04/13/24 1900  WBC 12.1*  NEUTROABS 11.3*  HGB 16.6  HCT 50.6  MCV 97.5  PLT 204   Basic Metabolic Panel: Recent Labs  Lab 04/13/24 1900  NA 139  K 4.4  CL 105  CO2 22  GLUCOSE 83  BUN 22  CREATININE 1.00  CALCIUM 9.0   GFR: Estimated Creatinine Clearance: 59.6 mL/min (by C-G formula based on SCr of 1  mg/dL). Liver Function Tests: Recent Labs  Lab 04/13/24 1900  AST 16  ALT 9  ALKPHOS 61  BILITOT 2.1*  PROT 7.1  ALBUMIN 3.4*   No results for input(s): LIPASE, AMYLASE in the last 168 hours. No results for input(s): AMMONIA in the last 168  hours. Coagulation Profile: Recent Labs  Lab 04/13/24 1900  INR 1.3*   Cardiac Enzymes: No results for input(s): CKTOTAL, CKMB, CKMBINDEX, TROPONINI in the last 168 hours. BNP (last 3 results) No results for input(s): PROBNP in the last 8760 hours. HbA1C: No results for input(s): HGBA1C in the last 72 hours. CBG: No results for input(s): GLUCAP in the last 168 hours. Lipid Profile: No results for input(s): CHOL, HDL, LDLCALC, TRIG, CHOLHDL, LDLDIRECT in the last 72 hours. Thyroid  Function Tests: No results for input(s): TSH, T4TOTAL, FREET4, T3FREE, THYROIDAB in the last 72 hours. Anemia Panel: No results for input(s): VITAMINB12, FOLATE, FERRITIN, TIBC, IRON, RETICCTPCT in the last 72 hours. Urine analysis:    Component Value Date/Time   COLORURINE YELLOW 04/13/2024 2007   APPEARANCEUR HAZY (A) 04/13/2024 2007   LABSPEC 1.024 04/13/2024 2007   PHURINE 5.0 04/13/2024 2007   GLUCOSEU NEGATIVE 04/13/2024 2007   HGBUR NEGATIVE 04/13/2024 2007   BILIRUBINUR NEGATIVE 04/13/2024 2007   KETONESUR 5 (A) 04/13/2024 2007   PROTEINUR NEGATIVE 04/13/2024 2007   UROBILINOGEN 0.2 11/12/2010 1949   NITRITE NEGATIVE 04/13/2024 2007   LEUKOCYTESUR NEGATIVE 04/13/2024 2007    Radiological Exams on Admission: CT ABDOMEN PELVIS W CONTRAST Result Date: 04/13/2024 EXAM: CT ABDOMEN AND PELVIS WITH CONTRAST 04/13/2024 09:03:14 PM TECHNIQUE: CT of the abdomen and pelvis was performed with the administration of 75 mL of iohexol  (OMNIPAQUE ) 350 MG/ML injection. Multiplanar reformatted images are provided for review. Automated exposure control, iterative reconstruction, and/or weight-based adjustment of the mA/kV was utilized to reduce the radiation dose to as low as reasonably achievable. COMPARISON: Comparison study 09/22/2023. CLINICAL HISTORY: generalized abd pain FINDINGS: LOWER CHEST: Lung bases are free of acute infiltrate or sizable effusion. No  parenchymal nodules are noted. LIVER: The liver is within normal limits. GALLBLADDER AND BILE DUCTS: The gallbladder is within normal limits. No biliary ductal dilatation. SPLEEN: The spleen is unremarkable. PANCREAS: The pancreas is unremarkable. ADRENAL GLANDS: The adrenal glands are unremarkable. KIDNEYS, URETERS AND BLADDER: The kidneys are well visualized bilaterally. No focal abnormality is noted. No obstructive changes are seen. The bladder is decompressed. No stones in the kidneys or ureters. GI AND BOWEL: A small hiatal hernia is seen. Stomach demonstrates no acute abnormality. Scattered diverticular changes of the colon are noted without inflammatory change. The small bowel is unremarkable. The appendix is air-filled and within normal limits. There is no bowel obstruction. PERITONEUM AND RETROPERITONEUM: No ascites. No free air. VASCULATURE: Aortic calcifications are seen without aneurysmal dilatation. LYMPH NODES: No lymphadenopathy. REPRODUCTIVE ORGANS: The prostate is enlarged in size but stable. BONES AND SOFT TISSUES: Gluteal lipoma is noted on the right stable from the prior exam. No acute osseous abnormality. No focal soft tissue abnormality. IMPRESSION: 1. Chronic changes as described above without acute abnormality. Electronically signed by: Oneil Devonshire MD 04/13/2024 09:29 PM EST RP Workstation: [id number]   CT Head Wo Contrast Result Date: 04/13/2024 EXAM: CT HEAD WITHOUT CONTRAST 04/13/2024 07:42:47 PM TECHNIQUE: CT of the head was performed without the administration of intravenous contrast. Automated exposure control, iterative reconstruction, and/or weight based adjustment of the mA/kV was utilized to reduce the radiation dose to as low as reasonably achievable. COMPARISON:  10/27/2023 CLINICAL HISTORY: acute delirium approx 3 pm today FINDINGS: BRAIN AND VENTRICLES: No acute hemorrhage. No evidence of acute infarct. No extra-axial collection. No mass effect or midline shift. ORBITS: No  acute abnormality. SINUSES: No acute abnormality. SOFT TISSUES AND SKULL: No acute soft tissue abnormality. No skull fracture. IMPRESSION: 1. No acute intracranial abnormality. Electronically signed by: Oneil Devonshire MD 04/13/2024 07:46 PM EST RP Workstation: [id number]   DG Chest Port 1 View Result Date: 04/13/2024 EXAM: 1 AP VIEW XRAY OF THE CHEST 04/13/2024 07:09:00 PM COMPARISON: 11/21/2023. CLINICAL HISTORY: Questionable sepsis - evaluate for abnormality FINDINGS: LUNGS AND PLEURA: Bibasilar atelectasis. No pleural effusion. No pneumothorax. HEART AND MEDIASTINUM: No acute abnormality of the cardiac and mediastinal silhouettes. BONES AND SOFT TISSUES: No acute osseous abnormality. IMPRESSION: 1. No active cardiopulmonary process identified. 2. Bibasilar atelectasis. Electronically signed by: Franky Crease MD 04/13/2024 07:13 PM EST RP Workstation: [id number]    EKG: Independently reviewed.  Rate controlled A-fib, no significant change compared to previous EKGs..  Assessment and Plan  Acute metabolic encephalopathy Dehydration could be contributing.  Wife states he has not been drinking water for the past few days.  He has dry mucous membranes on exam and UA with ketones.  Continue gentle IV fluid hydration.  UA not suggestive of infection but wife reporting patient having history of recurrent UTIs causing confusion.  Does have mild leukocytosis on labs.  Continue ceftriaxone  for now and follow-up urine culture.  Trend WBC count and follow-up blood cultures.  CT head showing no acute intracranial abnormality and no focal neurodeficit on exam.  No meningeal signs.  Check TSH, B12, and ammonia level.  Consider brain MRI if no clear etiology identified and not improving.  Generalized weakness PT/OT eval, fall precautions.  Parkinson's disease Continue carbidopa -levodopa .  Dementia Continue donezepil and memantine .  Chronic A-fib Currently rate controlled.  Continue apixaban , metoprolol , and  digoxin .  CAD Workup not suggestive of ACS.  Continue metoprolol .  Bilateral lower extremity pitting edema No evidence of pulmonary edema on imaging.  Last echo done in February 2019 showing EF 55 to 60%, grade 2 diastolic dysfunction, trivial aortic regurgitation, and mild mitral regurgitation.  Given gentle IV fluid hydration for now given concern for dehydration causing encephalopathy.  Follow-up BNP.  Hypertension SBP currently in the 160s.  Continue metoprolol .  History of stroke/TIA Continue apixaban .  He is not on a statin?  BPH Continue finasteride  and tamsulosin .  COPD Stable, no wheezing.  Continue home inhaler.  DVT prophylaxis: Apixaban  Code Status: Full Code (discussed with the patient's wife) Family Communication: Wife at bedside. Level of care: Telemetry bed Admission status: It is my clinical opinion that referral for OBSERVATION is reasonable and necessary in this patient based on the above information provided. The aforementioned taken together are felt to place the patient at high risk for further clinical deterioration. However, it is anticipated that the patient may be medically stable for discharge from the hospital within 24 to 48 hours.  Editha Ram MD [facility]  If 7PM-7AM, please contact night-coverage [email or web address]  04/13/2024, 11:35 PM

## 2024-04-13 NOTE — ED Provider Notes (Signed)
 [place] EMERGENCY DEPARTMENT AT [facility] Provider Note   CSN: [id number] Arrival date & time: 04/13/24  8180     Patient presents with: No chief complaint on file.   Erik [name] is a 83 y.o. male. Hx of atrial flutter, CAD, HLD, HTN, CVA, sinus node dysfunction, dementia, Parkinson's disease, COPD, atrial fibrillation currently on Eliquis  presenting with AMS since approx 3 pm today. Hx per EMS, pt wife, hx limited from pt 2/2 delirium.  Per report, approximately 3 PM today, patient was acting altered per patient wife.  Per EMS, patient wife stated that this is very similar to the last time he had a UTI, and she was concerned he started to develop 1.  Patient was not complaining of any concerns previously, however he generally does not.  Denies any fevers at home.  EMS called, patient altered, oriented x 2 but not able to answer any other questions.  Patient was actively moving all extremities, however his neck able to comply with a complete neurologic exam secondary to delirium.  Blood glucose 108.  {Add pertinent medical, surgical, social history, OB history to HPI:32947} HPI     Prior to Admission medications   Medication Sig Start Date End Date Taking? Authorizing Provider  traZODone  (DESYREL ) 50 MG tablet Take 1/2 tab po qhs 04/10/24   Wertman, Sara E, PA-C  acetaminophen  (TYLENOL ) 500 MG tablet Take 500-1,000 mg by mouth every 6 (six) hours as needed (pain).    [provider]  amLODipine  (NORVASC ) 5 MG tablet Take 1 tablet (5 mg total) by mouth daily as needed (for systolic blood pressure more than 160). 10/31/23   Tobie Yetta HERO, MD  carbidopa -levodopa  (SINEMET  IR) 25-100 MG tablet Take 1 tablet three times daily. 03/11/24   Georjean Darice HERO, MD  Carboxymethylcellulose Sodium (THERATEARS) 0.25 % SOLN Place 1 drop into both eyes See admin instructions. 2-4 times daily    [provider]  Cholecalciferol (VITAMIN D3) 50 MCG (2000 UT) TABS Take by  mouth.    [provider]  digoxin  (LANOXIN ) 0.125 MG tablet TAKE 1 TABLET BY MOUTH DAILY. EXCEPT ON SUNDAY 04/11/24   Waddell Danelle ORN, MD  donepezil  (ARICEPT ) 10 MG tablet Take 10 mg by mouth at bedtime. 07/04/18   [provider]  ELIQUIS  5 MG TABS tablet TAKE 1 TABLET BY MOUTH TWICE A DAY 03/21/18   Waddell Danelle ORN, MD  FIBER PO Take 1 capsule by mouth daily.    [provider]  finasteride  (PROSCAR ) 5 MG tablet Take 5 mg by mouth at bedtime.  08/08/17   [provider]  INCRUSE ELLIPTA  62.5 MCG/INH AEPB Take 1 puff by mouth daily. 01/29/17   [provider]  ipratropium (ATROVENT ) 0.03 % nasal spray Place 2 sprays into the nose 3 (three) times daily. 08/06/23   [provider]  memantine  (NAMENDA ) 10 MG tablet Take one tablet twice a day 09/28/23   Wertman, Sara E, PA-C  metoprolol  succinate (TOPROL -XL) 25 MG 24 hr tablet Take 1 tablet (25 mg total) by mouth daily. 11/09/23   Fenton, Clint R, PA  Multiple Vitamins-Minerals (PRESERVISION AREDS 2+MULTI VIT PO) Take 1 capsule by mouth in the morning and at bedtime.    [provider]  nitrofurantoin , macrocrystal-monohydrate, (MACROBID ) 100 MG capsule Take 100 mg by mouth daily. 11/21/23   [provider]  nitroGLYCERIN  (NITROSTAT ) 0.4 MG SL tablet Place 1 tablet (0.4 mg total) under the tongue every 5 (five) minutes as  needed for chest pain (MAX 3 TABLETS). 04/28/21   Waddell Danelle ORN, MD  polyethylene glycol (MIRALAX  / GLYCOLAX ) 17 g packet Take 17 g by mouth daily as needed for mild constipation. 06/15/20   Malvina Ellen, MD  tamsulosin  (FLOMAX ) 0.4 MG CAPS capsule Take 1 capsule (0.4 mg total) by mouth daily after breakfast. 06/15/20   Malvina Ellen, MD    Allergies: Albuterol  sulfate [albuterol ], Hyoscyamine sulfate, Adhesive [tape], and Warfarin and related    Review of Systems  Updated Vital Signs There were no vitals taken for this visit.  Physical Exam Vitals and  nursing note reviewed.  Constitutional:      General: He is in acute distress.     Appearance: He is ill-appearing.     Comments: Acutely delirious, actively staring off, oriented x 2, unable to answer any other questions.  HENT:     Head: Normocephalic and atraumatic.     Mouth/Throat:     Mouth: Mucous membranes are dry.     Pharynx: Oropharynx is clear.  Eyes:     Extraocular Movements: Extraocular movements intact.     Conjunctiva/sclera: Conjunctivae normal.     Pupils: Pupils are equal, round, and reactive to light.  Cardiovascular:     Rate and Rhythm: Normal rate and regular rhythm.     Pulses: Normal pulses.     Heart sounds: Normal heart sounds. No murmur heard.    No gallop.  Pulmonary:     Effort: Pulmonary effort is normal. No respiratory distress.     Breath sounds: Normal breath sounds. No stridor. No wheezing, rhonchi or rales.  Abdominal:     General: Abdomen is flat. There is no distension.     Palpations: Abdomen is soft.     Tenderness: There is no abdominal tenderness. There is no right CVA tenderness, left CVA tenderness, guarding or rebound.  Musculoskeletal:     Cervical back: Neck supple. No rigidity.     Right lower leg: Edema present.     Left lower leg: Edema present.     Comments: 3+ pitting edema bilateral lower extremities  Skin:    General: Skin is warm.     Capillary Refill: Capillary refill takes 2 to 3 seconds.  Neurological:     General: No focal deficit present.     Cranial Nerves: No cranial nerve deficit.     Motor: No weakness.     Comments: GCS of 2, acutely delirious, oriented to self and place, not oriented to time.  Unable to answer any other questions.  No appreciable facial droop, cranial nerves II through XII intact overall.  Actively moving all extremities without difficulty, equal strength bilateral upper and lower extremities.  No appreciable pronator drift.  Unable to participate in sensation and coordination evaluation.   Patient is too delirious to be able to evaluate gait.     (all labs ordered are listed, but only abnormal results are displayed) Labs Reviewed - No data to display  EKG: None  Radiology: No results found.  {Document cardiac monitor, telemetry assessment procedure when appropriate:32947} Procedures   Medications Ordered in the ED - No data to display    {Click here for ABCD2, HEART and other calculators REFRESH Note before signing:1}                              Medical Decision Making Amount and/or Complexity of Data Reviewed Labs: ordered. Radiology: ordered.  Risk Prescription drug management.   ***  {Document critical care time when appropriate  Document review of labs and clinical decision tools ie CHADS2VASC2, etc  Document your independent review of radiology images and any outside records  Document your discussion with family members, caretakers and with consultants  Document social determinants of health affecting pt's care  Document your decision making why or why not admission, treatments were needed:32947:::1}   Final diagnoses:  None    ED Discharge Orders     None

## 2024-04-13 NOTE — Sepsis Progress Note (Signed)
 Elink following code sepsis

## 2024-04-13 NOTE — ED Notes (Signed)
 Attempted to ambulate patient in hallway with walker. Patient able to stand at bedside with walker and take several small steps to doorway. Patient became progressively weak and unable to return to bed without significant assistance from this nurse. Provider notified.

## 2024-04-13 NOTE — ED Triage Notes (Signed)
 Pt family reports change in mentation at 3pm. Pt had negative stroke screen for ems. Pt is not following commands for ems. Pt coming in from home with wife. Pt has history of afib and is currently in afib. Pt wife says that this is normal behavior when pt has a uti.  Bp 164/92 Hr78 Spo2 98% ra  Rr 14  Cbg 108

## 2024-04-13 NOTE — ED Provider Notes (Incomplete)
 Patient is an 83 year old male with multiple medical problems including atrial fibrillation on Eliquis , Parkinson's disease on carbidopa  who is presenting today with delirium that has been getting worse all day per his wife.  By this evening he was unable to even get up out of the chair.  She does report that she had been sick with a viral illness but had tried staying away from him.  She had noticed he had a little bit of a cough but yesterday seem to complain of abdominal pain.  He has not eaten anything today but has taken his medications.  He has not had fever or seems short of breath.  He has not had any diarrhea.  Concern for infectious etiology versus acute abdominal process such as cholecystitis, colitis, hepatitis as he does appear to be diffusely tender on exam.  No findings to suggest stroke at this time.  Chest x-ray without evidence of pneumonia, EKG without acute findings and low suspicion for acute cardiac process.  Lactic acid is within normal limits but CBC with mild leukocytosis of 12.

## 2024-04-13 NOTE — ED Notes (Signed)
 Patient transported to CT

## 2024-04-13 NOTE — ED Notes (Signed)
 Patient's wife notified this nurse that she has already given patient all of his nighttime medications.

## 2024-04-14 ENCOUNTER — Other Ambulatory Visit: Payer: Self-pay

## 2024-04-14 DIAGNOSIS — G9341 Metabolic encephalopathy: Secondary | ICD-10-CM | POA: Diagnosis not present

## 2024-04-14 DIAGNOSIS — R531 Weakness: Secondary | ICD-10-CM

## 2024-04-14 LAB — CBC
HCT: 45.3 % (ref 39.0–52.0)
Hemoglobin: 15.1 g/dL (ref 13.0–17.0)
MCH: 31.8 pg (ref 26.0–34.0)
MCHC: 33.3 g/dL (ref 30.0–36.0)
MCV: 95.4 fL (ref 80.0–100.0)
Platelets: 212 K/uL (ref 150–400)
RBC: 4.75 MIL/uL (ref 4.22–5.81)
RDW: 13.2 % (ref 11.5–15.5)
WBC: 10.3 K/uL (ref 4.0–10.5)
nRBC: 0 % (ref 0.0–0.2)

## 2024-04-14 LAB — BRAIN NATRIURETIC PEPTIDE: B Natriuretic Peptide: 316.8 pg/mL — ABNORMAL HIGH (ref 0.0–100.0)

## 2024-04-14 LAB — TSH: TSH: 0.609 u[IU]/mL (ref 0.350–4.500)

## 2024-04-14 LAB — VITAMIN B12: Vitamin B-12: 492 pg/mL (ref 180–914)

## 2024-04-14 LAB — AMMONIA: Ammonia: 38 umol/L — ABNORMAL HIGH (ref 9–35)

## 2024-04-14 MED ORDER — MEMANTINE HCL 10 MG PO TABS
10.0000 mg | ORAL_TABLET | Freq: Two times a day (BID) | ORAL | Status: DC
  Administered 2024-04-14 – 2024-04-19 (×11): 10 mg via ORAL
  Filled 2024-04-14 (×11): qty 1

## 2024-04-14 MED ORDER — APIXABAN 5 MG PO TABS
5.0000 mg | ORAL_TABLET | Freq: Two times a day (BID) | ORAL | Status: DC
  Administered 2024-04-14 – 2024-04-19 (×11): 5 mg via ORAL
  Filled 2024-04-14 (×11): qty 1

## 2024-04-14 MED ORDER — SODIUM CHLORIDE 0.9 % IV SOLN: INTRAVENOUS | Status: AC

## 2024-04-14 MED ORDER — DIGOXIN 125 MCG PO TABS
0.1250 mg | ORAL_TABLET | Freq: Every day | ORAL | Status: DC
  Administered 2024-04-14 – 2024-04-19 (×6): 0.125 mg via ORAL
  Filled 2024-04-14 (×6): qty 1

## 2024-04-14 MED ORDER — IPRATROPIUM BROMIDE 0.06 % NA SOLN
2.0000 | Freq: Three times a day (TID) | NASAL | Status: DC
  Administered 2024-04-14 – 2024-04-19 (×14): 2 via NASAL
  Filled 2024-04-14 (×2): qty 15

## 2024-04-14 MED ORDER — ACETAMINOPHEN 325 MG PO TABS
650.0000 mg | ORAL_TABLET | Freq: Four times a day (QID) | ORAL | Status: DC | PRN
  Administered 2024-04-16 – 2024-04-18 (×4): 650 mg via ORAL
  Filled 2024-04-14 (×4): qty 2

## 2024-04-14 MED ORDER — ACETAMINOPHEN 650 MG RE SUPP: 650.0000 mg | Freq: Four times a day (QID) | RECTAL | Status: DC | PRN

## 2024-04-14 MED ORDER — CARBIDOPA-LEVODOPA 25-100 MG PO TABS
1.0000 | ORAL_TABLET | Freq: Three times a day (TID) | ORAL | Status: DC
  Administered 2024-04-14 – 2024-04-19 (×16): 1 via ORAL
  Filled 2024-04-14 (×17): qty 1

## 2024-04-14 MED ORDER — METOPROLOL SUCCINATE ER 25 MG PO TB24
25.0000 mg | ORAL_TABLET | Freq: Every day | ORAL | Status: DC
  Administered 2024-04-14 – 2024-04-18 (×5): 25 mg via ORAL
  Filled 2024-04-14 (×5): qty 1

## 2024-04-14 MED ORDER — SODIUM CHLORIDE 0.9 % IV SOLN
2.0000 g | INTRAVENOUS | Status: DC
  Administered 2024-04-14 – 2024-04-15 (×2): 2 g via INTRAVENOUS
  Filled 2024-04-14 (×2): qty 20

## 2024-04-14 MED ORDER — TAMSULOSIN HCL 0.4 MG PO CAPS
0.4000 mg | ORAL_CAPSULE | Freq: Every day | ORAL | Status: DC
  Administered 2024-04-14 – 2024-04-19 (×6): 0.4 mg via ORAL
  Filled 2024-04-14 (×6): qty 1

## 2024-04-14 MED ORDER — FINASTERIDE 5 MG PO TABS
5.0000 mg | ORAL_TABLET | Freq: Every day | ORAL | Status: DC
  Administered 2024-04-14 – 2024-04-18 (×5): 5 mg via ORAL
  Filled 2024-04-14 (×5): qty 1

## 2024-04-14 MED ORDER — DONEPEZIL HCL 10 MG PO TABS
10.0000 mg | ORAL_TABLET | Freq: Every day | ORAL | Status: DC
  Administered 2024-04-14 – 2024-04-18 (×5): 10 mg via ORAL
  Filled 2024-04-14 (×5): qty 1

## 2024-04-14 MED ORDER — UMECLIDINIUM BROMIDE 62.5 MCG/ACT IN AEPB
1.0000 | INHALATION_SPRAY | Freq: Every day | RESPIRATORY_TRACT | Status: DC
  Administered 2024-04-15 – 2024-04-19 (×5): 1 via RESPIRATORY_TRACT
  Filled 2024-04-14 (×2): qty 7

## 2024-04-14 MED ORDER — LACTATED RINGERS IV SOLN: INTRAVENOUS | Status: DC

## 2024-04-14 NOTE — ED Notes (Signed)
 Pt cleaned up of urinary and bowel incontinence. Chux changed, clean brief applied. Condom cath placed on patient. Pt max assist to turn in bed. Spouse reports significant generalized weakness compared to baseline.

## 2024-04-14 NOTE — ED Notes (Signed)
 Nt called CCMD@7 :34am

## 2024-04-14 NOTE — Progress Notes (Signed)
 PROGRESS NOTE  Erik [name] FMW:[id number] DOB: 10/03/1940 DOA: 04/13/2024 PCP: Leonel Cole, MD   LOS: 0 days   Brief Narrative / Interim history: 83 year old male with Parkinson's disease, dementia, chronic A-fib/flutter on Eliquis , CAD, HTN, HLD, prior stroke, COPD who comes into the hospital with altered mental status.  Wife tells me that he has been increasingly confused, irritable, signs that have been associated with prior UTIs and improved with antibiotics.  Subjective / 24h Interval events: Patient remains quite confused on my evaluation, unreliable historian.  Wife is at bedside and tells me he is still confused and weaker than his baseline.  Assesement and Plan: Principal problem Metabolic encephalopathy, dehydration-he has not been eating and drinking well for the last several days, has been having very poor balance and increased confusion.  Continue IV fluids, IV antibiotics - Therapy evaluation pending - Closely monitor mental status, delirium precautions.  Obtain MRI of the brain if no improvement with antibiotics  Active problems UTI-borderline, has some bacteria in the urine but no significant pyuria.  Will monitor cultures  Parkinson's disease-continue home medications  Dementia-continue home medications  Chronic A-fib-rate controlled, continue Eliquis , metoprolol , digoxin   BPH-continue home medications  History of stroke-on Eliquis   Essential hypertension-continue home medications  COPD-no wheezing, respiratory status appears at baseline  Generalized weakness-PT eval pending  Scheduled Meds:  apixaban   5 mg Oral BID   carbidopa -levodopa   1 tablet Oral TID   [START ON 04/15/2024] digoxin   0.125 mg Oral Daily   donepezil   10 mg Oral QHS   finasteride   5 mg Oral QHS   ipratropium  2 spray Nasal TID   memantine   10 mg Oral BID   metoprolol  succinate  25 mg Oral QHS   tamsulosin   0.4 mg Oral QPC breakfast   umeclidinium bromide   1 puff Inhalation Daily    Continuous Infusions:  sodium chloride  75 mL/hr at 04/14/24 0415   cefTRIAXone  (ROCEPHIN )  IV     PRN Meds:.acetaminophen  **OR** acetaminophen   Current Outpatient Medications  Medication Instructions   acetaminophen  (TYLENOL ) 500-1,000 mg, Every 6 hours PRN   amLODipine  (NORVASC ) 5 mg, Oral, Daily PRN   carbidopa -levodopa  (SINEMET  IR) 25-100 MG tablet Take 1 tablet three times daily.   Carboxymethylcellulose Sodium (THERATEARS) 0.25 % SOLN 1 drop, See admin instructions   digoxin  (LANOXIN ) 0.125 MG tablet TAKE 1 TABLET BY MOUTH DAILY. EXCEPT ON SUNDAY   donepezil  (ARICEPT ) 10 mg, Daily at bedtime   ELIQUIS  5 MG TABS tablet TAKE 1 TABLET BY MOUTH TWICE A DAY   FIBER PO 1 capsule, Daily   finasteride  (PROSCAR ) 5 mg, Daily at bedtime   INCRUSE ELLIPTA  62.5 MCG/INH AEPB 1 puff, Daily   ipratropium (ATROVENT ) 0.03 % nasal spray 2 sprays, 3 times daily   memantine  (NAMENDA ) 10 MG tablet Take one tablet twice a day   metoprolol  succinate (TOPROL -XL) 25 mg, Oral, Daily   Multiple Vitamins-Minerals (PRESERVISION AREDS 2+MULTI VIT PO) 1 capsule, 2 times daily   nitrofurantoin  (macrocrystal-monohydrate) (MACROBID ) 100 mg, Daily   nitroGLYCERIN  (NITROSTAT ) 0.4 mg, Sublingual, Every 5 min PRN   polyethylene glycol (MIRALAX  / GLYCOLAX ) 17 g, Oral, Daily PRN   tamsulosin  (FLOMAX ) 0.4 mg, Oral, Daily after breakfast   traZODone  (DESYREL ) 50 MG tablet Take 1/2 tab po qhs   Vitamin D3 2,000 Units, Every morning    Diet Orders (From admission, onward)     Start     Ordered   04/14/24 0046  Diet Heart Room service appropriate? Yes;  Fluid consistency: Thin  Diet effective now       Question Answer Comment  Room service appropriate? Yes   Fluid consistency: Thin      04/14/24 0047            DVT prophylaxis:  apixaban  (ELIQUIS ) tablet 5 mg   Lab Results  Component Value Date   PLT 212 04/14/2024      Code Status: Full Code  Family Communication: Wife at bedside  Status is:  Observation The patient will require care spanning > 2 midnights and should be moved to inpatient because: Confused, IV antibiotics   Level of care: Telemetry  Consultants:  None  Objective: Vitals:   04/14/24 0500 04/14/24 0600 04/14/24 0700 04/14/24 0747  BP: 133/84 (!) 145/78 (!) 148/68 (!) 157/85  Pulse: 84 69 (!) 157 77  Resp:   18 15  Temp:    98 F (36.7 C)  TempSrc:    Oral  SpO2: 97% 98% 99% 99%  Weight:      Height:        Intake/Output Summary (Last 24 hours) at 04/14/2024 0911 Last data filed at 04/13/2024 2045 Gross per 24 hour  Intake 1100 ml  Output --  Net 1100 ml   Wt Readings from Last 3 Encounters:  04/13/24 77 kg  03/10/24 77.1 kg  11/30/23 96 kg    Examination:  Constitutional: NAD Eyes: no scleral icterus ENMT: Mucous membranes are moist.  Neck: normal, supple Respiratory: clear to auscultation bilaterally, no wheezing, no crackles.  Cardiovascular: Regular rate and rhythm, no murmurs / rubs / gallops. No LE edema.  Abdomen: non distended, no tenderness. Bowel sounds positive.  Musculoskeletal: no clubbing / cyanosis.   Data Reviewed: I have independently reviewed following labs and imaging studies   CBC Recent Labs  Lab 04/13/24 1900 04/14/24 0200  WBC 12.1* 10.3  HGB 16.6 15.1  HCT 50.6 45.3  PLT 204 212  MCV 97.5 95.4  MCH 32.0 31.8  MCHC 32.8 33.3  RDW 13.1 13.2  LYMPHSABS 0.3*  --   MONOABS 0.4  --   EOSABS 0.0  --   BASOSABS 0.0  --     Recent Labs  Lab 04/13/24 1900 04/13/24 1906 04/14/24 0200  NA 139  --   --   K 4.4  --   --   CL 105  --   --   CO2 22  --   --   GLUCOSE 83  --   --   BUN 22  --   --   CREATININE 1.00  --   --   CALCIUM 9.0  --   --   AST 16  --   --   ALT 9  --   --   ALKPHOS 61  --   --   BILITOT 2.1*  --   --   ALBUMIN 3.4*  --   --   LATICACIDVEN  --  1.7  --   INR 1.3*  --   --   TSH  --   --  0.609  AMMONIA  --   --  38*     ------------------------------------------------------------------------------------------------------------------ No results for input(s): CHOL, HDL, LDLCALC, TRIG, CHOLHDL, LDLDIRECT in the last 72 hours.  Lab Results  Component Value Date   HGBA1C 5.8 (H) 11/13/2010   ------------------------------------------------------------------------------------------------------------------ Recent Labs    04/14/24 0200  TSH 0.609    Cardiac Enzymes No results for input(s): CKMB, TROPONINI, MYOGLOBIN in the last  168 hours.  Invalid input(s): CK ------------------------------------------------------------------------------------------------------------------ No results found for: BNP  CBG: No results for input(s): GLUCAP in the last 168 hours.  Recent Results (from the past 240 hours)  Blood Culture (routine x 2)     Status: None (Preliminary result)   Collection Time: 04/13/24  6:43 PM   Specimen: BLOOD  Result Value Ref Range Status   Specimen Description BLOOD SITE NOT SPECIFIED  Final   Special Requests   Final    BOTTLES DRAWN AEROBIC AND ANAEROBIC Blood Culture results may not be optimal due to an inadequate volume of blood received in culture bottles   Culture   Final    NO GROWTH < 12 HOURS Performed at [facility], [address], [place], [place] [address]    Report Status PENDING  Incomplete  Blood Culture (routine x 2)     Status: None (Preliminary result)   Collection Time: 04/13/24  8:47 PM   Specimen: BLOOD  Result Value Ref Range Status   Specimen Description BLOOD SITE NOT SPECIFIED  Final   Special Requests   Final    BOTTLES DRAWN AEROBIC ONLY Blood Culture results may not be optimal due to an inadequate volume of blood received in culture bottles   Culture   Final    NO GROWTH < 12 HOURS Performed at [facility], [address], [place], [place] [address]    Report Status PENDING  Incomplete  Resp panel by RT-PCR  (RSV, Flu A&B, Covid) Anterior Nasal Swab     Status: None   Collection Time: 04/13/24  8:54 PM   Specimen: Anterior Nasal Swab  Result Value Ref Range Status   SARS Coronavirus 2 by RT PCR NEGATIVE NEGATIVE Final   Influenza A by PCR NEGATIVE NEGATIVE Final   Influenza B by PCR NEGATIVE NEGATIVE Final    Comment: (NOTE) The Xpert Xpress SARS-CoV-2/FLU/RSV plus assay is intended as an aid in the diagnosis of influenza from Nasopharyngeal swab specimens and should not be used as a sole basis for treatment. Nasal washings and aspirates are unacceptable for Xpert Xpress SARS-CoV-2/FLU/RSV testing.  Fact Sheet for Patients: bloggercourse.com  Fact Sheet for Healthcare Providers: seriousbroker.it  This test is not yet approved or cleared by the [place]  FDA and has been authorized for detection and/or diagnosis of SARS-CoV-2 by FDA under an Emergency Use Authorization (EUA). This EUA will remain in effect (meaning this test can be used) for the duration of the COVID-19 declaration under Section 564(b)(1) of the Act, 21 U.S.C. section 360bbb-3(b)(1), unless the authorization is terminated or revoked.     Resp Syncytial Virus by PCR NEGATIVE NEGATIVE Final    Comment: (NOTE) Fact Sheet for Patients: bloggercourse.com  Fact Sheet for Healthcare Providers: seriousbroker.it  This test is not yet approved or cleared by the [place]  FDA and has been authorized for detection and/or diagnosis of SARS-CoV-2 by FDA under an Emergency Use Authorization (EUA). This EUA will remain in effect (meaning this test can be used) for the duration of the COVID-19 declaration under Section 564(b)(1) of the Act, 21 U.S.C. section 360bbb-3(b)(1), unless the authorization is terminated or revoked.  Performed at [facility], [address] [place], [place] [address]      Radiology  Studies: CT ABDOMEN PELVIS W CONTRAST Result Date: 04/13/2024 EXAM: CT ABDOMEN AND PELVIS WITH CONTRAST 04/13/2024 09:03:14 PM TECHNIQUE: CT of the abdomen and pelvis was performed with the administration of 75 mL of  iohexol  (OMNIPAQUE ) 350 MG/ML injection. Multiplanar reformatted images are provided for review. Automated exposure control, iterative reconstruction, and/or weight-based adjustment of the mA/kV was utilized to reduce the radiation dose to as low as reasonably achievable. COMPARISON: Comparison study 09/22/2023. CLINICAL HISTORY: generalized abd pain FINDINGS: LOWER CHEST: Lung bases are free of acute infiltrate or sizable effusion. No parenchymal nodules are noted. LIVER: The liver is within normal limits. GALLBLADDER AND BILE DUCTS: The gallbladder is within normal limits. No biliary ductal dilatation. SPLEEN: The spleen is unremarkable. PANCREAS: The pancreas is unremarkable. ADRENAL GLANDS: The adrenal glands are unremarkable. KIDNEYS, URETERS AND BLADDER: The kidneys are well visualized bilaterally. No focal abnormality is noted. No obstructive changes are seen. The bladder is decompressed. No stones in the kidneys or ureters. GI AND BOWEL: A small hiatal hernia is seen. Stomach demonstrates no acute abnormality. Scattered diverticular changes of the colon are noted without inflammatory change. The small bowel is unremarkable. The appendix is air-filled and within normal limits. There is no bowel obstruction. PERITONEUM AND RETROPERITONEUM: No ascites. No free air. VASCULATURE: Aortic calcifications are seen without aneurysmal dilatation. LYMPH NODES: No lymphadenopathy. REPRODUCTIVE ORGANS: The prostate is enlarged in size but stable. BONES AND SOFT TISSUES: Gluteal lipoma is noted on the right stable from the prior exam. No acute osseous abnormality. No focal soft tissue abnormality. IMPRESSION: 1. Chronic changes as described above without acute abnormality. Electronically signed by: Oneil Devonshire MD 04/13/2024 09:29 PM EST RP Workstation: [id number]   CT Head Wo Contrast Result Date: 04/13/2024 EXAM: CT HEAD WITHOUT CONTRAST 04/13/2024 07:42:47 PM TECHNIQUE: CT of the head was performed without the administration of intravenous contrast. Automated exposure control, iterative reconstruction, and/or weight based adjustment of the mA/kV was utilized to reduce the radiation dose to as low as reasonably achievable. COMPARISON: 10/27/2023 CLINICAL HISTORY: acute delirium approx 3 pm today FINDINGS: BRAIN AND VENTRICLES: No acute hemorrhage. No evidence of acute infarct. No extra-axial collection. No mass effect or midline shift. ORBITS: No acute abnormality. SINUSES: No acute abnormality. SOFT TISSUES AND SKULL: No acute soft tissue abnormality. No skull fracture. IMPRESSION: 1. No acute intracranial abnormality. Electronically signed by: Oneil Devonshire MD 04/13/2024 07:46 PM EST RP Workstation: [id number]   DG Chest Port 1 View Result Date: 04/13/2024 EXAM: 1 AP VIEW XRAY OF THE CHEST 04/13/2024 07:09:00 PM COMPARISON: 11/21/2023. CLINICAL HISTORY: Questionable sepsis - evaluate for abnormality FINDINGS: LUNGS AND PLEURA: Bibasilar atelectasis. No pleural effusion. No pneumothorax. HEART AND MEDIASTINUM: No acute abnormality of the cardiac and mediastinal silhouettes. BONES AND SOFT TISSUES: No acute osseous abnormality. IMPRESSION: 1. No active cardiopulmonary process identified. 2. Bibasilar atelectasis. Electronically signed by: Franky Crease MD 04/13/2024 07:13 PM EST RP Workstation: [id number]     Nilda Fendt, MD, PhD [facility]  Between 7 am - 7 pm I am available, please contact me via Amion (for emergencies) or Securechat (non urgent messages)  Between 7 pm - 7 am I am not available, please contact night coverage MD/APP via Amion

## 2024-04-14 NOTE — Evaluation (Signed)
 Physical Therapy Evaluation Patient Details Name: Erik [name] MRN: [id number] DOB: 1940/09/15 Today's Date: 04/14/2024  History of Present Illness  Pt is 83 year old presented to [facility] on  04/13/24 for metabolic encephalopathy and dehydration. Possible UTI. PMH - Parkinsons, dementia, CVA, HTN, CAD, a fib, BPH, COPD, TIA, bradycardia, alcohol  use, recurrent UTIs, chronic back pain  Clinical Impression  Pt admitted with above diagnosis and presents to PT with functional limitations due to deficits listed below (See PT problem list). Pt needs skilled PT to maximize independence and safety. Pt from home with wife and with personal care attendant for 2 hrs/day. Currently unable to amb with knees buckling. Wife unable to provide the level of physical assistance he currently needs. Patient will benefit from continued inpatient follow up therapy, <3 hours/day.           If plan is discharge home, recommend the following: Two people to help with walking and/or transfers;A lot of help with bathing/dressing/bathroom;Assist for transportation   Can travel by private vehicle   No    Equipment Recommendations Wheelchair (measurements PT);Wheelchair cushion (measurements PT)  Recommendations for Other Services       Functional Status Assessment Patient has had a recent decline in their functional status and demonstrates the ability to make significant improvements in function in a reasonable and predictable amount of time.     Precautions / Restrictions Precautions Precautions: Fall Restrictions Weight Bearing Restrictions Per Provider Order: No      Mobility  Bed Mobility Overal bed mobility: Needs Assistance Bed Mobility: Supine to Sit, Sit to Supine     Supine to sit: Mod assist, HOB elevated Sit to supine: Mod assist, Used rails   General bed mobility comments: Assist to elevate trunk and bring hips to EOB. Assist to lower trunk and bring legs back up into bed     Transfers Overall transfer level: Needs assistance Equipment used: Rolling walker (2 wheels) Transfers: Sit to/from Stand, Bed to chair/wheelchair/BSC Sit to Stand: Mod assist, From elevated surface, +2 safety/equipment   Step pivot transfers: Mod assist, +2 physical assistance       General transfer comment: Assist to power up and support.    Ambulation/Gait Ambulation/Gait assistance: +2 physical assistance, Mod assist Gait Distance (Feet): 2 Feet Assistive device: Rolling walker (2 wheels) Gait Pattern/deviations: Step-to pattern, Decreased step length - right, Decreased step length - left, Knees buckling, Shuffle, Trunk flexed Gait velocity: decr Gait velocity interpretation: <1.31 ft/sec, indicative of household ambulator   General Gait Details: Attempted to amb in room. Pt only able to go 2-3' with knees beginning to buckle. Brought seat for pt.  Stairs            Wheelchair Mobility     Tilt Bed    Modified Rankin (Stroke Patients Only)       Balance Overall balance assessment: Needs assistance Sitting-balance support: Feet supported Sitting balance-Leahy Scale: Fair     Standing balance support: Bilateral upper extremity supported, Reliant on assistive device for balance Standing balance-Leahy Scale: Poor Standing balance comment: walker and mod assist for static standing                             Pertinent Vitals/Pain Pain Assessment Pain Assessment: Faces Faces Pain Scale: Hurts little more Pain Location: generalized Pain Descriptors / Indicators: Grimacing, Discomfort Pain Intervention(s): Limited activity within patient's tolerance, Repositioned    Home Living Family/patient expects to be  discharged to:: Private residence Living Arrangements: Spouse/significant other Available Help at Discharge: Family;Available 24 hours/day;Personal care attendant Type of Home: House Home Access: Stairs to enter Entrance Stairs-Rails:  Right;Left;Can reach both Entrance Stairs-Number of Steps: 5   Home Layout: Two level;Able to live on main level with bedroom/bathroom Home Equipment: Shower seat;Cane - single point;Rollator (4 wheels);Rolling Walker (2 wheels);BSC/3in1;Toilet riser Additional Comments: PCA 2 hrs/day 5 days/wk    Prior Function Prior Level of Function : Needs assist       Physical Assist : ADLs (physical) (has been using a RW adn able to walk short distances on his own; has assistance when he goes outside)   ADLs (physical): Bathing;Dressing;Toileting;IADLs Fredna (wife) assists with bathing/dressing; has been assisting with self feeding since being sick) Mobility Comments: Uses RW inside and rollator out of home. With rollator occasionally needs assist to get around (brake management)       Extremity/Trunk Assessment   Upper Extremity Assessment Upper Extremity Assessment: Defer to OT evaluation    Lower Extremity Assessment Lower Extremity Assessment: Generalized weakness    Cervical / Trunk Assessment Cervical / Trunk Assessment: Kyphotic  Communication   Communication Communication: Impaired Factors Affecting Communication: Difficulty expressing self;Reduced clarity of speech    Cognition Arousal: Lethargic Behavior During Therapy: WFL for tasks assessed/performed   PT - Cognitive impairments: Orientation, Memory, Attention, Problem solving, Initiation, Safety/Judgement, Awareness, History of cognitive impairments   Orientation impairments: Time, Situation                   PT - Cognition Comments: Pt more impaired than baseline Following commands: Impaired Following commands impaired: Follows one step commands inconsistently     Cueing Cueing Techniques: Verbal cues, Gestural cues, Tactile cues, Visual cues     General Comments General comments (skin integrity, edema, etc.): VSS on RA    Exercises     Assessment/Plan    PT Assessment Patient needs continued PT  services  PT Problem List Decreased strength;Decreased activity tolerance;Decreased balance;Decreased mobility;Decreased cognition       PT Treatment Interventions DME instruction;Gait training;Functional mobility training;Therapeutic activities;Therapeutic exercise;Balance training;Patient/family education;Cognitive remediation    PT Goals (Current goals can be found in the Care Plan section)  Acute Rehab PT Goals Patient Stated Goal: not stated PT Goal Formulation: With patient/family Time For Goal Achievement: 04/28/24 Potential to Achieve Goals: Good    Frequency Min 2X/week     Co-evaluation PT/OT/SLP Co-Evaluation/Treatment: Yes Reason for Co-Treatment: For patient/therapist safety;To address functional/ADL transfers PT goals addressed during session: Mobility/safety with mobility;Balance OT goals addressed during session: ADL's and self-care       AM-PAC PT 6 Clicks Mobility  Outcome Measure Help needed turning from your back to your side while in a flat bed without using bedrails?: A Lot Help needed moving from lying on your back to sitting on the side of a flat bed without using bedrails?: A Lot Help needed moving to and from a bed to a chair (including a wheelchair)?: Total Help needed standing up from a chair using your arms (e.g., wheelchair or bedside chair)?: Total Help needed to walk in hospital room?: Total Help needed climbing 3-5 steps with a railing? : Total 6 Click Score: 8    End of Session Equipment Utilized During Treatment: Gait belt Activity Tolerance: Patient tolerated treatment well Patient left: in bed;with call bell/phone within reach;with family/visitor present   PT Visit Diagnosis: Unsteadiness on feet (R26.81);Other abnormalities of gait and mobility (R26.89);Muscle weakness (generalized) (M62.81)  Time: 8970-8951 PT Time Calculation (min) (ACUTE ONLY): 19 min   Charges:   PT Evaluation $PT Eval Moderate Complexity: 1 Mod   PT  General Charges $$ ACUTE PT VISIT: 1 Visit         [facility] PT Acute Rehabilitation Services Office [phone]   [place]gers ORN [facility] 04/14/2024, 12:17 PM

## 2024-04-14 NOTE — Telephone Encounter (Signed)
 Trazodone  can cause Qt interval prolongation, arrhthymias such as ventricular fibrillation and tachycardia. Would use with caution.

## 2024-04-14 NOTE — Evaluation (Signed)
 Occupational Therapy Evaluation Patient Details Name: Erik [name] MRN: [id number] DOB: 12/02/1940 Today's Date: 04/14/2024   History of Present Illness   Pt is 83 year old presented to [facility] on  04/13/24 for metabolic encephalopathy and dehydration. Possible UTI. PMH - Parkinsons, dementia, CVA, HTN, CAD, a fib, BPH, COPD, TIA, bradycardia, alcohol  use, recurrent UTIs, chronic back pain     Clinical Impressions PTA pt lives at home with his wife and has a PCA 2/hrs/day 5 days /wk. At baseline, pt ambulates with a RW in the home, rollator in the community and has requires assistance for bathing/dressing and recently toileting. Pt demonstrates a functional decline, requiring Mod A +2 @ RW level for short distance mobility and overall Max to total A for all ADL tasks. Patient will benefit from continued inpatient follow up therapy, <3 hours/day to maximize functional level of independence with goal to return home with wife. Acute OT to follow.      If plan is discharge home, recommend the following:   Two people to help with walking and/or transfers;A lot of help with bathing/dressing/bathroom;Assistance with feeding;Direct supervision/assist for medications management;Direct supervision/assist for financial management;Assist for transportation;Help with stairs or ramp for entrance     Functional Status Assessment   Patient has had a recent decline in their functional status and demonstrates the ability to make significant improvements in function in a reasonable and predictable amount of time.     Equipment Recommendations   None recommended by OT     Recommendations for Other Services         Precautions/Restrictions   Precautions Precautions: Fall     Mobility Bed Mobility Overal bed mobility: Needs Assistance Bed Mobility: Supine to Sit, Sit to Supine     Supine to sit: Mod assist, HOB elevated Sit to supine: Mod assist, Used rails         Transfers Overall transfer level: Needs assistance Equipment used: Rolling walker (2 wheels) Transfers: Sit to/from Stand, Bed to chair/wheelchair/BSC Sit to Stand: Mod assist, From elevated surface     Step pivot transfers: Mod assist, +2 physical assistance     General transfer comment: knees buckling; multimodal cues      Balance Overall balance assessment: Needs assistance Sitting-balance support: Feet supported Sitting balance-Leahy Scale: Fair       Standing balance-Leahy Scale: Poor                             ADL either performed or assessed with clinical judgement   ADL Overall ADL's : Needs assistance/impaired Eating/Feeding: Minimal assistance   Grooming: Minimal assistance;Sitting   Upper Body Bathing: Maximal assistance;Sitting   Lower Body Bathing: Maximal assistance;Sit to/from stand   Upper Body Dressing : Maximal assistance;Sitting   Lower Body Dressing: Total assistance;Sit to/from stand   Toilet Transfer: Moderate assistance;+2 for physical assistance;Rolling walker (2 wheels)   Toileting- Clothing Manipulation and Hygiene: Total assistance (incontinent)       Functional mobility during ADLs: Moderate assistance;+2 for safety/equipment;Rolling walker (2 wheels);Cueing for safety;Cueing for sequencing       Vision Baseline Vision/History: 0 No visual deficits (question visual halluciantions at times)       Perception         Praxis         Pertinent Vitals/Pain Pain Assessment Pain Assessment: Faces Faces Pain Scale: Hurts little more Pain Location: generalized Pain Descriptors / Indicators: Grimacing, Discomfort Pain Intervention(s): Limited activity within  patient's tolerance     Extremity/Trunk Assessment Upper Extremity Assessment Upper Extremity Assessment: Generalized weakness   Lower Extremity Assessment Lower Extremity Assessment: Defer to PT evaluation   Cervical / Trunk Assessment Cervical / Trunk  Assessment: Kyphotic   Communication Communication Communication: Impaired Factors Affecting Communication: Difficulty expressing self;Reduced clarity of speech   Cognition Arousal: Lethargic Behavior During Therapy: WFL for tasks assessed/performed Cognition: Cognition impaired   Orientation impairments: Time, Situation Awareness: Intellectual awareness impaired, Online awareness impaired Memory impairment (select all impairments): Short-term memory, Working civil service fast streamer, Engineer, structural memory Attention impairment (select first level of impairment): Sustained attention Executive functioning impairment (select all impairments): Initiation, Organization, Sequencing, Reasoning, Problem solving OT - Cognition Comments: more impaired than normal                 Following commands: Impaired Following commands impaired: Follows one step commands inconsistently     Cueing  General Comments   Cueing Techniques: Verbal cues;Gestural cues;Tactile cues;Visual cues      Exercises     Shoulder Instructions      Home Living Family/patient expects to be discharged to:: Private residence Living Arrangements: Spouse/significant other Available Help at Discharge: Family;Available 24 hours/day;Personal care attendant Type of Home: House Home Access: Stairs to enter [employer] of Steps: 5 Entrance Stairs-Rails: Right;Left;Can reach both Home Layout: Two level;Able to live on main level with bedroom/bathroom     Bathroom Shower/Tub: Chief Strategy Officer: Handicapped height Bathroom Accessibility: Yes How Accessible: Accessible via walker Home Equipment: Shower seat;Cane - single point;Rollator (4 wheels);Rolling Walker (2 wheels);BSC/3in1;Toilet riser   Additional Comments: PCA 2 hrs/day 5 days/wk      Prior Functioning/Environment Prior Level of Function : Needs assist       Physical Assist : ADLs (physical) (has been using a RW adn able to walk  short distances on his own; has assistance when he goes outside)   ADLs (physical): Bathing;Dressing;Toileting;IADLs Fredna (wife) assists with bathing/dressing; has been assisting with self feeding since being sick) Mobility Comments: With rollator occasionally needs assist to get around (brake management)      OT Problem List: Decreased strength;Decreased activity tolerance;Impaired balance (sitting and/or standing);Decreased cognition;Decreased safety awareness;Decreased knowledge of precautions   OT Treatment/Interventions: Self-care/ADL training;Therapeutic exercise;DME and/or AE instruction;Therapeutic activities;Cognitive remediation/compensation;Patient/family education;Balance training      OT Goals(Current goals can be found in the care plan section)   Acute Rehab OT Goals Patient Stated Goal: wife OT Goal Formulation: With patient/family Time For Goal Achievement: 04/28/24 Potential to Achieve Goals: Good   OT Frequency:  Min 2X/week    Co-evaluation PT/OT/SLP Co-Evaluation/Treatment: Yes Reason for Co-Treatment: For patient/therapist safety;To address functional/ADL transfers   OT goals addressed during session: ADL's and self-care      AM-PAC OT 6 Clicks Daily Activity     Outcome Measure Help from another person eating meals?: A Little Help from another person taking care of personal grooming?: A Little Help from another person toileting, which includes using toliet, bedpan, or urinal?: Total Help from another person bathing (including washing, rinsing, drying)?: A Lot Help from another person to put on and taking off regular upper body clothing?: A Lot Help from another person to put on and taking off regular lower body clothing?: Total 6 Click Score: 12   End of Session Equipment Utilized During Treatment: Gait belt;Rolling walker (2 wheels) Nurse Communication: Mobility status  Activity Tolerance: Patient tolerated treatment well Patient left: Other  (comment) (stretcher)  OT Visit Diagnosis: Unsteadiness  on feet (R26.81);Other abnormalities of gait and mobility (R26.89);Muscle weakness (generalized) (M62.81);History of falling (Z91.81);Other symptoms and signs involving the nervous system (R29.898);Other symptoms and signs involving cognitive function                Time: 8993-8951 OT Time Calculation (min): 42 min Charges:  OT General Charges $OT Visit: 1 Visit OT Evaluation $OT Eval Moderate Complexity: 1 Mod  Brien Lowe, OT/L   Acute OT Clinical Specialist Acute Rehabilitation Services Pager [phone] Office [phone]   [facility] 04/14/2024, 11:08 AM

## 2024-04-14 NOTE — ED Notes (Signed)
 CCMD called.

## 2024-04-14 NOTE — ED Notes (Signed)
 PT\OT in room assessing patient

## 2024-04-15 ENCOUNTER — Encounter (HOSPITAL_COMMUNITY): Payer: Self-pay | Admitting: Internal Medicine

## 2024-04-15 DIAGNOSIS — G9341 Metabolic encephalopathy: Secondary | ICD-10-CM | POA: Diagnosis not present

## 2024-04-15 LAB — C DIFFICILE QUICK SCREEN W PCR REFLEX
C Diff antigen: NEGATIVE
C Diff interpretation: NOT DETECTED
C Diff toxin: NEGATIVE

## 2024-04-15 LAB — CBC
HCT: 47.8 % (ref 39.0–52.0)
Hemoglobin: 15.6 g/dL (ref 13.0–17.0)
MCH: 31.5 pg (ref 26.0–34.0)
MCHC: 32.6 g/dL (ref 30.0–36.0)
MCV: 96.6 fL (ref 80.0–100.0)
Platelets: 186 K/uL (ref 150–400)
RBC: 4.95 MIL/uL (ref 4.22–5.81)
RDW: 13.3 % (ref 11.5–15.5)
WBC: 8.1 K/uL (ref 4.0–10.5)
nRBC: 0 % (ref 0.0–0.2)

## 2024-04-15 LAB — COMPREHENSIVE METABOLIC PANEL WITH GFR
ALT: 14 U/L (ref 0–44)
AST: 16 U/L (ref 15–41)
Albumin: 2.9 g/dL — ABNORMAL LOW (ref 3.5–5.0)
Alkaline Phosphatase: 53 U/L (ref 38–126)
Anion gap: 11 (ref 5–15)
BUN: 13 mg/dL (ref 8–23)
CO2: 25 mmol/L (ref 22–32)
Calcium: 8.7 mg/dL — ABNORMAL LOW (ref 8.9–10.3)
Chloride: 103 mmol/L (ref 98–111)
Creatinine, Ser: 0.99 mg/dL (ref 0.61–1.24)
GFR, Estimated: >60 mL/min (ref >60)
Glucose, Bld: 80 mg/dL (ref 70–99)
Potassium: 3.7 mmol/L (ref 3.5–5.1)
Sodium: 139 mmol/L (ref 135–145)
Total Bilirubin: 1.1 mg/dL (ref 0.0–1.2)
Total Protein: 6.1 g/dL — ABNORMAL LOW (ref 6.5–8.1)

## 2024-04-15 LAB — URINE CULTURE: Culture: <10000 — AB

## 2024-04-15 LAB — MAGNESIUM: Magnesium: 1.7 mg/dL (ref 1.7–2.4)

## 2024-04-15 MED ORDER — LOPERAMIDE HCL 2 MG PO CAPS
2.0000 mg | ORAL_CAPSULE | Freq: Once | ORAL | Status: AC
  Administered 2024-04-15: 2 mg via ORAL
  Filled 2024-04-15: qty 1

## 2024-04-15 MED ORDER — SIMETHICONE 80 MG PO CHEW
80.0000 mg | CHEWABLE_TABLET | Freq: Four times a day (QID) | ORAL | Status: DC | PRN
Start: 2024-04-15 — End: 2024-04-19
  Administered 2024-04-15: 80 mg via ORAL
  Filled 2024-04-15: qty 1

## 2024-04-15 NOTE — NC FL2 (Signed)
 [place]  MEDICAID FL2 LEVEL OF CARE FORM     IDENTIFICATION  Patient Name: Erik [name] Birthdate: 1940-11-03 Sex: male Admission Date (Current Location): 04/13/2024  [facility] and [place] Number:  Producer, Television/film/video and Address:  [facility], [address] [place], [place] [address]      Provider Number: [id number]  Attending Physician Name and Address:  Trixie Nilda HERO, MD  Relative Name and Phone Number:  Nola Gammon (Significant other)  [phone]    Current Level of Care: Hospital Recommended Level of Care: Skilled Nursing Facility Prior Approval Number:    Date Approved/Denied:   PASRR Number: Pending  Discharge Plan: SNF    Current Diagnoses: Patient Active Problem List   Diagnosis Date Noted   Generalized weakness 04/14/2024   Acute metabolic encephalopathy 04/13/2024   FUO (fever of unknown origin) 10/28/2023   Chronic obstructive pulmonary disease (HCC) 10/27/2023   Enlarged prostate 10/27/2023   Hyperlipidemia 10/27/2023   Dementia (HCC) 10/27/2023   Urinary incontinence 10/27/2023   Low back pain 10/27/2023   Acute encephalopathy 10/27/2023   SIRS (systemic inflammatory response syndrome) (HCC) 10/27/2023   Secondary hypercoagulable state 12/06/2020   Bradycardia    Acute urinary obstruction    Urinary retention    Hydronephrosis 06/12/2020   Atrial fibrillation with RVR (HCC) 08/10/2017   Chest pain 08/10/2017   ETOH abuse 02/16/2015   Dyspnea 09/01/2014   Snoring 09/01/2014   Periodic limb movement disorder (PLMD) 09/01/2014   Chronotropic incompetence 04/26/2011   History of TIA (transient ischemic attack) 11/18/2010   CAD (coronary artery disease) 10/05/2010   Essential hypertension 02/03/2009   Paroxysmal atrial fibrillation (HCC) 02/03/2009   Atrial flutter (HCC) 02/03/2009    Orientation RESPIRATION BLADDER Height & Weight     Self  Normal Incontinent Weight: 169 lb 12.1 oz (77 kg) Height:  5'  11 (180.3 cm)  BEHAVIORAL SYMPTOMS/MOOD NEUROLOGICAL BOWEL NUTRITION STATUS      Incontinent Diet (see dc summary)  AMBULATORY STATUS COMMUNICATION OF NEEDS Skin   Extensive Assist (Moderate assist x2)   Other (Comment) (redness)                       Personal Care Assistance Level of Assistance  Bathing, Feeding, Dressing Bathing Assistance: Maximum assistance Feeding assistance: Limited assistance Dressing Assistance: Maximum assistance (Total assistance per PT note)     Functional Limitations Info  Sight, Hearing, Speech Sight Info: Impaired Hearing Info: Impaired Speech Info: Adequate    SPECIAL CARE FACTORS FREQUENCY  PT (By licensed PT), OT (By licensed OT)     PT Frequency: 5x/wk OT Frequency: 5x/wk            Contractures Contractures Info: Not present    Additional Factors Info  Code Status, Allergies Code Status Info: FULL Allergies Info: Albuterol  Sulfate (Albuterol )  Hyoscyamine Sulfate  Adhesive (Tape)  Warfarin And Related           Current Medications (04/15/2024):  This is the current hospital active medication list Current Facility-Administered Medications  Medication Dose Route Frequency Provider Last Rate Last Admin   acetaminophen  (TYLENOL ) tablet 650 mg  650 mg Oral Q6H PRN Alfornia Madison, MD       Or   acetaminophen  (TYLENOL ) suppository 650 mg  650 mg Rectal Q6H PRN Alfornia Madison, MD       apixaban  (ELIQUIS ) tablet 5 mg  5 mg Oral BID Rathore, Vasundhra, MD   5 mg at  04/15/24 0857   carbidopa -levodopa  (SINEMET  IR) 25-100 MG per tablet immediate release 1 tablet  1 tablet Oral TID Rathore, Vasundhra, MD   1 tablet at 04/15/24 0855   cefTRIAXone  (ROCEPHIN ) 2 g in sodium chloride  0.9 % 100 mL IVPB  2 g Intravenous Q24H Rathore, Vasundhra, MD   Stopped at 04/14/24 1809   digoxin  (LANOXIN ) tablet 0.125 mg  0.125 mg Oral Daily Rathore, Vasundhra, MD   0.125 mg at 04/15/24 9144   donepezil  (ARICEPT ) tablet 10 mg  10 mg Oral QHS  Rathore, Vasundhra, MD   10 mg at 04/14/24 2200   finasteride  (PROSCAR ) tablet 5 mg  5 mg Oral QHS Rathore, Vasundhra, MD   5 mg at 04/14/24 2200   ipratropium (ATROVENT ) 0.06 % nasal spray 2 spray  2 spray Nasal TID Rathore, Vasundhra, MD   2 spray at 04/15/24 0858   memantine  (NAMENDA ) tablet 10 mg  10 mg Oral BID Rathore, Vasundhra, MD   10 mg at 04/15/24 9144   metoprolol  succinate (TOPROL -XL) 24 hr tablet 25 mg  25 mg Oral QHS Rathore, Vasundhra, MD   25 mg at 04/14/24 2159   tamsulosin  (FLOMAX ) capsule 0.4 mg  0.4 mg Oral QPC breakfast Alfornia Madison, MD   0.4 mg at 04/15/24 0855   umeclidinium bromide  (INCRUSE ELLIPTA ) 62.5 MCG/ACT 1 puff  1 puff Inhalation Daily Rathore, Vasundhra, MD   1 puff at 04/15/24 0857     Discharge Medications: Please see discharge summary for a list of discharge medications.  Relevant Imaging Results:  Relevant Lab Results:   Additional Information SSN: [id number]  Lendia Dais, LCSWA

## 2024-04-15 NOTE — Telephone Encounter (Signed)
 Patient's wife is calling for follow up on her call from last week.

## 2024-04-15 NOTE — Telephone Encounter (Signed)
 I called the patient's significant other and let her know what the pharmacist said about the Trazodone . Per Chris Pavero,  Trazodone  can cause Qt interval prolongation, arrhthymias such as ventricular fibrillation and tachycardia. Would use with caution.   She was receptive and stated he will use with caution or not at all.

## 2024-04-15 NOTE — Progress Notes (Signed)
 PROGRESS NOTE  Erik [name] FMW:[id number] DOB: March 09, 1941 DOA: 04/13/2024 PCP: Leonel Cole, MD   LOS: 1 day   Brief Narrative / Interim history: 83 year old male with Parkinson's disease, dementia, chronic A-fib/flutter on Eliquis , CAD, HTN, HLD, prior stroke, COPD who comes into the hospital with altered mental status.  Wife tells me that he has been increasingly confused, irritable, signs that have been associated with prior UTIs and improved with antibiotics.  Subjective / 24h Interval events: Patient has no complaints this morning, no chest pain, no shortness of breath, no abdominal pain, no nausea or vomiting.  His wife is at bedside and tells me that he is improved but still not back to baseline in terms of his mobility and mental status  Assesement and Plan: Principal problem Metabolic encephalopathy, dehydration-he has not been eating and drinking well for the last several days, has been having very poor balance and increased confusion.  Initially placed on IV fluids, will not continue today as his appetite is picking up and he is eating more - PT recommends SNF to which family is agreeable.  TOC consulted  Active problems UTI-borderline, has some bacteria in the urine but no significant pyuria.  Continue ceftriaxone  while monitoring cultures  Parkinson's disease-continue home medications  Dementia-continue home medications  Chronic A-fib-rate controlled, continue Eliquis , metoprolol , digoxin   BPH-continue home medications  History of stroke-on Eliquis   Essential hypertension-continue home medications  COPD-no wheezing, respiratory status appears at baseline  Generalized weakness-needs SNF  Scheduled Meds:  apixaban   5 mg Oral BID   carbidopa -levodopa   1 tablet Oral TID   digoxin   0.125 mg Oral Daily   donepezil   10 mg Oral QHS   finasteride   5 mg Oral QHS   ipratropium  2 spray Nasal TID   memantine   10 mg Oral BID   metoprolol  succinate  25 mg Oral QHS    tamsulosin   0.4 mg Oral QPC breakfast   umeclidinium bromide   1 puff Inhalation Daily   Continuous Infusions:  cefTRIAXone  (ROCEPHIN )  IV Stopped (04/14/24 1809)   PRN Meds:.acetaminophen  **OR** acetaminophen   Current Outpatient Medications  Medication Instructions   acetaminophen  (TYLENOL ) 500-1,000 mg, Every 6 hours PRN   amLODipine  (NORVASC ) 5 mg, Oral, Daily PRN   carbidopa -levodopa  (SINEMET  IR) 25-100 MG tablet Take 1 tablet three times daily.   Carboxymethylcellulose Sodium (THERATEARS) 0.25 % SOLN 1 drop, See admin instructions   digoxin  (LANOXIN ) 0.125 MG tablet TAKE 1 TABLET BY MOUTH DAILY. EXCEPT ON SUNDAY   donepezil  (ARICEPT ) 10 mg, Daily at bedtime   ELIQUIS  5 MG TABS tablet TAKE 1 TABLET BY MOUTH TWICE A DAY   FIBER PO 1 capsule, Daily   finasteride  (PROSCAR ) 5 mg, Daily at bedtime   INCRUSE ELLIPTA  62.5 MCG/INH AEPB 1 puff, Daily   ipratropium (ATROVENT ) 0.03 % nasal spray 2 sprays, 3 times daily   memantine  (NAMENDA ) 10 MG tablet Take one tablet twice a day   metoprolol  succinate (TOPROL -XL) 25 mg, Oral, Daily   Multiple Vitamins-Minerals (PRESERVISION AREDS 2+MULTI VIT PO) 1 capsule, 2 times daily   nitrofurantoin  (macrocrystal-monohydrate) (MACROBID ) 100 mg, Daily   nitroGLYCERIN  (NITROSTAT ) 0.4 mg, Sublingual, Every 5 min PRN   polyethylene glycol (MIRALAX  / GLYCOLAX ) 17 g, Oral, Daily PRN   tamsulosin  (FLOMAX ) 0.4 mg, Oral, Daily after breakfast   traZODone  (DESYREL ) 50 MG tablet Take 1/2 tab po qhs   Vitamin D3 2,000 Units, Every morning    Diet Orders (From admission, onward)     Start  Ordered   04/14/24 0046  Diet Heart Room service appropriate? Yes; Fluid consistency: Thin  Diet effective now       Question Answer Comment  Room service appropriate? Yes   Fluid consistency: Thin      04/14/24 0047            DVT prophylaxis:  apixaban  (ELIQUIS ) tablet 5 mg   Lab Results  Component Value Date   PLT 186 04/15/2024      Code Status: Full  Code  Family Communication: Wife at bedside  Status is: Observation The patient will require care spanning > 2 midnights and should be moved to inpatient because: Confused, IV antibiotics   Level of care: Telemetry  Consultants:  None  Objective: Vitals:   04/14/24 1800 04/14/24 2025 04/15/24 0509 04/15/24 0805  BP: 136/68 (!) 155/77 (!) 145/91 (!) 103/33  Pulse: 78 (!) 58 64 80  Resp: 16 18 18 18   Temp: 98.7 F (37.1 C) 97.7 F (36.5 C) 99.3 F (37.4 C) 98.3 F (36.8 C)  TempSrc: Oral     SpO2: 97% 96% 94% 96%  Weight:      Height:        Intake/Output Summary (Last 24 hours) at 04/15/2024 1012 Last data filed at 04/15/2024 0825 Gross per 24 hour  Intake 1785 ml  Output 1600 ml  Net 185 ml   Wt Readings from Last 3 Encounters:  04/13/24 77 kg  03/10/24 77.1 kg  11/30/23 96 kg    Examination:  Constitutional: NAD Eyes: lids and conjunctivae normal, no scleral icterus ENMT: mmm Neck: normal, supple Respiratory: clear to auscultation bilaterally, no wheezing, no crackles. Normal respiratory effort.  Cardiovascular: Regular rate and rhythm, no murmurs / rubs / gallops. No LE edema. Abdomen: soft, no distention, no tenderness. Bowel sounds positive.   Data Reviewed: I have independently reviewed following labs and imaging studies   CBC Recent Labs  Lab 04/13/24 1900 04/14/24 0200 04/15/24 0359  WBC 12.1* 10.3 8.1  HGB 16.6 15.1 15.6  HCT 50.6 45.3 47.8  PLT 204 212 186  MCV 97.5 95.4 96.6  MCH 32.0 31.8 31.5  MCHC 32.8 33.3 32.6  RDW 13.1 13.2 13.3  LYMPHSABS 0.3*  --   --   MONOABS 0.4  --   --   EOSABS 0.0  --   --   BASOSABS 0.0  --   --     Recent Labs  Lab 04/13/24 1900 04/13/24 1906 04/14/24 0200 04/14/24 1431 04/15/24 0359  NA 139  --   --   --  139  K 4.4  --   --   --  3.7  CL 105  --   --   --  103  CO2 22  --   --   --  25  GLUCOSE 83  --   --   --  80  BUN 22  --   --   --  13  CREATININE 1.00  --   --   --  0.99   CALCIUM 9.0  --   --   --  8.7*  AST 16  --   --   --  16  ALT 9  --   --   --  14  ALKPHOS 61  --   --   --  53  BILITOT 2.1*  --   --   --  1.1  ALBUMIN 3.4*  --   --   --  2.9*  MG  --   --   --   --  1.7  LATICACIDVEN  --  1.7  --   --   --   INR 1.3*  --   --   --   --   TSH  --   --  0.609  --   --   AMMONIA  --   --  38*  --   --   BNP  --   --   --  316.8*  --     ------------------------------------------------------------------------------------------------------------------ No results for input(s): CHOL, HDL, LDLCALC, TRIG, CHOLHDL, LDLDIRECT in the last 72 hours.  Lab Results  Component Value Date   HGBA1C 5.8 (H) 11/13/2010   ------------------------------------------------------------------------------------------------------------------ Recent Labs    04/14/24 0200  TSH 0.609    Cardiac Enzymes No results for input(s): CKMB, TROPONINI, MYOGLOBIN in the last 168 hours.  Invalid input(s): CK ------------------------------------------------------------------------------------------------------------------    Component Value Date/Time   BNP 316.8 (H) 04/14/2024 1431    CBG: No results for input(s): GLUCAP in the last 168 hours.  Recent Results (from the past 240 hours)  Blood Culture (routine x 2)     Status: None (Preliminary result)   Collection Time: 04/13/24  6:43 PM   Specimen: BLOOD  Result Value Ref Range Status   Specimen Description BLOOD SITE NOT SPECIFIED  Final   Special Requests   Final    BOTTLES DRAWN AEROBIC AND ANAEROBIC Blood Culture results may not be optimal due to an inadequate volume of blood received in culture bottles   Culture   Final    NO GROWTH 2 DAYS Performed at [facility], [address], [place], [place] [address]    Report Status PENDING  Incomplete  Blood Culture (routine x 2)     Status: None (Preliminary result)   Collection Time: 04/13/24  8:47 PM   Specimen: BLOOD  Result Value  Ref Range Status   Specimen Description BLOOD SITE NOT SPECIFIED  Final   Special Requests   Final    BOTTLES DRAWN AEROBIC ONLY Blood Culture results may not be optimal due to an inadequate volume of blood received in culture bottles   Culture   Final    NO GROWTH 2 DAYS Performed at [facility], [address] [place], [place] [address]    Report Status PENDING  Incomplete  Resp panel by RT-PCR (RSV, Flu A&B, Covid) Anterior Nasal Swab     Status: None   Collection Time: 04/13/24  8:54 PM   Specimen: Anterior Nasal Swab  Result Value Ref Range Status   SARS Coronavirus 2 by RT PCR NEGATIVE NEGATIVE Final   Influenza A by PCR NEGATIVE NEGATIVE Final   Influenza B by PCR NEGATIVE NEGATIVE Final    Comment: (NOTE) The Xpert Xpress SARS-CoV-2/FLU/RSV plus assay is intended as an aid in the diagnosis of influenza from Nasopharyngeal swab specimens and should not be used as a sole basis for treatment. Nasal washings and aspirates are unacceptable for Xpert Xpress SARS-CoV-2/FLU/RSV testing.  Fact Sheet for Patients: bloggercourse.com  Fact Sheet for Healthcare Providers: seriousbroker.it  This test is not yet approved or cleared by the [place]  FDA and has been authorized for detection and/or diagnosis of SARS-CoV-2 by FDA under an Emergency Use Authorization (EUA). This EUA will remain in effect (meaning this test can be used) for the duration of the COVID-19 declaration under Section 564(b)(1) of the Act, 21 U.S.C. section 360bbb-3(b)(1), unless  the authorization is terminated or revoked.     Resp Syncytial Virus by PCR NEGATIVE NEGATIVE Final    Comment: (NOTE) Fact Sheet for Patients: bloggercourse.com  Fact Sheet for Healthcare Providers: seriousbroker.it  This test is not yet approved or cleared by the [place]  FDA and has been authorized for detection  and/or diagnosis of SARS-CoV-2 by FDA under an Emergency Use Authorization (EUA). This EUA will remain in effect (meaning this test can be used) for the duration of the COVID-19 declaration under Section 564(b)(1) of the Act, 21 U.S.C. section 360bbb-3(b)(1), unless the authorization is terminated or revoked.  Performed at [facility], [address], [place], [place] [address]   Urine Culture (for pregnant, neutropenic or urologic patients or patients with an indwelling urinary catheter)     Status: Abnormal   Collection Time: 04/14/24 12:47 AM   Specimen: Urine, Clean Catch  Result Value Ref Range Status   Specimen Description URINE, CLEAN CATCH  Final   Special Requests NONE  Final   Culture (A)  Final    <10,000 COLONIES/mL INSIGNIFICANT GROWTH Performed at [facility], [address], [place], [place] [address]    Report Status 04/15/2024 FINAL  Final  C Difficile Quick Screen w PCR reflex     Status: None   Collection Time: 04/14/24  4:45 PM   Specimen: STOOL  Result Value Ref Range Status   C Diff antigen NEGATIVE NEGATIVE Final   C Diff toxin NEGATIVE NEGATIVE Final   C Diff interpretation No C. difficile detected.  Final    Comment: Performed at [facility], [address] [place], [place] [address]     Radiology Studies: No results found.    Nilda Fendt, MD, PhD [facility]  Between 7 am - 7 pm I am available, please contact me via Amion (for emergencies) or Securechat (non urgent messages)  Between 7 pm - 7 am I am not available, please contact night coverage MD/APP via Amion

## 2024-04-15 NOTE — Progress Notes (Addendum)
 8:33 - Pt is disoriented x3, CSW spoke to pt's wife via phone and informed her of PT recs of SNF. CSW asked if they would be agreeable and [name] was agreeable. CSW left Medicare.gov list at bedside.   12:55 - CSW received call from [name] who inquired about the SNF process. CSW explained the process and [name] stated understanding. CSW also explained that d/t the pt's dx of dementia that they would only be eligible to attend a SNF that offers memory care services. Viktoria stated understanding.   CSW sent out bed offers in the HUB and pending offers. [facility] is still pending.   CSW will continue to monitor.

## 2024-04-15 NOTE — Plan of Care (Signed)
  Problem: Education: Goal: Knowledge of General Education information will improve Description: Including pain rating scale, medication(s)/side effects and non-pharmacologic comfort measures Outcome: Progressing   Problem: Clinical Measurements: Goal: Ability to maintain clinical measurements within normal limits will improve Outcome: Progressing   Problem: Activity: Goal: Risk for activity intolerance will decrease Outcome: Progressing   Problem: Coping: Goal: Level of anxiety will decrease Outcome: Progressing   Problem: Safety: Goal: Ability to remain free from injury will improve Outcome: Progressing   Problem: Skin Integrity: Goal: Risk for impaired skin integrity will decrease Outcome: Progressing   

## 2024-04-15 NOTE — Progress Notes (Addendum)
 RE: Erik [name] Date of Birth: [birth year] Date: 04/15/2024   To Whom It May Concern:  Please be advised that the above-named patient will require a short-term nursing home stay - anticipated 30 days or less for rehabilitation and strengthening.  The plan is for return home.                 MD signature

## 2024-04-16 DIAGNOSIS — G9341 Metabolic encephalopathy: Secondary | ICD-10-CM | POA: Diagnosis not present

## 2024-04-16 LAB — COMPREHENSIVE METABOLIC PANEL WITH GFR
ALT: 15 U/L (ref 0–44)
AST: 16 U/L (ref 15–41)
Albumin: 2.8 g/dL — ABNORMAL LOW (ref 3.5–5.0)
Alkaline Phosphatase: 50 U/L (ref 38–126)
Anion gap: 12 (ref 5–15)
BUN: 15 mg/dL (ref 8–23)
CO2: 23 mmol/L (ref 22–32)
Calcium: 8.6 mg/dL — ABNORMAL LOW (ref 8.9–10.3)
Chloride: 103 mmol/L (ref 98–111)
Creatinine, Ser: 0.97 mg/dL (ref 0.61–1.24)
GFR, Estimated: >60 mL/min (ref >60)
Glucose, Bld: 93 mg/dL (ref 70–99)
Potassium: 3.4 mmol/L — ABNORMAL LOW (ref 3.5–5.1)
Sodium: 138 mmol/L (ref 135–145)
Total Bilirubin: 1 mg/dL (ref 0.0–1.2)
Total Protein: 5.9 g/dL — ABNORMAL LOW (ref 6.5–8.1)

## 2024-04-16 LAB — CBC
HCT: 44.9 % (ref 39.0–52.0)
Hemoglobin: 15 g/dL (ref 13.0–17.0)
MCH: 31.8 pg (ref 26.0–34.0)
MCHC: 33.4 g/dL (ref 30.0–36.0)
MCV: 95.1 fL (ref 80.0–100.0)
Platelets: 201 K/uL (ref 150–400)
RBC: 4.72 MIL/uL (ref 4.22–5.81)
RDW: 13.3 % (ref 11.5–15.5)
WBC: 9.7 K/uL (ref 4.0–10.5)
nRBC: 0 % (ref 0.0–0.2)

## 2024-04-16 LAB — GLUCOSE, CAPILLARY: Glucose-Capillary: 104 mg/dL — ABNORMAL HIGH (ref 70–99)

## 2024-04-16 LAB — MAGNESIUM: Magnesium: 1.9 mg/dL (ref 1.7–2.4)

## 2024-04-16 MED ORDER — LOPERAMIDE HCL 2 MG PO CAPS
2.0000 mg | ORAL_CAPSULE | Freq: Once | ORAL | Status: AC
  Administered 2024-04-16: 2 mg via ORAL
  Filled 2024-04-16: qty 1

## 2024-04-16 MED ORDER — ORAL CARE MOUTH RINSE: 15.0000 mL | OROMUCOSAL | Status: DC | PRN

## 2024-04-16 MED ORDER — DIPHENOXYLATE-ATROPINE 2.5-0.025 MG PO TABS
1.0000 | ORAL_TABLET | Freq: Once | ORAL | Status: AC
  Administered 2024-04-16: 1 via ORAL
  Filled 2024-04-16: qty 1

## 2024-04-16 NOTE — Hospital Course (Signed)
 83 y.o. 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 Wife tells me that he has been increasingly confused, irritable, signs that have been associated with prior UTIs and improved with antibiotics.   Here, UCx no growth. CXR clear.

## 2024-04-16 NOTE — Progress Notes (Signed)
 Physical Therapy Treatment Patient Details Name: Erik [name] MRN: [id number] DOB: [birth year] Today's Date: 04/16/2024   History of Present Illness 83 y.o. male admitted 04/13/24 for metabolic encephalopathy, dehydration, possible UTI. PMH includes Parkinsons, dementia, CVA, HTN, CAD, a fib on Eliquis , BPH, COPD, TIA, bradycardia, alcohol  use, recurrent UTIs, chronic back pain.   PT Comments  Pt progressing with mobility. Today's session focused on transfer training, gait initiation and BLE strengthening. Pt able to stand and take steps with RW and frequent modA to prevent LOB. Pt remains limited by generalized weakness, decreased activity tolerance, poor balance strategies/postural reactions and impaired cognition. Continue to recommend post-acute rehab (<3 hrs/day) to maximize functional mobility and independence prior to return home.     If plan is discharge home, recommend the following: A lot of help with walking and/or transfers;A lot of help with bathing/dressing/bathroom;Assistance with cooking/housework;Assist for transportation;Help with stairs or ramp for entrance   Can travel by private vehicle     No  Equipment Recommendations  Wheelchair (measurements PT);Wheelchair cushion (measurements PT)    Recommendations for Other Services       Precautions / Restrictions Precautions Precautions: Fall;Other (comment) Recall of Precautions/Restrictions: Impaired Precaution/Restrictions Comments: bowel incontinence Restrictions Weight Bearing Restrictions Per Provider Order: No     Mobility  Bed Mobility Overal bed mobility: Needs Assistance Bed Mobility: Supine to Sit     Supine to sit: Supervision, HOB elevated, Used rails          Transfers Overall transfer level: Needs assistance Equipment used: Rolling walker (2 wheels) Transfers: Sit to/from Stand Sit to Stand: Mod assist, Min assist, Contact guard assist           General transfer comment: multiple  sit<>stands from EOB (4x) and recliner (6x) to RW; pt repeatedly attempts to stand pulling on RW despite it tipping over and him falling backwards, cues for hand placement and sequencing, requiring intermittent reminders on subsequent trials. CGA-modA for trunk elevation and stability    Ambulation/Gait Ambulation/Gait assistance: Mod assist Gait Distance (Feet): 8 Feet Assistive device: Rolling walker (2 wheels) Gait Pattern/deviations: Step-to pattern, Shuffle, Trunk flexed, Leaning posteriorly Gait velocity: Decreased     General Gait Details: slow, shuffling steps with decreased foot clearance ~4' along EOB with RW and intermittent modA for stability, seated rest; pivotal steps ~2' from bed>recliner, pt with increased difficulty sequencing requiring max verbal cues and assist for RW management, pt starting to sit prematurely requiring maxA to safely pivot hips to chair. additional 2x bouts of marching in place with improved foot clearance, RW and minA for stability including steps fowards/backwards (~4'). pt frequently rolling RW too far forward requiring cues for safety   Stairs             Wheelchair Mobility     Tilt Bed    Modified Rankin (Stroke Patients Only)       Balance Overall balance assessment: Needs assistance Sitting-balance support: Feet supported Sitting balance-Leahy Scale: Fair     Standing balance support: Bilateral upper extremity supported, Reliant on assistive device for balance Standing balance-Leahy Scale: Poor Standing balance comment: reliant on RW; dependent for posterior pericare from bowel incontinence                            Communication Communication Communication: Impaired Factors Affecting Communication: Other (comment) (improved speech though still mumbled/difficult to understand sometimes)  Cognition Arousal: Alert Behavior During Therapy: Flat affect   PT -  Cognitive impairments: Orientation, Memory, Attention,  Problem solving, Initiation, Safety/Judgement, Awareness, History of cognitive impairments, Sequencing                       PT - Cognition Comments: h/o dementia, Parkinson's, CVA. wife repeatedly states, When his mind is like this it's because he has a UTI Following commands: Impaired Following commands impaired: Follows one step commands inconsistently    Cueing Cueing Techniques: Verbal cues, Gestural cues, Tactile cues, Visual cues  Exercises Other Exercises Other Exercises: 5x repeated sit<>stands (heavy reliance on BUE support, easily distracted requiring frequent redirection); tolerates ~15-sec bouts marching in place, sitting himself down due to fatigue, performed 2x    General Comments General comments (skin integrity, edema, etc.): wife present and supportive      Pertinent Vitals/Pain Pain Assessment Pain Assessment: Faces Faces Pain Scale: Hurts a little bit Pain Location: generalized Pain Descriptors / Indicators: Discomfort Pain Intervention(s): Monitored during session, Limited activity within patient's tolerance, Repositioned    Home Living                          Prior Function            PT Goals (current goals can now be found in the care plan section) Progress towards PT goals: Progressing toward goals    Frequency    Min 2X/week      PT Plan      Co-evaluation              AM-PAC PT 6 Clicks Mobility   Outcome Measure  Help needed turning from your back to your side while in a flat bed without using bedrails?: A Little Help needed moving from lying on your back to sitting on the side of a flat bed without using bedrails?: A Lot Help needed moving to and from a bed to a chair (including a wheelchair)?: A Lot Help needed standing up from a chair using your arms (e.g., wheelchair or bedside chair)?: A Lot Help needed to walk in hospital room?: A Lot Help needed climbing 3-5 steps with a railing? : Total 6 Click  Score: 12    End of Session Equipment Utilized During Treatment: Gait belt Activity Tolerance: Patient tolerated treatment well Patient left: in chair;with call bell/phone within reach;with chair alarm set Nurse Communication: Mobility status;Need for lift equipment PT Visit Diagnosis: Unsteadiness on feet (R26.81);Other abnormalities of gait and mobility (R26.89);Muscle weakness (generalized) (M62.81)     Time: 8841-8762 PT Time Calculation (min) (ACUTE ONLY): 39 min  Charges:    $Therapeutic Exercise: 8-22 mins $Therapeutic Activity: 23-37 mins PT General Charges $$ ACUTE PT VISIT: 1 Visit                     Darice Almas, PT, DPT Acute Rehabilitation Services  Personal: Secure Chat Rehab Office: [phone]  Darice LITTIE Almas 04/16/2024, 2:09 PM

## 2024-04-16 NOTE — Progress Notes (Signed)
  Progress Note   Patient: Erik [name] FMW:[id number] DOB: 06/14/1940 DOA: 04/13/2024     2 DOS: the patient was seen and examined on 04/16/2024 at 8:54AM      Brief hospital course: 83 y.o. 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 Wife tells me that he has been increasingly confused, irritable, signs that have been associated with prior UTIs and improved with antibiotics.   Here, UCx no growth. CXR clear.     Assessment and Plan: Acute metabolic encephalopathy Dehydration See interim summary by Dr. Trixie -Encourage oral intake    UTI ruled out Urine culture no growth. - Stop antibiotics   Parkinson's disease  -Continue home Sinemet   Dementia  -Continue home donepezil , memantine   Chronic A-fib Rate slow - Continue digoxin , metoprolol  - Continue Eliquis   Cerebrovascular disease Essential hypertension No evidence of stroke - Continue Eliquis , metoprolol   COPD     No evidence of flare - Continue home Incruse         Subjective: Patient still weak and somewhat confused.  No fever, no respiratory distress.  Urine culture negative.     Physical Exam: BP (!) 105/56 (BP Location: Left Arm)   Pulse 65   Temp (!) 97.2 F (36.2 C) (Oral)   Resp 18   Ht 5' 11 (1.803 m)   Wt 77 kg   SpO2 93%   BMI 23.68 kg/m   Adult male, lying in bed, appears tired, psycnomotor slowing noted Heart rate slow, irregular, no murmurs, no peripheral edema Respiratory rate normal, lungs clear without rales or wheezes Abdomen soft without tenderness palpation or guarding, no ascites or distention Attention distracted, psychomotor slowing noted, generalized weakness but symmetric strength, no tremor, oriented to self only, not to hospital, [facility], or family   Data Reviewed: Basic metabolic panel shows mild hypokalemia, normal  renal function CBC normal     Family Communication: wife    Disposition: Status is: Inpatient         Author: Lonni SHAUNNA Dalton, MD 04/16/2024 3:38 PM  For on call review [email or web address].

## 2024-04-17 DIAGNOSIS — G9341 Metabolic encephalopathy: Secondary | ICD-10-CM | POA: Diagnosis not present

## 2024-04-17 LAB — CBC
HCT: 44.8 % (ref 39.0–52.0)
Hemoglobin: 14.8 g/dL (ref 13.0–17.0)
MCH: 31.8 pg (ref 26.0–34.0)
MCHC: 33 g/dL (ref 30.0–36.0)
MCV: 96.1 fL (ref 80.0–100.0)
Platelets: 206 K/uL (ref 150–400)
RBC: 4.66 MIL/uL (ref 4.22–5.81)
RDW: 13.3 % (ref 11.5–15.5)
WBC: 6.8 K/uL (ref 4.0–10.5)
nRBC: 0 % (ref 0.0–0.2)

## 2024-04-17 LAB — BASIC METABOLIC PANEL WITH GFR
Anion gap: 9 (ref 5–15)
BUN: 13 mg/dL (ref 8–23)
CO2: 25 mmol/L (ref 22–32)
Calcium: 8.7 mg/dL — ABNORMAL LOW (ref 8.9–10.3)
Chloride: 107 mmol/L (ref 98–111)
Creatinine, Ser: 0.97 mg/dL (ref 0.61–1.24)
GFR, Estimated: >60 mL/min (ref >60)
Glucose, Bld: 97 mg/dL (ref 70–99)
Potassium: 3.7 mmol/L (ref 3.5–5.1)
Sodium: 141 mmol/L (ref 135–145)

## 2024-04-17 MED ORDER — NITROFURANTOIN MONOHYD MACRO 100 MG PO CAPS
100.0000 mg | ORAL_CAPSULE | Freq: Every day | ORAL | Status: DC
  Administered 2024-04-17 – 2024-04-19 (×3): 100 mg via ORAL
  Filled 2024-04-17 (×4): qty 1

## 2024-04-17 NOTE — TOC Progression Note (Addendum)
 Transition of Care [facility]) - Progression Note    Patient Details  Name: Erik [name] MRN: [id number] Date of Birth: 1940/07/04  Transition of Care [facility]) CM/SW Contact  Lendia Dais, [place] Phone Number: 04/17/2024, 8:32 AM  Clinical Narrative: D/t the pt having a dx of dementia, a SNF w/ memory care services are needed. There are only a few SNF's w/ [place] services in [place]. 2 out of four have declined, [facility] is pending bed availiability and [facility] is pending a decision. D/t the [facility] admission coordinator is out until Monday.  CSW will continue to follow.                      Expected Discharge Plan and Services                                               Social Drivers of Health (SDOH) Interventions SDOH Screenings   Food Insecurity: No Food Insecurity (04/14/2024)  Housing: Low Risk  (04/14/2024)  Transportation Needs: No Transportation Needs (04/14/2024)  Utilities: Not At Risk (04/14/2024)  Social Connections: Moderately Isolated (04/14/2024)  Tobacco Use: Medium Risk (04/13/2024)    Readmission Risk Interventions    10/31/2023    1:45 PM  Readmission Risk Prevention Plan  Post Dischage Appt Complete  Medication Screening Complete  Transportation Screening Complete

## 2024-04-17 NOTE — Progress Notes (Signed)
  Progress Note   Patient: Erik [name] FMW:[id number] DOB: 1941/03/14 DOA: 04/13/2024     3 DOS: the patient was seen and examined on 04/17/2024        Brief hospital course: 83 y.o. 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 Wife tells me that he has been increasingly confused, irritable, signs that have been associated with prior UTIs and improved with antibiotics.   Here, UCx no growth. CXR clear.     Assessment and Plan: Acute metabolic encephalopathy due to delirium Dehydration See interim summary by Dr. Trixie -Encourage oral intake    UTI ruled out Urine culture no growth. - Stop antibiotics - Resume home nitrofurantoin    Parkinson's disease  -Continue home Sinemet   Dementia  -Continue home donepezil , memantine   Chronic A-fib Rate slow, occasional pauses asymptomatic - Continue digoxin , metoprolol  - Continue Eliquis   Cerebrovascular disease Essential hypertension No evidence of stroke - Continue Eliquis , metoprolol   COPD     No evidence of flare - Continue home Incruse         Subjective: No clinical change, no new complaints, no nursing concerns     Physical Exam: BP (!) 134/57 (BP Location: Left Arm)   Pulse 71   Temp (!) 97.3 F (36.3 C)   Resp 16   Ht 5' 11 (1.803 m)   Wt 77 kg   SpO2 93%   BMI 23.68 kg/m   Adult male, lying in bed, eating lunch with wife Heart rate slow, irregular, no murmurs, no peripheral edema Respiratory rate normal, lungs clear without rales or wheezes Abdomen soft without tenderness palpation or guarding, no ascites or distention Attention distracted, psychomotor slowing noted, generalized weakness but symmetric strength, no tremor, oriented to self only, not to hospital, [facility], or family   Data Reviewed: Basic metabolic panel normal CBC normal   Family  Communication: wife    Disposition: Status is: Inpatient         Author: Lonni SHAUNNA Dalton, MD 04/17/2024 12:46 PM  For on call review [email or web address].

## 2024-04-18 DIAGNOSIS — G9341 Metabolic encephalopathy: Secondary | ICD-10-CM | POA: Diagnosis not present

## 2024-04-18 LAB — CBC
HCT: 44.9 % (ref 39.0–52.0)
Hemoglobin: 15.1 g/dL (ref 13.0–17.0)
MCH: 32 pg (ref 26.0–34.0)
MCHC: 33.6 g/dL (ref 30.0–36.0)
MCV: 95.1 fL (ref 80.0–100.0)
Platelets: 231 K/uL (ref 150–400)
RBC: 4.72 MIL/uL (ref 4.22–5.81)
RDW: 13.1 % (ref 11.5–15.5)
WBC: 7.4 K/uL (ref 4.0–10.5)
nRBC: 0 % (ref 0.0–0.2)

## 2024-04-18 LAB — BASIC METABOLIC PANEL WITH GFR
Anion gap: 10 (ref 5–15)
BUN: 12 mg/dL (ref 8–23)
CO2: 25 mmol/L (ref 22–32)
Calcium: 8.9 mg/dL (ref 8.9–10.3)
Chloride: 106 mmol/L (ref 98–111)
Creatinine, Ser: 0.94 mg/dL (ref 0.61–1.24)
GFR, Estimated: >60 mL/min (ref >60)
Glucose, Bld: 97 mg/dL (ref 70–99)
Potassium: 3.8 mmol/L (ref 3.5–5.1)
Sodium: 141 mmol/L (ref 135–145)

## 2024-04-18 LAB — CULTURE, BLOOD (ROUTINE X 2)
Culture: NO GROWTH
Culture: NO GROWTH

## 2024-04-18 LAB — DIGOXIN LEVEL: Digoxin Level: 0.7 ng/mL — ABNORMAL LOW (ref 0.8–2.0)

## 2024-04-18 NOTE — Progress Notes (Signed)
  Progress Note   Patient: Erik [name] FMW:[id number] DOB: 12-08-1940 DOA: 04/13/2024     4 DOS: the patient was seen and examined on 04/18/2024        Brief hospital course: 83 y.o. 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 Wife tells me that he has been increasingly confused, irritable, signs that have been associated with prior UTIs and improved with antibiotics.   Here, UCx no growth. CXR clear.     Assessment and Plan: Acute metabolic encephalopathy due to delirium Dehydration See interim summary by Dr. Loreda Seems resolved  Parkinson disease Seems at baseline - Continue Sinemet   Dementia - Continue donepezil , Namenda   Chronic atrial fibrillation Rate controlled, asymptomatic pauses, digoxin  level low - Continue digoxin , metoprolol  - Continue Eliquis   History of stroke Hypertension No new stroke symptoms. - Continue Eliquis  and metoprolol   COPD No evidence of flare - Continue home Incruse  UTI ruled out Recurrent UTI Urine culture no growth - Continue home nitrofurantoin          Subjective: Patient is feeling well, is gradually improving.  Mentation seems close to baseline.  He is having worsening hallucinations at night.  During the day he is oriented and at baseline mentation.  No fever, no respiratory distress.     Physical Exam: BP (!) 120/59 (BP Location: Left Arm)   Pulse 67   Temp 97.6 F (36.4 C)   Resp 18   Ht 5' 11 (1.803 m)   Wt 77 kg   SpO2 95%   BMI 23.68 kg/m   Elderly adult male, sitting in bed, talking with family Heart rate slow, irregular, no peripheral edema, no murmurs Respiratory rate easy and unlabored, lungs clear without rales or wheezes Abdomen soft, no tenderness palpation or guarding, no ascites or distention Attention normal to conversation, psychomotor slowing  noted, no resting tremor, generalized weakness, upper extremity strength is 5-/5 and symmetric, oriented to family, self, hospital    Data Reviewed: Basic metabolic panel normal CBC normal    Family Communication: Wife and brother-in-law at the bedside    Disposition: Status is: Inpatient         Author: Lonni SHAUNNA Dalton, MD 04/18/2024 10:20 AM  For on call review [email or web address].

## 2024-04-18 NOTE — TOC Progression Note (Signed)
 Transition of Care [facility]) - Progression Note    Patient Details  Name: NICKLAS MCSWEENEY MRN: [id number] Date of Birth: March 29, 1941  Transition of Care [facility]) CM/SW Contact  Lendia Dais, [place] Phone Number: 04/18/2024, 8:46 AM  Clinical Narrative:  CSW spoke to pt's spouse Viktoria and mentioned that 2 out of 4 of the memory care SNF's have declined which leaves [facility] and [address]. Viktoria stated that [name] was too far and she would look into [facility].  CSW will continue to monitor for acceptances.                        Expected Discharge Plan and Services                                               Social Drivers of Health (SDOH) Interventions SDOH Screenings   Food Insecurity: No Food Insecurity (04/14/2024)  Housing: Low Risk  (04/14/2024)  Transportation Needs: No Transportation Needs (04/14/2024)  Utilities: Not At Risk (04/14/2024)  Social Connections: Moderately Isolated (04/14/2024)  Tobacco Use: Medium Risk (04/13/2024)    Readmission Risk Interventions    10/31/2023    1:45 PM  Readmission Risk Prevention Plan  Post Dischage Appt Complete  Medication Screening Complete  Transportation Screening Complete

## 2024-04-18 NOTE — Progress Notes (Signed)
 Physical Therapy Treatment Patient Details Name: Erik [name] MRN: [id number] DOB: 1941/01/31 Today's Date: 04/18/2024   History of Present Illness 83 y.o. male admitted 04/13/24 for metabolic encephalopathy, dehydration, possible UTI. PMH includes Parkinsons, dementia, CVA, HTN, CAD, a fib on Eliquis , BPH, COPD, TIA, bradycardia, alcohol  use, recurrent UTIs, chronic back pain.    PT Comments  Continuing work on functional mobility and activity tolerance;  session focused on progressive amb and functional transfers, with noted very nice progress with gait distance; Pt was able to engage more with PT, and wife present and supportive; She tells me that she is able to meet his needs at home, and would like for pt to dc to home; In considering options for discharge, I value going back to familiar environment, caregivers, and routines for patients with dementia. Will update DC plan to home and recommend maximizing HH services; notified TOC   If plan is discharge home, recommend the following: A lot of help with walking and/or transfers;A lot of help with bathing/dressing/bathroom;Assistance with cooking/housework;Assist for transportation;Help with stairs or ramp for entrance   Can travel by private vehicle     Yes  Equipment Recommendations  Wheelchair (measurements PT);Wheelchair cushion (measurements PT)    Recommendations for Other Services       Precautions / Restrictions Precautions Precautions: Fall;Other (comment) Recall of Precautions/Restrictions: Impaired Precaution/Restrictions Comments: history of dementia Restrictions Weight Bearing Restrictions Per Provider Order: No     Mobility  Bed Mobility Overal bed mobility: Needs Assistance Bed Mobility: Supine to Sit     Supine to sit: Supervision, HOB elevated, Used rails     General bed mobility comments: Incr time    Transfers Overall transfer level: Needs assistance Equipment used: Rolling walker (2  wheels) Transfers: Sit to/from Stand Sit to Stand: Mod assist, Min assist, Contact guard assist           General transfer comment: multiple sit<>stands from EOB; much improved hand placement and sequencing, pushed up from bed without need for cueing CGA-modA for trunk elevation and stability    Ambulation/Gait Ambulation/Gait assistance: Min assist, +2 safety/equipment (chair follow) Gait Distance (Feet): 75 Feet Assistive device: Rolling walker (2 wheels) Gait Pattern/deviations: Decreased step length - right, Decreased step length - left, Knee flexed in stance - right, Knee flexed in stance - left, Trunk flexed Gait velocity: Decreased     General Gait Details: Cues for posture, and for gaze direction forward; much less posterior lean; tending to drift L with incr amb distance   Stairs             Wheelchair Mobility     Tilt Bed    Modified Rankin (Stroke Patients Only)       Balance     Sitting balance-Leahy Scale: Fair       Standing balance-Leahy Scale: Poor                              Communication Communication Communication: Impaired Factors Affecting Communication: Other (comment) (improved speech though still mumbled/difficult to understand sometimes)  Cognition Arousal: Alert Behavior During Therapy: WFL for tasks assessed/performed (More engaged)   PT - Cognitive impairments: Orientation, Memory, Attention, Problem solving, Initiation, Safety/Judgement, Awareness, History of cognitive impairments, Sequencing   Orientation impairments: Time, Situation                     Following commands: Impaired Following commands impaired: Only follows one  step commands consistently    Cueing Cueing Techniques: Verbal cues, Tactile cues, Gestural cues  Exercises      General Comments General comments (skin integrity, edema, etc.): Wife present and supportive      Pertinent Vitals/Pain Pain Assessment Pain Assessment:  Faces Pain Location: grimace with condom cath change Pain Descriptors / Indicators: Grimacing Pain Intervention(s): Monitored during session, Repositioned    Home Living                          Prior Function            PT Goals (current goals can now be found in the care plan section) Acute Rehab PT Goals Patient Stated Goal: Wants to get home PT Goal Formulation: With patient/family Time For Goal Achievement: 04/28/24 Potential to Achieve Goals: Good Progress towards PT goals: Progressing toward goals    Frequency    Min 2X/week      PT Plan      Co-evaluation              AM-PAC PT 6 Clicks Mobility   Outcome Measure  Help needed turning from your back to your side while in a flat bed without using bedrails?: A Little Help needed moving from lying on your back to sitting on the side of a flat bed without using bedrails?: A Little Help needed moving to and from a bed to a chair (including a wheelchair)?: A Little Help needed standing up from a chair using your arms (e.g., wheelchair or bedside chair)?: A Lot Help needed to walk in hospital room?: A Lot Help needed climbing 3-5 steps with a railing? : A Lot 6 Click Score: 15    End of Session Equipment Utilized During Treatment: Gait belt Activity Tolerance: Patient tolerated treatment well Patient left: in chair;with call bell/phone within reach;with chair alarm set Nurse Communication: Mobility status;Need for lift equipment PT Visit Diagnosis: Unsteadiness on feet (R26.81);Other abnormalities of gait and mobility (R26.89);Muscle weakness (generalized) (M62.81)     Time: 8958-8887 PT Time Calculation (min) (ACUTE ONLY): 31 min  Charges:    $Gait Training: 8-22 mins $Therapeutic Activity: 8-22 mins PT General Charges $$ ACUTE PT VISIT: 1 Visit                     Silvano Currier, PT  Acute Rehabilitation Services Office [phone] Secure Chat welcomed    Silvano VEAR Currier 04/18/2024, 11:52 AM

## 2024-04-18 NOTE — Progress Notes (Signed)
 Occupational Therapy Treatment Patient Details Name: Erik [name] MRN: [id number] DOB: [birth year] Today's Date: 04/18/2024   History of present illness 83 y.o. male admitted 04/13/24 for metabolic encephalopathy, dehydration, possible UTI. PMH includes Parkinsons, dementia, CVA, HTN, CAD, a fib on Eliquis , BPH, COPD, TIA, bradycardia, alcohol  use, recurrent UTIs, chronic back pain.   OT comments  Pt progressing well towards goals. Improved to complete LB dressing with mod assist, and improved activity tolerance with mobility. Pt continues to be limited by decreased strength and balance requiring mod assist to stand and CGA for balance. Pt would benefit from <3 hours of skilled rehab daily, however pt and family reporting strong preference for home. Recommending HHOT and max HH services to increase independence and reduce caregiver burden. Will continue to follow acutely.       If plan is discharge home, recommend the following:  Two people to help with walking and/or transfers;A lot of help with bathing/dressing/bathroom;Assistance with feeding;Direct supervision/assist for medications management;Direct supervision/assist for financial management;Assist for transportation;Help with stairs or ramp for entrance   Equipment Recommendations  None recommended by OT       Precautions / Restrictions Precautions Precautions: Fall;Other (comment) Recall of Precautions/Restrictions: Impaired Restrictions Weight Bearing Restrictions Per Provider Order: No       Mobility Bed Mobility Overal bed mobility: Needs Assistance Bed Mobility: Sit to Supine     Supine to sit: Supervision, HOB elevated, Used rails     General bed mobility comments: S for safety, increased time    Transfers Overall transfer level: Needs assistance Equipment used: Rolling walker (2 wheels) Transfers: Sit to/from Stand Sit to Stand: Mod assist           General transfer comment: STS mod assist, once in  standing CGA for mobility     Balance Overall balance assessment: Needs assistance Sitting-balance support: Feet supported Sitting balance-Leahy Scale: Fair     Standing balance support: Bilateral upper extremity supported, Reliant on assistive device for balance Standing balance-Leahy Scale: Poor Standing balance comment: reliant on RW           ADL either performed or assessed with clinical judgement   ADL Overall ADL's : Needs assistance/impaired       Lower Body Dressing: Maximal assistance;Sit to/from stand   Toilet Transfer: Moderate assistance;Ambulation;Rolling walker (2 wheels) Toilet Transfer Details (indicate cue type and reason): assist for shoes         Functional mobility during ADLs: Moderate assistance;Rolling walker (2 wheels) General ADL Comments: Per wife, pt appears to be at baseline. At baseline, pt receives assistance for all ADLs including toileting    Extremity/Trunk Assessment Upper Extremity Assessment Upper Extremity Assessment: Generalized weakness   Lower Extremity Assessment Lower Extremity Assessment: Defer to PT evaluation        Vision   Vision Assessment?: No apparent visual deficits         Communication Communication Communication: Impaired Factors Affecting Communication: Reduced clarity of speech   Cognition Arousal: Alert Behavior During Therapy: WFL for tasks assessed/performed Cognition: History of cognitive impairments             OT - Cognition Comments: Wife present reporting cog at baseline                 Following commands: Impaired Following commands impaired: Only follows one step commands consistently      Cueing   Cueing Techniques: Verbal cues, Tactile cues, Gestural cues        General Comments  Wife present and supportive    Pertinent Vitals/ Pain       Pain Assessment Pain Assessment: Faces Faces Pain Scale: Hurts a little bit Pain Location: back and bottom Pain Descriptors /  Indicators: Grimacing Pain Intervention(s): Limited activity within patient's tolerance   Frequency  Min 2X/week        Progress Toward Goals  OT Goals(current goals can now be found in the care plan section)  Progress towards OT goals: Progressing toward goals  Acute Rehab OT Goals Patient Stated Goal: To go home OT Goal Formulation: With patient/family Time For Goal Achievement: 04/28/24 Potential to Achieve Goals: Good ADL Goals Pt Will Perform Eating: with set-up;sitting Pt Will Perform Lower Body Bathing: with mod assist;sit to/from stand Pt Will Transfer to Toilet: with min assist;ambulating Additional ADL Goal #1: Bed mobility with min A in preparation for ADL tasks  Plan         AM-PAC OT 6 Clicks Daily Activity     Outcome Measure   Help from another person eating meals?: A Little Help from another person taking care of personal grooming?: A Little Help from another person toileting, which includes using toliet, bedpan, or urinal?: Total Help from another person bathing (including washing, rinsing, drying)?: A Lot Help from another person to put on and taking off regular upper body clothing?: A Lot Help from another person to put on and taking off regular lower body clothing?: A Lot 6 Click Score: 13    End of Session Equipment Utilized During Treatment: Gait belt;Rolling walker (2 wheels)  OT Visit Diagnosis: Unsteadiness on feet (R26.81);Other abnormalities of gait and mobility (R26.89);Muscle weakness (generalized) (M62.81);History of falling (Z91.81);Other symptoms and signs involving the nervous system (R29.898);Other symptoms and signs involving cognitive function   Activity Tolerance Patient tolerated treatment well   Patient Left in bed;with call bell/phone within reach;with bed alarm set;with nursing/sitter in room;with family/visitor present   Nurse Communication Mobility status        Time: 1451-1511 OT Time Calculation (min): 20  min  Charges: OT General Charges $OT Visit: 1 Visit OT Treatments $Self Care/Home Management : 8-22 mins  Adrianne BROCKS, OT  Acute Rehabilitation Services Office [phone] Secure chat preferred   Adrianne GORMAN Savers 04/18/2024, 4:08 PM

## 2024-04-19 DIAGNOSIS — G9341 Metabolic encephalopathy: Secondary | ICD-10-CM | POA: Diagnosis not present

## 2024-04-19 LAB — CBC
HCT: 44.5 % (ref 39.0–52.0)
Hemoglobin: 14.9 g/dL (ref 13.0–17.0)
MCH: 32 pg (ref 26.0–34.0)
MCHC: 33.5 g/dL (ref 30.0–36.0)
MCV: 95.7 fL (ref 80.0–100.0)
Platelets: 237 K/uL (ref 150–400)
RBC: 4.65 MIL/uL (ref 4.22–5.81)
RDW: 13.2 % (ref 11.5–15.5)
WBC: 8.9 K/uL (ref 4.0–10.5)
nRBC: 0 % (ref 0.0–0.2)

## 2024-04-19 LAB — BASIC METABOLIC PANEL WITH GFR
Anion gap: 10 (ref 5–15)
BUN: 15 mg/dL (ref 8–23)
CO2: 25 mmol/L (ref 22–32)
Calcium: 8.9 mg/dL (ref 8.9–10.3)
Chloride: 104 mmol/L (ref 98–111)
Creatinine, Ser: 0.98 mg/dL (ref 0.61–1.24)
GFR, Estimated: >60 mL/min (ref >60)
Glucose, Bld: 97 mg/dL (ref 70–99)
Potassium: 3.7 mmol/L (ref 3.5–5.1)
Sodium: 139 mmol/L (ref 135–145)

## 2024-04-19 NOTE — Progress Notes (Signed)
 Physical Therapy Treatment Patient Details Name: Erik [name] MRN: [id number] DOB: September 13, 1940 Today's Date: 04/19/2024   History of Present Illness 83 y.o. male admitted 04/13/24 for metabolic encephalopathy, dehydration, possible UTI. PMH includes Parkinsons, dementia, CVA, HTN, CAD, a fib on Eliquis , BPH, COPD, TIA, bradycardia, alcohol  use, recurrent UTIs, chronic back pain.    PT Comments  PTA arrived and wife reports WC is on the way. WC arrived and PTA reviewed and instructed wife on how to operate WC and remove and replace parts as needed.  Pt performed transfer and wife able to demonstrate use of WC and teach back technique to PTA.      If plan is discharge home, recommend the following: A lot of help with walking and/or transfers;A lot of help with bathing/dressing/bathroom;Assistance with cooking/housework;Assist for transportation;Help with stairs or ramp for entrance   Can travel by private vehicle     Yes  Equipment Recommendations  Wheelchair (measurements PT);Wheelchair cushion (measurements PT)    Recommendations for Other Services       Precautions / Restrictions Precautions Recall of Precautions/Restrictions: Impaired Precaution/Restrictions Comments: history of dementia Restrictions Weight Bearing Restrictions Per Provider Order: No     Mobility  Bed Mobility Overal bed mobility: Needs Assistance Bed Mobility: Supine to Sit     Supine to sit: Supervision, HOB elevated, Used rails     General bed mobility comments: S for safety, increased time, tactile cues to move R hip forward to prepare for transfer OOB to WC.    Transfers Overall transfer level: Needs assistance Equipment used: Rolling walker (2 wheels) Transfers: Sit to/from Stand Sit to Stand: Contact guard assist           General transfer comment: Cues for hand placement to and from seated surface with hand over hand assistance to reach back for WC.     Ambulation/Gait Ambulation/Gait assistance:  (deferred as he was transferring to [facility] to d/c home.)                 Psychologist, Counselling mobility: Yes Wheelchair parts: Supervision/cueing (to wife) Mudlogger Details (indicate cue type and reason): reviewed parts and how to operate including WC brakes, armrests, folding chair, removing Leg rests and raising and lowering leg rests, and lengthening leg rests.  Wife was able to demonstrate and teach back method to PTA.   Tilt Bed    Modified Rankin (Stroke Patients Only)       Balance Overall balance assessment: Needs assistance Sitting-balance support: Feet supported Sitting balance-Leahy Scale: Fair       Standing balance-Leahy Scale: Poor Standing balance comment: reliant on RW                            Communication    Cognition Arousal: Alert Behavior During Therapy: WFL for tasks assessed/performed   PT - Cognitive impairments: Orientation, Memory, Attention, Problem solving, Initiation, Safety/Judgement, Awareness, History of cognitive impairments, Sequencing                       PT - Cognition Comments: h/o dementia, Parkinson's, CVA. wife repeatedly states, When his mind is like this it's because he has a UTI        Cueing    Exercises      General Comments        Pertinent Vitals/Pain  Pain Assessment Pain Assessment: Faces Faces Pain Scale: No hurt    Home Living                          Prior Function            PT Goals (current goals can now be found in the care plan section) Acute Rehab PT Goals Patient Stated Goal: Wants to get home Potential to Achieve Goals: Good Progress towards PT goals: Progressing toward goals    Frequency    Min 2X/week      PT Plan      Co-evaluation              AM-PAC PT 6 Clicks Mobility   Outcome Measure  Help needed turning from  your back to your side while in a flat bed without using bedrails?: A Little Help needed moving from lying on your back to sitting on the side of a flat bed without using bedrails?: A Little Help needed moving to and from a bed to a chair (including a wheelchair)?: A Little Help needed standing up from a chair using your arms (e.g., wheelchair or bedside chair)?: A Little Help needed to walk in hospital room?: A Lot Help needed climbing 3-5 steps with a railing? : A Lot 6 Click Score: 16    End of Session Equipment Utilized During Treatment: Gait belt Activity Tolerance: Patient tolerated treatment well Patient left: in chair;with family/visitor present (in [facility]) Nurse Communication: Mobility status PT Visit Diagnosis: Unsteadiness on feet (R26.81);Other abnormalities of gait and mobility (R26.89);Muscle weakness (generalized) (M62.81)     Time: 8773-8759 PT Time Calculation (min) (ACUTE ONLY): 14 min  Charges:    $Wheel Chair Management: 8-22 mins PT General Charges $$ ACUTE PT VISIT: 1 Visit                     Toya HAMS , PTA Acute Rehabilitation Services Office [phone]    Toya JINNY Gosling 04/19/2024, 12:48 PM

## 2024-04-19 NOTE — Progress Notes (Addendum)
 [facility] Brief Progress Note   _____________________________________________________________________________________________________________  Patient Name: Erik [name] Patient DOB: 09-25-1940 Date: 04/19/2024     Data: Patient has order for DC today. Recv'd message from DC Nurse [facility] LPN regarding order for patient's wheelchair. Wheelchair recently ordered through [employer] but was out of stock. Then order through [employer] by [facility] and order accepted.     Action: Reached out to [employer] ([phone]) regarding order. Requested ETA. [employer] to callback with ETA.    Response:  Updated Bernetta LPN and Advertising Account Executive via [employer]. Awaiting callback from [employer].   ADDENDUM 1232: Wheelchair delivered to pt room.  _____________________________________________________________________________________________________________  The [facility] [employer] Breasia Karges Please contact us  directly via secure chat (search for [facility]) or by calling us  at [phone] [facility]).

## 2024-04-19 NOTE — Progress Notes (Signed)
 Reviewed AVS, patient expressed understanding of medications, MD follow up reviewed.   Removed IV, Site clean, dry and intact.  See LDA for information on wounds at discharge. Patient states all belongings brought to the hospital at time of admission are accounted for and packed to take home. Currently waiting on DME delivery. Then, nursing staff will be contacted to transport patient to  entrance C, where family member was waiting in vehicle to transport home.

## 2024-04-19 NOTE — Progress Notes (Signed)
 Patient is awaiting wheelchair delivery.

## 2024-04-19 NOTE — TOC Progression Note (Addendum)
 Transition of Care [facility]) - Progression Note    Patient Details  Name: Erik [name] MRN: [id number] Date of Birth: 06-23-1940  Transition of [facility]) CM/SW Contact  Robynn Eileen Hoose, RN Phone Number: 04/19/2024, 9:41 AM  Clinical Narrative:   Spoke with provider regarding discharge needs.  Patient needs HH services PT OT HHA and wheelchair for DME.  Spoke with Wife, [facility], they have worked with them in the past.  Referral sent to [place] with [facility], awaiting response.  DME ordered through Jermaine with [employer], awaiting for response.  9050: Per Jermaine with [employer], unable to supply wheelchair due to being out of stock. Order sent to [employer] through Portal.  1015: Burnard with [facility] confirmed able to accept patient for [facility] services. At this time, PT staff is able to perform OT services. Contact information placed on AVS.  Phone call to [employer] to confirm receipt of order in [facility] hub, order received and they are working on processing it.   1140: Per floor nurse, DME has not been received at this time.  1226: DME still has not arrived. Patient and family requesting equipment be delivered to their home, they are ready to go home. Call to [employer] to inform of same. Per Rep order status is pending delivery.  1233: Floor nurse reports Wheelchair arrived to bedside.                     Expected Discharge Plan and Services                                               Social Drivers of Health (SDOH) Interventions SDOH Screenings   Food Insecurity: No Food Insecurity (04/14/2024)  Housing: Low Risk  (04/14/2024)  Transportation Needs: No Transportation Needs (04/14/2024)  Utilities: Not At Risk (04/14/2024)  Social Connections: Moderately Isolated (04/14/2024)  Tobacco Use: Medium Risk (04/13/2024)    Readmission Risk Interventions    10/31/2023    1:45 PM  Readmission Risk Prevention Plan  Post Dischage Appt Complete  Medication  Screening Complete  Transportation Screening Complete

## 2024-04-19 NOTE — Discharge Summary (Signed)
 Physician Discharge Summary   Patient: Erik [name] MRN: [id number] DOB: 1941/02/18  Admit date:     04/13/2024  Discharge date: 04/19/24  Discharge Physician: Lonni SHAUNNA Dalton   PCP: Leonel Cole, MD     Recommendations at discharge:  Follow up with PCP Dr. Leonel in 1 week Dr. Leonel: Please check BP and resume amlodipine  or remove from med list if appropriate  Follow up with Cardiology Dr. Waddell for bradycardia within next month re: bradycardia     Discharge Diagnoses: Principal Problem:   Acute metabolic encephalopathy Active Problems:   Essential hypertension   CAD (coronary artery disease)   Dementia (HCC)   Generalized weakness   Dehydration   Parkinson's disease   Chronic atrial fibrillation   Bradycardia   Cerebrovascular disease   COPD    History of recurrent UTIs       Hospital Course: 83 y.o. M with mild dementia, lives at home, Parkinson's disease, chronic A-fib/flutter on Eliquis , CAD, HTN, HLD, prior stroke, COPD who presented with decreased responsiveness, generalized weakness.       Acute metabolic encephalopathy due to delirium Dehydration In the ER, COVID/flu/RSV negative.  Electrolytes and blood counts unremarkable.  Chest x-ray clear.  Urinalysis suggested UTI so he was started on IV fluids and antibiotics.  Wife had recently had a cold.  And reported that the patient had not been eating or drinking well for several days, with increasing generalized weakness and confusion.    In the hospital, blood cultures no growth, urine culture insignificant growth.  UTI ruled out.  Suspect that he had a URI that caused his mild acute metabolic encephalopathy.  This resolved by the time of discharge.       Parkinson disease Dementia Return to baseline by the time of discharge.  Worked with physical therapy and was cleared for discharge home with home health.    Chronic atrial fibrillation Bradycardia Patient was monitored on telemetry  in the hospital.  He had rated cardia mostly in the 40s and 50s, and occasional pauses of 2 to 3 seconds.  These were asymptomatic.  Discussed holding metoprolol  at discharge with family, given previous instructions by Dr. Waddell, they will continue metoprolol  for now.  Recommend close follow-up with EP.      History of stroke Hypertension Stable on Eliquis , no stroke symptoms here.   COPD No evidence of flare    UTI ruled out Recurrent UTI Urine culture no growth             The [place]  Controlled Substances Registry was reviewed for this patient prior to discharge.   Disposition: Home health, wheelchair   DISCHARGE MEDICATION: Allergies as of 04/19/2024       Reactions   Albuterol  Sulfate [albuterol ] Other (See Comments)   rapid heartbeat; afib   Hyoscyamine Sulfate    Other reaction(s): urinary retention   Adhesive [tape] Rash, Other (See Comments)   New heart monitor/patch (adhesive) broke out the skin and left a large red patch (still visible after 3 weeks   Warfarin And Related Other (See Comments)   Patient's INR could never get regulated while on this        Medication List     PAUSE taking these medications    amLODipine  5 MG tablet Wait to take this until your doctor or other care provider tells you to start again. Commonly known as: NORVASC  Take 1 tablet (5 mg total) by mouth daily as needed (for systolic blood pressure  more than 160).   metoprolol  succinate 25 MG 24 hr tablet Wait to take this until your doctor or other care provider tells you to start again. Commonly known as: TOPROL -XL Take 1 tablet (25 mg total) by mouth daily. What changed: when to take this       TAKE these medications    acetaminophen  500 MG tablet Commonly known as: TYLENOL  Take 500-1,000 mg by mouth every 6 (six) hours as needed (pain).   carbidopa -levodopa  25-100 MG tablet Commonly known as: SINEMET  IR Take 1 tablet three times daily.   digoxin  0.125  MG tablet Commonly known as: LANOXIN  TAKE 1 TABLET BY MOUTH DAILY. EXCEPT ON SUNDAY   donepezil  10 MG tablet Commonly known as: ARICEPT  Take 10 mg by mouth at bedtime.   Eliquis  5 MG Tabs tablet Generic drug: apixaban  TAKE 1 TABLET BY MOUTH TWICE A DAY   FIBER PO Take 1 capsule by mouth daily.   finasteride  5 MG tablet Commonly known as: PROSCAR  Take 5 mg by mouth at bedtime.   Incruse Ellipta  62.5 MCG/INH Aepb Generic drug: umeclidinium bromide  Take 1 puff by mouth daily.   ipratropium 0.03 % nasal spray Commonly known as: ATROVENT  Place 2 sprays into the nose 3 (three) times daily.   memantine  10 MG tablet Commonly known as: NAMENDA  Take one tablet twice a day   nitrofurantoin  (macrocrystal-monohydrate) 100 MG capsule Commonly known as: MACROBID  Take 100 mg by mouth daily.   nitroGLYCERIN  0.4 MG SL tablet Commonly known as: Nitrostat  Place 1 tablet (0.4 mg total) under the tongue every 5 (five) minutes as needed for chest pain (MAX 3 TABLETS).   polyethylene glycol 17 g packet Commonly known as: MIRALAX  / GLYCOLAX  Take 17 g by mouth daily as needed for mild constipation.   PRESERVISION AREDS 2+MULTI VIT PO Take 1 capsule by mouth in the morning and at bedtime.   tamsulosin  0.4 MG Caps capsule Commonly known as: FLOMAX  Take 1 capsule (0.4 mg total) by mouth daily after breakfast.   Theratears 0.25 % Soln Generic drug: Carboxymethylcellulose Sodium Place 1 drop into both eyes See admin instructions. 2-4 times daily   traZODone  50 MG tablet Commonly known as: DESYREL  Take 1/2 tab po qhs   Vitamin D3 50 MCG (2000 UT) Tabs Take 2,000 Units by mouth in the morning.               Durable Medical Equipment  (From admission, onward)           Start     Ordered   04/19/24 [phone]  For home use only DME lightweight manual wheelchair with seat cushion  Once       Comments: Patient suffers from CVA which impairs their ability to perform daily activities  like bathing, dressing, feeding, grooming, and toileting in the home.  A cane, crutch, or walker will not resolve  issue with performing activities of daily living. A wheelchair will allow patient to safely perform daily activities. Patient is not able to propel themselves in the home using a standard weight wheelchair due to arm weakness, endurance, and general weakness. Patient can self propel in the lightweight wheelchair. Length of need Lifetime. Accessories: elevating leg rests (ELRs), wheel locks, extensions and anti-tippers.   04/19/24 9046            Follow-up Information     Leonel Cole, MD. Schedule an appointment as soon as possible for a visit in 1 week(s).   Specialty: Family Medicine Contact information:  [address] [place] [address] [phone]         [facility] Follow up.   Specialty: Home Health Services Why: Physical theraoy, Occupational therapy, Home health aide. Office will call to arrange follow up after hospital discharge. Contact information: [address] [place] [address] [phone]         Waddell Danelle ORN, MD Follow up.   Specialty: Cardiology Contact information: [address] [place] [address] [phone]                 Discharge Instructions     Discharge instructions   Complete by: As directed    **IMPORTANT DISCHARGE INSTRUCTIONS**   From Dr. Jonel: You were evaluated for weakness and a change in behavior.  Here, we found that you did not have COVID, RSV or the flu, but we did not test for other respiratory viruses that could cause similar symptoms.  Your blood and urine cultures had no significant growth  Your chest x-ray showed no signs of pneumonia, and your thyroid  test, liver tests and B12 levels (all of which can be causes of reversible confusion) showed these were not the issue.  I suspect this was dehydration, maybe a mild respiratory infection or  something similar.  Thankfully it is passed.  Resume all your home medicines (EXCEPT metoprolol  and Amlodipine , schedule an appointment with your cardiologist to discuss if/when you should resume them)   Drink plenty of fluids   Increase activity slowly   Complete by: As directed        Discharge Exam: Filed Weights   04/13/24 1833  Weight: 77 kg    General: Pt is alert, awake, not in acute distress Cardiovascular: Slow, irregular, nl S1-S2, no murmurs appreciated.   No LE edema.   Respiratory: Normal respiratory rate and rhythm.  CTAB without rales or wheezes. Abdominal: Abdomen soft and non-tender.  No distension or HSM.   Neuro/Psych: Strength symmetric in upper and lower extremities.  Judgment and insight appear impaired but at baseline, no resting tremor, generalized weakness.   Condition at discharge: fair  The results of significant diagnostics from this hospitalization (including imaging, microbiology, ancillary and laboratory) are listed below for reference.   Imaging Studies: CT ABDOMEN PELVIS W CONTRAST Result Date: 04/13/2024 EXAM: CT ABDOMEN AND PELVIS WITH CONTRAST 04/13/2024 09:03:14 PM TECHNIQUE: CT of the abdomen and pelvis was performed with the administration of 75 mL of iohexol  (OMNIPAQUE ) 350 MG/ML injection. Multiplanar reformatted images are provided for review. Automated exposure control, iterative reconstruction, and/or weight-based adjustment of the mA/kV was utilized to reduce the radiation dose to as low as reasonably achievable. COMPARISON: Comparison study 09/22/2023. CLINICAL HISTORY: generalized abd pain FINDINGS: LOWER CHEST: Lung bases are free of acute infiltrate or sizable effusion. No parenchymal nodules are noted. LIVER: The liver is within normal limits. GALLBLADDER AND BILE DUCTS: The gallbladder is within normal limits. No biliary ductal dilatation. SPLEEN: The spleen is unremarkable. PANCREAS: The pancreas is unremarkable. ADRENAL GLANDS: The  adrenal glands are unremarkable. KIDNEYS, URETERS AND BLADDER: The kidneys are well visualized bilaterally. No focal abnormality is noted. No obstructive changes are seen. The bladder is decompressed. No stones in the kidneys or ureters. GI AND BOWEL: A small hiatal hernia is seen. Stomach demonstrates no acute abnormality. Scattered diverticular changes of the colon are noted without inflammatory change. The small bowel is unremarkable. The appendix is air-filled and within normal limits. There is no bowel obstruction.  PERITONEUM AND RETROPERITONEUM: No ascites. No free air. VASCULATURE: Aortic calcifications are seen without aneurysmal dilatation. LYMPH NODES: No lymphadenopathy. REPRODUCTIVE ORGANS: The prostate is enlarged in size but stable. BONES AND SOFT TISSUES: Gluteal lipoma is noted on the right stable from the prior exam. No acute osseous abnormality. No focal soft tissue abnormality. IMPRESSION: 1. Chronic changes as described above without acute abnormality. Electronically signed by: Oneil Devonshire MD 04/13/2024 09:29 PM EST RP Workstation: [id number]   CT Head Wo Contrast Result Date: 04/13/2024 EXAM: CT HEAD WITHOUT CONTRAST 04/13/2024 07:42:47 PM TECHNIQUE: CT of the head was performed without the administration of intravenous contrast. Automated exposure control, iterative reconstruction, and/or weight based adjustment of the mA/kV was utilized to reduce the radiation dose to as low as reasonably achievable. COMPARISON: 10/27/2023 CLINICAL HISTORY: acute delirium approx 3 pm today FINDINGS: BRAIN AND VENTRICLES: No acute hemorrhage. No evidence of acute infarct. No extra-axial collection. No mass effect or midline shift. ORBITS: No acute abnormality. SINUSES: No acute abnormality. SOFT TISSUES AND SKULL: No acute soft tissue abnormality. No skull fracture. IMPRESSION: 1. No acute intracranial abnormality. Electronically signed by: Oneil Devonshire MD 04/13/2024 07:46 PM EST RP Workstation: [id number]    DG Chest Port 1 View Result Date: 04/13/2024 EXAM: 1 AP VIEW XRAY OF THE CHEST 04/13/2024 07:09:00 PM COMPARISON: 11/21/2023. CLINICAL HISTORY: Questionable sepsis - evaluate for abnormality FINDINGS: LUNGS AND PLEURA: Bibasilar atelectasis. No pleural effusion. No pneumothorax. HEART AND MEDIASTINUM: No acute abnormality of the cardiac and mediastinal silhouettes. BONES AND SOFT TISSUES: No acute osseous abnormality. IMPRESSION: 1. No active cardiopulmonary process identified. 2. Bibasilar atelectasis. Electronically signed by: Franky Crease MD 04/13/2024 07:13 PM EST RP Workstation: [id number]    Microbiology: Results for orders placed or performed during the hospital encounter of 04/13/24  Blood Culture (routine x 2)     Status: None   Collection Time: 04/13/24  6:43 PM   Specimen: BLOOD  Result Value Ref Range Status   Specimen Description BLOOD SITE NOT SPECIFIED  Final   Special Requests   Final    BOTTLES DRAWN AEROBIC AND ANAEROBIC Blood Culture results may not be optimal due to an inadequate volume of blood received in culture bottles   Culture   Final    NO GROWTH 5 DAYS Performed at [facility], [address] [place], [place] [address]    Report Status 04/18/2024 FINAL  Final  Blood Culture (routine x 2)     Status: None   Collection Time: 04/13/24  8:47 PM   Specimen: BLOOD  Result Value Ref Range Status   Specimen Description BLOOD SITE NOT SPECIFIED  Final   Special Requests   Final    BOTTLES DRAWN AEROBIC ONLY Blood Culture results may not be optimal due to an inadequate volume of blood received in culture bottles   Culture   Final    NO GROWTH 5 DAYS Performed at [facility], [address] [place], [place] [address]    Report Status 04/18/2024 FINAL  Final  Resp panel by RT-PCR (RSV, Flu A&B, Covid) Anterior Nasal Swab     Status: None   Collection Time: 04/13/24  8:54 PM   Specimen: Anterior Nasal Swab  Result Value Ref Range Status   SARS  Coronavirus 2 by RT PCR NEGATIVE NEGATIVE Final   Influenza A by PCR NEGATIVE NEGATIVE Final   Influenza B by PCR NEGATIVE NEGATIVE Final    Comment: (NOTE) The Xpert Xpress SARS-CoV-2/FLU/RSV plus assay is  intended as an aid in the diagnosis of influenza from Nasopharyngeal swab specimens and should not be used as a sole basis for treatment. Nasal washings and aspirates are unacceptable for Xpert Xpress SARS-CoV-2/FLU/RSV testing.  Fact Sheet for Patients: bloggercourse.com  Fact Sheet for Healthcare Providers: seriousbroker.it  This test is not yet approved or cleared by the [place]  FDA and has been authorized for detection and/or diagnosis of SARS-CoV-2 by FDA under an Emergency Use Authorization (EUA). This EUA will remain in effect (meaning this test can be used) for the duration of the COVID-19 declaration under Section 564(b)(1) of the Act, 21 U.S.C. section 360bbb-3(b)(1), unless the authorization is terminated or revoked.     Resp Syncytial Virus by PCR NEGATIVE NEGATIVE Final    Comment: (NOTE) Fact Sheet for Patients: bloggercourse.com  Fact Sheet for Healthcare Providers: seriousbroker.it  This test is not yet approved or cleared by the [place]  FDA and has been authorized for detection and/or diagnosis of SARS-CoV-2 by FDA under an Emergency Use Authorization (EUA). This EUA will remain in effect (meaning this test can be used) for the duration of the COVID-19 declaration under Section 564(b)(1) of the Act, 21 U.S.C. section 360bbb-3(b)(1), unless the authorization is terminated or revoked.  Performed at [facility], [address] [place], [place] [address]   Urine Culture (for pregnant, neutropenic or urologic patients or patients with an indwelling urinary catheter)     Status: Abnormal   Collection Time: 04/14/24 12:47 AM   Specimen:  Urine, Clean Catch  Result Value Ref Range Status   Specimen Description URINE, CLEAN CATCH  Final   Special Requests NONE  Final   Culture (A)  Final    <10,000 COLONIES/mL INSIGNIFICANT GROWTH Performed at [facility], [address] [place], [place] [address]    Report Status 04/15/2024 FINAL  Final  C Difficile Quick Screen w PCR reflex     Status: None   Collection Time: 04/14/24  4:45 PM   Specimen: STOOL  Result Value Ref Range Status   C Diff antigen NEGATIVE NEGATIVE Final   C Diff toxin NEGATIVE NEGATIVE Final   C Diff interpretation No C. difficile detected.  Final    Comment: Performed at [facility], [address], [place], [place] [address]    Labs: CBC: Recent Labs  Lab 04/13/24 1900 04/14/24 0200 04/15/24 0359 04/16/24 0231 04/17/24 0427 04/18/24 0311 04/19/24 0441  WBC 12.1*   < > 8.1 9.7 6.8 7.4 8.9  NEUTROABS 11.3*  --   --   --   --   --   --   HGB 16.6   < > 15.6 15.0 14.8 15.1 14.9  HCT 50.6   < > 47.8 44.9 44.8 44.9 44.5  MCV 97.5   < > 96.6 95.1 96.1 95.1 95.7  PLT 204   < > 186 201 206 231 237   < > = values in this interval not displayed.   Basic Metabolic Panel: Recent Labs  Lab 04/15/24 0359 04/16/24 0231 04/17/24 0427 04/18/24 0311 04/19/24 0441  NA 139 138 141 141 139  K 3.7 3.4* 3.7 3.8 3.7  CL 103 103 107 106 104  CO2 25 23 25 25 25   GLUCOSE 80 93 97 97 97  BUN 13 15 13 12 15   CREATININE 0.99 0.97 0.97 0.94 0.98  CALCIUM 8.7* 8.6* 8.7* 8.9 8.9  MG 1.7 1.9  --   --   --  Liver Function Tests: Recent Labs  Lab 04/13/24 1900 04/15/24 0359 04/16/24 0231  AST 16 16 16   ALT 9 14 15   ALKPHOS 61 53 50  BILITOT 2.1* 1.1 1.0  PROT 7.1 6.1* 5.9*  ALBUMIN 3.4* 2.9* 2.8*   CBG: Recent Labs  Lab 04/16/24 0807  GLUCAP 104*    Discharge time spent: approximately 45 minutes spent on discharge counseling, evaluation of patient on day of discharge, and coordination of discharge planning with nursing, social  work, pharmacy and case management  Signed: Lonni SHAUNNA Dalton, MD [facility] 04/19/2024

## 2024-04-19 NOTE — Plan of Care (Signed)
  Problem: Education: Goal: Knowledge of disease and its progression will improve Outcome: Progressing   Problem: Nutritional: Goal: Ability to make healthy dietary choices will improve Outcome: Progressing   Problem: Clinical Measurements: Goal: Complications related to the disease process, condition or treatment will be avoided or minimized Outcome: Progressing   

## 2024-04-19 NOTE — Care Management (Signed)
    Durable Medical Equipment  (From admission, onward)           Start     Ordered   04/19/24 [phone]  For home use only DME lightweight manual wheelchair with seat cushion  Once       Comments: Patient suffers from CVA which impairs their ability to perform daily activities like bathing, dressing, feeding, grooming, and toileting in the home.  A cane, crutch, or walker will not resolve  issue with performing activities of daily living. A wheelchair will allow patient to safely perform daily activities. Patient is not able to propel themselves in the home using a standard weight wheelchair due to arm weakness, endurance, and general weakness. Patient can self propel in the lightweight wheelchair. Length of need Lifetime. Accessories: elevating leg rests (ELRs), wheel locks, extensions and anti-tippers.   04/19/24 9046           Per therapy recommendations

## 2024-04-21 ENCOUNTER — Telehealth: Payer: Self-pay | Admitting: Physician Assistant

## 2024-04-21 ENCOUNTER — Telehealth: Payer: Self-pay | Admitting: Internal Medicine

## 2024-04-21 NOTE — Telephone Encounter (Signed)
 Pt's wife called and stated that her husband (pt) was in the hospital last week , due to the Parkinson Disease and dehydration. Thanks

## 2024-04-21 NOTE — Telephone Encounter (Signed)
 Spoke with patient's significant other, [name] (OK per DPR). She would like to schedule appt with Dr. Waddell for patient's hospital follow-up.  No appts available with Dr. Waddell, offered next available appt with EP APP Jodie Passey, PA-C on 04/29/24 at 9:00 AM. Erik [name] accepted appt.  Erik [name] is requesting to schedule F/U appt with Dr. Almetta for January 2026. Attempted to schedule from recall, no openings available. Will forwarded to EP scheduler to assist [name] with appt.

## 2024-04-21 NOTE — Telephone Encounter (Signed)
 Pt significant other called in stating pt was told to see Dr. Waddell following hospital stay. She states he only wants to see Dr. Waddell and I'm not showing any openings right now. Please advise.

## 2024-04-25 NOTE — Telephone Encounter (Signed)
 Spoke w/ [name] - she was ok keeping the appointment with Jodie Passey, PA on 12/9 as patient needs to be seen since Dr. Almetta doesn't have any opening before now and then, and did not want to completely move the appointment to Dr. Garnette next opening 06/10/24. She did want to go ahead and schedule for Dr. Almetta for January - scheduled on 06/10/24. May need to move out Jan appt after seeing Jodie depending on his dispo at Tuesdays appt.

## 2024-04-27 NOTE — Telephone Encounter (Signed)
 Ok to continue trazedone for now.

## 2024-04-28 NOTE — Progress Notes (Unsigned)
  Electrophysiology Office Note:   Date:  04/29/2024  ID:  Erik [name], DOB 07/16/1940, MRN [id number]  Primary Cardiologist: None Electrophysiologist: Donnice DELENA Primus, MD   Electrophysiologist:  Donnice DELENA Primus, MD      History of Present Illness:   Erik [name] is a 83 y.o. male with h/o HTN And atrial fibrillation seen today for post hospital follow up.    Admitted 11/23 - 04/19/2024 with decreases responsiveness and generalized weakness. Felt to have acute metabolic encephalopathy due to delirium and dehydration post viral syndrome.  Pt noted to have occasional bradycardia in the hospital in the 40-50s with brief pauses 2-3 seconds, asymptomatic. Left on metoprolol  and encouraged to follow up with cards at discharge.  Since discharge from hospital the patient reports doing very well. No further symptoms or bradycardia at home. No chest pain, SOB, dizziness, lightheadedness, or syncope. Mental status has also been at baseline.   Review of systems complete and found to be negative unless listed in HPI.   EP Information / Studies Reviewed:    EKG is ordered today. Personal review as below.  EKG Interpretation Date/Time:  Tuesday April 29 2024 08:42:25 EST Ventricular Rate:  76 PR Interval:    QRS Duration:  78 QT Interval:  396 QTC Calculation: 445 R Axis:   -37  Text Interpretation: Atrial fibrillation Left axis deviation Confirmed by [name] [phone]) on 04/29/2024 8:49:13 AM    Arrhythmia/Device History Failed flecainide  Failed propafenone  Declined tikosyn/amio.   Physical Exam:   VS:  BP 108/68   Pulse 76   Ht 5' 11 (1.803 m)   Wt 217 lb (98.4 kg)   SpO2 96%   BMI 30.27 kg/m    Wt Readings from Last 3 Encounters:  04/29/24 217 lb (98.4 kg)  04/13/24 169 lb 12.1 oz (77 kg)  03/10/24 170 lb (77.1 kg)     GEN: No acute distress NECK: No JVD; No carotid bruits CARDIAC: Irregularly irregular rate and rhythm, no murmurs, rubs,  gallops RESPIRATORY:  Clear to auscultation without rales, wheezing or rhonchi  ABDOMEN: Soft, non-tender, non-distended EXTREMITIES:  Trace edema; No deformity   ASSESSMENT AND PLAN:    Permanent AF Secondary hypercoagulable state EKG today shows AF with controlled rates. Rates overall controlled, and recently low in setting of acute illness Reviewed at length today.  Very low threshold to stop digoxin  and decrease and eventually stop toprol  if he has SYMPTOMATIC bradycardia.  Continue eliquis  5 mg BID for CHA2DS2VASc of at least 6 No current indication for pacing.  HTN Stable on current regimen  Dementia Relatively stable on Namenda   Follow up with Dr. Primus in January to establish at pt and family request. Offered to extend follow up but with recent admission they prefer to go ahead and establish.  Did cancel AF clinic appt.   Signed, Sharper Prentice Lesia, PA-C

## 2024-04-29 ENCOUNTER — Ambulatory Visit: Attending: Student | Admitting: Student

## 2024-04-29 ENCOUNTER — Encounter: Payer: Self-pay | Admitting: Student

## 2024-04-29 VITALS — BP 108/68 | HR 76 | Ht 71.0 in | Wt 217.0 lb

## 2024-04-29 DIAGNOSIS — I4821 Permanent atrial fibrillation: Secondary | ICD-10-CM

## 2024-04-29 DIAGNOSIS — D6869 Other thrombophilia: Secondary | ICD-10-CM

## 2024-04-29 DIAGNOSIS — F0390 Unspecified dementia without behavioral disturbance: Secondary | ICD-10-CM

## 2024-04-29 DIAGNOSIS — I10 Essential (primary) hypertension: Secondary | ICD-10-CM

## 2024-04-29 NOTE — Patient Instructions (Addendum)
 Medication Instructions:  No medication changes today. *If you need a refill on your cardiac medications before your next appointment, please call your pharmacy*  Lab Work: No labwork ordered today. If you have labs (blood work) drawn today and your tests are completely normal, you will receive your results only by: MyChart Message (if you have MyChart) OR A paper copy in the mail If you have any lab test that is abnormal or we need to change your treatment, we will call you to review the results.  Testing/Procedures: No testing ordered today  Follow-Up: At [facility], you and your health needs are our priority.  As part of our continuing mission to provide you with exceptional heart care, our providers are all part of one team.  This team includes your primary Cardiologist (physician) and Advanced Practice Providers or APPs (Physician Assistants and Nurse Practitioners) who all work together to provide you with the care you need, when you need it.  Your next appointment:   As scheduled in January to establish from Dr. Waddell.  Provider:   You may see Donnice DELENA Primus, MD or one of the following Advanced Practice Providers on your designated Care Team:   Charlies Arthur, PA-C Anise Harbin Andy Domenique Southers, PA-C Suzann Riddle, NP Daphne Barrack, NP    We recommend signing up for the patient portal called MyChart.  Sign up information is provided on this After Visit Summary.  MyChart is used to connect with patients for Virtual Visits (Telemedicine).  Patients are able to view lab/test results, encounter notes, upcoming appointments, etc.  Non-urgent messages can be sent to your provider as well.   To learn more about what you can do with MyChart, go to [email or web address].

## 2024-04-30 NOTE — Telephone Encounter (Signed)
 I called the patient's emergency contact and let her know of Dr. Adrian suggestion about continuing the trazedone. She stated that when they were in the hospital in the past few days the pharmacist there told them not to take it anymore. She said she has not been giving it to the patient and he is sleeping better. I told her that I will let Dr. Waddell know and his nurse will communicate further instructions.

## 2024-05-04 NOTE — Telephone Encounter (Signed)
 I would defer decision about taking trazedone to his primary MD. I am no expert on meds for insomnia.

## 2024-05-06 ENCOUNTER — Ambulatory Visit (HOSPITAL_COMMUNITY): Admitting: Physician Assistant

## 2024-06-10 ENCOUNTER — Encounter: Payer: Self-pay | Admitting: Student in an Organized Health Care Education/Training Program

## 2024-06-10 ENCOUNTER — Ambulatory Visit
Attending: Student in an Organized Health Care Education/Training Program | Admitting: Student in an Organized Health Care Education/Training Program

## 2024-06-10 VITALS — BP 116/74 | HR 68 | Ht 71.0 in | Wt 217.0 lb

## 2024-06-10 DIAGNOSIS — I4819 Other persistent atrial fibrillation: Secondary | ICD-10-CM | POA: Diagnosis not present

## 2024-06-10 NOTE — Patient Instructions (Signed)

## 2024-06-10 NOTE — Progress Notes (Signed)
 " Cardiology Office Note   Date:  06/10/2024  ID:  Erik [name], DOB February 28, 1941, MRN [id number] PCP: Leonel Cole, MD  [facility] Providers Cardiologist:  None Electrophysiologist:  Donnice DELENA Primus, MD   History of Present Illness Erik [name] is a 84 y.o. male with Parkinson's disease, persistent AF and HTN who presents for arrhythmia management.   Discussed the use of AI scribe software for clinical note transcription with the patient, who gave verbal consent to proceed.  History of Present Illness Erik [name] is an 84 year old male with Parkinson's disease who presents with concerns about medication management and recent illness. He is accompanied by Erik [name].   He has been diagnosed with Parkinson's disease for several years, primarily causing weakness and tremors in his legs and arms, with his hands being particularly affected. He is on carbidopa , which helps manage his symptoms, and memantine  for cognitive issues related to his condition.  He has a history of recurrent urinary tract infections, having experienced three consecutive episodes that led to an MRI and a neurologist consultation about five years ago.  He is on Eliquis  for stroke prevention and digoxin , which was started after a COVID-19 infection that affected his heart and kidneys. The digoxin  was prescribed to manage fast heart rates, and he has not experienced significant issues with this medication.  He has a history of heart rhythm issues, including heart pauses noted during hospital stays. He sleeps a lot, and pauses are not considered concerning unless they occur while he is awake and alert.  Recently, he attended a funeral and subsequently felt unwell, suggesting he may have picked up a viral illness. He has not felt quite right since then, but specific symptoms were not detailed.  Erik [name] reports some cognitive decline, possibly related to dementia, complicating his understanding of his  condition. He is less inclined to go out, and Erik [name] prefers not to leave him alone for extended periods.  ROS: memory decline, tremors   Studies Reviewed  ECG review 04/29/24: AF/VR 76, QRS 78, QT/c 396/445 04/13/24: AF/VR 96, QRS 83, QT/c 354/448 04/13/23: AF/VR 61, QRS 82, QT/c 404/406 02/22/21: SB 58, PR 210, QRS 98, QT/c 450/441  Risk Assessment/Calculations  CHA2DS2-VASc Score = 6  This indicates a 9.7% annual risk of stroke. The patient's score is based upon: CHF History: 0 HTN History: 1 Diabetes History: 0 Stroke History: 2 Vascular Disease History: 1 Age Score: 2 Gender Score: 0  Physical Exam VS:  BP 116/74 (BP Location: Left Arm, Patient Position: Sitting, Cuff Size: Large)   Pulse 68   Ht 5' 11 (1.803 m)   Wt 217 lb (98.4 kg)   SpO2 95%   BMI 30.27 kg/m   Wt Readings from Last 3 Encounters:  06/10/24 217 lb (98.4 kg)  04/29/24 217 lb (98.4 kg)  04/13/24 169 lb 12.1 oz (77 kg)    GEN: conversant, tremors CARDIAC: irregular rhythm, rate controlled, no murmurs, rubs, gallops RESPIRATORY:  Clear to auscultation without rales, wheezing or rhonchi  ABDOMEN: Soft, non-tender, non-distended EXTREMITIES:  No edema; No deformity   ASSESSMENT AND PLAN Erik [name] is a 84 y.o. male with Parkinson's disease, persistent AF and HTN who presents for arrhythmia management.  Assessment & Plan Parkinson's disease Chronic Parkinson's with tremors and weakness, managed effectively with carbidopa . - Continue carbidopa  for Parkinson's disease management.  Atrial fibrillation with rate control therapy Atrial fibrillation managed with digoxin  post-COVID, heart rate well-controlled. Discussed digoxin  toxicity risks due  to kidney function changes. - Continue digoxin  for rate control. - Monitor for symptoms of digoxin  toxicity, such as nausea or visual changes. - Inform emergency department staff of digoxin  use if hospitalization is required.  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  Chronic kidney disease Continue to monitor kidney function and adjust medications as necessary.  Dispo: RTC 1 yr  Signed, Donnice DELENA Primus, MD  "

## 2024-07-11 ENCOUNTER — Ambulatory Visit: Admitting: Physician Assistant
